# Patient Record
Sex: Female | Born: 1935 | Race: White | Hispanic: No | Smoking: Former smoker
Health system: Southern US, Community
[De-identification: ages and names within clinical notes are randomized; demographics above are authoritative.]

## PROBLEM LIST (undated history)

## (undated) DIAGNOSIS — Z789 Other specified health status: Secondary | ICD-10-CM

## (undated) DIAGNOSIS — I2510 Atherosclerotic heart disease of native coronary artery without angina pectoris: Secondary | ICD-10-CM

## (undated) DIAGNOSIS — M5135 Other intervertebral disc degeneration, thoracolumbar region: Secondary | ICD-10-CM

## (undated) DIAGNOSIS — T82855A Stenosis of coronary artery stent, initial encounter: Secondary | ICD-10-CM

## (undated) DIAGNOSIS — T4145XA Adverse effect of unspecified anesthetic, initial encounter: Secondary | ICD-10-CM

## (undated) DIAGNOSIS — J45909 Unspecified asthma, uncomplicated: Secondary | ICD-10-CM

## (undated) DIAGNOSIS — K648 Other hemorrhoids: Secondary | ICD-10-CM

## (undated) DIAGNOSIS — M1990 Unspecified osteoarthritis, unspecified site: Secondary | ICD-10-CM

## (undated) DIAGNOSIS — C189 Malignant neoplasm of colon, unspecified: Secondary | ICD-10-CM

## (undated) DIAGNOSIS — K5790 Diverticulosis of intestine, part unspecified, without perforation or abscess without bleeding: Secondary | ICD-10-CM

## (undated) DIAGNOSIS — E785 Hyperlipidemia, unspecified: Secondary | ICD-10-CM

## (undated) DIAGNOSIS — M810 Age-related osteoporosis without current pathological fracture: Secondary | ICD-10-CM

## (undated) DIAGNOSIS — R918 Other nonspecific abnormal finding of lung field: Secondary | ICD-10-CM

## (undated) DIAGNOSIS — D126 Benign neoplasm of colon, unspecified: Secondary | ICD-10-CM

## (undated) DIAGNOSIS — I10 Essential (primary) hypertension: Secondary | ICD-10-CM

## (undated) DIAGNOSIS — Z9861 Coronary angioplasty status: Secondary | ICD-10-CM

## (undated) DIAGNOSIS — I2121 ST elevation (STEMI) myocardial infarction involving left circumflex coronary artery: Secondary | ICD-10-CM

## (undated) DIAGNOSIS — D500 Iron deficiency anemia secondary to blood loss (chronic): Secondary | ICD-10-CM

## (undated) DIAGNOSIS — IMO0001 Reserved for inherently not codable concepts without codable children: Secondary | ICD-10-CM

## (undated) HISTORY — DX: Diverticulosis of intestine, part unspecified, without perforation or abscess without bleeding: K57.90

## (undated) HISTORY — DX: Other hemorrhoids: K64.8

## (undated) HISTORY — DX: Other specified health status: Z78.9

## (undated) HISTORY — DX: Other nonspecific abnormal finding of lung field: R91.8

## (undated) HISTORY — PX: CHOLECYSTECTOMY: SHX55

## (undated) HISTORY — DX: Atherosclerotic heart disease of native coronary artery without angina pectoris: I25.10

## (undated) HISTORY — PX: APPENDECTOMY: SHX54

## (undated) HISTORY — DX: Iron deficiency anemia secondary to blood loss (chronic): D50.0

## (undated) HISTORY — DX: Reserved for inherently not codable concepts without codable children: IMO0001

## (undated) HISTORY — DX: Stenosis of coronary artery stent, initial encounter: T82.855A

## (undated) HISTORY — DX: Unspecified osteoarthritis, unspecified site: M19.90

## (undated) HISTORY — DX: Hyperlipidemia, unspecified: E78.5

## (undated) HISTORY — DX: Atherosclerotic heart disease of native coronary artery without angina pectoris: Z98.61

## (undated) HISTORY — DX: ST elevation (STEMI) myocardial infarction involving left circumflex coronary artery: I21.21

## (undated) HISTORY — DX: Unspecified asthma, uncomplicated: J45.909

## (undated) HISTORY — PX: CORONARY ANGIOPLASTY WITH STENT PLACEMENT: SHX49

## (undated) HISTORY — DX: Malignant neoplasm of colon, unspecified: C18.9

## (undated) HISTORY — DX: Benign neoplasm of colon, unspecified: D12.6

## (undated) HISTORY — DX: Age-related osteoporosis without current pathological fracture: M81.0

## (undated) HISTORY — DX: Essential (primary) hypertension: I10

## (undated) HISTORY — DX: Other intervertebral disc degeneration, thoracolumbar region: M51.35

---

## 1998-02-03 ENCOUNTER — Other Ambulatory Visit: Admission: RE | Admit: 1998-02-03 | Discharge: 1998-02-03 | Payer: Self-pay | Admitting: Gynecology

## 1998-07-22 ENCOUNTER — Ambulatory Visit (HOSPITAL_BASED_OUTPATIENT_CLINIC_OR_DEPARTMENT_OTHER): Admission: RE | Admit: 1998-07-22 | Discharge: 1998-07-22 | Payer: Self-pay | Admitting: Orthopedic Surgery

## 1998-08-02 ENCOUNTER — Encounter: Admission: RE | Admit: 1998-08-02 | Discharge: 1998-10-31 | Payer: Self-pay | Admitting: Orthopedic Surgery

## 1999-02-16 ENCOUNTER — Other Ambulatory Visit: Admission: RE | Admit: 1999-02-16 | Discharge: 1999-02-16 | Payer: Self-pay | Admitting: Obstetrics & Gynecology

## 1999-02-16 ENCOUNTER — Encounter (INDEPENDENT_AMBULATORY_CARE_PROVIDER_SITE_OTHER): Payer: Self-pay | Admitting: Specialist

## 1999-03-03 ENCOUNTER — Other Ambulatory Visit: Admission: RE | Admit: 1999-03-03 | Discharge: 1999-03-03 | Payer: Self-pay | Admitting: Gastroenterology

## 1999-03-03 ENCOUNTER — Encounter (INDEPENDENT_AMBULATORY_CARE_PROVIDER_SITE_OTHER): Payer: Self-pay

## 1999-04-25 ENCOUNTER — Encounter: Admission: RE | Admit: 1999-04-25 | Discharge: 1999-04-25 | Payer: Self-pay | Admitting: Orthopedic Surgery

## 1999-04-25 ENCOUNTER — Encounter: Payer: Self-pay | Admitting: Orthopedic Surgery

## 1999-04-26 ENCOUNTER — Ambulatory Visit (HOSPITAL_BASED_OUTPATIENT_CLINIC_OR_DEPARTMENT_OTHER): Admission: RE | Admit: 1999-04-26 | Discharge: 1999-04-26 | Payer: Self-pay | Admitting: Orthopedic Surgery

## 1999-06-05 ENCOUNTER — Encounter: Admission: RE | Admit: 1999-06-05 | Discharge: 1999-06-21 | Payer: Self-pay | Admitting: Orthopedic Surgery

## 2000-03-06 ENCOUNTER — Other Ambulatory Visit: Admission: RE | Admit: 2000-03-06 | Discharge: 2000-03-06 | Payer: Self-pay | Admitting: Obstetrics & Gynecology

## 2001-01-01 ENCOUNTER — Inpatient Hospital Stay (HOSPITAL_COMMUNITY): Admission: EM | Admit: 2001-01-01 | Discharge: 2001-01-03 | Payer: Self-pay | Admitting: Emergency Medicine

## 2001-01-01 ENCOUNTER — Encounter: Payer: Self-pay | Admitting: Emergency Medicine

## 2001-01-06 ENCOUNTER — Inpatient Hospital Stay (HOSPITAL_COMMUNITY): Admission: EM | Admit: 2001-01-06 | Discharge: 2001-01-09 | Payer: Self-pay | Admitting: Emergency Medicine

## 2001-01-06 ENCOUNTER — Encounter: Payer: Self-pay | Admitting: Emergency Medicine

## 2001-03-02 ENCOUNTER — Inpatient Hospital Stay (HOSPITAL_COMMUNITY): Admission: EM | Admit: 2001-03-02 | Discharge: 2001-03-05 | Payer: Self-pay | Admitting: Emergency Medicine

## 2001-03-02 ENCOUNTER — Encounter: Payer: Self-pay | Admitting: Emergency Medicine

## 2001-03-02 DIAGNOSIS — I2121 ST elevation (STEMI) myocardial infarction involving left circumflex coronary artery: Secondary | ICD-10-CM

## 2001-03-02 HISTORY — DX: ST elevation (STEMI) myocardial infarction involving left circumflex coronary artery: I21.21

## 2001-03-04 ENCOUNTER — Encounter: Payer: Self-pay | Admitting: Cardiology

## 2001-04-17 ENCOUNTER — Encounter: Payer: Self-pay | Admitting: Family Medicine

## 2001-04-17 ENCOUNTER — Encounter: Admission: RE | Admit: 2001-04-17 | Discharge: 2001-04-17 | Payer: Self-pay | Admitting: Family Medicine

## 2001-07-02 HISTORY — PX: REPLACEMENT TOTAL KNEE: SUR1224

## 2001-07-02 HISTORY — PX: TOTAL HIP ARTHROPLASTY: SHX124

## 2001-07-21 ENCOUNTER — Encounter: Payer: Self-pay | Admitting: Orthopedic Surgery

## 2001-07-21 ENCOUNTER — Inpatient Hospital Stay (HOSPITAL_COMMUNITY): Admission: RE | Admit: 2001-07-21 | Discharge: 2001-07-25 | Payer: Self-pay | Admitting: Orthopedic Surgery

## 2002-02-02 ENCOUNTER — Inpatient Hospital Stay (HOSPITAL_COMMUNITY): Admission: RE | Admit: 2002-02-02 | Discharge: 2002-02-06 | Payer: Self-pay | Admitting: Orthopedic Surgery

## 2002-05-02 DIAGNOSIS — T82855A Stenosis of coronary artery stent, initial encounter: Secondary | ICD-10-CM

## 2002-05-02 HISTORY — DX: Stenosis of coronary artery stent, initial encounter: T82.855A

## 2002-05-22 ENCOUNTER — Ambulatory Visit (HOSPITAL_COMMUNITY): Admission: RE | Admit: 2002-05-22 | Discharge: 2002-05-23 | Payer: Self-pay | Admitting: Cardiology

## 2002-05-22 ENCOUNTER — Encounter: Payer: Self-pay | Admitting: Cardiology

## 2002-10-20 ENCOUNTER — Ambulatory Visit (HOSPITAL_COMMUNITY): Admission: RE | Admit: 2002-10-20 | Discharge: 2002-10-20 | Payer: Self-pay | Admitting: Cardiology

## 2003-06-22 ENCOUNTER — Other Ambulatory Visit: Admission: RE | Admit: 2003-06-22 | Discharge: 2003-06-22 | Payer: Self-pay | Admitting: Obstetrics & Gynecology

## 2003-07-08 ENCOUNTER — Emergency Department (HOSPITAL_COMMUNITY): Admission: EM | Admit: 2003-07-08 | Discharge: 2003-07-08 | Payer: Self-pay

## 2003-11-09 ENCOUNTER — Emergency Department (HOSPITAL_COMMUNITY): Admission: EM | Admit: 2003-11-09 | Discharge: 2003-11-09 | Payer: Self-pay | Admitting: Family Medicine

## 2003-12-24 ENCOUNTER — Emergency Department (HOSPITAL_COMMUNITY): Admission: EM | Admit: 2003-12-24 | Discharge: 2003-12-24 | Payer: Self-pay | Admitting: Emergency Medicine

## 2004-01-18 ENCOUNTER — Emergency Department (HOSPITAL_COMMUNITY): Admission: EM | Admit: 2004-01-18 | Discharge: 2004-01-18 | Payer: Self-pay | Admitting: *Deleted

## 2005-03-29 ENCOUNTER — Ambulatory Visit: Payer: Self-pay | Admitting: Gastroenterology

## 2005-04-17 ENCOUNTER — Ambulatory Visit: Payer: Self-pay | Admitting: Gastroenterology

## 2005-04-17 ENCOUNTER — Encounter (INDEPENDENT_AMBULATORY_CARE_PROVIDER_SITE_OTHER): Payer: Self-pay | Admitting: *Deleted

## 2005-06-26 ENCOUNTER — Other Ambulatory Visit: Admission: RE | Admit: 2005-06-26 | Discharge: 2005-06-26 | Payer: Self-pay | Admitting: Obstetrics & Gynecology

## 2006-02-23 ENCOUNTER — Emergency Department (HOSPITAL_COMMUNITY): Admission: EM | Admit: 2006-02-23 | Discharge: 2006-02-23 | Payer: Self-pay | Admitting: Family Medicine

## 2007-06-03 ENCOUNTER — Encounter: Admission: RE | Admit: 2007-06-03 | Discharge: 2007-06-03 | Payer: Self-pay | Admitting: Orthopedic Surgery

## 2007-07-03 DIAGNOSIS — T8859XA Other complications of anesthesia, initial encounter: Secondary | ICD-10-CM

## 2007-07-03 HISTORY — DX: Other complications of anesthesia, initial encounter: T88.59XA

## 2007-09-03 ENCOUNTER — Inpatient Hospital Stay (HOSPITAL_COMMUNITY): Admission: RE | Admit: 2007-09-03 | Discharge: 2007-09-07 | Payer: Self-pay | Admitting: Orthopedic Surgery

## 2007-09-08 HISTORY — PX: TOTAL HIP ARTHROPLASTY: SHX124

## 2007-11-24 ENCOUNTER — Emergency Department (HOSPITAL_COMMUNITY): Admission: EM | Admit: 2007-11-24 | Discharge: 2007-11-24 | Payer: Self-pay | Admitting: Emergency Medicine

## 2008-06-01 ENCOUNTER — Ambulatory Visit: Payer: Self-pay | Admitting: Internal Medicine

## 2008-06-03 ENCOUNTER — Emergency Department (HOSPITAL_COMMUNITY): Admission: EM | Admit: 2008-06-03 | Discharge: 2008-06-03 | Payer: Self-pay | Admitting: Emergency Medicine

## 2008-06-15 ENCOUNTER — Encounter: Payer: Self-pay | Admitting: Internal Medicine

## 2008-06-15 ENCOUNTER — Ambulatory Visit: Payer: Self-pay | Admitting: Internal Medicine

## 2008-06-15 HISTORY — PX: COLONOSCOPY W/ BIOPSIES AND POLYPECTOMY: SHX1376

## 2008-06-18 ENCOUNTER — Encounter: Payer: Self-pay | Admitting: Internal Medicine

## 2008-06-27 ENCOUNTER — Emergency Department (HOSPITAL_COMMUNITY): Admission: EM | Admit: 2008-06-27 | Discharge: 2008-06-27 | Payer: Self-pay | Admitting: Emergency Medicine

## 2009-11-22 ENCOUNTER — Telehealth: Payer: Self-pay | Admitting: Internal Medicine

## 2009-11-29 ENCOUNTER — Ambulatory Visit: Payer: Self-pay | Admitting: Gastroenterology

## 2009-11-29 ENCOUNTER — Telehealth: Payer: Self-pay | Admitting: Internal Medicine

## 2009-11-29 DIAGNOSIS — K5900 Constipation, unspecified: Secondary | ICD-10-CM | POA: Insufficient documentation

## 2009-11-29 DIAGNOSIS — Z860100 Personal history of colon polyps, unspecified: Secondary | ICD-10-CM | POA: Insufficient documentation

## 2009-11-30 LAB — CONVERTED CEMR LAB
Basophils Absolute: 0 10*3/uL (ref 0.0–0.1)
Basophils Relative: 0.4 % (ref 0.0–3.0)
Eosinophils Absolute: 0.2 10*3/uL (ref 0.0–0.7)
HCT: 36.2 % (ref 36.0–46.0)
Hemoglobin: 12.8 g/dL (ref 12.0–15.0)
Lymphs Abs: 2.2 10*3/uL (ref 0.7–4.0)
MCHC: 35.3 g/dL (ref 30.0–36.0)
MCV: 92 fL (ref 78.0–100.0)
Neutro Abs: 5.9 10*3/uL (ref 1.4–7.7)
RBC: 3.93 M/uL (ref 3.87–5.11)
RDW: 13.1 % (ref 11.5–14.6)

## 2009-12-06 ENCOUNTER — Telehealth: Payer: Self-pay | Admitting: Internal Medicine

## 2009-12-06 ENCOUNTER — Ambulatory Visit: Payer: Self-pay | Admitting: Internal Medicine

## 2009-12-06 LAB — CONVERTED CEMR LAB
BUN: 16 mg/dL (ref 6–23)
Basophils Relative: 0.1 % (ref 0.0–3.0)
Calcium: 10.3 mg/dL (ref 8.4–10.5)
Chloride: 101 meq/L (ref 96–112)
Creatinine, Ser: 1.1 mg/dL (ref 0.4–1.2)
Eosinophils Absolute: 0.2 10*3/uL (ref 0.0–0.7)
Eosinophils Relative: 1.5 % (ref 0.0–5.0)
Lymphocytes Relative: 19.1 % (ref 12.0–46.0)
MCHC: 35 g/dL (ref 30.0–36.0)
MCV: 91.7 fL (ref 78.0–100.0)
Monocytes Absolute: 0.7 10*3/uL (ref 0.1–1.0)
Neutrophils Relative %: 73.3 % (ref 43.0–77.0)
Platelets: 314 10*3/uL (ref 150.0–400.0)
RBC: 4.09 M/uL (ref 3.87–5.11)
WBC: 11.4 10*3/uL — ABNORMAL HIGH (ref 4.5–10.5)

## 2009-12-07 ENCOUNTER — Ambulatory Visit: Payer: Self-pay | Admitting: Cardiovascular Disease

## 2010-07-23 ENCOUNTER — Encounter: Payer: Self-pay | Admitting: Internal Medicine

## 2010-08-03 NOTE — Progress Notes (Signed)
Summary: Triage  Phone Note Call from Patient Call back at Home Phone [phone]   Caller: Patient Call For: Dr. Leone Payor Reason for Call: Talk to Nurse Summary of Call: pt. feels like she has a "blocked intestine"...complete constipation Initial call taken by: Karna Christmas,  Nov 29, 2009 9:22 AM  Follow-up for Phone Call        Patient  with increasing constipation and doesn't fel she can wait to see Dr Leone Payor on 12/26/09.  She also has worsening LLQ abdominal pain.  Patient  will come in today and see Amy Esterwood PA toay at 2:30. Follow-up by: Darcey Nora RN, CGRN,  Nov 29, 2009 9:38 AM

## 2010-08-03 NOTE — Assessment & Plan Note (Signed)
Summary: continued abdominal pain/diverticulitis/sheri   History of Present Illness Visit Type: Follow-up Visit Primary GI MD: Stan Head MD [facility] Primary Provider: Merlene Laughter MD Chief Complaint: constipation, pt has been taking Miralax and she is still unable to have a good BM History of Present Illness:   77 Y.O FEMALE KNOWN TO DR. Leone Payor WHO WAS SEEN ON 11/29/09 WITH C/O LLQ PAIN AND OBSTIPATION. SHE HAD A COLONOSCOPY IN 2009 THAT SHOWED SEVERE SIGMOID DIVERTICULOSIS,AND 3 SMALL ADENMATOUS POLYPS. SHE WAS  FELT CLINICALLY TO HAVE DIVERTICULITIS, AND WAS STARTED ON CIPRO AND FLAGYL ,AS WELL AS MIRALAX DAILY. CBC WAS NORMAL. SHE CALLED BACK TODAY STATING THAT SHE DOES NOT FEEL ANY BETTER. SHE IS STILL HHURTING IN HER LOWER ABDOMEN-PRIMARILY LEFT SIDED. SHE IS PASSING SOME SMALL VOLUME MUSHY STOOL BUT STILL FEELS SHE IS CONSTIPATED. SHE SAYS SHE FEELS MISERABLE IN THE MORNINGS,WITH NAUSEA AND URGE FOR BM-THEN JUST PASSING SMALL AMTS. NO FEVER, SWEATS. SHE IS TAKING HER ABX.SHE IS ALSO HAVING SOME LEFT BACK PAIN.   GI Review of Systems    Reports abdominal pain, bloating, and  loss of appetite.     Location of  Abdominal pain: LLQ.    Denies acid reflux, belching, chest pain, dysphagia with liquids, dysphagia with solids, heartburn, nausea, vomiting, vomiting blood, and  weight loss.      Reports change in bowel habits, constipation, and  diverticulosis.     Denies anal fissure, black tarry stools, fecal incontinence, heme positive stool, hemorrhoids, irritable bowel syndrome, jaundice, light color stool, liver problems, rectal bleeding, and  rectal pain.    Current Medications (verified): 1)  Fexofenadine Hcl 180 Mg Tabs (Fexofenadine Hcl) .[name] Kitchen.. 1 By Mouth Once Daily 2)  Lovastatin 20 Mg Tabs (Lovastatin) .[name] Kitchen.. 1 By Mouth Once Daily 3)  Losartan Potassium 25 Mg Tabs (Losartan Potassium) .[name] Kitchen.. 1 By Mouth Once Daily 4)  Metoprolol Tartrate 50 Mg Tabs (Metoprolol Tartrate) .[name] Kitchen.. 1 By Mouth Once  Daily 5)  Triamterene-Hctz 37.5-25 Mg Tabs (Triamterene-Hctz) .[name] Kitchen.. 1 By Mouth Once Daily 6)  Alendronate Sodium 70 Mg Tabs (Alendronate Sodium) .[name] Kitchen.. 1 By Mouth Per Week 7)  Proventil Hfa 108 (90 Base) Mcg/act Aers (Albuterol Sulfate) .... As Needed 8)  Aspir-Low 81 Mg Tbec (Aspirin) .[name] Kitchen.. 1 By Mouth Once Daily 9)  Calcium Gluconate 500 Mg Tabs (Calcium Gluconate) .... 2 By Mouth Once Daily 10)  Fish Oil 1200 Mg Caps (Omega-3 Fatty Acids) .[name] Kitchen.. 1 By Mouth Once Daily 11)  Tylenol 325 Mg Tabs (Acetaminophen) .... As Needed 12)  Cipro 500 Mg Tabs (Ciprofloxacin Hcl) .... Take 1 Tab Twice Daily X 10 Days 13)  Flagyl 500 Mg Tabs (Metronidazole) .... Take 1 Tab Twice Daily X 10 Days  Allergies (verified): 1)  ! Plavix (Clopidogrel Bisulfate) 2)  ! Lipitor 3)  Sulfa 4)  Codeine  Past History:  Past Medical History: Reviewed history from 11/29/2009 and no changes required. Arthritis Asthma Coronary Artery Disease ADENOMATOUS COLON POLYPS DIVERTICULOSIS Hyperlipidemia Obesity  Past Surgical History: Reviewed history from 11/29/2009 and no changes required. Appendectomy Cholecystectomy PTCA-Stent  Family History: Reviewed history from 11/29/2009 and no changes required. No FH of Colon Cancer: Family History of Pancreatic Cancer: father brothers  Family History of Heart Disease: mother sister  Social History: Reviewed history from 11/29/2009 and no changes required. Patient is a former smoker.  Alcohol Use - yes 1-2 per month Daily Caffeine Use 1-2 per day Illicit Drug Use - no Patient does not get regular exercise.   Review  of Systems       The patient complains of back pain.  The patient denies allergy/sinus, anemia, anxiety-new, arthritis/joint pain, blood in urine, breast changes/lumps, change in vision, confusion, cough, coughing up blood, depression-new, fainting, fatigue, fever, headaches-new, hearing problems, heart murmur, heart rhythm changes, itching, menstrual pain,  muscle pains/cramps, nosebleeds, pregnancy symptoms, shortness of breath, sore throat, swelling of feet/legs, swollen lymph glands, thirst - excessive, urination - excessive, and urination changes/pain.         OTHERWISE AS IN HPI  Vital Signs:  Patient profile:   77 year old female Height:      60 inches Weight:      178 pounds BMI:     34.89 Pulse rate:   88 / minute Pulse rhythm:   regular BP sitting:   120 / 72  (left arm) Cuff size:   regular  Vitals Entered By: Francee Piccolo CMA Duncan Dull) (December 06, 2009 2:15 PM)  Physical Exam  General:  Well developed, well nourished, no acute distress. Head:  Normocephalic and atraumatic. Eyes:  PERRLA, no icterus. Lungs:  Clear throughout to auscultation. Heart:  Regular rate and rhythm; no murmurs, rubs,  or bruits. Abdomen:  SOFT, TENDER LLQ AND SUPRAPUBIC AREA, NO MASS NO GUARDING, BS+ Rectal:  NOT REPEATED Extremities:  No clubbing, cyanosis, edema or deformities noted. Neurologic:  Alert and  oriented x4;  grossly normal neurologically. Psych:  Alert and cooperative. Normal mood and affect.   Impression & Recommendations:  Problem # 1:  DIVERTICULITIS, COLON (ICD-562.11) Assessment Unchanged 77 YO FEMALE WITH PERSISTENT LLQ PAIN, AND CONSTIPATION AFTER ONE WEEK TREATMENT FOR DIVERTICULITIS WITH FLAGYL/CIPRO AND DAILY MIRALAX. R/O REFRACTORY DIVERTICULITIS,COMPLICATED DIVERTICULITIS,OR OTHER INFLAMMATORY PROCESS.   SCHEDULE FOR CT SCAN ABD/PELVIS FINISH CURRENT COURSE OF CIPRO AND FLAGYL X 3 MORE DAYS INCREASE MIRALAX TO 17 GM TWICE DAILY IN 8 OZ WATER CBC/BMET TODAY FOLLOW UP WITH DR. Leone Payor  AS PREVIOUSLY SCHEDULED. Orders: TLB-BMP (Basic Metabolic Panel-BMET) (80048-METABOL)  Problem # 2:  HYPERTENSION (ICD-401.9) Assessment: Comment Only  Problem # 3:  PERSONAL HX COLONIC POLYPS (ICD-V12.72) Assessment: Comment Only LAST COLON 2009-DUE FOR FOLLOW UP 2012  Problem # 4:  CORONARY ARTERY DISEASE  (ICD-414.00) Assessment: Comment Only  Other Orders: TLB-CBC Platelet - w/Differential (85025-CBCD) CT Abdomen/Pelvis with Contrast (CT Abd/Pelvis w/con)  Patient Instructions: 1)  Please go to lab, basement level. 2)  We scheduled the CT Scan at [facility] for tomorrow 12-07-09 at [facility] [address]... 3)  Directions and contrast given. 4)  Continue the Cipro and Flagyl.  5)  Increase the Mirilax to  1 dose twice daily. 6)  Copy sent to : Merlene Laughter, MD 7)  The medication list was reviewed and reconciled.  All changed / newly prescribed medications were explained.  A complete medication list was provided to the patient / caregiver.

## 2010-08-03 NOTE — Assessment & Plan Note (Signed)
Summary: constipation/abdominal pain/sheri   History of Present Illness Visit Type: Initial Visit Primary GI MD: Stan Head MD [facility] Primary Provider: Merlene Laughter MD Chief Complaint: constipation abd pain History of Present Illness:   PLEASANT 77 Y.O FEMALE KNOWN TO DR. Leone Payor WHO HAD A COLONOSCOPY IN 2009. THIS SHOWED SEVERE SIGMOID DIVERTICULOSIS, 3 SMALL ADENOMATOUS POLYPS. SHE COMES IN TODAY WITH NEW C/O CONSTIPATION OVER THE PAST COUPLE MONTHS,NOW TO THE POINT SHE HAS NOT REALLY HAD A NORMAL BM IN OVER A WEEK.SHE IS PASSING SOME LIQUID SQUIRTS OF STOOL. SHE BEGAN WITH LEFT LOWR ABDOMINAL PAIN ABOUT A WEEK AGO WHICH HAS BEEN CONSTANT,WORSE WITH STRAINING FOR A BM,EASES OFF WITH FLATUS ETC. NO FEVER, NO MELENA OR HEME. NO URINARY SXS. SHE HAS TRIED A COUPLE LAXATIVES THIS LAST WEEK WITHOUT SUCCESS.NO N/V.    GI Review of Systems    Reports abdominal pain.     Location of  Abdominal pain: LLQ.    Denies acid reflux, belching, bloating, chest pain, dysphagia with liquids, dysphagia with solids, heartburn, loss of appetite, nausea, vomiting, vomiting blood, and  weight loss.        Denies anal fissure, black tarry stools, change in bowel habit, constipation, diarrhea, diverticulosis, fecal incontinence, heme positive stool, hemorrhoids, irritable bowel syndrome, jaundice, light color stool, liver problems, rectal bleeding, and  rectal pain.    Current Medications (verified): 1)  Fexofenadine Hcl 180 Mg Tabs (Fexofenadine Hcl) .[name] Kitchen.. 1 By Mouth Once Daily 2)  Lovastatin 20 Mg Tabs (Lovastatin) .[name] Kitchen.. 1 By Mouth Once Daily 3)  Losartan Potassium 25 Mg Tabs (Losartan Potassium) .[name] Kitchen.. 1 By Mouth Once Daily 4)  Metoprolol Tartrate 50 Mg Tabs (Metoprolol Tartrate) .[name] Kitchen.. 1 By Mouth Once Daily 5)  Triamterene-Hctz 37.5-25 Mg Tabs (Triamterene-Hctz) .[name] Kitchen.. 1 By Mouth Once Daily 6)  Alendronate Sodium 70 Mg Tabs (Alendronate Sodium) .[name] Kitchen.. 1 By Mouth Per Week 7)  Proventil Hfa 108 (90 Base) Mcg/act Aers  (Albuterol Sulfate) .... As Needed 8)  Aspir-Low 81 Mg Tbec (Aspirin) .[name] Kitchen.. 1 By Mouth Once Daily 9)  Calcium Gluconate 500 Mg Tabs (Calcium Gluconate) .... 2 By Mouth Once Daily 10)  Fish Oil 1200 Mg Caps (Omega-3 Fatty Acids) .[name] Kitchen.. 1 By Mouth Once Daily 11)  Tylenol 325 Mg Tabs (Acetaminophen) .... As Needed  Allergies (verified): 1)  Sulfa 2)  Codeine  Past History:  Past Medical History: Arthritis Asthma Coronary Artery Disease ADENOMATOUS COLON POLYPS DIVERTICULOSIS Hyperlipidemia Obesity  Past Surgical History: Appendectomy Cholecystectomy PTCA-Stent  Family History: No FH of Colon Cancer: Family History of Pancreatic Cancer: father brothers  Family History of Heart Disease: mother sister  Social History: Patient is a former smoker.  Alcohol Use - yes 1-2 per month Daily Caffeine Use 1-2 per day Illicit Drug Use - no Patient does not get regular exercise.  Smoking Status:  quit Drug Use:  no Does Patient Exercise:  no  Review of Systems  The patient denies allergy/sinus, anemia, anxiety-new, arthritis/joint pain, back pain, blood in urine, breast changes/lumps, cough, coughing up blood, depression-new, fainting, fatigue, fever, headaches-new, hearing problems, heart murmur, heart rhythm changes, itching, nosebleeds, pregnancy symptoms, shortness of breath, skin rash, sleeping problems, sore throat, swelling of feet/legs, swollen lymph glands, thirst - excessive, urination - excessive, urination changes/pain, urine leakage, vision changes, and voice change.         ROS OTHERWISE AS IN HPI  Vital Signs:  Patient profile:   77 year old female Height:      60 inches Weight:  180 pounds BMI:     35.28 Pulse rate:   68 / minute Pulse rhythm:   regular BP sitting:   110 / 60  (right arm)  Vitals Entered By: Chales Abrahams CMA Duncan Dull) (Nov 29, 2009 2:09 PM)  Physical Exam  General:  Well developed, well nourished, no acute distress. Head:  Normocephalic and  atraumatic. Eyes:  PERRLA, no icterus. Lungs:  Clear throughout to auscultation. Heart:  Regular rate and rhythm; no murmurs, rubs,  or bruits.heart murmur systolic:.   Abdomen:  SOFT, TENDER LLQ, NO MASS OR HSM,BS+, NO GUARDING OR REBOUND Rectal:  MINIMAL STOOL IN VAULT,HEME NEGATIVE  Extremities:  No clubbing, cyanosis, edema or deformities noted. Neurologic:  Alert and  oriented x4;  grossly normal neurologically. Psych:  Alert and cooperative. Normal mood and affect.   Impression & Recommendations:  Problem # 1:  DIVERTICULITIS, COLON (ICD-562.11) Assessment New 77 Y.O FEMALE WITH ONE WEEK HX OF LLQ PAIN, AND SEVERAL WEEK HX OF ALTERED BOWEL HABITS WITH CONSTIPATION. SXS ARE CONSISTENT WITH DIVERTICULITIS,R/O SOME COMPONENT OF SIGMOID NARROWING CAUSING CONSTIPATION.  LABS AS BELOW START CIPRO 500 MG TWICE DAILY X 10 DAYS FLAGYL 500 MG TWICE DAILY X 10 DAYS START MIRALAX 17 GM IN 8 OZ OF WATER DAILY ROV WITH DR. Leone Payor IN 2-3 WEEKS. PT ADVISED TO CALL IF SXS WORSEN OR IF NOT RESOLVED  WHEN ABX COMPLETED.  Problem # 2:  PERSONAL HX COLONIC POLYPS (ICD-V12.72) Assessment: Comment Only ADENOMATOUS ON COLONOSCOPY12/09.  Problem # 3:  CORONARY ARTERY DISEASE (ICD-414.00) Assessment: Comment Only  Problem # 4:  HYPERLIPIDEMIA (ICD-272.4) Assessment: Comment Only  Other Orders: TLB-CBC Platelet - w/Differential (85025-CBCD)  Patient Instructions: 1)  Your physician has requested that you have the following labwork done today: Go to basement level. 2)  We sent perscription for Cipro and Flagyl to your pharmacy, [facility]. 3)  Use Miralax, 1 dose daily, 17 grams. 4)  We made you a follow appointment with Dr. Leone Payor for 01-05-10 at 2:30 PM.  5)  cc: Hal Stoneking, Md Prescriptions: FLAGYL 500 MG TABS (METRONIDAZOLE) Take 1 tab twice daily x 10 days  #20 x 0   Entered by:   Lowry Ram NCMA   Authorized by:   Sammuel Cooper PA-c   Signed by:   Lowry Ram NCMA on  11/29/2009   Method used:   Electronically to        [facility] # [phone]* (retail)       [address]       [place], [place]  [address]       Ph: [phone]       Fax: [phone]   RxID:   [id number] CIPRO 500 MG TABS (CIPROFLOXACIN HCL) Take 1 tab twice daily x 10 days  #20 x 0   Entered by:   Lowry Ram NCMA   Authorized by:   Sammuel Cooper PA-c   Signed by:   Lowry Ram NCMA on 11/29/2009   Method used:   Electronically to        [facility] # [phone]* (retail)       [address]       [place], [place]  [address]       Ph: [phone]       Fax: [phone]   RxID:   [id number]

## 2010-08-03 NOTE — Progress Notes (Signed)
Summary: triage  Phone Note Call from Patient Call back at Home Phone [phone]   Caller: Patient Call For: Leone Payor Reason for Call: Talk to Nurse Summary of Call: Patient has flare up diverticulitis  she was seen last week and given meds but she states that her symptoms are just getting worse. Initial call taken by: Tawni Levy,  December 06, 2009 8:10 AM  Follow-up for Phone Call        Patient  having worsening pain , she was treated last week with cipro and flagyl for diverticulitis. This am she has pain radiating down her left leg and around to her back with new nausea.   She was asked to call if her symptoms haven't improved  she  will come in and see Mike Gip PA  at 2:00 today  Follow-up by: Darcey Nora RN, CGRN,  December 06, 2009 9:04 AM  Additional Follow-up for Phone Call Additional follow up Details #1::        ok Additional Follow-up by: Iva Boop MD, Clementeen Graham,  December 06, 2009 1:46 PM

## 2010-08-03 NOTE — Progress Notes (Signed)
Summary: speak ot nurse  Phone Note Call from Patient Call back at Home Phone [phone]   Caller: Patient Call For: Leone Payor Reason for Call: Talk to Nurse Summary of Call: Patient would like to speak to nurse regarding problems she is having with her stools. Initial call taken by: Tawni Levy,  Nov 22, 2009 3:20 PM  Follow-up for Phone Call        6 month hx of change in bowel habits, thin stools.  I have rescheduled patient to 12/26/09 2:30 Follow-up by: Darcey Nora RN, CGRN,  Nov 22, 2009 3:47 PM

## 2010-11-14 NOTE — Op Note (Signed)
[name], [name]                ACCOUNT NO.:  [id number]   MEDICAL RECORD NO.:  [id number]          PATIENT TYPE:  INP   LOCATION:  0008                         FACILITY:  [facility]   PHYSICIAN:  Ollen Gross, M.D.    DATE OF BIRTH:  01/18/1928   DATE OF PROCEDURE:  09/03/2007  DATE OF DISCHARGE:                               OPERATIVE REPORT   PREOPERATIVE DIAGNOSIS:  Osteoarthritis, left hip.   POSTOPERATIVE DIAGNOSIS:  Osteoarthritis, left hip.   PROCEDURE:  Left total hip arthroplasty.   SURGEON:  Ollen Gross, M.D.   ASSISTANT:  Avel Peace, PA-C   ANESTHESIA:  General.   ESTIMATED BLOOD LOSS:  400 mL.   DRAIN:  Hemovac times one.   COMPLICATIONS:  None.   CONDITION:  Stable to recovery.   BRIEF CLINICAL NOTE:  [name] is a 77 year old female with end-stage  arthritis of the right hip with progressively worsening pain and  dysfunction.  She has failed nonoperative management and presents for  total hip arthroplasty.  She had a previous successful right total hip.   PROCEDURE IN DETAIL:  After successful administration of general  anesthetic, the patient was placed the right lateral decubitus position  with the left side up and held with the hip positioner.  Her left lower  extremity was isolated from perineum with plastic drapes and prepped and  draped in the usual sterile fashion.  Short posterolateral incision is  made with 10 blade through subcutaneous tissue to the level of fascia  lata which was incised in line with the skin incision.  Sciatic nerve  was palpated and protected and short external rotators isolated off the  femur.  Capsulectomy is performed and the hip is dislocated.  Center of  femoral head is marked and trial prosthesis placed such that the center  of the trial head corresponds to center of native femoral head.  Osteotomy lines marked on the femoral neck and osteotomy made with  oscillating saw.  Femoral head removed and the femur  retracted  anteriorly to gain acetabular exposure.   Acetabular retractors were placed.  Labrum and osteophytes removed.  Reaming starts at 43 mm coursing in increments of 2 to 49 mm and a 50 mm  pinnacle acetabular shell was placed in anatomic position and transfixed  with two dome screws.  The trial 32-mm neutral +4 liner was placed.   The femur was prepared with canal finder and irrigation.  Axial reaming  is performed at 13.5 mm, proximal reaming to 18D and the sleeve machined  to a small.  18 D small trial sleeve is placed with 18 x 13 stem and 36  +8 neck matching native anteversion.  32.0 head is placed and the hip is  reduced with outstanding stability.  There is full extension, full  external rotation, 70 degrees flexion, 40 degrees adduction 90 degrees  internal rotation and 90 degrees of flexion and 70 degrees of internal  rotation.  By placing the left leg on top of the right, the leg lengths  were found to be equal.  The hip was then  dislocated and all trials  removed.  Permanent apex hole eliminator is placed and permanent 32 mm  neutral +4 marathon liner is placed in the acetabular shell.  On the  femoral side we placed the permanent 18 D small sleeve and the 18 x 13  stem and 36 +8 neck matching native anteversion.  32.0 head is placed  and the hip is reduced to the same stability parameters.  Wound was  copiously irrigated with saline solution and the short external rotators  reattached to the femur through drill holes.  Fascia lata was closed  over Hemovac drain with interrupted #1 Vicryl, subcu closed with #1-0  and #2-0 Vicryl and subcuticular running 4-0 Monocryl.  The drains  hooked to suction.  Incision cleaned and dried and Steri-Strips and  bulky sterile dressing applied.  She was then placed into a knee  immobilizer, awakened and transferred to recovery in stable condition.      Ollen Gross, M.D.  Electronically Signed     FA/MEDQ  D:  09/03/2007  T:   09/04/2007  Job:  [id number]

## 2010-11-17 NOTE — H&P (Signed)
[facility]  Patient:    [name], [name] Visit Number: [id number] MRN: [id number]          Service Type: Attending:  Ollen Gross, M.D. Dictated by:   Dorie Rank, P.A. Adm. Date:  07/21/01   CC:         Valentino Hue. Magrinat, M.D.  Carolyne Fiscal, M.D.  Madaline Savage, M.D.   History and Physical  DATE OF BIRTH:  [birth year]  CHIEF COMPLAINT:  Right hip pain.  HISTORY OF PRESENT ILLNESS:  [name] is a pleasant 77 year old female with a history of severe pain in the right hip and groin for several months.  It radiates down her thigh medially.  She has had progressive difficulty with functional activities over the past several months.  She cannot tie her shoes. She has had difficulty getting in and out of cars and up and down stairs.  She is at a point where she would like to get something done about this right hip pain.  In the office on physical exam it was noted she had an antalgic gait. Range of motion to the hip revealed flexion 85 degrees, full extension, rotation internal 15 degrees, external 30 degrees, and abduction to about 30 degrees.  It was noted she had 3/8-inch shortening on the right lower extremity compared to the left while standing.  Radiographs brought to the office on May 27, 2001, revealed bone-on-bone changes.  AP pelvis taken in the office on May 27, 2001, revealed severe erosive changes to the right hip, bone-on-bone with close to 1/2-inch shortening of the right lower extremity compared to the left.  It was felt due to her ongoing pain, failure to improve with conservative treatment, as well as diagnostic studies and physical exam, she would benefit from undergoing a right total hip arthroplasty.  The risks and benefits as well as the procedure were discussed with the patient, and she agreed to proceed.  She did obtain medical clearance from her cardiologist, Dr. Chanda Busing, as well as Dr.  Darnelle Catalan.  MEDICATIONS: 1. Hydrocodone 5 mg, 1-2 p.o. q.4-6h. p.r.n. pain. 2. Ferrous sulfate 1 p.o. b.i.d. 3. Maxzide 37.5 mg/25, 1 p.o. q.a.m. 4. Allegra 1 p.o. q.d. p.r.n. 5. Caltrate 1200 mg, 1 p.o. q.d. 6. Aspirin 81 mg, 1 p.o. q.d. 7. Vitamin E 400 IU, 1 p.o. q.d. 8. Cozaar 25 mg, 1 p.o. q.a.m. 9. vitamin B 1 p.o. q.d. 10. Atenolol 50 mg, 1 p.o. q.a.m. 11. Vioxx 25 mg, 1 p.o. q.d. 12. Nitroglycerin p.r.n. chest pain.  ALLERGIES:  SULFA, PLAVIX causing itching, LIPITOR causes muscle pain.  PAST MEDICAL HISTORY:  1. History of myocardial infarct in July 2002.  2. Stent placement in September 2002.  3. Stress test in December 2002.  4. History of anemia of chronic disease.  5. Hypertension, which she states is well maintained on her current treatment     regimen.  6. History of asthma, but she states as long as she is on her Allegra, she     has no exacerbations.  FAMILY MEDICAL DOCTOR:  Dr. Mosetta Putt of [place].  CARDIOLOGIST:  Dr. Chanda Busing.  HEMATOLOGIST:  Dr. Darnelle Catalan.  SOCIAL HISTORY:  The patient is married.  She has two children.  She lives in a split-level home.  She would like a private room in the hospital.  She plans for home health physical therapy.  She denies any alcohol or tobacco use.  PAST SURGICAL HISTORY:  1. In 1948,  cholecystectomy.  2. In 1998, bladder tack.  3. In 1999, left shoulder arthroscopy.  4. In 2000, left knee arthroscopy.  FAMILY HISTORY:  Mother deceased age 80, history of heart disease.  Father deceased age 32, history of skin cancer.  REVIEW OF SYSTEMS:  No fevers, chills, night sweats, or bleeding tendencies. PULMONARY:  No shortness of breath, productive cough, or hemoptysis. CARDIOVASCULAR:  No chest pain, angina, or orthopnea.  ENDOCRINE:  No history of hypo- or hyperthyroidism.  No history of diabetes mellitus. GASTROINTESTINAL:  Constipation.  No melena, diarrhea, nausea, or vomiting. GENITOURINARY:  No  hematuria, dysuria, or discharge.  NEUROLOGIC:  No seizures, headaches, or paralysis.  PHYSICAL EXAMINATION:  GENERAL:  Alert and oriented, well-developed, well-nourished white female.  VITAL SIGNS:  Pulse 80, respirations 20, blood pressure 130/70.  HEENT:  Head atraumatic, normocephalic.  Oropharynx clear.  NECK:  Supple.  Negative for carotid bruits bilaterally.  Adenopathy, negative for cervical lymphadenopathy appreciated on exam.  LUNGS:  Clear to auscultation bilaterally.  No wheezes, rhonchi, or rales.  BREASTS:  Not pertinent to present illness.  HEART:  S1, S2.  Negative for murmur, rub, or gallop.  Regular rate and rhythm.  ABDOMEN:  Soft and nontender.  Positive bowel sounds.  Abdomen is round.  GENITOURINARY:  Not pertinent to present illness.  EXTREMITIES:  Please see history of present illness for exam to the right hip. Skin is intact.  No rashes or lesions appreciated on exam.  Dorsalis pedis pulses 1+ and symmetrical.  LABORATORY DATA:  Pending.  IMPRESSION: 1. Osteoarthritis of the right hip. 2. History of myocardial infarction, has been cleared by Dr. Elsie Lincoln for    surgery. 3. History of anemia of chronic disease.  PLAN:  The patient is scheduled for a right total hip arthroplasty by Dr. Ollen Gross. Dictated by:   Dorie Rank, P.A. Attending:  Ollen Gross, M.D. DD:  07/14/01 TD:  07/14/01 Job: [id number]

## 2010-11-17 NOTE — Discharge Summary (Signed)
[place]. [facility]  Patient:    [name], [name] Visit Number: [id number] MRN: [id number]          Service Type: MED Location: [phone] Attending Physician:  Ophelia Shoulder Dictated by:   Raymon Mutton, P.A. Admit Date:  03/02/2001 Discharge Date: 03/05/2001                             Discharge Summary  DATE OF BIRTH:  06-Aug-1927  DISCHARGE DIAGNOSES: 1. Coronary artery disease, status post subendocardial myocardial infarction    on January 01, 2001, treated with cutting balloon angioplasty to obtuse    marginal #2, reduction of lesion from 90% to 0%, and status post    percutaneous transluminal coronary angioplasty and stent to obtuse marginal    #2 on March 04, 2001, by Dr. Jenne Campus. 2. Chronic stable anemia. 3. Statin therapy for stabilization of coronary artery disease. 4. Asthma. 5. Gastritis, status post esophagogastroduodenoscopy in July 2002. 6. Diverticulosis, mild, by colonoscopy from February 20, 2001, performed by Dr.    Corinda Gubler.  MEDICATIONS: 1. Cozaar 25 mg q.d. 2. Ferrous sulfate 325 mg b.i.d. 3. Protonix 40 mg q.d. 4. Lipitor 10 mg q.d. 5. Atenolol 25 mg q.d. 6. Plavix 75 mg q.d.  HISTORY OF PRESENT ILLNESS:  [name] is a 77 year old Caucasian woman with a history of recent SEMI.  She presented to the [facility] Emergency Department with complaints of chest pain and shortness of breath, left arm and left shoulder pain, and the symptoms persisted for 2 or 3 days, but the patient could never distinguish if the pain was related to her left shoulder arthritis and discomfort of that shoulder, or if the pain was truly chest pain.  The night prior to admission, around 10:00, she started having again the left shoulder pain, but this time the pain moved to the left side of the chest and down to the left arm, and she also had shortness of breath and some burning sensation in the chest.  She took one  nitroglycerin with relief of symptoms, and went to bed.  Around 3:00 a.m. she woke up to go to the restroom and experienced the same sensation again.  She took another nitroglycerin, and it helped again.  The patient was alarmed and decided to come to the emergency room for an evaluation.  In the emergency department on presentation, her blood pressure was 147/64, pulse 70, respiratory rate 18, and she was afebrile.  Her neck did not reveal any JVD or carotid bruits.  Her lungs were clear to auscultation.  Heart revealed regular rate and rhythm with normal S1 and S2, and no murmurs, rubs, or gallops.  Abdomen had diminished bowel sounds x 4, nontender, nondistended. Extremities with no edema.  Palpable 1+ dorsalis pedis pulses bilaterally.  LABORATORY DATA:  Hemoglobin of 11.8, hematocrit 33.6.  Potassium was 3.8, creatinine 1.0.  CK was normal at 75, CK-MB was 1.6, troponin was elevated to 0.4.  Noting a history of prior presentation with subendocardial myocardial infarction in July, when she had normal CK, CK-MB, and only troponin was elevated, the decision was made to keep the patient in the hospital and continue observation of her status, and proceed with cardiac catheterization to rule out restenosis of the prior lesion.  Her EKG did not reveal any acute changes.  She was transferred from the emergency room on IV heparin drip and IV  nitroglycerin drip.  HOSPITAL COURSE AND PROCEDURES:  She was admitted to the telemetry unit in stable condition, and her enzymes showed the following results:  Second set CK 67, CK-MB 1.9, troponin 0.4.  Third set showed CK of 56, CK-MB 2.8, and troponin 0.38.  Her liver function tests were within normal limits.  Lipid panel revealed cholesterol 169, triglycerides 135, HDL 65, LDL 77.  TSH was checked and it was 1.248, which is within normal limits.  The patient underwent coronary angioplasty on March 04, 2001, which was performed by Dr. Jenne Campus.   This showed that she had a 95% proximal stenosis of the second obtuse marginal artery.  Cutting balloon angioplasty was performed and it followed by stent placement in the proximal segment of the second obtuse marginal with excellent results.  Normal left ventricular systolic function was observed, and her ejection fraction was at 55%, but there was mild anterior lateral hypokinesis.  The patient tolerated the procedure well, was transferred to the unit in a stable condition.  The next morning she was ready for discharge home, and she was instructed to follow up with Dr. Elsie Lincoln in 2 weeks, and with Dr. Duaine Dredge for assessment of anemia within the next 30 days.  ACTIVITY:  No driving, no lifting greater than 5 pounds, no strenuous physical activity for three days.  She was instructed to return to work in 3 days.  DIET:  Low fat, low cholesterol, low sodium diet.  WOUND CARE:  The patient was instructed that she could shower.  Needs to gently wash groin area with mild soap and pat it dry.  The number was provided for the patient to call with any problems such as bleeding, bruising, oozing, or swelling of the puncture site. Dictated by:   Raymon Mutton, P.A. Attending Physician:  Ophelia Shoulder DD:  03/05/01 TD:  03/05/01 Job: [id number]

## 2010-11-17 NOTE — Cardiovascular Report (Signed)
[place]. [facility]  Patient:    [name], [name]                       MRN: [id number] Proc. Date: 01/06/01 Adm. Date:  [id number] Attending:  Ophelia Shoulder CC:         Cardiac Catheterization Laboratory  Madaline Savage, M.D.  Carolyne Fiscal, M.D.  [facility], [address] [place], [place] [address]   Cardiac Catheterization  PROCEDURES PERFORMED:  Cardiac catheterization.  INDICATIONS:  The patient is a 77 year old, married white female, patient of Dr. Reino Kent, who underwent circumflex marginal PCI, January 01, 2001, for unstable angina.  She was admitted this morning with recurrent chest pain and presents now for diagnostic coronary arteriography to rule out early re-stenosis.  DESCRIPTION OF PROCEDURE:  The patient was brought to the second floor cardiac catheterization lab in the postabsorptive state.  She was premedicated with p.o. Valium.  The right groin was prepped and shaved in the usual sterile fashion.  Xylocaine 1% was used for local anesthesia.  A 6 French sheath was inserted into the right femoral artery using standard Seldinger technique.  A 6 French right and left diagnostic Judkins catheter, as well as a 6 French pigtail catheter were used for selective coronary angiography, left ventriculography, respectively.  Omnipaque dye was used for the entirety of the case.  Retrograde, aortic, left ventricular, and pullback pressures were recorded.  HEMODYNAMICS: 1. Aortic systolic pressure 123, diastolic pressure 60. 2. Left ventricular systolic pressure 121 and diastolic pressure 8.  SELECTIVE CORONARY ANGIOGRAPHY: 1. Left main:  Normal. 2. Left anterior descending:  The LAD is normal. 3. Left circumflex:  The circumflex marginal, previous PTCA site was    found to be widely patent with at most 20% stenosis and no evidence of    dissection.  There is TIMI-3 flow down this vessel. 4. Right  coronary artery:  This vessel was free of significant disease and    was dominant.  LEFT VENTRICULOGRAPHY:  The RAO and LAO left ventriculogram was performed using 20 cc of Omnipaque dye at 10 cc/sec. in each view.  The overall LVEF was estimated greater than 65% without focal wall motion abnormalities.  IMPRESSION:  The patient has essentially normal coronary arteries and normal left ventricular function.  I am unclear of the etiology of her chest pain and/or elevated troponin.  PLAN:  Plans will be to discontinue IV heparin and nitroglycerin.  The sheaths will be removed.  Pressure will be held on the groin to achieve hemostasis. The patient left the lab in stable condition.  She will need GI work-up which is in progress.  Dr. Lavonne Chick was notified of these results. DD:  01/06/01 TD:  01/06/01 Job: [id number]

## 2010-11-17 NOTE — Consult Note (Signed)
[place]. [facility]  Patient:    [name], [name]                       MRN: [id number] Proc. Date: 01/07/01 Adm. Date:  [id number] Disc. Date: [id number] Attending:  Ophelia Shoulder Dictator:   Lorette Ang, N.P. CC:         Carolyne Fiscal, M.D.  Madaline Savage, M.D.   Consultation Report  REASON FOR CONSULTATION:  Anemia.  REFERRING PHYSICIAN:  Dr. Elsie Lincoln.  HISTORY OF PRESENT ILLNESS:  [name] is a 77 year old woman who was admitted to [facility] initially on January 01, 2001, with an MI.  She underwent cardiac catheterization and was found to have small-vessel CAD, status post PTCA.  During that admission she was noted to be anemic with an admission hemoglobin of 10.9, MCV 88.7.  Her stool was also found to be positive for occult blood.  Anemia workup revealed an iron of 49, TIBC of 316, percent saturation 16, ferritin 60, B12 429, and folate of 11.8.  A GI evaluation was planned as an outpatient.  However, prior to the outpatient GI workup the patient was readmitted to [facility] on January 06, 2001, with complaints of shortness of breath and chest pain.  She underwent a second cardiac catheterization which was unrevealing as to the etiology of her chest pain.  Her admission hemoglobin at that time was 10.2.  The GI evaluation was pursued during this admission with an EGD on January 07, 2001, with findings of gastritis without hemorrhage.  The patient may undergo colonoscopy on January 09, 2001, prior to discharge home.  She is currently being transfused two units of packed red blood cells for a hemoglobin of 8.5.  Hematology consult was requested for further evaluation of a normocytic anemia.  PAST MEDICAL HISTORY:  1. MI/single-vessel CAD, status post PTCA July 2002.  2. Hypertension.  3. Asthma.  4. Normocytic anemia July 2002.  5. Osteoarthritis.  6. Seasonal allergies.  7. Bladder surgery in 1999.  8.  Arthroscopic surgery left shoulder and knee.  9. Remote history of appendectomy and cholecystectomy. 10. History of colon polyps by colonoscopy.  MEDICATIONS:  1. Cozaar 25 mg q.d.  2. Toprol XL 25 mg q.d. 3. Plavix 75 mg q.d. 4. Zocor 10 mg q.d. 5. Aspirin 325 mg q.d. 6. Protonix 40 mg q.d. 7. Restoril 15 mg q.h.s. 8. Niferex 150 mg b.i.d.  ALLERGIES:  SULFA.  CODEINE.  FAMILY HISTORY:  Mother deceased age 81 with CHF.  Father deceased age 30 with questionable melanoma.  The patient reports that she has three brothers and two sisters living and that all have "some bowel problems."  She reports one brother and one sister to have IBS and one brother to possibly have Crohns disease status post recent surgery.  She denies any family history of colon cancer.  SOCIAL HISTORY:  Mrs. Coye lives in [place] with her husband.  They have two adopted children, including one son who lives in [place] and is a Curator and one daughter who lives in [place] and is an Advertising account planner.  Both children are healthy.  They have three healthy grandsons.  The patient is retired from Recruitment consultant.  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  HEALTH MAINTENANCE:  Primary care Tanish Sinkler is Dr. Duaine Dredge.  Mammogram 2002, negative.  Pap/pelvic 2001, negative.  Colonoscopy 2-1/2 years ago with findings  of polyps and diverticulosis.  Cholesterol normal.  Flu vaccine 2002. Pneumovax 1999.  Tobacco:  The patient quit smoking in 1984, one pack per day for 30 years.  Alcohol none.  Living Will/health care power of attorney:  The patient reports she has completed and that her daughter is her health care power of attorney.  REVIEW OF SYSTEMS:  The patient reports an approximate 25-pound intentional weight loss since March 2002.  She denies any anorexia.  She has had no pain. She denies any fever or night sweats.  Her energy level has been good.  She denies any  unusual headaches or visual changes.  She has no hearing deficits. She denies any neck or back pain.  She did have some dyspnea on exertion prior to her initial admission.  She denies any cough.  She reports that she experienced chest pain with the MI in July 2002.  She denies any peripheral edema.  She does report a recent change in her bowel habits, with some intermittent constipation for the past month.  She denies any rectal bleeding. She has had no nausea, vomiting, or dysphagia.  She denies any abdominal pain. She denies any hematuria or dysuria.  She denies any known bleeding.  PHYSICAL EXAMINATION:  VITAL SIGNS:  Temperature 97.5, heart rate 88, respirations 20, blood pressure 140/70, oxygen saturation 98% on room air.  GENERAL:  Well-nourished white female in no acute distress.  HEENT:  Normocephalic, atraumatic.  Pupils are equal, round, and reactive to light.  Extraocular movements are intact.  Sclerae is anicteric.  Oropharynx is clear.  LYMPH:  No palpable lymph nodes.  LUNGS:  Clear bilaterally.  CARDIOVASCULAR:  Regular rate and rhythm.  ABDOMEN:  Soft and nontender.  Bowel sounds are active.  No hepatosplenomegaly.  EXTREMITIES:  No edema.  NEUROLOGIC:  Alert and oriented x 3.  Follows commands.  LABORATORY DATA:  Hemoglobin 8.5, white count 7.2, platelets 262,000, MCV 88.6.  Iron 64, TIBC 341, percent saturation 19, ferritin 64, B12 428, folate 13.6, LDH 110, haptoglobin 132.  Sodium 140, potassium 3.9, BUN 18, creatinine 1.1, glucose 93, calcium 9.0, total bilirubin 1.1, alkaline phosphatase 62, SGOT 21, SGPT 13, total protein 7.3, albumin 4.0.  Chest x-ray January 06, 2001, no active lung disease.  IMPRESSION AND PLAN:  [name] is a 76 year old woman with a recent MI, status post PTCA, who was noted to have a microcytic anemia in the setting of a normal B12, normal folate, low normal ferritin, normal LDH, normal  haptoglobin, and normal total bilirubin.   Her anemia is likely secondary to an occult bleed with possible etiologies including gastritis (patient on NSAIDs) with no bleeding seen on EGD, history of polyps, questionable diverticula, questionable AVMs, questionable cancer of the colon.  We suggest completing a GI workup with a repeat colonoscopy (the last one she had has been approximately 2-1/2 years ago.  We would also consider transfusing the patient to keep her hemoglobin greater than 10 prior to discharge.  We also recommend starting iron replacement.  She also likely has "anemia of chronic disease" and, therefore, we are unlikely to get her hemoglobin much greater than 10 unless we use Procrit, which Medicare will not cover in her case.  Dr. Darnelle Catalan will be glad to see her in follow-up in two to four weeks.  We will request prior laboratory work from her primary care Findley Vi to establish a baseline hemoglobin in the meantime.  The patient was seen and examined by Dr. Darnelle Catalan.  DD:  01/09/01 TD:  01/09/01 Job: [id number]

## 2010-11-17 NOTE — Discharge Summary (Signed)
NAME:  [name], [name]                          ACCOUNT NO.:  [id number]   MEDICAL RECORD NO.:  [id number]                   PATIENT TYPE:  INP   LOCATION:  0455                                 FACILITY:  [facility]   PHYSICIAN:  Mary Rankin. Mcgrath, M.D.              DATE OF BIRTH:  [birth year]   DATE OF ADMISSION:  02/02/2002  DATE OF DISCHARGE:  02/06/2002                                 DISCHARGE SUMMARY   ADMITTING DIAGNOSES:  1. Osteoarthritis left knee.  2. Asthma.  3. Hypertension.  4. Coronary arterial disease.  5. Myocardial infarction July 2002.  6. Status post cardiac catheterization September 2002.  7. History of blood transfusion without sequelae.  8. History of diverticulosis.   DISCHARGE DIAGNOSES:  1. Osteoarthritis left knee with valgus deformity status post left total     knee replacement arthroplasty.  2. Postoperative blood loss anemia.  3. Status post transfusion without sequelae.  4. Asthma.  5. Hypertension.  6. Coronary arterial disease.  7. Myocardial infarction July 2002.  8. Status post cardiac catheterization September 2002.  9. History of diverticulosis.   PROCEDURE:  The patient was taken to the OR on February 02, 2002.  Underwent a  left total knee replacement arthroplasty.  Surgeon Dr. Homero Fellers Mcgrath.  Assistant [name], P.A.-C.  Surgery under spinal anesthesia.  Hemovac  drain x1.  Tourniquet time of 56 minutes at 300 mmHg.   BRIEF HISTORY:  The patient is a 77 year old female well known to Dr. Ollen Mcgrath who has been seen and evaluated for osteoarthritis of the left knee.  She was found in the office to have severe changes and it was felt she would  benefit from undergoing a total knee replacement arthroplasty due to the  fact that she has been refractory to nonoperative management.  Risks and  benefits discussed and she has elected to proceed with surgery.   LABORATORY DATA:  CBC on admission:  Hemoglobin 12.4, hematocrit 35.2, white  cell count 10.0, red cell count 3.91.  Serum H&Hs were followed.  Hemoglobin  down to 9.1 and continued to drop down to 8.0 with a hematocrit of 22.7.  Given blood.  Post transfusion hemoglobin 10.7 and 30.2.  PT/PTT on  admission 13.4 and 31, respectively with an INR of 1.0.  Serial pro times  followed per Coumadin protocol.  Last noted PT/INR 21.4 and 2.1,  respectively.  Chemistry panel on admission:  Slightly elevated BUN 30.  Remaining chemistry panel all within normal limits.  Serial BMETs were  followed.  BUN decreased within normal limits.  Was last noted at 30.  Potassium dropped from 4.2 down to 3.2, was back up to 3.3 prior to  discharge.  Remaining electrolytes within normal limits.  Urinalysis on  admission:  Only trace leukocyte esterase with only 0-2 white cells and rare  epithelial cells.  Otherwise negative.  Blood group type O-.  EKG dated January 29, 2002:  Normal sinus rhythm, normal EKG.  No significant  change from last tracing of March 05, 2001 confirmed by Dr. Cassell Mcgrath.   HOSPITAL COURSE:  The patient was admitted to [facility], taken to  OR, underwent the above stated procedure without complications.  The patient  tolerated procedure well.  Later transferred to recovery room and then to  the orthopedic floor for continued postoperative care.  The patient was  given 24 hours of postoperative IV antibiotics, placed on PCA analgesics for  pain control following surgery.  Hemovac drain placed at time of surgery was  pulled on postoperative day one.  Dressing changes were initiated on  postoperative day two.  She did have some drop in her blood and eventually  got down to 8.0 on her hemoglobin.  Was given 2 units of blood.  Tolerated  blood well.  Post transfusion hemoglobin back up to 10.7.  She also had a  drop in her potassium down to 3.2, was back up to 3.3.  Physical therapy and  occupational therapy was consulted postoperatively to assist with  gait  training ambulation and ADLs.  The patient tolerated therapy quite well.  Progressed and was ambulating approximately 24 feet by postoperative day  two, increased up to over 300 feet by postoperative day three.  By  postoperative day four she was doing extremely well, responded to blood,  been weaned over to p.o. analgesics, and was discharged home.   DISCHARGE PLAN:  The patient was discharged home on February 06, 2002.   DISCHARGE DIAGNOSES:  Please see above.   DISCHARGE MEDICATIONS:  Percocet for pain, Robaxon for spasm, Coumadin as  per pharmacy protocol.   ACTIVITY:  [name] home care, home health PT, home health nursing for total  knee protocol.  Weightbearing as tolerated.   FOLLOW UP:  Two weeks from surgery.   DISPOSITION:  Home.   CONDITION ON DISCHARGE:  Improved.     [name], P.A.              [name], M.D.    ALP/MEDQ  D:  02/18/2002  T:  02/18/2002  Job:  [id number]

## 2010-11-17 NOTE — Cardiovascular Report (Signed)
NAME:  [name], [name]                          ACCOUNT NO.:  [id number]   MEDICAL RECORD NO.:  [id number]                   PATIENT TYPE:  OIB   LOCATION:  2855                                 FACILITY:  [facility]   PHYSICIAN:  Madaline Savage, M.D.             DATE OF BIRTH:  02/03/1928   DATE OF PROCEDURE:  05/22/2002  DATE OF DISCHARGE:                              CARDIAC CATHETERIZATION   PROCEDURES PERFORMED:  1. Selective coronary angiography by Judkins technique.  2. Retrograde left heart catheterization.  3. Left ventricular angiography.  4. Percutaneous cutting balloon angioplasty of the mid-circumflex coronary     artery without complication.   COMPLICATIONS:  None.   ENTRY SITE:  Right femoral.   DYE USED:  Omnipaque.   PATIENT PROFILE:  The patient is a delightful 77 year old white married  female, a patient of Dr. Mosetta Putt, who has had shortness of breath  recently and has not seen her primary caregiver in a long time.  She had a  Cardiolite stress test performed on May 12, 2002 which showed mild  anterior ischemia and an ejection fraction of 65%.  Based on this  information and the fact that the patient had a myocardial infarction in  July of 2002 and then had a cutting balloon angioplasty of her obtuse  marginal branch of the circumflex, she has subsequently undergone stenting  of this vessel on March 04, 2002 by Dr. Darlin Priestly.  Today's  procedure was performed on an outpatient basis electively without  complication.   RESULTS:  Pressures:  The left ventricular pressure was 145/5, end-diastolic  pressure of 10, central aortic pressure 145/65, mean of 100.  No aortic  valve gradient by pullback technique.   Angiographic results:  The left main coronary artery was normal.  It was  short.   The LAD and its diagonal branch are both normal.  The diagonal arises just  before the first septal perforator branch.  The LAD courses to the  cardiac  apex.   The left circumflex is normal proximally and in the midportion.  As the  circumflex bifurcates into an obtuse marginal branch #1 and obtuse marginal  branch #2, there is noted to be a radiopaque stent in the circumflex obtuse  marginal branch #2 near the takeoff from the circumflex itself.   There is in-stent restenosis in the proximal obtuse marginal branch #1,  which is a 3-mm vessel.  It is a type A lesion, which is concentric and  located in the midportion of the stent.   The distal circumflex is normal.   The right coronary artery is large and dominant and normal.   The left ventricle shows normal contractility.  I estimate ejection fraction  at 60-65%.  No wall motion abnormalities are appreciated.   INTERVENTIONAL PROCEDURE:  This was performed with a 6-French left Judkins 4  guiding catheter, a TEFL teacher  wire and a 3.0 x 10.0-mm cutting  balloon.  The cutting balloon was centrally located over the stent, which  was a 3.0 x 12.0-mm NIR Elite.  Care was taken to position the cutting  balloon well within the body of the stent.  We then slowly increased  pressure on the balloon as it inflated up to a peak inflation pressure of 9  atmospheres; we did this twice.  TIMI-3 class flow was preserved in the  distal vessel.  The lesion of 80% was reduced to 0% and no complications  occurred.  We used Angiomax during the case and our ACT was in excess of 300  seconds.   FINAL DIAGNOSES:  1. 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  2. Successful cutting balloon angioplasty of the same with reduction of an     80% lesion to 0%.   PLAN:  The patient is allergic to Plavix; we will use Ticlid for 30 days,  along with aspirin, and we will see the patient in followup in the very near  future.  For subsequent episodes of restenosis, we will definitely consider  brachytherapy.                                                Madaline Savage, M.D.    WHG/MEDQ  D:  05/22/2002  T:  05/22/2002  Job:  [id number]   cc:   Mosetta Putt, M.D.  [address] [place]  [place] [address]  Fax: [phone]   [facility]

## 2010-11-17 NOTE — Op Note (Signed)
[facility]  Patient:    [name], [name] Visit Number: [id number] MRN: [id number]          Service Type: SUR Location: 4W 0471 01 Attending Physician:  Loanne Drilling Dictated by:   Ollen Gross, M.D. Proc. Date: 07/21/01 Admit Date:  07/21/2001                             Operative Report  PREOPERATIVE DIAGNOSIS:  Osteoarthritis, right hip.  POSTOPERATIVE DIAGNOSIS:  Osteoarthritis, right hip.  PROCEDURE:  Right total hip arthroplasty.  SURGEON:  Ollen Gross, M.D.  ASSISTANT:  Marcie Bal. Troncale, P.A.-C.  ANESTHESIA:  Spinal.  ESTIMATED BLOOD LOSS:  200 cc.  DRAIN:  Hemovac x 1.  COMPLICATIONS:  None.  CONDITION:  Stable to recovery room.  BRIEF CLINICAL NOTE:  [name] is a 77 year old female with severe osteoarthritis of the right hip with pain refractory to nonoperative management. She presents now for right total hip arthroplasty.  PROCEDURE IN DETAIL:  After successful administration of spinal anesthetic, the patient was placed in the left lateral decubitus position with the right side up and held with the hip positioner. Right lower extremity is isolated from her _______ with plastic drapes and prepped and draped in the usual sterile fashion. The standard posterolateral incision was made, the skin cut with #10 blade through subcutaneous tissue to the level of the fascia lata, which was incised in line with the skin incision. Sciatic nerve was palpated and protected and the short external rotators isolated off the femur. Capsulectomy was then performed and the hip dislocated. The center of the femoral head marked and trial prosthesis placed such that the center of the trial head corresponds to the center of her native femoral head. The osteotomy was then made with an oscillating saw. Femur was retracted anteriorly, acetabular exposure obtained. Acetabular reaming was then initiated with a 45 coursing increments of  two to a 49 and then a 50 mm Pinnacle Acetabular shell was placed in the anatomic position and transfixed with two dome screws with excellent purchase. Trial 28 mm neutral liner was placed.  Femur was addressed, first with the canal finder, then the canal was irrigated and axial reaming was performed up to 13.5 mm. Proximal reaming was performed up to an 18D and the sleeve was machined to a small. An 18D small trial sleeve was placed with an 18 x 13 stem, 36+8 neck, and 28+0 head. Anteversion matched her native anteversion. The hip was then reduced with an outstanding stability, full extension, full external rotation, 70 degrees flexion, 40 degrees adduction, 90 degrees internal rotation and 90 degrees flexion and 90 degrees internal rotation. Hip was dislocated and trial was removed. The wound was irrigated and then the apex hole eliminator placed into the acetabular shell. A permanent 28 mm neutral Marathon liner was then placed into the shell and then the 18D small sleeve placed in its proximal femur. An 18 x 13 stem with 36+8 neck matching her native anteversion and a 28+0 head were all placed. Hip was reduced with the same stability parameters. The wound was copiously irrigated with antibiotic solution and short external rotators reattached to the femur through drill holes. Fascia lata was closed over Hemovac drain. with interrupted #1 Vicryl, subcutaneous closed in two layers with #1 then     2-0 Vicryl and subcuticular in a running 4-0 Monocryl. The incision was clean and dry and Steri-Strips and  a bulky sterile dressing applied. Drain was hooked to suction, she was placed into the knee immobilizer, awake and transported to recovery room in stable condition. Dictated by:   Ollen Gross, M.D. Attending Physician:  Loanne Drilling DD:  07/21/01 TD:  07/22/01 Job: [id number]

## 2010-11-17 NOTE — H&P (Signed)
[name], [name]                ACCOUNT NO.:  [id number]   MEDICAL RECORD NO.:  [id number]          PATIENT TYPE:  INP   LOCATION:  1535                         FACILITY:  [facility]   PHYSICIAN:  Ollen Gross, M.D.    DATE OF BIRTH:  [birth year]   DATE OF ADMISSION:  09/03/2007  DATE OF DISCHARGE:                              HISTORY & PHYSICAL   CHIEF COMPLAINT:  Left hip pain.   HISTORY OF PRESENT ILLNESS:  The patient is a 77 year old female who has  had ongoing discomfort and pain with the left hip.  She has known  progressive arthritis.  She has had intra-articular injection in the  past with long temporary benefit.  Unfortunately, she continues to have  progressive pain.  She felt she has reached the point where she would  benefit undergoing surgical intervention.  Risks and benefits discussed.  The patient was subsequently admitted to the hospital.  She has been  seen by Dr. Duaine Dredge, and Dr. Lavonne Chick preoperatively and felt to be  stable for surgery.   ALLERGIES:  NO KNOWN DRUG ALLERGIES.   INTOLERANCES:  SULFA CAUSES GI PROBLEMS CODEINE (THE PATIENT IS ABLE TO  TAKE VICODIN AND PERCOCET)   CURRENT MEDICATIONS:  Cozaar, fexofenadine, lovastatin, metoprolol,  triamterene/hydrochlorothiazide, atenolol, albuterol inhaler, aspirin,  vitamin D, calcium, fish oil, flaxseed oil, ibuprofen, Darvocet and  Ultracet.   PAST MEDICAL HISTORY:  1. Vertigo.  2. Asthma.  3. Hypertension.  4. History of myocardial infarction July 2002.  5. Coronary arterial disease.  6. History of anemia.   PAST SURGICAL HISTORY:  1. Cardiac catheterization with stent placement.  2. Cholecystectomy.  3. Bladder tack procedure.  4. Left shoulder arthroscopy.  5. Left knee arthroscopy.  6. Left total knee arthroplasty.   SOCIAL HISTORY:  Married.  Past smoker, one to two drinks of alcohol per  month.  Two children.  Family will be assisting with care after surgery.   FAMILY HISTORY:  Mother  deceased age 22 with heart disease.  Father  deceased age 49 with cancer.   REVIEW OF SYSTEMS:  GENERAL:  No fevers, chills, night sweats.  NEUROLOGICAL:  No seizures, syncope or paralysis.  RESPIRATORY:  No  shortness breath, productive cough or hemoptysis.  CARDIOVASCULAR:  No  chest pain, orthopnea.  GI: No nausea or constipation.  GU: No dysuria,  hematuria or discharge.  MUSCULOSKELETAL:  Left hip.   PHYSICAL EXAMINATION:  VITAL SIGNS:  Pulse 76, respirations 14, blood  pressure 122/60.  GENERAL: A 77 year old white female well-nourished, well-developed,  short-statured, overweight, no acute distress.  She is alert, oriented  and cooperative.  HEENT:  Normocephalic, atraumatic.  Pupils round and reactive.  Oropharynx clear.  EOMs intact.  NECK:  Supple.  CHEST: Clear.  HEART:  Regular rate and rhythm.  No murmur, S1-S2 noted.  ABDOMEN:  Soft, nontender.  Bowel sounds present.  BREASTS/GENITALIA/RECTAL:  Not done, not pertinent to present illness.  EXTREMITIES:  Left hip flexion 90-0 internal rotation, 10-50 degrees  external rotation, 10-15 abduction.   IMPRESSION:  Osteoarthritis of the left hip.  PLAN:  Patient admitted to [facility] to undergo a left total  knee replacement arthroplasty.  Surgery will be performed by Dr. Ollen Gross.      Alexzandrew L. Perkins, P.A.C.      Ollen Gross, M.D.  Electronically Signed    ALP/MEDQ  D:  09/04/2007  T:  09/05/2007  Job:  [id number]   cc:   Mosetta Putt, M.D.  Fax: [phone]   Madaline Savage, M.D.  Fax: [phone]   Ollen Gross, M.D.  Fax: [phone]

## 2010-11-17 NOTE — Cardiovascular Report (Signed)
[facility]  Patient:    [name], [name]                       MRN: [id number] Proc. Date: 01/01/01 Adm. Date:  [id number] Attending:  Molpus, Carlisle Beers CC:         Carolyne Fiscal, M.D.  Cardiac Catheterization Laboratory   Cardiac Catheterization  PROCEDURES PERFORMED: 1. Cutting balloon angioplasty of the proximal portion of obtuse marginal    branch #2 of the left circumflex coronary artery. 2. Selective coronary angiography by Judkins technique. 3. Retrograde left heart catheterization . 4. Left ventricular angiography.  COMPLICATIONS:  None.  ENTRY SITE:  Right femoral.  DYE USED:  Omnipaque.  PATIENT PROFILE:  The patient is a 77 year old woman who presented to the emergency room at [facility] with chest pain that started last morning when she woke up and was making a bed.  It was anginal in nature and resolved.  It then occurred again this morning and was the same type of pain with radiation into the left arm.  In the emergency room, she was found to have negative cardiac enzymes and no evidence of ST segment changes on her EKG.  She was admitted and underwent catheterization electively.  RESULTS:  PRESSURES:  The left ventricular pressure was 145/16.  Central aortic pressure was 145/75, with a mean of 100.  No aortic valve gradient by pullback technique.  ANGIOGRAPHIC RESULTS: The left main coronary artery was normal.  The left anterior descending coronary artery coursed to the cardiac apex and gave rise to one diagonal branch arising before the septal perforator branch. No lesions were seen.  The left circumflex coronary artery consisted of a medium sized circumflex coronary artery and a medium sized obtuse marginal branch #1 and a large bifurcating obtuse marginal branch #2.  At the proximal portion of OM-1 was a stenosis that was B-1 in shape and 90 degrees stenosed.  It was best seen on a 28 degree RAO  projection with 1 degree of caudal angulation.  The right coronary artery was a dominant vessel and no lesions were seen.  The left ventricle showed normal contractility.  Ejection fraction estimation was 60%.  INTERVENTIONAL PROCEDURE:  This was performed through a 7-French sheath.  The guide catheter used was a 7-French Judkins left 3.0.  The guide wire was a 185 cm Patriot wire.  The angioplasty device was a 3.0 x 10 mm cutting balloon.  The peak inflation pressure was 7 atm for 60 seconds.  Two inflations were performed.  The lesion before balloon inflation was 90 degrees stenosis and after balloon angioplasty was 0% to 10% residual.  During balloon inflations, the patient had both ST segment elevation and chest pain reminiscent of the pain she had earlier today and earlier yesterday morning.  ST segments and chest pain resolved after deflation of the balloon within about 30 seconds.  FINAL DIAGNOSES: 1. Single-vessel coronary artery disease of obtuse marginal branch #2. 2. Successful cutting balloon angioplasty of obtuse marginal #2 with a 90%    lesion reduced to 0% residual. DD:  01/01/01 TD:  01/01/01 Job: [id number]

## 2010-11-17 NOTE — Discharge Summary (Signed)
NAMEJILLIANE, [name]                ACCOUNT NO.:  [id number]   MEDICAL RECORD NO.:  [id number]          PATIENT TYPE:  INP   LOCATION:  1535                         [facility]ACILITY[facility]   PHYSICIAN:  Ollen Gross, M.D.    DATE OF BIRTH:  [birth year]   DATE OF ADMISSION:  09/03/2007  DATE OF DISCHARGE:  09/07/2007                               DISCHARGE SUMMARY   ADMITTING DIAGNOSES:  1. Osteoarthritis, left hip.  2. Vertigo.  3. Asthma.  4. Hypertension.  5. History of myocardial infarction July 2002.  6. Coronary arterial disease.  7. History of anemia.   DISCHARGE DIAGNOSES:  1. Osteoarthritis left hip, status post left total hip replacement      arthroplasty.  2. Postoperative blood loss anemia.  3. Status post transfusion without sequelae.  4. Mild postoperative hypokalemia.  5. Vertigo.  6. Asthma.  7. Hypertension.  8. History of myocardial infarction July 2002.  9. Coronary arterial disease.  10.History of anemia.   PROCEDURE:  September 03, 2007 left total hip surgery.   SURGEON:  Ollen Gross, M.D.   ASSISTANT:  Avel Peace PA-C.   ANESTHESIA:  General.   CONSULTS:  None.   BRIEF HISTORY:  [name] is a 77 year old female with end-stage  arthritis of right hip, progressive worsening pain and dysfunction,  failed non-operative management, now presents for total hip  arthroplasty.   LABORATORY DATA:  Pre-op CBC showed a hemoglobin of 12.5, hematocrit  36.2, white cell count 11.1, platelets 340.  Postoperative hemoglobin  12.5 down to 10.  Given continued drift down to 8, she was given 2 units  of blood.  Post-procedure, she went back up to 10.83.  PT/PTT pre-op  13.5 and 29 respectively.  INR 1.0.  Serial pro-times followed, showed  PT/INR 27 and __________ .4.  Chem panel on admission:  Slightly  elevated BUN and creatinine 24 and 1.6 with total bili high at 1.4.  Serial B mets were followed.  Potassium did drop from 4.3 to 3.5, last  noted at 3.2.  BUN and  creatinine came down normal levels of 11 and 0.9.  Remaining electrolytes remained within normal limits.  Pre-op UA cloudy,  small leukocyte esterase, 36 white cells, granular casts noted.  Follow-  up UA negative.  Blood group type O negative.  Left hip film August 27, 2007:  Advanced left hip osteoarthritis.  Two-view chest August 27, 2007:  No acute findings.  Portable pelvis hip films:  Left hip  replacement without complicating features.  EKG July 25, 2007:  Normal EKG confirmed by Dr. Duaine Dredge.   HOSPITAL COURSE:  The patient admitted to [facility],  tolerated procedure well, later transferred to recovery room, orthopedic  floor, started on PCA and p.o. analgesic pain control following surgery.  Had a tough night after the evening of surgery.  Did a little bit better  on morning of day one.  Did have a little of what she called spells with  breathing.  Had complaints of dry mouth.  We used an albuterol inhaler,  started nebulizer treatments.  Blood pressure was of little on the lower  side, started back on her blood pressure medications with parameters,  added nitro sublingual just in case.  She had a previous MI with  previous stenting, throat lozenges for the dry throat. Hemoglobin was  stable.  Output was descent, moderate in her output.  By day two, she  was doing much better, back to her normal smiling self.  Pain was under  good control.  Blood count unfortunately was down lower though to 8.0,  it was felt she would need blood.  Discontinue the PCA.  Output was  actually excellent.  Gave her two units of blood.  Dressing change,  incision looked good.  Tolerated the blood well, got up with therapy  walking about 50 feet.  Continued to progress well.  By the following  day, her hemoglobin was back up to above 10.  She was tolerating her  therapy, continued to progress, and by September 07, 2007 hemoglobin was  10.8.  Wound looked good, progressing well.  Was  discharged home.  Did  get one dose of K-Dur before discharge, since she had a lower potassium.   DISCHARGE/PLAN:  1. Patient discharged home on September 07, 2007.  2. Discharge diagnoses:  Please see above.  3. Discharge meds:  Percocet, Robaxin, Nu-Iron, Coumadin.   ACTIVITY:  Partial weightbearing 25-50%  left lower extremity, Home  Health  nursing total protocol.   FOLLOWUP:  Two weeks, actually follow up on March 28 on a Friday, call  the office for appointment.   DISPOSITION:  Home.   CONDITION ON DISCHARGE:  Improved.      Alexzandrew L. Perkins, P.A.C.      Ollen Gross, M.D.  Electronically Signed    ALP/MEDQ  D:  10/06/2007  T:  10/06/2007  Job:  [id number]   cc:   Ollen Gross, M.D.  Fax: [phone]   Mosetta Putt, M.D.  Fax: [phone]   Madaline Savage, M.D.  Fax: [phone]

## 2010-11-17 NOTE — Discharge Summary (Signed)
[place]  Patient:    [name], [name]                       MRN: [id number] Adm. Date:  [id number] Disc. Date: [id number] Attending:  Ophelia Shoulder Dictator:   Polo Riley. Benjamine Mola, M.D. CC:         Ulyess Mort, M.D. [facility]  Valentino Hue. Magrinat, M.D.  Carolyne Fiscal, M.D.   Discharge Summary  HISTORY OF PRESENT ILLNESS:  The patient is a 77 year old, married, white female patient of Dr. Elsie Lincoln with single-vessel disease.  She is status post SEMI with ___ with a cutting balloon angioplasty done on January 01, 2001.  She came back into the hospital after discharge on January 03, 2001 secondary to pressure behind her ears and discomfort in her shoulders.  She was awakened at 4 a.m. with soreness in her chest and mild shortness of breath.  She took two sublingual nitroglycerin with no change in her symptoms.  She went to the ER and received nitroglycerin sublingual and IV nitroglycerin. She was seen in the emergency room by Dr. Elsie Lincoln.  She did not have any EKG changes.  HOSPITAL COURSE:  It was decided that she should undergo cardiac catheterization.  She did have some abnormal troponin levels.  She was also consulted by GI on January 06, 2001.  It was decided if her catheterization was negative that she would need an EGD study.  Her catheterization showed a 25% at the previous PTCA site.  She went on to have an EGD which showed some gastritis and erosion sin her antrum.  A test was performed on January 07, 2001.  She also had a hematology consult on January 08, 2001 because of unknown cause of anemia.  She was seen by Dr. Darnelle Catalan who thought that her hemoglobin being low was possibly due to occult blood.  He thought she should redo a colonoscopy.  He ordered her transfused to keep her hemoglobin greater than ten and suggested starting iron replacement.  He also thought that her anemia may be due to chronic disease and felt that a hemoglobin of much  greater than ten would probably be unlikely to accomplish.  He recommended follow up in his office in two to four weeks.  Dr. Marina Goodell was consulted.  He did not apparently feel that EGD was warranted at this time and felt that she should follow up with doctors in [place].  She was seen by Dr. Tresa Endo on January 09, 2001 who thought we could stop her Plavix by January 15, 2001 and decrease her aspirin to 81 mg, continue the Protonix and her other medications.  Her hemoglobin on January 09, 2001 was up to 11.9 after two units of packed red blood cells.  Of note, prior to her blood transfusion, she did also receive a Tylenol No. 2 with Benadryl 25 mg p.o. She had Lasix in between her units of blood.  DIAGNOSTIC STUDIES:  Haptoglobin was 132.  Hemoglobin on January 08, 2001 was 8.5 and hematocrit 24.  Sed rate was 22.  Erythropoietin was pending.  Platelets were 262.  Differential:  CBC was within normal limits.  Sodium 140, potassium 3.9, BUN 18, creatinine 1.1, and glucose 93.  Iron was 64.  TIBC was 341. Percent saturation was 19.  B12 was 428.  Folate was 13.6, and ferritin was 64.  CK-MB #1 49/1.2 and troponin 0.77, #2 44/1.1 and troponin 0.67, #3 44/1.2  and troponin 0.56.  Haptoglobin was 132.  Her blood type was O negative. Helicobacter was pending.  DISCHARGE MEDICATIONS: 1. Cozaar 25 mg once per day. 2. Toprol XL 25 mg once per day. 3. Enteric-coated aspirin 81 mg once per day. 4. Protonix 40 mg once per day. 5. Plavix 75 mg per day until January 15, 2001. 6. Ferrous sulfate 325 mg b.i.d. 7. Maxzide take as needed for swelling. 8. Nitroglycerin 1/150 as needed for chest pain.  DISCHARGE ACTIVITY:  She may resume her regular activity tomorrow.  DISCHARGE DIET:  Low fat, low salt diet.  If she has any problems with her groin, she will call.  She should have a CBC drawn on January 17, 2001 and ask ____ to draw her lab tests with lipoprotein and homocystine.  She apparently has had normal  cholesterol levels in the past, and we should check the further lipid analogies.  She will follow up with Dr. Darnelle Catalan in two to four weeks and follow up with Dr. Victorino Dike in two weeks.  DISCHARGE DIAGNOSES: 1. Chest pain, not cardiac etiology. 2. Anemia, unknown source at this time. 3. Hypertension, controlled. 4. Gastritis and erosions in the antral area without any hemorrhage on    esophagogastroduodenoscopy. 5. Atherosclerotic cardiovascular disease status post subendocardial    myocardial infarction and cutting balloon angioplasty of her OM2 on January 01, 2001, recatheterization on January 06, 2001 with only 25% restenosis at that    site. 6. History of colon polyps. 7. Recent heme-positive while on aspirin, Plavix, and Integrilin.  Negative    guaiac stools in hospital this admission. DD:  01/09/01 TD:  01/09/01 Job: [id number]

## 2010-11-17 NOTE — Cardiovascular Report (Signed)
[name]. [facility]  Patient:    [name], [name] Visit Number: [id number] MRN: [id number]          Service Type: MED Location: [phone] Attending Physician:  [name] Dictated by:   Lenise Herald, M.D. Proc. Date: 03/04/01 Admit Date:  03/02/2001   CC:         Cardiac Catheterization Laboratory  [name], M.D.   Cardiac Catheterization  PROCEDURE: 1. Left heart catheterization. 2. Coronary angiography. 3. Left ventriculogram. 4. OM-II proximal. 5. Cutting balloon angioplasty. 6. Placement of intercoronary stent.  CARDIOLOGIST:  Lenise Herald, M.D.  COMPLICATIONS:  None.  INDICATIONS:  Ms. [name] is a 77 year old female, a patient of Dr. Deanna Mcgrath. [place] with a history of coronary artery disease, status post cutting balloon angioplasty on January 01, 2001, with an excellent result.  The patient on repeat catheterization on January 06, 2001, secondary to recurrent chest pain, with no evidence of significant restenosis.  The patient was readmitted on March 02, 2001, with crescendo angina.  She subsequently ruled in for a myocardial infarction with positive troponins.  She is now brought back for a cardiac catheterization.  DESCRIPTION OF PROCEDURE:  After giving a written informed consent, the patient was brought to the cardiac catheterization laboratory where the right and left groins were shaved, prepped, and draped in the usual sterile fashion. Electrocardiogram monitoring was established.  Using the modified Seldinger technique, a 6-French arterial sheath was inserted in the right femoral artery.  The 6-French diagnostic catheters were then used to perform a diagnostic angiography.  RESULTS 1. Left main coronary artery:  This revealed a medium-sized left main coronary    artery with no significant disease. 2. Left anterior descending coronary artery:  The left anterior descending    coronary artery is a  medium-sized vessel which coursed to the apex and    gave [place] one large diagonal branch.  The LAD had no significant disease.    The first diagonal was a medium-sized vessel with no significant disease. 3. Left circumflex coronary artery:  The left circumflex coronary artery is    a large vessel which coursed in the AV groove and gave off three obtuse    marginal branches.  The AV groove circumflex had no significant disease.    The first OM is a small vessel with a 30% ostial lesion.  The second OM is    a large vessel with a 95% proximal stenosis.  This vessel bifurcates    distally.  The third OM is a small vessel, with no significant disease. 4. Right coronary artery:  The right coronary artery is a medium-sized    vessel which is dominant and gives rise to both the PDA as well as the    posterolateral branch.  There is no significant disease in the RCA, PDA,    or posterolateral branch.  LEFT VENTRICULOGRAM:  Reveals normal ejection fraction at 55%.  There appears to be mild anterolateral hypokinesis.  HEMODYNAMICS Systemic arterial pressure:  140/68. LV systemic pressure:  130/14. LVEDP:  18.  INTERVENTIONAL PROCEDURE:  OM-II-proximal:  Following diagnostic angiography, a 6-French sheath was then exchanged for a 7-French sheath, and 7-French JL3 guiding catheter was then coaxially engaged in the left coronary ostium. Selective angiogram was then performed.  Next, a short 0.014 Patriot guide wire was then advanced out of the guiding catheter into the proximal circumflex.  The guide wire was then positioned in the distal second OM  without difficulty.  Next, a 3.0 mm x 10.0 mm cutting balloon was advanced across the stenotic lesion.  Two subsequent inflations to a maximum of 6 atmospheres were then performed for a total of two minutes.  Follow-up angiogram revealed excellent luminal gain with no evidence of dissection or thrombus.  The cutting balloon was then removed and a Nir  Elite 3.0 mm x 12.0 mm stent was then charged across the stenotic lesion.  The stent was then deployed to a maximum of 12 atmospheres for a total of approximately 63 seconds.  Follow-up angiogram revealed no evidence of dissection or thrombus, with TIMI-3 flow to the distal vessel.  IV heparin was given to maintain the ACT around 300.  Final orthogonal angiograms revealed less than 10% residual stenosis in the proximal OM, with TIMI-3 flow in the distal vessel, and no evidence of dissection or thrombus.  At this point we elected to conclude the procedure. All balloons, wires, and catheters were removed.  Hemostatic sheaths were sewn in place.  The patient was transferred back to the recovery room in a stable condition.  CONCLUSIONS: 1. Successful cutting balloon angioplasty with adjunct placement of a    Nir Elite 3.0 mm x 12.0 mm coronary stent in the proximal second    obtuse marginal stenotic lesion. 2. Normal left ventricular systolic function with wall motion abnormalities    noted above. Dictated by:   Lenise Herald, M.D. Attending Physician:  [name] DD:  03/04/01 TD:  03/04/01 Job: [id number]

## 2010-11-17 NOTE — Discharge Summary (Signed)
[facility]  Patient:    Mary, Mcgrath Visit Number: [id number] MRN: [id number]          Service Type: SUR Location: [id number] Attending Physician:  Loanne Drilling Dictated by:   Sammuel Cooper Mahar, P.A. Admit Date:  07/21/2001 Discharge Date: 07/25/2001                             Discharge Summary  DATE OF BIRTH:  [birth year]  ADMITTING DIAGNOSES: 1. Severe osteoarthritis of the right hip. 2. History of myocardial infarction. 3. History of anemia of chronic disease. 4. Hypertension. 5. Asthma.  DISCHARGE DIAGNOSES: 1. Status post right total hip arthroplasty. 2. Postoperative hemorrhagic anemia that did require a blood transfusion. 3. Severe osteoarthritis of the right hip. 4. History of myocardial infarction. 5. History of anemia of chronic disease. 6. Hypertension. 7. Asthma. 8. Low grade temperature, which resolved prior to discharge.  PROCEDURE:  Right total hip arthroplasty by Dr. Ollen Gross with the assistance of Irena Cords, P.A.-C.  Anesthesia was spinal.  CONSULTATION:  Cardiology.  HISTORY OF PRESENT ILLNESS:  The patient is a 77 year old female with history of severe pain in the right hip and groin for several months that had radiated down her thigh in the medial area.  She has progressive difficulty with functional activities over the past several months.  She cannot tie her shoes. She had difficulty getting in and out of cars and up and down stairs.  She was at the point where she wanted to get something done with the right hip secondary to it severely effecting her activities of daily living and quality of life.  Risks and benefits of the proposed surgery were discussed with the patient prior to admission by Dr. Ollen Gross.  She accepted them and decided to proceed.  LABORATORY DATA AND X-RAY FINDINGS:  On July 14, 2001, CBC was within normal limits with the exception of a hematocrit of 34.2.   Hemoglobin and hematocrit were monitored postoperatively decreasing down to a low of 8.8 and 24.8 respectively, at which time she was transfused two units of packed red blood cells.  Her H&H responded very well increasing up to 10.8 and 31.3 on July 24, 2001.  On January 13, the PT/INR and PTT were within normal limits.  These were monitored postoperatively while on Coumadin protocol gradually increasing up and reaching therapeutic range on July 23, 2001, of 20.3 and 2.1 respectively.  They were therapeutic on the day of discharge.  Complete metabolic panel on July 14, 2001, revealed BUN elevated at 27, otherwise normal.  On July 22, 2001, basic metabolic panel was rechecked. Sodium was 133, glucose 159, otherwise within normal limits.  UA on July 14, 2001, was negative with the exception of small leukocyte esterase, a few epithelials and hyaline casts.  Blood typing from January 20,2003, type O, Rh type negative, antibody screen negative.  X-rays from July 21, 2001, of the right hip and pelvis revealed satisfactory position and placement of femoral component as well as acetabular component.  Satisfactory followup exam of the total hip replacement on the right.  HOSPITAL COURSE:  On July 21, 2001, the patient was taken to the operating room for the above listed procedure.  She tolerated the procedure very well without any complications.  There was one Hemovac drain placed intraoperatively.  She was transferred to the recovery room in stable condition.  Postoperatively, she was  started on appropriate antibiotic course and completed this without any difficulty.  Her diet was advanced as she tolerated without any problems or complications.  Pain control was gained utilizing a PCA and morphine.  She was transitioned over to p.o. analgesics throughout postoperative day #2.  Pain control remained adequate.  Hemovac drain that was placed intraoperatively was  discontinued on postop day #1, intact without any difficulty.  Incentive spirometer was utilized postoperatively q.2h.  Cardiology consult was ordered postoperatively as the patient has had a recent balloon PTCA.  She was stable for a cardiac standpoint.  She did not develop any complications in that regard.  Please see cardiology note for specifics on that.  Neurovascular checks were instituted throughout her hospital stay.  She remained neurovascularly intact without any difficulty.  The patient was placed on Coumadin protocol and this was monitored per pharmacy throughout the hospital stay and it did gradually increase up to the therapeutic range prior to discharge.  On postop day #2, the patients hemoglobin and hematocrit had decreased down to 8.8 and 24.8 respectively.  Per cardiologys note, they had recommended keeping her hemoglobin above 9.  On January 22, the patient was transfused two units of packed red blood cells.  The risks and benefits of this were discussed with the patient prior to transfusion and she did agree.  On postop day #3, following transfusion, the patients hemoglobin and hematocrit did respond very well.  Physical therapy and occupational therapy were consulted to work with the patient on total protocol and precautions.  She progressed well with them with her ambulation as well as understanding of hip precautions.  She is touchdown weightbearing to the operating extremity.  She got to moderate independence ambulating in excess of 60 feet prior to discharge. Rehabilitation consult was also ordered on this patient.  They did see her on July 22, 2001, and thought that she would likely progress to the point that she would be safe to be discharged to her home with home health physical therapy and nursing.  On postop day #2, the patients operative dressing was taken down to reveal a good looking incision without any signs or symptoms of infection with no significant  drainage, purulence or erythema was noted. Dressing changes were done daily thereafter in the hospital and incision  continued to look good without any problems.  Discharge planners were consulted to assist with home needs and home health setup as the patient was progressing along very well with therapy.  The patient did develop a low-grade temperature over postop day #2, likely secondary to atelectasis which did resolve with increased use of her incentive spirometer.  This had resolved prior to discharge.  By July 25, 2001, the patient was doing very well.  She had met all orthopedic goals, she was medically stable and ready for discharge to her home.  DISCHARGE MEDICATIONS: 1. Vicodin. 2. Robaxin. 3. Coumadin.  ACTIVITY:  Touchdown weightbearing to the right lower extremity.  Total hip precautions and protocol as educated by physical therapy and occupational therapy.  SPECIAL INSTRUCTIONS:  She may shower.  WOUND CARE:  Daily dressing changes should be done.  Supplies and instructions were given for this.  FOLLOWUP:  The patient is to follow up on February 4, with Dr. Lequita Halt.  She was instructed to call for an appointment time for this.  DIET:  Preoperative diet may continue as tolerated.  CONDITION ON DISCHARGE:  Stable and improved.  DISPOSITION:  The patient is being discharged home  with home health physical therapy as well as nursing for Coumadin management. Dictated by:   Sammuel Cooper. Mahar, P.A. Attending Physician:  Loanne Drilling DD:  07/31/01 TD:  07/31/01 Job: [id number]

## 2010-11-17 NOTE — Op Note (Signed)
TNAMEAIDE, WOJNAR                         ACCOUNT NO.:  [id number]   MEDICAL RECORD NO.:  [id number]                   PATIENT TYPE:  INP   LOCATION:  X001                                 FACILITY:  [facility]   PHYSICIAN:  Gus Rankin. Aluisio, M.D.              DATE OF BIRTH:  11-04-1927   DATE OF PROCEDURE:  02/02/2002  DATE OF DISCHARGE:                                 OPERATIVE REPORT   PREOPERATIVE DIAGNOSIS:  Osteoarthritis of left knee with valgus deformity.   POSTOPERATIVE DIAGNOSIS:  Osteoarthritis of left knee with valgus deformity.   PROCEDURE:  Left total knee arthroplasty.   SURGEON:  Gus Rankin. Aluisio, M.D.   ASSISTANT:  Alexzandrew L. Julien Girt, P.A.   ANESTHESIA:  Spinal.   ESTIMATED BLOOD LOSS:  Minimal.   DRAIN:  Hemovac x 1.   TOURNIQUET TIME:  56 minutes 300 mmHg.   COMPLICATIONS:  None.   CONDITION:  Stable to recovery.   BRIEF CLINICAL NOTE:  The patient is a 77 year old female, who has severe  osteoarthritis of the left knee with significant valgus deformity greater  than 15 degrees.  She has had pain refractory to nonoperative management  including injections and presents now for total knee arthroplasty.   PROCEDURE IN DETAIL:  After the successful administration of spinal  anesthetic, a tourniquet is placed high on the left thigh, left lower  extremity prepped and draped in the usual sterile fashion.  Extremity is  wrapped in Esmarch, knee flexed, tourniquet inflated to 300 mmHg.  Standard  midline incision is made with a 10 blade through subcutaneous tissue to the  level of the extensor mechanism.  A fresh blade is used to make a lateral  parapatellar arthrotomy given the valgus deformity.  Soft tissue on the  proximal and lateral tibia is elevated to the joint line with a knife.  Soft  tissue over the proximal medial tibia is left intact.  Patella is everted  medially, knee flexed to 90 degrees and intercondylar osteophytes removed.  ACL and PCL  are then removed.  Drill is used to create a starting hole in  the distal femur, and the canal is irrigated.  Five degree left valgus  alignment guide is placed.   Distal femoral block is placed and 9 mm taken off the distal femur.  Sizing  block is placed and size 2.5 is most appropriate.  Rotation is marked off  the epicondylar axis.  The 18 block is placed and anterior and posterior  cuts made.   Tibia is subluxed forward and the extramedullary tibial alignment guide  placed.  Referencing proximally off the medial aspect of the tibial tubercle  and distally on the second metatarsal axis and tibial crest, the block is  then pinned to remove 10 mm of tibia. Resection is made with an oscillating  saw.  Size 2.5 is the most appropriate tibia and is prepared with the  modular drill and keel punch.  The intercondylar block is then placed on the  distal femur, and the intercondylar and chamfer cuts made.  Size 2.5 trial  posterior stabilized femur, 2.5 mobile bearing tibia, and a 10 mm posterior  stabilized rotating platform inserter placed.  Full extension is achieved  with excellent varus and valgus balance throughout.  Patella is everted and  fixed and measured to be 20 mm, free hand resection taken to 12 mm, 38  patella placed, lug holes are drilled and the trial patella placed so it  tracks normally.   Osteophytes are then removed off the posterior femur with the trial in  place.  All trials are removed and the cut bone surfaces prepared with  pulsatile lavage.  Cement is mixed and once ready for implantation, the size  2.5 mobile bearing tibial tray, size 2.5 femur and 35 patella are cemented  into place.  Patella is held with a clamp.  Trial 10 mm inserter placed,  knee held in full extension, all extruded cement removed.  Once the cement  is fully hardened, the permanent 10 mm posterior stabilized rotating  platform, size 2.5 insert is placed.  Full extension again is achieved  with  excellent varus and valgus balance throughout.  The wound is copiously  irrigated with antibiotic solution and then the tourniquet released, total  time of 56 minutes.  Minor bleeding stopped with cautery.  The arthrotomy is  closed over a Hemovac drain with interrupted #1 PDS with medial and lateral  arthrotomy left open from the superior and inferior aspect of the patella,  serving as a mini lateral release.  The fat flap was closed prior to this.  Subcu is closed with interrupted 2-0 Vicryl.  The flexion against gravity is  135 degrees.  Subcuticular is closed with running 4-0 Monocryl.  Incision is  clean and dry.  Steri-Strips and a bulky sterile dressing applied.  The  patient is awakened and transported to recovery in stable condition.                                                Gus Rankin Aluisio, M.D.    FVA/MEDQ  D:  02/02/2002  T:  02/05/2002  Job:  [id number]

## 2010-11-17 NOTE — Discharge Summary (Signed)
[place]  Patient:    [name], [name] Visit Number: [id number] MRN: [id number]          Service Type: MED Location: [phone] Attending Physician:  Ophelia Shoulder Dictated by:   Marya Fossa, P.A. Admit Date:  03/02/2001 Discharge Date: 03/05/2001   CC:         Carolyne Fiscal, M.D.   Discharge Summary  DATE OF BIRTH:  [birth year]  ADMISSION DIAGNOSES: 1. Chest pain, rule out myocardial infarction. 2. Hypertension. 3. Asthma. 4. Unknown lipid status.  DISCHARGE DIAGNOSES: 1. Chest pain, resolved.  Status post subendocardial myocardial infarction. 2. Coronary artery disease, status post cutting balloon intervention to the    circumflex obtuse marginal #2.  Ejection fraction 60%. 3. Hypertension. 4. Asthma. 5. Unknown lipid status. 6. Hypokalemia, repleted. 7. Mild anemia, fecal occult blood positive, we will arrange outpatient    gastrointestinal consultation.  HISTORY OF PRESENT ILLNESS:  [name] is a 77 year old white female with complaints of chest pain.  Apparently, the pain started in the morning yesterday as she was making a bed.  It was across the chest.  There was no nausea, shortness of breath, or diaphoresis.  Duration unknown.  On the night of admission she woke up at 2 a.m. in the morning with the same type of pain, somewhat squeezing, with occasional left arm discomfort, but no other symptoms.  In the emergency room, EKG showed normal sinus rhythm with nonspecific ST-T wave changes.  Plans were made to admit the patient to rule out myocardial infarction.  Start IV heparin per pharmacy, IV nitroglycerin, Plavix, and aspirin, and low dose beta blocker.  PROCEDURES:  Cardiac catheterization on 01/01/01, by Dr. Chanda Busing.  COMPLICATIONS:  None.  CONSULTATIONS:  None.  HOSPITAL COURSE:  [name] was admitted through [facility] on 01/01/01, for unstable angina.  EKG was  non-acute.  She was started on IV heparin, nitroglycerin, aspirin, Plavix, and low dose beta blocker therapy.  Because of a history of asthma, we were cautious with beta blocker therapy.  Admitting labs showed white blood cell 9.0, hemoglobin 10.9, platelets 279. INR 1.1.  Potassium 4.2, BUN 24, creatinine 1.3.  Liver function tests within normal limits.  Hemoglobin A1C normal.  Total cholesterol 163, triglycerides 86, HDL 65, LDL 81.  Cardiac enzymes were positive as follows:  CK 92, 96, MB 2.1, 6.8, troponin-I 0.58, 1.11.  TSH was 3.701.  Total iron 49, TIBC 316, percent saturation 16 (low).  B12 429, folate 11.8, ferratin 60.  The patient was guaiac positive on 01/02/01.  On 01/01/01, the patient underwent cardiac catheterization by Dr. Chanda Busing.  This revealed a normal left main, normal left anterior descending artery.  Circumflex had a 90% circ OM2 lesion.  The right coronary artery was widely patent.  Ejection fraction 60% with no mitral regurgitation.  Dr. Elsie Lincoln proceeded with cutting balloon intervention to the circ OM lesion. During inflation the patient developed ST elevation and anginal chest pain.  The lesion was successfully reduced from 90 to 0%.  Integrilin was bolused and infused.  Overall, the patient tolerated the procedure well.  There were no problems with sheath pull.  On 01/02/01, the patient remained stable with only mild epigastric discomfort occurring the evening prior, but no current pain.  Hemoglobin 10.2.  Potassium low at 3.1.  BUN 19, creatinine 1.2.  Potassium was repleted, and repeat basic metabolic panel on 01/03/01, showed a  potassium of 4.1.  BUN was 22, and creatinine 1.3.  As mentioned above, the patient had a positive Hemoccult stool.  The patient was given the option for inpatient consultation versus outpatient consultation.  She requested outpatient followup with Dr. Victorino Dike.  The patient recovered well from her small SEMI, and  intervention to the circ OM2 lesion.  The patient was seen by cardiac rehab.  The patient was felt stable for discharge to home on 01/03/01.  DISCHARGE MEDICATIONS: 1. Maxzide as before. 2. Cozaar 25 mg. 3. Toprol XL 25 mg. 4. Plavix 75 mg q.d. x 1 month. 5. Enteric-coated aspirin 325 mg q.d. 6. Protonix 40 mg q.d. 7. Nitroglycerin p.r.n. chest pain.  ACTIVITY:  No strenuous activity, limit to 5 pounds, no driving for two days.  DIET:  Low fat, low cholesterol, low salt, low sugar diet.  The patient is asked to call the office with any problems or questions.  FOLLOWUP: 1. She needs to call to set up a two week follow-up appointment with Dr.    Elsie Lincoln. 2. She will also need to call Dr. Polly Cobia office for a two week follow-up    appointment.  He will need to re-evaluate her low hemoglobin, elevated    blood sugar of 122 and 165 during the hospital stay. 3. She will also need to schedule an outpatient follow-up with Dr. Corinda Gubler for    her heme positive stool. Dictated by:   Marya Fossa, P.A. Attending Physician:  Ophelia Shoulder DD:  03/11/01 TD:  03/11/01 Job: [id number]

## 2010-11-17 NOTE — Cardiovascular Report (Signed)
NAME:  [name], [name]                          ACCOUNT NO.:  [id number]   MEDICAL RECORD NO.:  [id number]                   PATIENT TYPE:  OIB   LOCATION:  2899                                 FACILITY:  [facility]   PHYSICIAN:  Madaline Savage, M.D.             DATE OF BIRTH:  12/06/1927   DATE OF PROCEDURE:  DATE OF DISCHARGE:  10/20/2002                              CARDIAC CATHETERIZATION   PROCEDURES PERFORMED:  1. Selective coronary angiography by Judkins technique.  2. Retrograde left heart catheterization.  3. Left ventricular angiography.   COMPLICATIONS:  None.   ENTRY SITE:  Right femoral.   DYE USED:  Omnipaque.   PATIENT PROFILE:  The patient is a 77 year old active woman who has no chest  pain at present.  As part of routine testing of her coronary artery disease,  she underwent a Persantine Cardiolite stress test on September 11, 2002.  Her  left ventricular ejection fraction was 73% calculated, and there was noted  to be mild lateral ischemia as well as apical ischemia.   This was felt to represent a low-risk study.   When I appraised the patient of the results she felt inclined, as I did, to  proceed with the cardiac catheterization.   It should be pointed out that the patient has had an original stenotic area  in her mid posterolateral branch of her circumflex and 3 episodes of  restenosis despite cutting balloon angioplasty and stenting.  Her last  procedure for in-stent restenosis was performed with a cutting balloon on  May 24, 2002, and that cutting balloon was placed for in-stent  restenosis in her mid circumflex (posterolateral stent).   RESULTS:   PRESSURES:  The left ventricular pressure was 145/14, end-diastolic pressure  20.  Central aortic pressure was 145/65, mean of 100.  No aortic valve  gradient by pullback technique.   ANGIOGRAPHIC RESULTS:  The left main coronary artery was normal.   The left anterior descending coronary artery  courses to the cardiac apex and  was normal.  One diagonal branch arose from the LAD.  It bifurcated.  It was  normal.  No lesions were seen in the LAD or its bifurcating diagonal branch.   The left circumflex consisted of a long segment of proximal and mid  circumflex, then a first obtuse marginal branch, then a posterolateral  branch, and off of the circumflex there was noted to be an atrial circumflex  branch.  No lesions were seen in either the atrial circumflex or the first  obtuse marginal branch of the posterolateral.  The circumflex itself just  beyond the 2 branches contained a radioopaque stent which showed mild  dimpling which amounted to no more than 10% restenosis.  TIMI-3 flow was  noted distally.   The right coronary artery was nondominant.  No lesions were seen.   The left ventricle showed vigorous contractility with no wall motion  abnormalities in both an RAO projection and an LAO projection.  No wall  motion abnormalities or mitral regurgitation were seen.   FINAL DIAGNOSES:  1. Angiographically patent coronary arteries including a patent stent in the     mid circumflex just before the posterolateral branch, the site of     previous episodes of in-stent restenosis.  2. Normal left ventricular systolic function.  3. In retrospect, the patient had a false-positive Cardiolite.   RECOMMENDATIONS:  The patient should remain on her current medications and  continue risk factor modification.  The news today was good and she is  reassured.                                               Madaline Savage, M.D.    WHG/MEDQ  D:  10/20/2002  T:  10/21/2002  Job:  [id number]   cc:   Mosetta Putt, M.D.  [address] [place]  [place] [address]  Fax: [phone]   [facility]

## 2010-11-17 NOTE — H&P (Signed)
[place]  Patient:    [name], [name]                       MRN: [id number] Adm. Date:  [id number] Attending:  Lorre Nick Dictator:   Marya Fossa, P.A. CC:         Carolyne Fiscal, M.D.   History and Physical  DATE OF BIRTH:  Jan 08, 1928  ADMISSION DIAGNOSES: 1. Chest pain, rule out myocardial infarction. 2. Coronary artery disease with recent PCI OM2 on January 01, 2001. 3. Hypertension. 4. Favorable lipid profile. 5. Anemia with heme positive stools. 6. History of colon polyps.  CHIEF COMPLAINT:  Chest pain.  HISTORY OF PRESENT ILLNESS:  This is a 77 year old married white female, patient of Dr. Elsie Lincoln with single vessel coronary artery disease.  She is status post FEMI and PCI with a cutting balloon to the OM2 branch of the circumflex on 01/01/01, without complication.  We had a successful result. Ejection fraction was 60%.  The patient also has hypertension and hyperglycemia.  The patient was discharged to home on 75/5/02 in stable condition.  All weekend she has had pressure behind her ears and discomfort in the shoulders.  This morning she woke up around 4 to go to the bathroom, and was aware of soreness in her chest with mild shortness of breath.  There was no nausea or diaphoresis.  Symptoms were similar to her chest pain last week, but less intense.  Rated a 4/10.  She took two sublingual nitroglycerin without change in her symptoms.  She came to the emergency room and was given another sublingual nitroglycerin and then started on a nitroglycerin drip without significant change.  Her symptoms are currently about a 2/10.  ALLERGIES:  CODEINE, causing gastrointestinal upset, and SULFA.  MEDICATIONS: 1. Maxzide dose unknown. 2. Cozaar 25 mg. 3. Toprol XL 25 mg. 4. Plavix 75 mg. 5. Aspirin 325 mg q.d. 6. Protonix 40 mg q.d. 7. Nitroglycerin p.r.n.  PAST MEDICAL HISTORY: 1. Coronary artery disease, FEMI on 01/01/01.  CK  peak 96.  MB peak of 6.8.    Troponin-I peak of 1.11.  On 01/01/01, she underwent cardiac catheterization    which revealed single vessel coronary artery disease with an OM2 90%    lesion.  Dr. Elsie Lincoln proceeded with cutting balloon intervention, reducing    lesion to 0% with an excellent result.  Ejection fraction 60%.  No MR. 2. Cholesterol profile on 01/02/01, showed a total of 163, triglycerides 86, HDL    65, and LDL 81. 3. Asthma. 4. Hypertension. 5. Left knee and shoulder arthroscopies. 6. Cholecystectomy. 7. History of colon polyps. 8. Anemia, felt to be secondary to gastrointestinal bleed with heme positive    stools last week.  SOCIAL HISTORY:  The patient is married, mother of two.  She is a retired Catering manager.  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  No tobacco.  FAMILY HISTORY:  Noncontributory to this admission.  REVIEW OF SYSTEMS:  No fever, chills, or palpitations.  Heme positive stool last week with a hemoglobin in the 10s.  She has a history of polyps.  This is scheduled to be followed up as an outpatient with Dr. Duaine Dredge.  Remotely, Dr. Victorino Dike did a colonoscopy.  PHYSICAL EXAMINATION:  VITAL SIGNS:  Temperature 98.1, blood pressure 121/66, pulse 59, respiratory rate 20, O2 98% on room air.  GENERAL:  The patient is alert and  oriented x 3 in no acute distress.  HEENT:  Normocephalic, atraumatic.  Pupils are equal, round and reactive to light.  Extraocular movements intact.  Nares patent.  Pharynx benign.  NECK:  Supple without bruits or masses.  LUNGS:  Clear to auscultation.  CARDIAC:  Regular rate and rhythm without murmurs, rubs, or gallops.  Positive chest wall tenderness reproduces some of the patients symptoms.  ABDOMEN:  Soft, nontender, nondistended, normoactive bowel sounds x 4 quadrants.  EXTREMITIES:  Resolving hemosis right groin.  No bruits.  Small knot, stable. Distal pulses intact.  No lower extremity  edema.  NEUROLOGIC:  Cranial nerves II-XII grossly intact.  Moves all extremities equally.  Mentation intact.  LABORATORY DATA:  EKG shows normal sinus rhythm without acute ST or T-wave abnormality.  Chest x-ray result is pending.  Labs reveal a white blood cell count of 8.3, hemoglobin 10.2, platelets 338. Potassium 3.7, BUN 27, creatinine 1.4, glucose 119.  CK 49, MB 1.2, troponin-T elevated at 0.77.  The patient will be admitted to rule out myocardial infarction.  IMPRESSION AND PLAN: 1. Chest pain, cardiac, + or - chest wall with recent SVMI and circumflex OM2    PCI on 01/01/01.  She does have a positive troponin this morning.  She is on    IV nitroglycerin.  We will start sectional IV heparin and plan cardiac    catheterization this afternoon. 2. Anemia.  Heme positive stools last week.  Iron studies relatively normal.    Has been seen for colonoscopy by Dr. Victorino Dike in the past. 3. History of colon polyps.  We will call GU consult while the patient is    here.  We need to be cautious with anticoagulation. 4. Favorable lipid profile.  We will start statin for stabilization. DD:  01/06/01 TD:  01/06/01 Job: [id number]

## 2011-01-30 ENCOUNTER — Other Ambulatory Visit: Payer: Self-pay | Admitting: Geriatric Medicine

## 2011-01-30 DIAGNOSIS — R42 Dizziness and giddiness: Secondary | ICD-10-CM

## 2011-02-05 ENCOUNTER — Ambulatory Visit
Admission: RE | Admit: 2011-02-05 | Discharge: 2011-02-05 | Disposition: A | Discharge status: Home / Self Care | Payer: Medicare Other | Source: Ambulatory Visit | Attending: Geriatric Medicine | Admitting: Geriatric Medicine

## 2011-02-05 DIAGNOSIS — R42 Dizziness and giddiness: Secondary | ICD-10-CM

## 2011-03-23 LAB — COMPREHENSIVE METABOLIC PANEL
ALT: 18
AST: 25
Alkaline Phosphatase: 66
CO2: 26
Chloride: 103
GFR calc Af Amer: 36 — ABNORMAL LOW
GFR calc non Af Amer: 29 — ABNORMAL LOW
Glucose, Bld: 137 — ABNORMAL HIGH
Potassium: 4.3
Sodium: 139
Total Bilirubin: 1.4 — ABNORMAL HIGH

## 2011-03-23 LAB — URINE MICROSCOPIC-ADD ON

## 2011-03-23 LAB — URINALYSIS, ROUTINE W REFLEX MICROSCOPIC
Bilirubin Urine: NEGATIVE
Glucose, UA: NEGATIVE
Hgb urine dipstick: NEGATIVE
Ketones, ur: NEGATIVE
pH: 5.5

## 2011-03-23 LAB — CBC
Hemoglobin: 12.5
RBC: 4.03
WBC: 11.1 — ABNORMAL HIGH

## 2011-03-23 LAB — PROTIME-INR: Prothrombin Time: 13.5

## 2011-03-26 LAB — TYPE AND SCREEN
ABO/RH(D): O NEG
Antibody Screen: POSITIVE
DAT, IgG: NEGATIVE

## 2011-03-26 LAB — BASIC METABOLIC PANEL
BUN: 11
BUN: 18
CO2: 27
CO2: 28
Chloride: 101
Chloride: 101
Chloride: 99
Creatinine, Ser: 0.97
GFR calc Af Amer: 58 — ABNORMAL LOW
GFR calc Af Amer: >60
Glucose, Bld: 167 — ABNORMAL HIGH
Potassium: 3.5
Potassium: 3.7
Sodium: 135

## 2011-03-26 LAB — CBC
HCT: 23 — ABNORMAL LOW
HCT: 28.6 — ABNORMAL LOW
Hemoglobin: 8 — ABNORMAL LOW
MCHC: 35
MCHC: 35
MCV: 86.7
MCV: 88.8
MCV: 89.7
RBC: 3.22 — ABNORMAL LOW
RBC: 3.56 — ABNORMAL LOW
RDW: 12.8
WBC: 11.2 — ABNORMAL HIGH

## 2011-03-26 LAB — URINALYSIS, ROUTINE W REFLEX MICROSCOPIC
Bilirubin Urine: NEGATIVE
Hgb urine dipstick: NEGATIVE
Ketones, ur: NEGATIVE
Nitrite: NEGATIVE
Protein, ur: NEGATIVE
Specific Gravity, Urine: 1.01
Urobilinogen, UA: 1

## 2011-03-26 LAB — PROTIME-INR: Prothrombin Time: 36.1 — ABNORMAL HIGH

## 2011-03-26 LAB — ABO/RH: ABO/RH(D): O NEG

## 2011-04-05 LAB — DIFFERENTIAL
Basophils Absolute: 0 10*3/uL (ref 0.0–0.1)
Basophils Relative: 0 % (ref 0–1)
Eosinophils Absolute: 0.3 10*3/uL (ref 0.0–0.7)
Monocytes Absolute: 0.7 10*3/uL (ref 0.1–1.0)
Monocytes Relative: 6 % (ref 3–12)
Neutrophils Relative %: 71 % (ref 43–77)

## 2011-04-05 LAB — CBC
HCT: 36.9 % (ref 36.0–46.0)
Hemoglobin: 12.5 g/dL (ref 12.0–15.0)
MCHC: 33.9 g/dL (ref 30.0–36.0)
MCV: 91.5 fL (ref 78.0–100.0)
RBC: 4.04 MIL/uL (ref 3.87–5.11)
RDW: 13 % (ref 11.5–15.5)

## 2011-04-05 LAB — COMPREHENSIVE METABOLIC PANEL
ALT: 24 U/L (ref 0–35)
Alkaline Phosphatase: 56 U/L (ref 39–117)
BUN: 14 mg/dL (ref 6–23)
CO2: 27 mEq/L (ref 19–32)
Chloride: 101 mEq/L (ref 96–112)
Glucose, Bld: 133 mg/dL — ABNORMAL HIGH (ref 70–99)
Potassium: 3.6 mEq/L (ref 3.5–5.1)
Sodium: 140 mEq/L (ref 135–145)
Total Bilirubin: 1.5 mg/dL — ABNORMAL HIGH (ref 0.3–1.2)
Total Protein: 7 g/dL (ref 6.0–8.3)

## 2011-04-05 LAB — POCT CARDIAC MARKERS: Troponin i, poc: <0.05 ng/mL (ref 0.00–0.09)

## 2011-04-05 LAB — URINALYSIS, ROUTINE W REFLEX MICROSCOPIC
Glucose, UA: NEGATIVE mg/dL
Hgb urine dipstick: NEGATIVE
Ketones, ur: NEGATIVE mg/dL
Protein, ur: NEGATIVE mg/dL
Urobilinogen, UA: 0.2 mg/dL (ref 0.0–1.0)

## 2011-04-05 LAB — CK TOTAL AND CKMB (NOT AT ARMC): Total CK: 76 U/L (ref 7–177)

## 2011-05-22 ENCOUNTER — Ambulatory Visit: Payer: Medicare Other | Attending: Geriatric Medicine | Admitting: Physical Therapy

## 2011-05-22 DIAGNOSIS — H8110 Benign paroxysmal vertigo, unspecified ear: Secondary | ICD-10-CM | POA: Insufficient documentation

## 2011-05-22 DIAGNOSIS — IMO0001 Reserved for inherently not codable concepts without codable children: Secondary | ICD-10-CM | POA: Insufficient documentation

## 2011-05-22 DIAGNOSIS — R269 Unspecified abnormalities of gait and mobility: Secondary | ICD-10-CM | POA: Insufficient documentation

## 2011-05-29 ENCOUNTER — Ambulatory Visit: Payer: Medicare Other | Admitting: Physical Therapy

## 2011-06-04 ENCOUNTER — Ambulatory Visit: Payer: Medicare Other | Attending: Geriatric Medicine | Admitting: Physical Therapy

## 2011-06-04 DIAGNOSIS — H8110 Benign paroxysmal vertigo, unspecified ear: Secondary | ICD-10-CM | POA: Insufficient documentation

## 2011-06-04 DIAGNOSIS — R269 Unspecified abnormalities of gait and mobility: Secondary | ICD-10-CM | POA: Insufficient documentation

## 2011-06-04 DIAGNOSIS — IMO0001 Reserved for inherently not codable concepts without codable children: Secondary | ICD-10-CM | POA: Insufficient documentation

## 2011-06-07 ENCOUNTER — Encounter: Payer: Medicare Other | Admitting: Physical Therapy

## 2011-07-05 ENCOUNTER — Other Ambulatory Visit: Payer: Self-pay | Admitting: Geriatric Medicine

## 2011-07-05 DIAGNOSIS — I729 Aneurysm of unspecified site: Secondary | ICD-10-CM | POA: Diagnosis not present

## 2011-07-05 DIAGNOSIS — Z0000 Encounter for general adult medical examination without abnormal findings: Secondary | ICD-10-CM | POA: Diagnosis not present

## 2011-07-05 DIAGNOSIS — E7800 Pure hypercholesterolemia, unspecified: Secondary | ICD-10-CM | POA: Diagnosis not present

## 2011-07-05 DIAGNOSIS — Z79899 Other long term (current) drug therapy: Secondary | ICD-10-CM | POA: Diagnosis not present

## 2011-07-05 DIAGNOSIS — I129 Hypertensive chronic kidney disease with stage 1 through stage 4 chronic kidney disease, or unspecified chronic kidney disease: Secondary | ICD-10-CM | POA: Diagnosis not present

## 2011-07-07 ENCOUNTER — Ambulatory Visit
Admission: RE | Admit: 2011-07-07 | Discharge: 2011-07-07 | Disposition: A | Discharge status: Home / Self Care | Payer: Medicare Other | Source: Ambulatory Visit | Attending: Geriatric Medicine | Admitting: Geriatric Medicine

## 2011-07-07 DIAGNOSIS — I671 Cerebral aneurysm, nonruptured: Secondary | ICD-10-CM | POA: Diagnosis not present

## 2011-07-07 DIAGNOSIS — I729 Aneurysm of unspecified site: Secondary | ICD-10-CM

## 2011-07-19 DIAGNOSIS — Z1231 Encounter for screening mammogram for malignant neoplasm of breast: Secondary | ICD-10-CM | POA: Diagnosis not present

## 2011-07-31 ENCOUNTER — Ambulatory Visit (INDEPENDENT_AMBULATORY_CARE_PROVIDER_SITE_OTHER): Payer: Medicare Other | Admitting: Internal Medicine

## 2011-07-31 ENCOUNTER — Encounter: Payer: Self-pay | Admitting: Internal Medicine

## 2011-07-31 VITALS — BP 118/62 | HR 72 | Ht 60.0 in | Wt 181.8 lb

## 2011-07-31 DIAGNOSIS — Z86010 Personal history of colonic polyps: Secondary | ICD-10-CM

## 2011-07-31 DIAGNOSIS — K5900 Constipation, unspecified: Secondary | ICD-10-CM

## 2011-07-31 NOTE — Patient Instructions (Signed)
We have put you in for a Colonoscopy recall for July 2013 and you will hear from our office around that time.

## 2011-07-31 NOTE — Progress Notes (Signed)
Subjective:    Patient ID: [name], female    DOB: 09/08/1927, 78 y.o.   MRN: [id number]  HPI This pleasant elderly woman presents to discuss repeat colonoscopy. She has had colon polyps over the years. I performed her last colonoscopy in 2009. My retired partner had performed her others. Earliest colonoscopy and the paper chart is 1991 and she had polyps up to a centimeter at that time. She has had adenomatous polyps over the years since then. 3 small adenomas in 2009. She had an advanced adenoma, 14 mm, in 2006 prior to that. Her brother may have colon cancer also. She's had no major change in bowel habits, some recent constipation, S. been on and off again phenomenon, there was no persistent change in stool caliber and to prunes a day have taken care of things nicely with good defecation pattern now. No rectal bleeding. Allergies  Allergen Reactions  . Atorvastatin     REACTION: shortness of breath, cramping in back  . Clopidogrel Bisulfate     REACTION: shortness of breath, cramping in back  . Codeine     REACTION: vomiting  . Sulfonamide Derivatives     REACTION: intestinal upset   Outpatient Prescriptions Prior to Visit  Medication Sig Dispense Refill  . acetaminophen (TYLENOL) 325 MG tablet Take 650 mg by mouth as needed.      [name] Kitchen albuterol (PROVENTIL HFA;VENTOLIN HFA) 108 (90 BASE) MCG/ACT inhaler Inhale 2 puffs into the lungs as needed.      [name] Kitchen aspirin 81 MG tablet Take 160 mg by mouth daily.      . calcium gluconate 500 MG tablet Take 1,000 mg by mouth daily.      . fexofenadine (ALLEGRA) 180 MG tablet Take 180 mg by mouth daily.      [name] Kitchen losartan (COZAAR) 25 MG tablet Take 25 mg by mouth daily.      [name] Kitchen lovastatin (ALTOPREV) 20 MG 24 hr tablet Take 20 mg by mouth at bedtime.      . metoprolol (LOPRESSOR) 50 MG tablet Take 50 mg by mouth daily.      . Omega-3 Fatty Acids (FISH OIL) 1200 MG CAPS Take 1 capsule by mouth daily.      [name] Kitchen triamterene-hydrochlorothiazide (DYAZIDE)  37.5-25 MG per capsule Take 1 capsule by mouth every morning.      [name] Kitchen alendronate (FOSAMAX) 70 MG tablet Take 70 mg by mouth every 7 (seven) days. Take with a full glass of water on an empty stomach.       Past Medical History  Diagnosis Date  . Arthritis   . Asthma   . CAD (coronary artery disease)   . Adenomatous colon polyp   . Diverticulosis   . HLD (hyperlipidemia)   . Obesity   . Internal hemorrhoids    osteoporosis Past Surgical History  Procedure Date  . Appendectomy   . Cholecystectomy   . Coronary angioplasty with stent placement   . Colonoscopy w/ biopsies and polypectomy 06/15/2008    adenomatous polyps, diverticulosis, internal hemorrhoids  . Total hip arthroplasty 09/08/2007    left, Dr. Lequita Halt          Review of Systems She remains fit and active, volunteers at the hospital. Lives independently with her husband.    Objective:   Physical Exam Elderly, overweight 2 obese no acute distress.       Assessment & Plan:   1. PERSONAL HX COLONIC POLYPS   2. Unspecified constipation    She is  just over 3 years out from her last colonoscopy. She is a long history of recurrent adenomatous polyps and a probable or possible family history of colon cancer. She is 80 however and there is diminishing benefit to routine preventive colonoscopy as time goes on. On the other hand she is fit and active. We discussed the pros and cons of observation versus routine repeat colonoscopy including the risks of perforation and death from the procedure. She prefers to have a colonoscopy later in the year so we have agreed to put her in for a recall in July and sent her a letter in the summer. She think September would be a good time to have a repeat colonoscopy. As far as the screening and surveillance intervals, up this is in the 3-5 year range and I think acceptable.  She will continue her prunes for constipation.

## 2011-08-02 ENCOUNTER — Ambulatory Visit: Payer: Medicare Other | Attending: Geriatric Medicine | Admitting: Physical Therapy

## 2011-08-02 DIAGNOSIS — IMO0001 Reserved for inherently not codable concepts without codable children: Secondary | ICD-10-CM | POA: Diagnosis not present

## 2011-08-02 DIAGNOSIS — H8110 Benign paroxysmal vertigo, unspecified ear: Secondary | ICD-10-CM | POA: Insufficient documentation

## 2011-08-02 DIAGNOSIS — R269 Unspecified abnormalities of gait and mobility: Secondary | ICD-10-CM | POA: Diagnosis not present

## 2011-08-14 DIAGNOSIS — M169 Osteoarthritis of hip, unspecified: Secondary | ICD-10-CM | POA: Diagnosis not present

## 2011-08-26 ENCOUNTER — Other Ambulatory Visit: Payer: Self-pay

## 2011-08-26 ENCOUNTER — Encounter (HOSPITAL_COMMUNITY): Payer: Self-pay | Admitting: *Deleted

## 2011-08-26 ENCOUNTER — Emergency Department (HOSPITAL_COMMUNITY)
Admission: EM | Admit: 2011-08-26 | Discharge: 2011-08-27 | Disposition: A | Discharge status: Home / Self Care | Payer: Medicare Other | Attending: Emergency Medicine | Admitting: Emergency Medicine

## 2011-08-26 DIAGNOSIS — E785 Hyperlipidemia, unspecified: Secondary | ICD-10-CM | POA: Diagnosis not present

## 2011-08-26 DIAGNOSIS — R002 Palpitations: Secondary | ICD-10-CM | POA: Insufficient documentation

## 2011-08-26 DIAGNOSIS — R0602 Shortness of breath: Secondary | ICD-10-CM | POA: Diagnosis not present

## 2011-08-26 DIAGNOSIS — I10 Essential (primary) hypertension: Secondary | ICD-10-CM | POA: Diagnosis not present

## 2011-08-26 DIAGNOSIS — I2510 Atherosclerotic heart disease of native coronary artery without angina pectoris: Secondary | ICD-10-CM | POA: Diagnosis not present

## 2011-08-26 DIAGNOSIS — J45909 Unspecified asthma, uncomplicated: Secondary | ICD-10-CM | POA: Diagnosis not present

## 2011-08-26 NOTE — ED Notes (Signed)
Pt in c/o hypertension and feeling like her heart is racing, states she checks her BP at home and it was elevated tonight, states she just doesn't feel right

## 2011-08-27 ENCOUNTER — Emergency Department (HOSPITAL_COMMUNITY): Payer: Medicare Other

## 2011-08-27 DIAGNOSIS — J45909 Unspecified asthma, uncomplicated: Secondary | ICD-10-CM | POA: Diagnosis not present

## 2011-08-27 DIAGNOSIS — I10 Essential (primary) hypertension: Secondary | ICD-10-CM | POA: Diagnosis not present

## 2011-08-27 DIAGNOSIS — R0602 Shortness of breath: Secondary | ICD-10-CM | POA: Diagnosis not present

## 2011-08-27 LAB — COMPREHENSIVE METABOLIC PANEL
Alkaline Phosphatase: 57 U/L (ref 39–117)
BUN: 21 mg/dL (ref 6–23)
CO2: 26 mEq/L (ref 19–32)
Chloride: 101 mEq/L (ref 96–112)
GFR calc Af Amer: 60 mL/min — ABNORMAL LOW (ref >90)
GFR calc non Af Amer: 52 mL/min — ABNORMAL LOW (ref >90)
Glucose, Bld: 165 mg/dL — ABNORMAL HIGH (ref 70–99)
Potassium: 3.7 mEq/L (ref 3.5–5.1)
Total Bilirubin: 0.5 mg/dL (ref 0.3–1.2)

## 2011-08-27 LAB — CARDIAC PANEL(CRET KIN+CKTOT+MB+TROPI)
CK, MB: 2.8 ng/mL (ref 0.3–4.0)
Troponin I: <0.3 ng/mL (ref <0.30)

## 2011-08-27 LAB — CBC
HCT: 33 % — ABNORMAL LOW (ref 36.0–46.0)
Hemoglobin: 10.5 g/dL — ABNORMAL LOW (ref 12.0–15.0)
WBC: 9.6 10*3/uL (ref 4.0–10.5)

## 2011-08-27 MED ORDER — SODIUM CHLORIDE 0.9 % IV SOLN
INTRAVENOUS | Status: DC
  Administered 2011-08-27: 02:00:00 via INTRAVENOUS

## 2011-08-27 NOTE — Discharge Instructions (Signed)
Arterial Hypertension Arterial hypertension (high blood pressure) is a condition of elevated pressure in your blood vessels. Hypertension over a long period of time is a risk factor for strokes, heart attacks, and heart failure. It is also the leading cause of kidney (renal) failure.  CAUSES   In Adults -- Over 90% of all hypertension has no known cause. This is called essential or primary hypertension. In the other 10% of people with hypertension, the increase in blood pressure is caused by another disorder. This is called secondary hypertension. Important causes of secondary hypertension are:   Heavy alcohol use.   Obstructive sleep apnea.   Hyperaldosterosim (Conn's syndrome).   Steroid use.   Chronic kidney failure.   Hyperparathyroidism.   Medications.   Renal artery stenosis.   Pheochromocytoma.   Cushing's disease.   Coarctation of the aorta.   Scleroderma renal crisis.   Licorice (in excessive amounts).   Drugs (cocaine, methamphetamine).  Your caregiver can explain any items above that apply to you.  In Children -- Secondary hypertension is more common and should always be considered.   Pregnancy -- Few women of childbearing age have high blood pressure. However, up to 10% of them develop hypertension of pregnancy. Generally, this will not harm the woman. It Even be a sign of 3 complications of pregnancy: preeclampsia, HELLP syndrome, and eclampsia. Follow up and control with medication is necessary.  SYMPTOMS   This condition normally does not produce any noticeable symptoms. It is usually found during a routine exam.   Malignant hypertension is a late problem of high blood pressure. It Monger have the following symptoms:   Headaches.   Blurred vision.   End-organ damage (this means your kidneys, heart, lungs, and other organs are being damaged).   Stressful situations can increase the blood pressure. If a person with normal blood pressure has their blood  pressure go up while being seen by their caregiver, this is often termed "white coat hypertension." Its importance is not known. It Alkire be related with eventually developing hypertension or complications of hypertension.   Hypertension is often confused with mental tension, stress, and anxiety.  DIAGNOSIS  The diagnosis is made by 3 separate blood pressure measurements. They are taken at least 1 week apart from each other. If there is organ damage from hypertension, the diagnosis Lemire be made without repeat measurements. Hypertension is usually identified by having blood pressure readings:  Above 140/90 mmHg measured in both arms, at 3 separate times, over a couple weeks.   Over 130/80 mmHg should be considered a risk factor and Grisanti require treatment in patients with diabetes.  Blood pressure readings over 120/80 mmHg are called "pre-hypertension" even in non-diabetic patients. To get a true blood pressure measurement, use the following guidelines. Be aware of the factors that can alter blood pressure readings.  Take measurements at least 1 hour after caffeine.   Take measurements 30 minutes after smoking and without any stress. This is another reason to quit smoking - it raises your blood pressure.   Use a proper cuff size. Ask your caregiver if you are not sure about your cuff size.   Most home blood pressure cuffs are automatic. They will measure systolic and diastolic pressures. The systolic pressure is the pressure reading at the start of sounds. Diastolic pressure is the pressure at which the sounds disappear. If you are elderly, measure pressures in multiple postures. Try sitting, lying or standing.   Sit at rest for a minimum of   5 minutes before taking measurements.   You should not be on any medications like decongestants. These are found in many cold medications.   Record your blood pressure readings and review them with your caregiver.  If you have hypertension:  Your caregiver  may do tests to be sure you do not have secondary hypertension (see "causes" above).   Your caregiver may also look for signs of metabolic syndrome. This is also called Syndrome X or Insulin Resistance Syndrome. You may have this syndrome if you have type 2 diabetes, abdominal obesity, and abnormal blood lipids in addition to hypertension.   Your caregiver will take your medical and family history and perform a physical exam.   Diagnostic tests may include blood tests (for glucose, cholesterol, potassium, and kidney function), a urinalysis, or an EKG. Other tests may also be necessary depending on your condition.  PREVENTION  There are important lifestyle issues that you can adopt to reduce your chance of developing hypertension:  Maintain a normal weight.   Limit the amount of salt (sodium) in your diet.   Exercise often.   Limit alcohol intake.   Get enough potassium in your diet. Discuss specific advice with your caregiver.   Follow a DASH diet (dietary approaches to stop hypertension). This diet is rich in fruits, vegetables, and low-fat dairy products, and avoids certain fats.  PROGNOSIS  Essential hypertension cannot be cured. Lifestyle changes and medical treatment can lower blood pressure and reduce complications. The prognosis of secondary hypertension depends on the underlying cause. Many people whose hypertension is controlled with medicine or lifestyle changes can live a normal, healthy life.  RISKS AND COMPLICATIONS  While high blood pressure alone is not an illness, it often requires treatment due to its short- and long-term effects on many organs. Hypertension increases your risk for:  CVAs or strokes (cerebrovascular accident).   Heart failure due to chronically high blood pressure (hypertensive cardiomyopathy).   Heart attack (myocardial infarction).   Damage to the retina (hypertensive retinopathy).   Kidney failure (hypertensive nephropathy).  Your caregiver can  explain list items above that apply to you. Treatment of hypertension can significantly reduce the risk of complications. TREATMENT   For overweight patients, weight loss and regular exercise are recommended. Physical fitness lowers blood pressure.   Mild hypertension is usually treated with diet and exercise. A diet rich in fruits and vegetables, fat-free dairy products, and foods low in fat and salt (sodium) can help lower blood pressure. Decreasing salt intake decreases blood pressure in a 1/3 of people.   Stop smoking if you are a smoker.  The steps above are highly effective in reducing blood pressure. While these actions are easy to suggest, they are difficult to achieve. Most patients with moderate or severe hypertension end up requiring medications to bring their blood pressure down to a normal level. There are several classes of medications for treatment. Blood pressure pills (antihypertensives) will lower blood pressure by their different actions. Lowering the blood pressure by 10 mmHg may decrease the risk of complications by as much as 25%. The goal of treatment is effective blood pressure control. This will reduce your risk for complications. Your caregiver will help you determine the best treatment for you according to your lifestyle. What is excellent treatment for one person, may not be for you. HOME CARE INSTRUCTIONS   Do not smoke.   Follow the lifestyle changes outlined in the "Prevention" section.   If you are on medications, follow the directions   carefully. Blood pressure medications must be taken as prescribed. Skipping doses reduces their benefit. It also puts you at risk for problems.   Follow up with your caregiver, as directed.   If you are asked to monitor your blood pressure at home, follow the guidelines in the "Diagnosis" section above.  SEEK MEDICAL CARE IF:   You think you are having medication side effects.   You have recurrent headaches or lightheadedness.     You have swelling in your ankles.   You have trouble with your vision.  SEEK IMMEDIATE MEDICAL CARE IF:   You have sudden onset of chest pain or pressure, difficulty breathing, or other symptoms of a heart attack.   You have a severe headache.   You have symptoms of a stroke (such as sudden weakness, difficulty speaking, difficulty walking).  MAKE SURE YOU:   Understand these instructions.   Will watch your condition.   Will get help right away if you are not doing well or get worse.  Document Released: 06/18/2005 Document Revised: 02/28/2011 Document Reviewed: 01/16/2007 [facility] Patient Information 2012 [place], [place].Hypertension Information As your heart beats, it forces blood through your arteries. This force is your blood pressure. If the pressure is too high, it is called hypertension (HTN) or high blood pressure. HTN is dangerous because you may have it and not know it. High blood pressure may mean that your heart has to work harder to pump blood. Your arteries may be narrow or stiff. The extra work puts you at risk for heart disease, stroke, and other problems.  Blood pressure consists of two numbers, a higher number over a lower, 110/72, for example. It is stated as "110 over 72." The ideal is below 120 for the top number (systolic) and under 80 for the bottom (diastolic).  You should pay close attention to your blood pressure if you have certain conditions such as:  Heart failure.   Prior heart attack.   Diabetes   Chronic kidney disease.   Prior stroke.   Multiple risk factors for heart disease.  To see if you have HTN, your blood pressure should be measured while you are seated with your arm held at the level of the heart. It should be measured at least twice. A one-time elevated blood pressure reading (especially in the Emergency Department) does not mean that you need treatment. There may be conditions in which the blood pressure is different between your right  and left arms. It is important to see your caregiver soon for a recheck. Most people have essential hypertension which means that there is not a specific cause. This type of high blood pressure may be lowered by changing lifestyle factors such as:  Stress.   Smoking.   Lack of exercise.   Excessive weight.   Drug/tobacco/alcohol use.   Eating less salt.  Most people do not have symptoms from high blood pressure until it has caused damage to the body. Effective treatment can often prevent, delay or reduce that damage. TREATMENT  Treatment for high blood pressure, when a cause has been identified, is directed at the cause. There are a large number of medications to treat HTN. These fall into several categories, and your caregiver will help you select the medicines that are best for you. Medications may have side effects. You should review side effects with your caregiver. If your blood pressure stays high after you have made lifestyle changes or started on medicines,   Your medication(s) may need to be  changed.   Other problems may need to be addressed.   Be certain you understand your prescriptions, and know how and when to take your medicine.   Be sure to follow up with your caregiver within the time frame advised (usually within two weeks) to have your blood pressure rechecked and to review your medications.   If you are taking more than one medicine to lower your blood pressure, make sure you know how and at what times they should be taken. Taking two medicines at the same time can result in blood pressure that is too low.  Document Released: 08/21/2005 Document Revised: 02/28/2011 Document Reviewed: 08/28/2007 [employer] Patient Information 2012 [employer]. 

## 2011-08-27 NOTE — ED Provider Notes (Signed)
History     CSN: [id number]  Arrival date & time 08/26/11  2216   First MD Initiated Contact with Patient 08/27/11 0155      Chief Complaint  Patient presents with  . Hypertension  . Palpitations    (Consider location/radiation/quality/duration/timing/severity/associated sxs/prior treatment) HPI Comments: Patient here with elevated blood pressure today - states that she has chronic shoulder pain and asthma so she was concerned when the pressure was up.  She states that initially she felt like her heart was racing as well - but none now - reports no headache, nausea, vomiting, chest pain or tightness, shortness of breath, abdominal pain, diarrhea.  She reports compliance with taking her blood pressure medication.  Patient is a 78 y.o. female presenting with hypertension and palpitations. The history is provided by the patient. No language interpreter was used.  Hypertension This is a new problem. The current episode started today. The problem occurs constantly. The problem has been unchanged. Pertinent negatives include no abdominal pain, anorexia, arthralgias, change in bowel habit, chest pain, chills, congestion, coughing, diaphoresis, fatigue, fever, headaches, joint swelling, myalgias, nausea, neck pain, numbness, rash, sore throat, swollen glands, urinary symptoms, vertigo, visual change, vomiting or weakness. The symptoms are aggravated by nothing. The treatment provided no relief.  Palpitations  Pertinent negatives include no diaphoresis, no fever, no numbness, no chest pain, no abdominal pain, no nausea, no vomiting, no headaches, no weakness and no cough.  Hypertension This is a new problem. The current episode started today. The problem occurs constantly. The problem has been unchanged. Pertinent negatives include no chest pain, no abdominal pain and no headaches. The symptoms are aggravated by nothing. The treatment provided no relief.    Past Medical History  Diagnosis Date  .  Arthritis   . Asthma   . CAD (coronary artery disease)   . Adenomatous colon polyp   . Diverticulosis   . HLD (hyperlipidemia)   . Obesity   . Internal hemorrhoids   . Osteoporosis     Past Surgical History  Procedure Date  . Appendectomy   . Cholecystectomy   . Coronary angioplasty with stent placement   . Colonoscopy w/ biopsies and polypectomy 06/15/2008    adenomatous polyps, diverticulosis, internal hemorrhoids  . Total hip arthroplasty 09/08/2007    left, Dr. Lequita Halt    Family History  Problem Relation Age of Onset  . Pancreatic cancer Father   . Pancreatic cancer Brother   . Heart disease Mother   . Heart disease Sister   . Colon cancer Brother     ? Colostomy    History  Substance Use Topics  . Smoking status: Former Smoker -- 30 years    Types: Cigarettes  . Smokeless tobacco: Never Used  . Alcohol Use: Yes     occ wine    OB History    Grav Para Term Preterm Abortions TAB SAB Ect Mult Living                  Review of Systems  Constitutional: Negative for fever, chills, diaphoresis and fatigue.  HENT: Negative for congestion, sore throat and neck pain.   Respiratory: Negative for cough.   Cardiovascular: Positive for palpitations. Negative for chest pain.  Gastrointestinal: Negative for nausea, vomiting, abdominal pain, anorexia and change in bowel habit.  Musculoskeletal: Negative for myalgias, joint swelling and arthralgias.  Skin: Negative for rash.  Neurological: Negative for vertigo, weakness, numbness and headaches.  All other systems reviewed and are negative.  Allergies  Atorvastatin; Clopidogrel bisulfate; Codeine; and Sulfonamide derivatives  Home Medications   Current Outpatient Rx  Name Route Sig Dispense Refill  . ACETAMINOPHEN 325 MG PO TABS Oral Take 650 mg by mouth as needed.    . ALBUTEROL SULFATE HFA 108 (90 BASE) MCG/ACT IN AERS Inhalation Inhale 2 puffs into the lungs as needed.    . ASPIRIN 81 MG PO TABS Oral Take  160 mg by mouth daily.    [name] Kitchen CALCIUM GLUCONATE 500 MG PO TABS Oral Take 1,000 mg by mouth daily.    [name] Kitchen FEXOFENADINE HCL 180 MG PO TABS Oral Take 180 mg by mouth daily.    [name] Kitchen LOVASTATIN ER 20 MG PO TB24 Oral Take 20 mg by mouth at bedtime.    [name] Kitchen METOPROLOL TARTRATE 50 MG PO TABS Oral Take 50 mg by mouth daily.    [name] Kitchen FISH OIL 1200 MG PO CAPS Oral Take 1 capsule by mouth daily.    . TRIAMTERENE-HCTZ 37.5-25 MG PO CAPS Oral Take 1 capsule by mouth every morning.      BP 131/50  Pulse 83  Temp 98 F (36.7 C)  Resp 20  SpO2 98%  Physical Exam  Nursing note and vitals reviewed. Constitutional: She is oriented to person, place, and time. She appears well-developed and well-nourished. No distress.  HENT:  Head: Normocephalic and atraumatic.  Right Ear: External ear normal.  Left Ear: External ear normal.  Nose: Nose normal.  Mouth/Throat: Oropharynx is clear and moist. No oropharyngeal exudate.  Eyes: Conjunctivae are normal. Pupils are equal, round, and reactive to light. No scleral icterus.  Neck: Normal range of motion. Neck supple.  Cardiovascular: Normal rate, regular rhythm and normal heart sounds.  Exam reveals no gallop and no friction rub.   No murmur heard. Pulmonary/Chest: Effort normal and breath sounds normal. No respiratory distress. She exhibits no tenderness.  Abdominal: Soft. Bowel sounds are normal. She exhibits no distension. There is no tenderness.  Musculoskeletal: Normal range of motion. She exhibits no edema and no tenderness.  Lymphadenopathy:    She has no cervical adenopathy.  Neurological: She is alert and oriented to person, place, and time. No cranial nerve deficit.  Skin: Skin is warm and dry. No rash noted. No erythema. No pallor.  Psychiatric: She has a normal mood and affect. Her behavior is normal. Judgment and thought content normal.    ED Course  Procedures (including critical care time)  Labs Reviewed  CBC - Abnormal; Notable for the following:     RBC 3.74 (*)    Hemoglobin 10.5 (*)    HCT 33.0 (*)    All other components within normal limits  COMPREHENSIVE METABOLIC PANEL - Abnormal; Notable for the following:    Glucose, Bld 165 (*)    GFR calc non Af Amer 52 (*)    GFR calc Af Amer 60 (*)    All other components within normal limits  CARDIAC PANEL(CRET KIN+CKTOT+MB+TROPI)  URINALYSIS, ROUTINE W REFLEX MICROSCOPIC   Dg Chest 2 View  08/27/2011  *RADIOLOGY REPORT*  Clinical Data: Shortness of breath; history of asthma and hypertension.  CHEST - 2 VIEW  Comparison: Chest radiograph performed 06/27/2008  Findings: Lung expansion is mildly decreased.  Minimal left basilar scarring is noted.  There is no evidence of focal opacification, pleural effusion or pneumothorax.  The heart is normal in size; the mediastinal contour is within normal limits.  No acute osseous abnormalities are seen.  Mild degenerative change is noted along  the thoracic and upper lumbar spine; calcification is seen along the abdominal aorta.  IMPRESSION:  1.  Lungs mildly hypoexpanded but clear. 2.  Calcification noted along the abdominal aorta.  Original Report Authenticated By: Tonia Ghent, M.D.   Results for orders placed during the hospital encounter of 08/26/11  CBC      Component Value Range   WBC 9.6  4.0 - 10.5 (K/uL)   RBC 3.74 (*) 3.87 - 5.11 (MIL/uL)   Hemoglobin 10.5 (*) 12.0 - 15.0 (g/dL)   HCT 16.1 (*) 09.6 - 46.0 (%)   MCV 88.2  78.0 - 100.0 (fL)   MCH 28.1  26.0 - 34.0 (pg)   MCHC 31.8  30.0 - 36.0 (g/dL)   RDW 04.5  40.9 - 81.1 (%)   Platelets 284  150 - 400 (K/uL)  COMPREHENSIVE METABOLIC PANEL      Component Value Range   Sodium 137  135 - 145 (mEq/L)   Potassium 3.7  3.5 - 5.1 (mEq/L)   Chloride 101  96 - 112 (mEq/L)   CO2 26  19 - 32 (mEq/L)   Glucose, Bld 165 (*) 70 - 99 (mg/dL)   BUN 21  6 - 23 (mg/dL)   Creatinine, Ser 9.14  0.50 - 1.10 (mg/dL)   Calcium 9.7  8.4 - 78.2 (mg/dL)   Total Protein 6.9  6.0 - 8.3 (g/dL)   Albumin  3.6  3.5 - 5.2 (g/dL)   AST 19  0 - 37 (U/L)   ALT 16  0 - 35 (U/L)   Alkaline Phosphatase 57  39 - 117 (U/L)   Total Bilirubin 0.5  0.3 - 1.2 (mg/dL)   GFR calc non Af Amer 52 (*) >90 (mL/min)   GFR calc Af Amer 60 (*) >90 (mL/min)  CARDIAC PANEL(CRET KIN+CKTOT+MB+TROPI)      Component Value Range   Total CK 64  7 - 177 (U/L)   CK, MB 2.8  0.3 - 4.0 (ng/mL)   Troponin I <0.30  <0.30 (ng/mL)   Relative Index RELATIVE INDEX IS INVALID  0.0 - 2.5    Dg Chest 2 View  08/27/2011  *RADIOLOGY REPORT*  Clinical Data: Shortness of breath; history of asthma and hypertension.  CHEST - 2 VIEW  Comparison: Chest radiograph performed 06/27/2008  Findings: Lung expansion is mildly decreased.  Minimal left basilar scarring is noted.  There is no evidence of focal opacification, pleural effusion or pneumothorax.  The heart is normal in size; the mediastinal contour is within normal limits.  No acute osseous abnormalities are seen.  Mild degenerative change is noted along the thoracic and upper lumbar spine; calcification is seen along the abdominal aorta.  IMPRESSION:  1.  Lungs mildly hypoexpanded but clear. 2.  Calcification noted along the abdominal aorta.  Original Report Authenticated By: Tonia Ghent, M.D.     Date: 08/27/2011  Rate: 87  Rhythm: normal sinus rhythm  QRS Axis: normal  Intervals: normal  ST/T Wave abnormalities: normal  Conduction Disutrbances:none  Narrative Interpretation: Reviewed by Dr. Dierdre Highman  Old EKG Reviewed: none available    Hypertension    MDM  Repeat blood pressure here was 150/86 - patient without signs of end organ damage, ecg normal, patient feels better medication.        Izola Price [place], [place] 08/27/11 0354  Medical screening examination/treatment/procedure(s) were conducted as a shared visit with non-physician practitioner(s) and myself.  I personally evaluated the patient during the encounter EKG reviewed. Patient evaluated at 3:50  AM. She is  feeling well without complaints her blood pressures normalizing. She relates that she's not feeling herself earlier tonight but denies any symptoms suggestive of ACS. Workup as above unrevealing for emergent condition. Plan outpatient followup. Reliable historian verbalizes understanding strict return precautions and agrees to discharge and followup instructions.  Sunnie Nielsen, MD 08/27/11 [phone]

## 2011-08-27 NOTE — ED Notes (Signed)
Pt stated that she could not give urine specimen because she had just voided earlier. Needed more time.

## 2011-10-04 DIAGNOSIS — H8110 Benign paroxysmal vertigo, unspecified ear: Secondary | ICD-10-CM | POA: Diagnosis not present

## 2011-10-04 DIAGNOSIS — M542 Cervicalgia: Secondary | ICD-10-CM | POA: Diagnosis not present

## 2011-10-31 DIAGNOSIS — D500 Iron deficiency anemia secondary to blood loss (chronic): Secondary | ICD-10-CM

## 2011-10-31 DIAGNOSIS — C189 Malignant neoplasm of colon, unspecified: Secondary | ICD-10-CM

## 2011-10-31 DIAGNOSIS — D126 Benign neoplasm of colon, unspecified: Secondary | ICD-10-CM

## 2011-10-31 HISTORY — DX: Iron deficiency anemia secondary to blood loss (chronic): D50.0

## 2011-10-31 HISTORY — DX: Malignant neoplasm of colon, unspecified: C18.9

## 2011-10-31 HISTORY — DX: Benign neoplasm of colon, unspecified: D12.6

## 2011-11-01 DIAGNOSIS — M25559 Pain in unspecified hip: Secondary | ICD-10-CM | POA: Diagnosis not present

## 2011-11-01 DIAGNOSIS — M545 Low back pain: Secondary | ICD-10-CM | POA: Diagnosis not present

## 2011-11-03 DIAGNOSIS — M25559 Pain in unspecified hip: Secondary | ICD-10-CM | POA: Diagnosis not present

## 2011-11-13 DIAGNOSIS — R0602 Shortness of breath: Secondary | ICD-10-CM | POA: Diagnosis not present

## 2011-11-13 DIAGNOSIS — J309 Allergic rhinitis, unspecified: Secondary | ICD-10-CM | POA: Diagnosis not present

## 2011-11-14 ENCOUNTER — Inpatient Hospital Stay (HOSPITAL_COMMUNITY)
Admission: EM | Admit: 2011-11-14 | Discharge: 2011-11-16 | DRG: 376 | Disposition: A | Discharge status: Home / Self Care | Payer: Medicare Other | Source: Ambulatory Visit | Attending: Internal Medicine | Admitting: Internal Medicine

## 2011-11-14 ENCOUNTER — Encounter (HOSPITAL_COMMUNITY): Payer: Self-pay | Admitting: *Deleted

## 2011-11-14 DIAGNOSIS — R0602 Shortness of breath: Secondary | ICD-10-CM | POA: Diagnosis not present

## 2011-11-14 DIAGNOSIS — D500 Iron deficiency anemia secondary to blood loss (chronic): Secondary | ICD-10-CM | POA: Diagnosis present

## 2011-11-14 DIAGNOSIS — Z9861 Coronary angioplasty status: Secondary | ICD-10-CM

## 2011-11-14 DIAGNOSIS — K5730 Diverticulosis of large intestine without perforation or abscess without bleeding: Secondary | ICD-10-CM | POA: Diagnosis not present

## 2011-11-14 DIAGNOSIS — M810 Age-related osteoporosis without current pathological fracture: Secondary | ICD-10-CM | POA: Diagnosis present

## 2011-11-14 DIAGNOSIS — E785 Hyperlipidemia, unspecified: Secondary | ICD-10-CM | POA: Diagnosis present

## 2011-11-14 DIAGNOSIS — M129 Arthropathy, unspecified: Secondary | ICD-10-CM | POA: Diagnosis not present

## 2011-11-14 DIAGNOSIS — J45909 Unspecified asthma, uncomplicated: Secondary | ICD-10-CM | POA: Diagnosis not present

## 2011-11-14 DIAGNOSIS — Z860100 Personal history of colon polyps, unspecified: Secondary | ICD-10-CM

## 2011-11-14 DIAGNOSIS — Z87891 Personal history of nicotine dependence: Secondary | ICD-10-CM

## 2011-11-14 DIAGNOSIS — E669 Obesity, unspecified: Secondary | ICD-10-CM | POA: Diagnosis present

## 2011-11-14 DIAGNOSIS — I2510 Atherosclerotic heart disease of native coronary artery without angina pectoris: Secondary | ICD-10-CM | POA: Diagnosis present

## 2011-11-14 DIAGNOSIS — C182 Malignant neoplasm of ascending colon: Secondary | ICD-10-CM | POA: Diagnosis not present

## 2011-11-14 DIAGNOSIS — M545 Low back pain, unspecified: Secondary | ICD-10-CM | POA: Diagnosis present

## 2011-11-14 DIAGNOSIS — Z96649 Presence of unspecified artificial hip joint: Secondary | ICD-10-CM

## 2011-11-14 DIAGNOSIS — R7309 Other abnormal glucose: Secondary | ICD-10-CM | POA: Diagnosis present

## 2011-11-14 DIAGNOSIS — K6389 Other specified diseases of intestine: Secondary | ICD-10-CM

## 2011-11-14 DIAGNOSIS — D490 Neoplasm of unspecified behavior of digestive system: Secondary | ICD-10-CM | POA: Diagnosis not present

## 2011-11-14 DIAGNOSIS — D509 Iron deficiency anemia, unspecified: Secondary | ICD-10-CM

## 2011-11-14 DIAGNOSIS — R112 Nausea with vomiting, unspecified: Secondary | ICD-10-CM | POA: Diagnosis not present

## 2011-11-14 DIAGNOSIS — D649 Anemia, unspecified: Secondary | ICD-10-CM

## 2011-11-14 DIAGNOSIS — I10 Essential (primary) hypertension: Secondary | ICD-10-CM | POA: Diagnosis not present

## 2011-11-14 DIAGNOSIS — D539 Nutritional anemia, unspecified: Secondary | ICD-10-CM | POA: Diagnosis not present

## 2011-11-14 LAB — DIFFERENTIAL
Basophils Absolute: 0 10*3/uL (ref 0.0–0.1)
Eosinophils Absolute: 0.3 10*3/uL (ref 0.0–0.7)
Lymphs Abs: 1.6 10*3/uL (ref 0.7–4.0)
Neutrophils Relative %: 71 % (ref 43–77)

## 2011-11-14 LAB — APTT: aPTT: 30 seconds (ref 24–37)

## 2011-11-14 LAB — CARDIAC PANEL(CRET KIN+CKTOT+MB+TROPI)
CK, MB: 2.5 ng/mL (ref 0.3–4.0)
Relative Index: INVALID (ref 0.0–2.5)
Total CK: 82 U/L (ref 7–177)
Troponin I: <0.3 ng/mL (ref <0.30)

## 2011-11-14 LAB — COMPREHENSIVE METABOLIC PANEL
ALT: 15 U/L (ref 0–35)
AST: 21 U/L (ref 0–37)
Albumin: 3.9 g/dL (ref 3.5–5.2)
Alkaline Phosphatase: 49 U/L (ref 39–117)
Glucose, Bld: 194 mg/dL — ABNORMAL HIGH (ref 70–99)
Potassium: 3.4 mEq/L — ABNORMAL LOW (ref 3.5–5.1)
Sodium: 137 mEq/L (ref 135–145)
Total Protein: 7.2 g/dL (ref 6.0–8.3)

## 2011-11-14 LAB — CBC
HCT: 22.9 % — ABNORMAL LOW (ref 36.0–46.0)
Hemoglobin: 7 g/dL — ABNORMAL LOW (ref 12.0–15.0)
MCH: 23.4 pg — ABNORMAL LOW (ref 26.0–34.0)
Platelets: 386 10*3/uL (ref 150–400)
RBC: 3.37 MIL/uL — ABNORMAL LOW (ref 3.87–5.11)
RBC: 2.96 MIL/uL — ABNORMAL LOW (ref 3.87–5.11)
RDW: 14.2 % (ref 11.5–15.5)
WBC: 8.9 10*3/uL (ref 4.0–10.5)
WBC: 8.3 10*3/uL (ref 4.0–10.5)

## 2011-11-14 LAB — PREPARE RBC (CROSSMATCH)

## 2011-11-14 LAB — VITAMIN B12: Vitamin B-12: 907 pg/mL (ref 211–911)

## 2011-11-14 LAB — FOLATE: Folate: >20 ng/mL

## 2011-11-14 LAB — IRON AND TIBC
Iron: 49 ug/dL (ref 42–135)
Saturation Ratios: 8 % — ABNORMAL LOW (ref 20–55)
UIBC: 549 ug/dL — ABNORMAL HIGH (ref 125–400)

## 2011-11-14 LAB — RETICULOCYTES
RBC.: 3.32 MIL/uL — ABNORMAL LOW (ref 3.87–5.11)
Retic Count, Absolute: 93 10*3/uL (ref 19.0–186.0)

## 2011-11-14 MED ORDER — SODIUM CHLORIDE 0.9 % IJ SOLN
3.0000 mL | Freq: Two times a day (BID) | INTRAMUSCULAR | Status: DC
  Administered 2011-11-14 – 2011-11-15 (×3): 3 mL via INTRAVENOUS

## 2011-11-14 MED ORDER — SODIUM CHLORIDE 0.9 % IV SOLN: INTRAVENOUS | Status: DC

## 2011-11-14 MED ORDER — ONDANSETRON HCL 4 MG/2ML IJ SOLN: 4.0000 mg | Freq: Four times a day (QID) | INTRAMUSCULAR | Status: DC | PRN

## 2011-11-14 MED ORDER — ACETAMINOPHEN 650 MG RE SUPP: 650.0000 mg | Freq: Four times a day (QID) | RECTAL | Status: DC | PRN

## 2011-11-14 MED ORDER — ONDANSETRON HCL 4 MG PO TABS: 4.0000 mg | ORAL_TABLET | Freq: Four times a day (QID) | ORAL | Status: DC | PRN

## 2011-11-14 MED ORDER — LOVASTATIN ER 20 MG PO TB24: 20.0000 mg | ORAL_TABLET | Freq: Every day | ORAL | Status: DC

## 2011-11-14 MED ORDER — ALBUTEROL SULFATE HFA 108 (90 BASE) MCG/ACT IN AERS: 2.0000 | INHALATION_SPRAY | RESPIRATORY_TRACT | Status: DC | PRN

## 2011-11-14 MED ORDER — PANTOPRAZOLE SODIUM 40 MG IV SOLR
40.0000 mg | Freq: Two times a day (BID) | INTRAVENOUS | Status: DC
  Administered 2011-11-14 – 2011-11-15 (×2): 40 mg via INTRAVENOUS
  Filled 2011-11-14 (×3): qty 40

## 2011-11-14 MED ORDER — LOVASTATIN 20 MG PO TABS
20.0000 mg | ORAL_TABLET | Freq: Every day | ORAL | Status: DC
  Filled 2011-11-14: qty 1

## 2011-11-14 MED ORDER — SODIUM CHLORIDE 0.9 % IV BOLUS (SEPSIS)
1000.0000 mL | Freq: Once | INTRAVENOUS | Status: AC
  Administered 2011-11-14: 1000 mL via INTRAVENOUS

## 2011-11-14 MED ORDER — SODIUM CHLORIDE 0.9 % IV SOLN
INTRAVENOUS | Status: DC
  Administered 2011-11-14: via INTRAVENOUS

## 2011-11-14 MED ORDER — ACETAMINOPHEN 325 MG PO TABS: 650.0000 mg | ORAL_TABLET | Freq: Four times a day (QID) | ORAL | Status: DC | PRN

## 2011-11-14 MED ORDER — METOPROLOL TARTRATE 25 MG PO TABS
25.0000 mg | ORAL_TABLET | Freq: Two times a day (BID) | ORAL | Status: DC
  Administered 2011-11-14 – 2011-11-16 (×4): 25 mg via ORAL
  Filled 2011-11-14 (×6): qty 1

## 2011-11-14 MED ORDER — LOVASTATIN 20 MG PO TABS
20.0000 mg | ORAL_TABLET | Freq: Every day | ORAL | Status: DC
  Administered 2011-11-14 – 2011-11-15 (×2): 20 mg via ORAL
  Filled 2011-11-14 (×4): qty 1

## 2011-11-14 NOTE — ED Notes (Signed)
Patient signed blood transfusion consent form.

## 2011-11-14 NOTE — ED Notes (Signed)
Pt reporting weakness, feeling "washed out", denying any cp or sob at this time. Reporting hx of sob, which caused her to seek care. Pt appearing pale/yellow.

## 2011-11-14 NOTE — ED Notes (Signed)
The pt came from her doctors office she was seen yesterday and today with low hgb.  She has been having sob and she has a yellowish tint to her skin

## 2011-11-14 NOTE — ED Notes (Signed)
The pt2 hgb idropped from 8.0 yesterday to 7.0 today

## 2011-11-14 NOTE — H&P (Signed)
[name] is an 78 y.o. female.   PCP - Dr.Hal Stoneking. GI - Dr.Gessner. Chief Complaint: Shortness of breath. HPI: 78 year-old female with known history of CAD status post stenting, bronchial asthma, spinal stenosis and colonic polyps requiring multiple colonoscopies last one was in 2009 the next one was scheduled for July this year was experiencing shortness of breath on exertion with palpitation over the last 4 days. Patient had gone to her PCPs office yesterday had blood drawn and was found to have low hemoglobin of 7.9 and was referred to the ER. Reviewing her chart her hemoglobin in February 2013 was 10. Patient did not have any black stools or blood in the stools. She states she had a normal bowel movement today. Stool for occult blood is negative in the ER. Patient had used one Aleve tablet 3 days ago for low back pain. She has never used this otherwise. Patient denies any abdominal pain nausea vomiting chest pain dizziness or loss of consciousness. Patient has been admitted for symptomatic anemia.  Past Medical History  Diagnosis Date  . Arthritis   . Asthma   . CAD (coronary artery disease)   . Adenomatous colon polyp   . Diverticulosis   . HLD (hyperlipidemia)   . Obesity   . Internal hemorrhoids   . Osteoporosis     Past Surgical History  Procedure Date  . Appendectomy   . Cholecystectomy   . Coronary angioplasty with stent placement   . Colonoscopy w/ biopsies and polypectomy 06/15/2008    adenomatous polyps, diverticulosis, internal hemorrhoids  . Total hip arthroplasty 09/08/2007    left, Dr. Lequita Halt    Family History  Problem Relation Age of Onset  . Pancreatic cancer Father   . Pancreatic cancer Brother   . Heart disease Mother   . Heart disease Sister   . Colon cancer Brother     ? Colostomy   Social History:  reports that she has quit smoking. Her smoking use included Cigarettes. She quit after 30 years of use. She has never used smokeless tobacco. She  reports that she drinks alcohol. She reports that she does not use illicit drugs.  Allergies:  Allergies  Allergen Reactions  . Atorvastatin     REACTION: shortness of breath, cramping in back  . Clopidogrel Bisulfate     REACTION: shortness of breath, cramping in back  . Codeine     REACTION: vomiting  . Sulfonamide Derivatives     REACTION: intestinal upset     (Not in a hospital admission)  Results for orders placed during the hospital encounter of 11/14/11 (from the past 48 hour(s))  CBC     Status: Abnormal   Collection Time   11/14/11  4:22 PM      Component Value Range Comment   WBC 8.9  4.0 - 10.5 (K/uL)    RBC 3.37 (*) 3.87 - 5.11 (MIL/uL)    Hemoglobin 7.9 (*) 12.0 - 15.0 (g/dL)    HCT 96.0 (*) 45.4 - 46.0 (%)    MCV 78.0  78.0 - 100.0 (fL)    MCH 23.4 (*) 26.0 - 34.0 (pg)    MCHC 30.0  30.0 - 36.0 (g/dL)    RDW 09.8  11.9 - 14.7 (%)    Platelets 386  150 - 400 (K/uL)   DIFFERENTIAL     Status: Normal   Collection Time   11/14/11  4:22 PM      Component Value Range Comment   Neutrophils  Relative 71  43 - 77 (%)    Neutro Abs 6.3  1.7 - 7.7 (K/uL)    Lymphocytes Relative 18  12 - 46 (%)    Lymphs Abs 1.6  0.7 - 4.0 (K/uL)    Monocytes Relative 8  3 - 12 (%)    Monocytes Absolute 0.7  0.1 - 1.0 (K/uL)    Eosinophils Relative 3  0 - 5 (%)    Eosinophils Absolute 0.3  0.0 - 0.7 (K/uL)    Basophils Relative 1  0 - 1 (%)    Basophils Absolute 0.0  0.0 - 0.1 (K/uL)   COMPREHENSIVE METABOLIC PANEL     Status: Abnormal   Collection Time   11/14/11  4:22 PM      Component Value Range Comment   Sodium 137  135 - 145 (mEq/L)    Potassium 3.4 (*) 3.5 - 5.1 (mEq/L)    Chloride 99  96 - 112 (mEq/L)    CO2 26  19 - 32 (mEq/L)    Glucose, Bld 194 (*) 70 - 99 (mg/dL)    BUN 22  6 - 23 (mg/dL)    Creatinine, Ser 4.09  0.50 - 1.10 (mg/dL)    Calcium 81.1  8.4 - 10.5 (mg/dL)    Total Protein 7.2  6.0 - 8.3 (g/dL)    Albumin 3.9  3.5 - 5.2 (g/dL)    AST 21  0 - 37 (U/L)      ALT 15  0 - 35 (U/L)    Alkaline Phosphatase 49  39 - 117 (U/L)    Total Bilirubin 0.7  0.3 - 1.2 (mg/dL)    GFR calc non Af Amer 48 (*) >90 (mL/min)    GFR calc Af Amer 55 (*) >90 (mL/min)   PROTIME-INR     Status: Normal   Collection Time   11/14/11  4:22 PM      Component Value Range Comment   Prothrombin Time 13.8  11.6 - 15.2 (seconds)    INR 1.04  0.00 - 1.49    APTT     Status: Normal   Collection Time   11/14/11  4:22 PM      Component Value Range Comment   aPTT 30  24 - 37 (seconds)   TYPE AND SCREEN     Status: Normal (Preliminary result)   Collection Time   11/14/11  4:29 PM      Component Value Range Comment   ABO/RH(D) O NEG      Antibody Screen POS      Sample Expiration 11/17/2011      Antibody Identification ANTI-K      DAT, IgG NEG      PT AG Type NEGATIVE FOR KELL ANTIGEN      Unit Number [id number]      Blood Component Type RED CELLS,LR      Unit division 00      Status of Unit ALLOCATED      Transfusion Status OK TO TRANSFUSE      Crossmatch Result COMPATIBLE      Donor AG Type NEGATIVE FOR KELL ANTIGEN      Unit Number [id number]      Blood Component Type RED CELLS,LR      Unit division 00      Status of Unit ALLOCATED      Transfusion Status OK TO TRANSFUSE      Crossmatch Result COMPATIBLE      Donor AG Type NEGATIVE FOR KELL  ANTIGEN     OCCULT BLOOD, POC DEVICE     Status: Normal   Collection Time   11/14/11  5:30 PM      Component Value Range Comment   Fecal Occult Bld NEGATIVE     RETICULOCYTES     Status: Abnormal   Collection Time   11/14/11  6:23 PM      Component Value Range Comment   Retic Ct Pct 2.8  0.4 - 3.1 (%)    RBC. 3.32 (*) 3.87 - 5.11 (MIL/uL)    Retic Count, Manual 93.0  19.0 - 186.0 (K/uL)   CBC     Status: Abnormal   Collection Time   11/14/11  8:11 PM      Component Value Range Comment   WBC 8.3  4.0 - 10.5 (K/uL)    RBC 2.96 (*) 3.87 - 5.11 (MIL/uL)    Hemoglobin 7.0 (*) 12.0 - 15.0 (g/dL)    HCT 16.1 (*) 09.6 - 46.0  (%)    MCV 77.4 (*) 78.0 - 100.0 (fL)    MCH 23.6 (*) 26.0 - 34.0 (pg)    MCHC 30.6  30.0 - 36.0 (g/dL)    RDW 04.5  40.9 - 81.1 (%)    Platelets 304  150 - 400 (K/uL)   PREPARE RBC (CROSSMATCH)     Status: Normal   Collection Time   11/14/11  8:30 PM      Component Value Range Comment   Order Confirmation ORDER PROCESSED BY BLOOD BANK     PREPARE RBC (CROSSMATCH)     Status: Normal   Collection Time   11/14/11  8:30 PM      Component Value Range Comment   Order Confirmation ORDER PROCESSED BY BLOOD BANK      No results found.  Review of Systems  Constitutional: Negative.   HENT: Negative.   Eyes: Negative.   Respiratory: Positive for shortness of breath.   Cardiovascular: Positive for palpitations.  Gastrointestinal: Negative.   Genitourinary: Negative.   Musculoskeletal: Negative.   Skin: Negative.   Neurological: Negative.   Endo/Heme/Allergies: Negative.   Psychiatric/Behavioral: Negative.     Blood pressure 106/67, pulse 94, temperature 98.8 F (37.1 C), temperature source Oral, resp. rate 19, SpO2 100.00%. Physical Exam  Constitutional: She is oriented to person, place, and time. She appears well-developed and well-nourished. No distress.  HENT:  Head: Normocephalic and atraumatic.  Right Ear: External ear normal.  Left Ear: External ear normal.  Nose: Nose normal.  Mouth/Throat: Oropharynx is clear and moist. No oropharyngeal exudate.  Eyes: Pupils are equal, round, and reactive to light.       Pallor.  Neck: Normal range of motion. Neck supple.  Cardiovascular:       Sinus tachycardia.  Respiratory: Effort normal and breath sounds normal. No respiratory distress. She has no wheezes. She has no rales.  GI: Soft. Bowel sounds are normal. She exhibits no distension. There is no tenderness. There is no rebound.  Musculoskeletal: Normal range of motion. She exhibits no edema and no tenderness.  Neurological: She is alert and oriented to person, place, and time.        Moves all extremities.  Skin: Skin is warm and dry. No rash noted. She is not diaphoretic. No erythema.  Psychiatric: Her behavior is normal.     Assessment/Plan #1. Symptomatic anemia with history of colon polyps - given patient's cardiac history with stents we will go ahead and transfuse 2 units of packed red  blood cells. Keep patient on clear liquid diet and Protonix IV. Patient has a hypochromic microcytic picture. The source is not clear at this time. Since patient is tachycardic and symptomatic we will monitor her in step down. Hold off aspirin. I have consulted gastroenterologist on call Dr. Arlyce Dice who will be seeing patient in consult. #2. CAD status post stenting - presently chest pain-free. Hold aspirin due to GI bleed. May have to hold Toprol if patient becomes hypotensive. #3. Asthma - presently not wheezing. #4. Hyperlipidemia - continue statins. #5. Low back pain - presently asymptomatic.  CODE STATUS - full code.  Eduard Clos. 11/14/2011, 9:06 PM

## 2011-11-14 NOTE — ED Provider Notes (Signed)
History     CSN: [id number]  Arrival date & time 11/14/11  1533   First MD Initiated Contact with Patient 11/14/11 1644      Chief Complaint  Patient presents with  . low hgb     (Consider location/radiation/quality/duration/timing/severity/associated sxs/prior treatment) HPI  78 year old female with history of diverticulosis, and history of internal hemorrhoid presents for further evaluations of low hemoglobin. Patient states for the past month she has been experiencing increased fatigue worsening with activities. States she gets out of breath and a tightness sensation in her throat when she exerts herself. She was seen by her primary care Dr. recently and was found that her hemoglobin was initially at 8.0. A repeat hemoglobin level the following day shows 7.6.  She was recommended to come to ER for further evaluation.  Patient states she has not noticed any abnormal bleeding. She denies headache, chest pain, nausea, vomiting, diarrhea, abdominal pain, back pain, or urinary symptoms. She denies melena, or bleeding per rectum. She denies taking NSAIDs. She also denies taking any blood thinning medication. Patient has had a colonoscopy performed 3 years ago by Dr. Leone Payor.  The result were unremarkable except 1 benign polyp.  She denies having internal hemorrhoids.    Past Medical History  Diagnosis Date  . Arthritis   . Asthma   . CAD (coronary artery disease)   . Adenomatous colon polyp   . Diverticulosis   . HLD (hyperlipidemia)   . Obesity   . Internal hemorrhoids   . Osteoporosis     Past Surgical History  Procedure Date  . Appendectomy   . Cholecystectomy   . Coronary angioplasty with stent placement   . Colonoscopy w/ biopsies and polypectomy 06/15/2008    adenomatous polyps, diverticulosis, internal hemorrhoids  . Total hip arthroplasty 09/08/2007    left, Dr. Lequita Halt    Family History  Problem Relation Age of Onset  . Pancreatic cancer Father   . Pancreatic  cancer Brother   . Heart disease Mother   . Heart disease Sister   . Colon cancer Brother     ? Colostomy    History  Substance Use Topics  . Smoking status: Former Smoker -- 30 years    Types: Cigarettes  . Smokeless tobacco: Never Used  . Alcohol Use: Yes     occ wine    OB History    Grav Para Term Preterm Abortions TAB SAB Ect Mult Living                  Review of Systems  All other systems reviewed and are negative.    Allergies  Atorvastatin; Clopidogrel bisulfate; Codeine; and Sulfonamide derivatives  Home Medications   Current Outpatient Rx  Name Route Sig Dispense Refill  . ACETAMINOPHEN 325 MG PO TABS Oral Take 650 mg by mouth every 6 (six) hours as needed. For pain    . ALBUTEROL SULFATE HFA 108 (90 BASE) MCG/ACT IN AERS Inhalation Inhale 2 puffs into the lungs every 4 (four) hours as needed. For shortness of breath    . ASPIRIN 81 MG PO TABS Oral Take 81 mg by mouth daily.     [name] Kitchen CALCIUM GLUCONATE 500 MG PO TABS Oral Take 1,000 mg by mouth daily.    [name] Kitchen FEXOFENADINE HCL 180 MG PO TABS Oral Take 180 mg by mouth daily.    [name] Kitchen LOVASTATIN ER 20 MG PO TB24 Oral Take 20 mg by mouth at bedtime.    [name] Kitchen METOPROLOL TARTRATE 50  MG PO TABS Oral Take 25 mg by mouth 2 (two) times daily.     [name] Kitchen FISH OIL 1200 MG PO CAPS Oral Take 1 capsule by mouth daily.    . TRIAMTERENE-HCTZ 37.5-25 MG PO CAPS Oral Take 1 capsule by mouth every morning.      BP 159/85  Pulse 117  Temp(Src) 98.1 F (36.7 C) (Oral)  Resp 19  SpO2 97%  Physical Exam  Nursing note and vitals reviewed. Constitutional: She is oriented to person, place, and time. She appears well-developed and well-nourished. No distress.       Awake, alert, nontoxic appearance  HENT:  Head: Atraumatic.       Oral mucosa pale  Eyes: Conjunctivae are normal. Right eye exhibits no discharge. Left eye exhibits no discharge.       Conjunctiva pale  Neck: Neck supple. No JVD present.  Cardiovascular: Normal rate and regular  rhythm.   Pulmonary/Chest: Effort normal. No respiratory distress. She has no wheezes. She exhibits no tenderness.  Abdominal: Soft. There is no tenderness. There is no rebound.  Genitourinary: Rectum normal. Rectal exam shows no external hemorrhoid, no internal hemorrhoid and anal tone normal. Guaiac negative stool.       Chaperone present  Musculoskeletal: Normal range of motion. She exhibits no edema and no tenderness.       ROM appears intact, no obvious focal weakness  Neurological: She is alert and oriented to person, place, and time. She has normal strength. She exhibits normal muscle tone. Coordination normal. GCS eye subscore is 4. GCS verbal subscore is 5. GCS motor subscore is 6.  Reflex Scores:      Patellar reflexes are 2+ on the right side and 2+ on the left side.      Mental status and motor strength appears intact  Skin: No rash noted. There is pallor.  Psychiatric: She has a normal mood and affect.    ED Course  Procedures (including critical care time)   Labs Reviewed  CBC  DIFFERENTIAL  COMPREHENSIVE METABOLIC PANEL  SAMPLE TO BLOOD BANK  PROTIME-INR  APTT  TYPE AND SCREEN   No results found.   No diagnosis found.  Results for orders placed during the hospital encounter of 11/14/11  CBC      Component Value Range   WBC 8.9  4.0 - 10.5 (K/uL)   RBC 3.37 (*) 3.87 - 5.11 (MIL/uL)   Hemoglobin 7.9 (*) 12.0 - 15.0 (g/dL)   HCT 21.3 (*) 08.6 - 46.0 (%)   MCV 78.0  78.0 - 100.0 (fL)   MCH 23.4 (*) 26.0 - 34.0 (pg)   MCHC 30.0  30.0 - 36.0 (g/dL)   RDW 57.8  46.9 - 62.9 (%)   Platelets 386  150 - 400 (K/uL)  DIFFERENTIAL      Component Value Range   Neutrophils Relative 71  43 - 77 (%)   Neutro Abs 6.3  1.7 - 7.7 (K/uL)   Lymphocytes Relative 18  12 - 46 (%)   Lymphs Abs 1.6  0.7 - 4.0 (K/uL)   Monocytes Relative 8  3 - 12 (%)   Monocytes Absolute 0.7  0.1 - 1.0 (K/uL)   Eosinophils Relative 3  0 - 5 (%)   Eosinophils Absolute 0.3  0.0 - 0.7 (K/uL)    Basophils Relative 1  0 - 1 (%)   Basophils Absolute 0.0  0.0 - 0.1 (K/uL)  COMPREHENSIVE METABOLIC PANEL      Component Value Range  Sodium 137  135 - 145 (mEq/L)   Potassium 3.4 (*) 3.5 - 5.1 (mEq/L)   Chloride 99  96 - 112 (mEq/L)   CO2 26  19 - 32 (mEq/L)   Glucose, Bld 194 (*) 70 - 99 (mg/dL)   BUN 22  6 - 23 (mg/dL)   Creatinine, Ser 1.61  0.50 - 1.10 (mg/dL)   Calcium 09.6  8.4 - 10.5 (mg/dL)   Total Protein 7.2  6.0 - 8.3 (g/dL)   Albumin 3.9  3.5 - 5.2 (g/dL)   AST 21  0 - 37 (U/L)   ALT 15  0 - 35 (U/L)   Alkaline Phosphatase 49  39 - 117 (U/L)   Total Bilirubin 0.7  0.3 - 1.2 (mg/dL)   GFR calc non Af Amer 48 (*) >90 (mL/min)   GFR calc Af Amer 55 (*) >90 (mL/min)  PROTIME-INR      Component Value Range   Prothrombin Time 13.8  11.6 - 15.2 (seconds)   INR 1.04  0.00 - 1.49   APTT      Component Value Range   aPTT 30  24 - 37 (seconds)  TYPE AND SCREEN      Component Value Range   ABO/RH(D) O NEG     Antibody Screen POS     Sample Expiration 11/17/2011     Antibody Identification PENDING    OCCULT BLOOD, POC DEVICE      Component Value Range   Fecal Occult Bld NEGATIVE     No results found.  CRITICAL CARE Performed by: Fayrene Helper   Total critical care time:30 min  Critical care time was exclusive of separately billable procedures and treating other patients.  Critical care was necessary to treat or prevent imminent or life-threatening deterioration.  Critical care was time spent personally by me on the following activities: development of treatment plan with patient and/or surrogate as well as nursing, discussions with consultants, evaluation of patient's response to treatment, examination of patient, obtaining history from patient or surrogate, ordering and performing treatments and interventions, ordering and review of laboratory studies, ordering and review of radiographic studies, pulse oximetry and re-evaluation of patient's condition.   MDM    Undifferentiated anemia.  Hemoccult negative.  Anemia panel ordered.  Will consider transfusion after anemia panel done.  Pt agrees with plan.  Consent form filled.     7:55 PM i have discussed pt care with my attending.  [facility] has been consulted and agrees to admit pt to Team 5, tele bed, under the care of dr. Jearld Fenton.  Pt is currently stable, and in no acute distress.     8:34 PM 1 unit given. Pt is still tachycardic.  Dr. Audrie Gallus has seen pt and recommend another unit of blood.  Pt will be admitted to step down.      Fayrene Helper, PA-C 11/14/11 1956  Fayrene Helper, PA-C 11/14/11 1957  Fayrene Helper, PA-C 11/14/11 2035

## 2011-11-14 NOTE — ED Notes (Signed)
Called and gave report to Ciara.

## 2011-11-14 NOTE — ED Notes (Signed)
Pt resting, denying any pain. A x 4. Pt provided warm blanket.

## 2011-11-15 DIAGNOSIS — R112 Nausea with vomiting, unspecified: Secondary | ICD-10-CM

## 2011-11-15 DIAGNOSIS — I10 Essential (primary) hypertension: Secondary | ICD-10-CM

## 2011-11-15 DIAGNOSIS — D500 Iron deficiency anemia secondary to blood loss (chronic): Secondary | ICD-10-CM

## 2011-11-15 DIAGNOSIS — D509 Iron deficiency anemia, unspecified: Secondary | ICD-10-CM

## 2011-11-15 DIAGNOSIS — J45909 Unspecified asthma, uncomplicated: Secondary | ICD-10-CM

## 2011-11-15 DIAGNOSIS — K5730 Diverticulosis of large intestine without perforation or abscess without bleeding: Secondary | ICD-10-CM

## 2011-11-15 DIAGNOSIS — I2510 Atherosclerotic heart disease of native coronary artery without angina pectoris: Secondary | ICD-10-CM

## 2011-11-15 DIAGNOSIS — K6389 Other specified diseases of intestine: Secondary | ICD-10-CM

## 2011-11-15 LAB — TSH: TSH: 1.53 u[IU]/mL (ref 0.350–4.500)

## 2011-11-15 LAB — CBC
Hemoglobin: 10.7 g/dL — ABNORMAL LOW (ref 12.0–15.0)
Platelets: 303 10*3/uL (ref 150–400)
RBC: 4.32 MIL/uL (ref 3.87–5.11)
WBC: 9.3 10*3/uL (ref 4.0–10.5)

## 2011-11-15 LAB — COMPREHENSIVE METABOLIC PANEL
AST: 21 U/L (ref 0–37)
CO2: 23 mEq/L (ref 19–32)
Chloride: 105 mEq/L (ref 96–112)
Glucose, Bld: 133 mg/dL — ABNORMAL HIGH (ref 70–99)
Sodium: 140 mEq/L (ref 135–145)

## 2011-11-15 MED ORDER — PEG 3350-KCL-NA BICARB-NACL 420 G PO SOLR
2000.0000 mL | Freq: Once | ORAL | Status: AC
  Administered 2011-11-16: 2000 mL via ORAL
  Filled 2011-11-15 (×2): qty 4000

## 2011-11-15 MED ORDER — BISACODYL 5 MG PO TBEC
20.0000 mg | DELAYED_RELEASE_TABLET | Freq: Once | ORAL | Status: DC
  Filled 2011-11-15: qty 4

## 2011-11-15 MED ORDER — SODIUM CHLORIDE 0.9 % IV SOLN
INTRAVENOUS | Status: DC
Start: 2011-11-15 — End: 2011-11-16
  Administered 2011-11-15 – 2011-11-16 (×2): via INTRAVENOUS

## 2011-11-15 MED ORDER — METOCLOPRAMIDE HCL 5 MG/ML IJ SOLN
10.0000 mg | Freq: Once | INTRAMUSCULAR | Status: AC
  Administered 2011-11-15: 10 mg via INTRAVENOUS
  Filled 2011-11-15: qty 2

## 2011-11-15 MED ORDER — PEG 3350-KCL-NA BICARB-NACL 420 G PO SOLR
2000.0000 mL | Freq: Once | ORAL | Status: AC
  Administered 2011-11-15: 2000 mL via ORAL
  Filled 2011-11-15: qty 4000

## 2011-11-15 MED ORDER — METOCLOPRAMIDE HCL 5 MG/ML IJ SOLN
10.0000 mg | Freq: Once | INTRAMUSCULAR | Status: AC
  Administered 2011-11-16: 10 mg via INTRAVENOUS
  Filled 2011-11-15 (×2): qty 2

## 2011-11-15 NOTE — Progress Notes (Signed)
Inpatient Diabetes Program  Pt told in the past that she was borderline diabetic.  Results for LORELEI, HEIKKILA (MRN [id number]) as of 11/15/2011 13:12  Ref. Range 11/14/2011 16:22 11/15/2011 08:20  Glucose Latest Range: 70-99 mg/dL 409 (H) 811 (H)   Recommendations:  Add Novolog sensitive tidwc.  Will follow.

## 2011-11-15 NOTE — Progress Notes (Signed)
[name] [facility]astro Consult: 9:55 AM 11/15/2011   Referring Provider: Toniann Fail  Primary Care Physician:  Ginette Otto, MD, MD Primary Gastroenterologist:  Dr. Leone Payor  Reason for Consultation:  Anemia. FOB negative.   HPI: [name] is a 78 y.o. female.  Hx recurrent colon adenomas since 1991 through latest 2009 colonoscopy.  Due for repeat colonoscopy in July 2013.  Went to primary MD yest for worsening DOE.  Noticed onset of trouble breathing with exertion 6 or so weeks ago, thought it was pollen/allergy related.  Began to notice exertional pain/tightness at anterior neck area, thought it was her cervical arthritis.  Tachycardia with activity.  All of these sxs would subside at rest.  In last 4 days the SOB was profound.  Labs drawn on visit to primary MD on Tuesday 5/14.  Repeat labs Wednesday 5/15 and was called and told to go to hospital yesterday due to Hgb of 7.9.  Hgb in 2011 was 13.1, 10.5 in 08/2011, 7.0 yesterday, 10.7 post 2 units PRBCs overnight.  Ferritin is 8.0. Stool FOB test is negative for blood.  Using Aleve once last week and it caused some GI upset but that always happens with Aleve, no chronic use. Chronic 81 mg ASA.  No Plavix etc, no PPI. Started on Protonix 40 mg IV BID at admission.   No black or tarry stools, no BPR.  No anorexia, early satiety, dyspepsia, but reports increased erructition for several weeks.  Does not ever feel need to take antacids. No nose bleeds or hematuria. Previous blood transfusions at time of MI 10 or so years ago, and after hip replacement. No large bruises, just the usual purpura on arms.  No skin lesions.  No new dizzyness, but chronic positional vertigo for at least 10 months that lasts 5 seconds when she stands up. No blurry vision, excessive thirst or urination.  Has been informed she was borderline diabetic in past, never needed meds for this.   Eats red meat   Past Medical History   Diagnosis Date  . Arthritis   . Asthma   . CAD (coronary artery disease)   . Adenomatous colon polyp   . Diverticulosis   . HLD (hyperlipidemia)   . Obesity   . Internal hemorrhoids   . Osteoporosis     Past Surgical History  Procedure Date  . Appendectomy   . Cholecystectomy   . Coronary angioplasty with stent placement   . Colonoscopy w/ biopsies and polypectomy 06/15/2008    adenomatous polyps, diverticulosis, internal hemorrhoids  . Total hip arthroplasty 09/08/2007    left, Dr. Lequita Halt    Prior to Admission medications   Medication Sig Start Date End Date Taking? Authorizing Provider  acetaminophen (TYLENOL) 325 MG tablet Take 650 mg by mouth every 6 (six) hours as needed. For pain   Yes Historical Provider, MD  albuterol (PROVENTIL HFA;VENTOLIN HFA) 108 (90 BASE) MCG/ACT inhaler Inhale 2 puffs into the lungs every 4 (four) hours as needed. For shortness of breath   Yes Historical Provider, MD  aspirin 81 MG tablet Take 81 mg by mouth daily.    Yes Historical Provider, MD  calcium gluconate 500 MG tablet Take 1,000 mg by mouth daily.   Yes Historical Provider, MD  fexofenadine (ALLEGRA) 180 MG tablet Take 180 mg by mouth daily.   Yes Historical Provider, MD  lovastatin (ALTOPREV) 20 MG 24 hr tablet Take 20 mg by mouth at bedtime.   Yes Historical Provider, MD  metoprolol (LOPRESSOR) 50 MG  tablet Take 25 mg by mouth 2 (two) times daily.    Yes Historical Provider, MD  Omega-3 Fatty Acids (FISH OIL) 1200 MG CAPS Take 1 capsule by mouth daily.   Yes Historical Provider, MD  triamterene-hydrochlorothiazide (DYAZIDE) 37.5-25 MG per capsule Take 1 capsule by mouth every morning.   Yes Historical Provider, MD    Scheduled Meds:    . lovastatin  20 mg Oral QHS  . metoprolol  25 mg Oral BID  . pantoprazole (PROTONIX) IV  40 mg Intravenous Q12H  . sodium chloride  1,000 mL Intravenous Once  . sodium chloride  3 mL Intravenous Q12H  . DISCONTD: sodium chloride   Intravenous  STAT  . DISCONTD: lovastatin  20 mg Oral QHS  . DISCONTD: lovastatin  20 mg Oral QHS   Infusions:    . sodium chloride 100 mL/hr at 11/14/11 2345   PRN Meds: acetaminophen, acetaminophen, albuterol, ondansetron (ZOFRAN) IV, ondansetron   Allergies as of 11/14/2011 - Review Complete 11/14/2011  Allergen Reaction Noted  . Atorvastatin    . Clopidogrel bisulfate    . Codeine  06/01/2008  . Sulfonamide derivatives  06/01/2008    Family History  Problem Relation Age of Onset  . Pancreatic cancer Father   . Pancreatic cancer Brother   . Heart disease Mother   . Heart disease Sister   . Colon cancer Brother     ? Colostomy    History   Social History  . Marital Status: Married    Spouse Name: N/A    Number of Children: 2  . Years of Education: N/A   Occupational History  . retired    Social History Main Topics  . Smoking status: Former Smoker -- 30 years    Types: Cigarettes  . Smokeless tobacco: Never Used  . Alcohol Use: Yes     occ wine  . Drug Use: No  . Sexually Active: Not on file   Other Topics Concern  . Not on file   Social History Narrative   Married, Agricultural consultant at [employer]    REVIEW OF SYSTEMS: 14 systems reviewed.  See th HPI  PHYSICAL EXAM: Vital signs in last 24 hours: Temp:  [98.1 F (36.7 C)-98.9 F (37.2 C)] 98.1 F (36.7 C) (05/16 0759) Pulse Rate:  [78-117] 81  (05/16 0759) Resp:  [13-20] 20  (05/16 0759) BP: (93-159)/(43-85) 134/60 mmHg (05/16 0741) SpO2:  [96 %-100 %] 99 % (05/16 0741) Weight:  [172 lb 2.9 oz (78.1 kg)] 172 lb 2.9 oz (78.1 kg) (05/15 2355)  General: looks well, elderly wf Head:  No assymmetry or signs of trauma  Eyes:  Slight pallor of conjunctiva Ears:  Not HOH  Nose:  No congestion or discharge Mouth:  Good dentition.  Pink, clear MM Neck:  No jvd or bruit Lungs:  Clear.  No resting dyspnea Heart: RRR.  No MRG Abdomen:  Soft, NT, ND.  No HSM, masses, bruits.   Rectal: not done   Musc/Skeltl: no  joint swelling or marked deformities Extremities:  No pedal edema.  2 to 3 plus pedal pulses B.  Feet warm with brisk cap refill.  Neurologic:  No tremor.  Fully oriented.  Moves all 4s easily.   Skin:  No telangectasia. No sores or rash.  Mild purpura on forearms Tattoos:  none Nodes:  No cervical or inguinal adenopathy   Psych:  Pleasant,  Good historian.  Not anxious or depressed.   Intake/Output from previous day: 05/15 0701 -  05/16 0700 In: 1755 [P.O.:120; I.V.:900; Blood:725; IV Piggyback:10] Out: 1200 [Urine:1200] Intake/Output this shift: Total I/O In: 463 [P.O.:360; I.V.:103] Out: -   LAB RESULTS:   Ref. Range 12/06/2009 14:50 08/27/2011 01:19 11/14/2011 16:22 11/14/2011 18:23 11/14/2011 20:11 11/15/2011 08:20  WBC Latest Range: 4.0-10.5 K/uL 11.4 (H) 9.6 8.9  8.3 9.3  RBC Latest Range: 3.87-5.11 MIL/uL 4.09 3.74 (L) 3.37 (L)  2.96 (L) 4.32  Hemoglobin Latest Range: 12.0-15.0 g/dL 50.5 39.7 (L) 7.9 (L)  7.0 (L) 10.7 (L)  HCT Latest Range: 36.0-46.0 % 37.5 33.0 (L) 26.3 (L)  22.9 (L) 34.2 (L)  MCV Latest Range: 78.0-100.0 fL 91.7 88.2 78.0  77.4 (L) 79.2  MCH Latest Range: 26.0-34.0 pg  28.1 23.4 (L)  23.6 (L) 24.8 (L)  MCHC Latest Range: 30.0-36.0 g/dL 67.3 41.9 37.9  02.4 09.7  RDW Latest Range: 11.5-15.5 % 12.5 13.2 14.2  14.2 14.6  Platelets Latest Range: 150-400 K/uL 314.0 284 386  304 303     Ref. Range 11/14/2011 18:23  Iron Latest Range: 42-135 ug/dL 49  UIBC Latest Range: 125-400 ug/dL 353 (H)  TIBC Latest Range: 250-470 ug/dL 299 (H)  Saturation Ratios Latest Range: 20-55 % 8 (L)  Ferritin Latest Range: 10-291 ng/mL 8 (L)  Folate No range found >20.0     BMET Lab Results  Component Value Date   NA 140 11/15/2011   NA 137 11/14/2011   NA 137 08/27/2011   K 3.8 11/15/2011   K 3.4* 11/14/2011   K 3.7 08/27/2011   CL 105 11/15/2011   CL 99 11/14/2011   CL 101 08/27/2011   CO2 23 11/15/2011   CO2 26 11/14/2011   CO2 26 08/27/2011   GLUCOSE 133* 11/15/2011   GLUCOSE  194* 11/14/2011   GLUCOSE 165* 08/27/2011   BUN 15 11/15/2011   BUN 22 11/14/2011   BUN 21 08/27/2011   CREATININE 0.85 11/15/2011   CREATININE 1.06 11/14/2011   CREATININE 0.99 08/27/2011   CALCIUM 9.3 11/15/2011   CALCIUM 10.2 11/14/2011   CALCIUM 9.7 08/27/2011   LFT  Basename 11/15/11 0820 11/14/11 1622  PROT 6.6 7.2  ALBUMIN 3.6 3.9  AST 21 21  ALT 14 15  ALKPHOS 47 49  BILITOT 2.3* 0.7  BILIDIR -- --  IBILI -- --   PT/INR Lab Results  Component Value Date   INR 1.04 11/14/2011   RADIOLOGY STUDIES: No results found.  ENDOSCOPIC STUDIES: Colonoscopy   06/2008  Dr Leone Payor  For hx polyps, and fm hx of colon CA 1) Three polyps  2) Severe diverticulosis  3) Internal hemorrhoids in the rectum  4) Otherwise normal examination  5) Prior adenomas  6) Family history of Colon Cancer (brother)  7) Excellent prep  RECOMMENDATIONS:  No ASA/NSAIDS x 2 weeks  REPEAT EXAM: In 3 - 5 year(s) for Colonoscopy. If medically fit. Pathology: ASCENDING AND SPLENIC FLEXURE COLON, POLYP(S): ADENOMATOUS  POLYP(S). NO HIGH GRADE DYSPLASIA OR INVASIVE MALIGNANCY  IDENTIFIED. (THREE)   EGD 2002  [place] Non hemorrhagic gastritis.  Colonoscopies in 1991, 1992, 1993, 1195, 1997, 2000, 2002, 2006( adenomatous polyp) EGD  2002    Unable to pull up actual reports.    IMPRESSION: *  Anemia, borderline normal MCV and Iron. Low ferritin.  Heme negative stool thus far S/P 2 units PRBCs with good response.  *  Hx of adenomatous colon polyps, recurrent since initial occurrence in 1991 *  Hyperglycemia.  *  CAD.  S/p prior cardiac stenting. Exertional neck  pain may be anginal equivalent.  Cardiac enzymes not elevated.   PLAN: *  Check TSH,  *  D/C the IV protonix.  *  CBC in AM *  Let her eat *  let her walk and see if sxs have improved *  transfer out of unit?  Defer to hospitalist.      LOS: 1 day   Jennye Moccasin  11/15/2011, 9:55 AM Pager: [phone]    [place] [facility] Attending  I have also  seen and assessed the patient and agree with the above note. She is actually iron-deficient so colonoscopy +/- EGD are appropriate. Her neck pain is fairly chronic and seems to be from cervical spine disease. Dyspnea on exertion from anemia.  We talked about timing - now vs. Delaying and outpatient work-up. We have decided to pursue colonoscopy +/- EGD now (tomorrow). She can still go home tomorrow, after those procedures.  I appreciate the opportunity to care for this patient.  Iva Boop, MD, [facility] [phone] (pager) 11/15/2011 5:10 PM

## 2011-11-15 NOTE — ED Provider Notes (Signed)
Medical screening examination/treatment/procedure(s) were performed by non-physician practitioner and as supervising physician I was immediately available for consultation/collaboration.  Tranae Laramie R. Noemi Ishmael, MD 11/15/11 0018 

## 2011-11-15 NOTE — Progress Notes (Signed)
TRANSFERRED TO 3702 BY WHEELCHAIR, STABLE, BELONGINGS WITH PT. REPORT GIVEN TO RN.

## 2011-11-15 NOTE — Progress Notes (Signed)
Patient ID: [name]  female  JXB:[id number]    DOB: 1928-06-04    DOA: 11/14/2011  PCP: Ginette Otto, MD, MD  Subjective: No subjective complaints by the patient  Objective: Weight change:   Intake/Output Summary (Last 24 hours) at 11/15/11 1522 Last data filed at 11/15/11 1300  Gross per 24 hour  Intake   2748 ml  Output   1400 ml  Net   1348 ml   Blood pressure 132/54, pulse 74, temperature 98.3 F (36.8 C), temperature source Oral, resp. rate 16, height 5' (1.524 m), weight 78.1 kg (172 lb 2.9 oz), SpO2 97.00%.  Physical Exam: General: Alert and awake, oriented x3, not in any acute distress. HEENT: anicteric sclera, pupils reactive to light and accommodation, EOMI CVS: S1-S2 clear, no murmur rubs or gallops Chest: clear to auscultation bilaterally, no wheezing, rales or rhonchi Abdomen: soft nontender, nondistended, normal bowel sounds, no organomegaly Extremities: no cyanosis, clubbing or edema noted bilaterally Neuro: Cranial nerves II-XII intact, no focal neurological deficits  Lab Results: Basic Metabolic Panel:  Lab 11/15/11 1308 11/14/11 1622  NA 140 137  K 3.8 3.4*  CL 105 99  CO2 23 26  GLUCOSE 133* 194*  BUN 15 22  CREATININE 0.85 1.06  CALCIUM 9.3 10.2  MG -- --  PHOS -- --   Liver Function Tests:  Lab 11/15/11 0820 11/14/11 1622  AST 21 21  ALT 14 15  ALKPHOS 47 49  BILITOT 2.3* 0.7  PROT 6.6 7.2  ALBUMIN 3.6 3.9   CBC:  Lab 11/15/11 0820 11/14/11 2011 11/14/11 1622  WBC 9.3 8.3 --  NEUTROABS -- -- 6.3  HGB 10.7* 7.0* --  HCT 34.2* 22.9* --  MCV 79.2 77.4* --  PLT 303 304 --   Cardiac Enzymes:  Lab 11/14/11 2203  CKTOTAL 82  CKMB 2.5  CKMBINDEX --  TROPONINI <0.30     Micro Results: Recent Results (from the past 240 hour(s))  MRSA PCR SCREENING     Status: Normal   Collection Time   11/14/11 11:38 PM      Component Value Range Status Comment   MRSA by PCR NEGATIVE  NEGATIVE  Final     Studies/Results: No  results found.  Medications: Scheduled Meds:   . lovastatin  20 mg Oral QHS  . metoprolol  25 mg Oral BID  . sodium chloride  1,000 mL Intravenous Once  . sodium chloride  3 mL Intravenous Q12H  . DISCONTD: sodium chloride   Intravenous STAT  . DISCONTD: lovastatin  20 mg Oral QHS  . DISCONTD: lovastatin  20 mg Oral QHS  . DISCONTD: pantoprazole (PROTONIX) IV  40 mg Intravenous Q12H   Continuous Infusions:   . DISCONTD: sodium chloride Stopped (11/15/11 1037)     Assessment/Plan: Principal Problem:  *Anemia: Symptomatic and hemoglobin 7.0 at the time of admission - Status post 2 units packed RBC, consulted gastroenterology, commended monitoring H&H and no plans of any endoscopy currently - Diet advance, ambulate, IV protonix dc'ed, check TSH  Active Problems:  HYPERLIPIDEMIA   HYPERTENSION: Stable   CORONARY ARTERY DISEASE: Stable, asymptomatic, transfuse 2 units of packed RBCs   PERSONAL HX COLONIC POLYPS: See #1  DVT Prophylaxis: SCDs  Code Status: Full code  Disposition: Hopefully DC home in a.m. if stable H&H   LOS: 1 day   Ly Bacchi M.D. [facility] 11/15/2011, 3:22 PM Pager: [phone]

## 2011-11-15 NOTE — Progress Notes (Signed)
AMBULATED ALONG THE HALLWAY  , COMPLAINED OF FEELING TIGHTNESS AROUND HER NECK, CLAIMED THAT IS LESS THAN BEFORE SHE CAME IN. TIGHTNESS ON NECK IS FELT WHILE AMBULATING. GI PA MADE AWARE. NO ORDER.

## 2011-11-16 ENCOUNTER — Encounter (HOSPITAL_COMMUNITY)
Admission: EM | Disposition: A | Discharge status: Home / Self Care | Payer: Self-pay | Source: Ambulatory Visit | Attending: Internal Medicine

## 2011-11-16 ENCOUNTER — Encounter (HOSPITAL_COMMUNITY): Payer: Self-pay

## 2011-11-16 DIAGNOSIS — I2510 Atherosclerotic heart disease of native coronary artery without angina pectoris: Secondary | ICD-10-CM

## 2011-11-16 DIAGNOSIS — R112 Nausea with vomiting, unspecified: Secondary | ICD-10-CM

## 2011-11-16 DIAGNOSIS — C182 Malignant neoplasm of ascending colon: Secondary | ICD-10-CM | POA: Diagnosis present

## 2011-11-16 DIAGNOSIS — D490 Neoplasm of unspecified behavior of digestive system: Secondary | ICD-10-CM

## 2011-11-16 HISTORY — PX: COLONOSCOPY: SHX5424

## 2011-11-16 LAB — CBC
HCT: 34 % — ABNORMAL LOW (ref 36.0–46.0)
Hemoglobin: 10.7 g/dL — ABNORMAL LOW (ref 12.0–15.0)
WBC: 8.4 10*3/uL (ref 4.0–10.5)

## 2011-11-16 LAB — TYPE AND SCREEN
ABO/RH(D): O NEG
Antibody Screen: POSITIVE
DAT, IgG: NEGATIVE
Donor AG Type: NEGATIVE
Unit division: 0

## 2011-11-16 SURGERY — COLONOSCOPY
Anesthesia: Moderate Sedation

## 2011-11-16 MED ORDER — MIDAZOLAM HCL 10 MG/2ML IJ SOLN
INTRAMUSCULAR | Status: AC
  Filled 2011-11-16: qty 2

## 2011-11-16 MED ORDER — FENTANYL NICU IV SYRINGE 50 MCG/ML
INJECTION | INTRAMUSCULAR | Status: DC | PRN
  Administered 2011-11-16 (×3): 25 ug via INTRAVENOUS

## 2011-11-16 MED ORDER — FERROUS SULFATE 325 (65 FE) MG PO TBEC: 325.0000 mg | DELAYED_RELEASE_TABLET | Freq: Two times a day (BID) | ORAL | Status: DC

## 2011-11-16 MED ORDER — FENTANYL CITRATE 0.05 MG/ML IJ SOLN
INTRAMUSCULAR | Status: AC
  Filled 2011-11-16: qty 2

## 2011-11-16 MED ORDER — MIDAZOLAM HCL 10 MG/2ML IJ SOLN
INTRAMUSCULAR | Status: DC | PRN
  Administered 2011-11-16: 2 mg via INTRAVENOUS
  Administered 2011-11-16: 1 mg via INTRAVENOUS
  Administered 2011-11-16 (×2): 2 mg via INTRAVENOUS

## 2011-11-16 NOTE — Discharge Summary (Addendum)
DISCHARGE SUMMARY  TACIA HINDLEY  MR#: [id number]  DOB:Sep 19, 1927  Date of Admission: 11/14/2011 Date of Discharge: 11/16/2011  Attending Physician:Brenan Modesto T  Patient's UXL:[id number],ZDG Maisie Fus, MD, MD  Consults: Iva Boop, MD - GI  Disposition: D/C home with husband  Follow-up Appts: keep your scheduled f/u with Dr. Pete Glatter for Monday - Nov 19, 2011  Tests Needing Follow-up: CBC is indicated to re-evaluate Hgb - path results from colo bx will be called to pt by Dr. Leone Payor  Discharge Diagnoses: Present on Admission:  .Iron deficiency anemia secondary to blood loss (chronic) .HYPERLIPIDEMIA .CORONARY ARTERY DISEASE .HYPERTENSION .Colonic mass - subsequent path f/u revealing invasive adenocarcinoma   Initial presentation: 78 year-old female with known history of CAD status post stenting, bronchial asthma, spinal stenosis and colonic polyps requiring multiple colonoscopies (last one was in 2009 the next one was scheduled for July this year) who was experiencing shortness of breath on exertion with palpitation over the 4 days prior to her admission. Patient had gone to her PCPs office, had blood drawn, was found to have low hemoglobin of 7.9, and was referred to the ER. Reviewing her chart her hemoglobin in February 2013 was 10. Patient did not have any black stools or blood in the stools. She stated she had a normal bowel movement the day of her admit. Stool for occult blood were negative in the ER. Patient denied abdominal pain nausea vomiting chest pain dizziness or loss of consciousness. Patient was admitted for symptomatic anemia.  Hospital Course:  Slow chronic blood loss anemia Symptomatic and hemoglobin 7.0 at the time of admission - GI consulted Leone Payor) - Status post 2 units packed RBC - Hgb stable at 10.7 - pt informed to resume OTC Fe tx - to have f/u CBC next week via Primary MD (scheduled for visit on Monday, May 20)  Mass in the ascending colon GI  was consulted to evaluate Fe deficiency anemia - colonoscopy was performed on 11/16/2011 - A mass was found in the ascending colon - 1/3  circumference, firm and friable with superficial ulceration - About  3-4 cm - Several cm distal to cecum - suspect this is a carcinoma causing her blood loss anemia - biopsies were taken, and Dr. Leone Payor informed the pt and her husband of his suspected diagnosis - Dr. Leone Payor will call the patient with the final path results of the bx  HYPERLIPIDEMIA  Continue medical tx - LFTs stable   HYPERTENSION BP stable - resume usual med tx at time of d/c  CORONARY ARTERY DISEASE Stable, asymptomatic - pt instructed to hold ASA until further notice due to high risk of ongoing, recurrent bleeding from colon mass  PERSONAL HX COLONIC POLYPS  Medication List  As of 11/16/2011  3:56 PM   STOP taking these medications         aspirin 81 MG tablet         TAKE these medications         acetaminophen 325 MG tablet   Commonly known as: TYLENOL   Take 650 mg by mouth every 6 (six) hours as needed. For pain      albuterol 108 (90 BASE) MCG/ACT inhaler   Commonly known as: PROVENTIL HFA;VENTOLIN HFA   Inhale 2 puffs into the lungs every 4 (four) hours as needed. For shortness of breath      calcium gluconate 500 MG tablet   Take 1,000 mg by mouth daily.      ferrous sulfate 325 (65  FE) MG EC tablet   Take 1 tablet (325 mg total) by mouth 2 (two) times daily with a meal.      fexofenadine 180 MG tablet   Commonly known as: ALLEGRA   Take 180 mg by mouth daily.      Fish Oil 1200 MG Caps   Take 1 capsule by mouth daily.      lovastatin 20 MG 24 hr tablet   Commonly known as: ALTOPREV   Take 20 mg by mouth at bedtime.      metoprolol 50 MG tablet   Commonly known as: LOPRESSOR   Take 25 mg by mouth 2 (two) times daily.      triamterene-hydrochlorothiazide 37.5-25 MG per capsule   Commonly known as: DYAZIDE   Take 1 capsule by mouth every morning.             Day of Discharge BP 161/82  Pulse 91  Temp(Src) 98.1 F (36.7 C) (Oral)  Resp 18  Ht 5' (1.524 m)  Wt 78.1 kg (172 lb 2.9 oz)  BMI 33.63 kg/m2  SpO2 96%  Physical Exam: General: No acute respiratory distress Lungs: Clear to auscultation bilaterally without wheezes or crackles Cardiovascular: Regular rate and rhythm without murmur gallop or rub normal S1 and S2 Abdomen: Nontender, nondistended, soft, bowel sounds positive, no rebound, no ascites, no appreciable mass Extremities: No significant cyanosis, clubbing, or edema bilateral lower extremities  Results for orders placed during the hospital encounter of 11/14/11 (from the past 24 hour(s))  CBC     Status: Abnormal   Collection Time   11/16/11  5:10 AM      Component Value Range   WBC 8.4  4.0 - 10.5 (K/uL)   RBC 4.23  3.87 - 5.11 (MIL/uL)   Hemoglobin 10.7 (*) 12.0 - 15.0 (g/dL)   HCT 16.1 (*) 09.6 - 46.0 (%)   MCV 80.4  78.0 - 100.0 (fL)   MCH 25.3 (*) 26.0 - 34.0 (pg)   MCHC 31.5  30.0 - 36.0 (g/dL)   RDW 04.5  40.9 - 81.1 (%)   Platelets 254  150 - 400 (K/uL)    Follow-up Information    Follow up with Ginette Otto, MD. (keep your scheduled appointment on 11/19/2011)    Contact information:   [address] [place] [address] [place] [address] [phone]         Time spent in discharge (includes decision making & examination of pt): >30 minutes  11/16/2011, 3:56 PM   Lonia Blood, MD [facility] Office  [phone] Pager [phone]  On-Call/Text Page:      [email or web address]      password [facility]

## 2011-11-16 NOTE — Discharge Instructions (Signed)
Gastrointestinal Bleeding  You are loosing blood into your bowel. This may be found by a stool blood test called Hemoccult. It can also be seen as a very dark bloody or tarry stool. It may be seen as vomiting bright red or coffee ground appearing blood. Intestinal bleeding can be caused by many different problems. These may include:  Gastritis, peptic ulcer, or bleeding due to anti-inflammatory medicine.   Diverticulosis.   Colon tumors.   Severe diarrhea.   Colitis.   Hemorrhoids.   Anal fissures.  Treatment depends on finding the cause of the bleeding. Aspirin and other anti-inflammatory medicines can cause intestinal bleeding in some people. Avoid these medicines. Diagnosis may require special x-rays or endoscopy to find the source of the bleeding. In general, you should avoid strenuous activities and eat a high fiber diet with extra fluids to keep your stool soft. Medications to block stomach acid, control diarrhea or reduce pain may be needed. Avoid alcohol and tobacco.  SEEK IMMEDIATE MEDICAL CARE IF:   You develop severe weakness.   You have dizziness, fainting, or shortness of breath.   You are vomiting blood.   You are passing large amounts of bloody or tarry stool.  Document Released: 07/26/2004 Document Revised: 02/28/2011 Document Reviewed: 06/18/2005 [facility] Patient Information 2012 [place], [place].  Anemia, Frequently Asked Questions WHAT ARE THE SYMPTOMS OF ANEMIA?  Headache.   Difficulty thinking.   Fatigue.   Shortness of breath.   Weakness.   Rapid heartbeat.  AT WHAT POINT ARE PEOPLE CONSIDERED ANEMIC?  This varies with gender and age.   Both hemoglobin (Hgb) and hematocrit values are used to define anemia. These lab values are obtained from a complete blood count (CBC) test. This is performed at a caregiver's office.   The normal range of hemoglobin values for adult men is 14.0 g/dL to 16.1 g/dL.  For nonpregnant women, values are 12.3 g/dL to 09.6 g/dL.   The World Health Organization defines anemia as less than 12 g/dL for nonpregnant women and less than 13 g/dL for men.   For adult males, the average normal hematocrit is 46%, and the range is 40% to 52%.   For adult females, the average normal hematocrit is 41%, and the range is 35% to 47%.   Values that fall below the lower limits can be a sign of anemia and should have further checking (evaluation).  GROUPS OF PEOPLE WHO ARE AT RISK FOR DEVELOPING ANEMIA INCLUDE:   Infants who are breastfed or taking a formula that is not fortified with iron.   Children going through a rapid growth spurt. The iron available can not keep up with the needs for a red cell mass which must grow with the child.   Women in childbearing years. They need iron because of blood loss during menstruation.   Pregnant women. The growing fetus creates a high demand for iron.   People with ongoing gastrointestinal blood loss are at risk of developing iron deficiency.   Individuals with leukemia or cancer who must receive chemotherapy or radiation to treat their disease. The drugs or radiation used to treat these diseases often decreases the bone marrow's ability to make cells of all classes. This includes red blood cells, white blood cells, and platelets.   Individuals with chronic inflammatory conditions such as rheumatoid arthritis or chronic infections.   The elderly.  ARE SOME TYPES OF ANEMIA INHERITED?   Yes, some types of anemia are due to inherited or genetic defects.  Sickle cell anemia. This occurs most often in people of African, African American, and Mediterranean descent.   Thalassemia (or Cooley's anemia). This type is found in people of Mediterranean and Southeast Asian descent. These types of anemia are common.   [place]. This is rare.  CAN CERTAIN MEDICATIONS CAUSE A PERSON TO BECOME ANEMIC?  Yes. For example, drugs to fight cancer  (chemotherapeutic agents) often cause anemia. These drugs can slow the bone marrow's ability to make red blood cells. If there are not enough red blood cells, the body does not get enough oxygen. WHAT HEMATOCRIT LEVEL IS REQUIRED TO DONATE BLOOD?  The lower limit of an acceptable hematocrit for blood donors is 38%. If you have a low hematocrit value, you should schedule an appointment with your caregiver. ARE BLOOD TRANSFUSIONS COMMONLY USED TO CORRECT ANEMIA, AND ARE THEY DANGEROUS?  They are used to treat anemia as a last resort. Your caregiver will find the cause of the anemia and correct it if possible. Most blood transfusions are given because of excessive bleeding at the time of surgery, with trauma, or because of bone marrow suppression in patients with cancer or leukemia on chemotherapy. Blood transfusions are safer than ever before. We also know that blood transfusions affect the immune system and may increase certain risks. There is also a concern for human error. In 1/16,000 transfusions, a patient receives a transfusion of blood that is not matched with his or her blood type.  WHAT IS IRON DEFICIENCY ANEMIA AND CAN I CORRECT IT BY CHANGING MY DIET?  Iron is an essential part of hemoglobin. Without enough hemoglobin, anemia develops and the body does not get the right amount of oxygen. Iron deficiency anemia develops after the body has had a low level of iron for a long time. This is either caused by blood loss, not taking in or absorbing enough iron, or increased demands for iron (like pregnancy or rapid growth).  Foods from animal origin such as beef, chicken, and pork, are good sources of iron. Be sure to have one of these foods at each meal. Vitamin C helps your body absorb iron. Foods rich in Vitamin C include citrus, bell pepper, strawberries, spinach and cantaloupe. In some cases, iron supplements may be needed in order to correct the iron deficiency. In the case of poor absorption, extra  iron may have to be given directly into the vein through a needle (intravenously). I HAVE BEEN DIAGNOSED WITH IRON DEFICIENCY ANEMIA AND MY CAREGIVER PRESCRIBED IRON SUPPLEMENTS. HOW LONG WILL IT TAKE FOR MY BLOOD TO BECOME NORMAL?  It depends on the degree of anemia at the beginning of treatment. Most people with mild to moderate iron deficiency, anemia will correct the anemia over a period of 2 to 3 months. But after the anemia is corrected, the iron stored by the body is still low. Caregivers often suggest an additional 6 months of oral iron therapy once the anemia has been reversed. This will help prevent the iron deficiency anemia from quickly happening again. Non-anemic adult males should take iron supplements only under the direction of a doctor, too much iron can cause liver damage.  MY HEMOGLOBIN IS 9 G/DL AND I AM SCHEDULED FOR SURGERY. SHOULD I POSTPONE THE SURGERY?  If you have Hgb of 9, you should discuss this with your caregiver right away. Many patients with similar hemoglobin levels have had surgery without problems. If minimal blood loss is expected for a minor procedure, no treatment may be necessary.  If a greater blood loss is expected for more extensive procedures, you should ask your caregiver about being treated with erythropoietin and iron. This is to accelerate the recovery of your hemoglobin to a normal level before surgery. An anemic patient who undergoes high-blood-loss surgery has a greater risk of surgical complications and need for a blood transfusion, which also carries some risk.  I HAVE BEEN TOLD THAT HEAVY MENSTRUAL PERIODS CAUSE ANEMIA. IS THERE ANYTHING I CAN DO TO PREVENT THE ANEMIA?  Anemia that results from heavy periods is usually due to iron deficiency. You can try to meet the increased demands for iron caused by the heavy monthly blood loss by increasing the intake of iron-rich foods. Iron supplements may be required. Discuss your concerns with your caregiver. WHAT  CAUSES ANEMIA DURING PREGNANCY?  Pregnancy places major demands on the body. The mother must meet the needs of both her body and her growing baby. The body needs enough iron and folate to make the right amount of red blood cells. To prevent anemia while pregnant, the mother should stay in close contact with her caregiver.  Be sure to eat a diet that has foods rich in iron and folate like liver and dark green leafy vegetables. Folate plays an important role in the normal development of a baby's spinal cord. Folate can help prevent serious disorders like spina bifida. If your diet does not provide adequate nutrients, you may want to talk with your caregiver about nutritional supplements.  WHAT IS THE RELATIONSHIP BETWEEN FIBROID TUMORS AND ANEMIA IN WOMEN?  The relationship is usually caused by the increased menstrual blood loss caused by fibroids. Good iron intake may be required to prevent iron deficiency anemia from developing.  Document Released: 01/25/2004 Document Revised: 06/07/2011 Document Reviewed: 07/11/2010 [facility] Patient Information 2012 [place], [place].

## 2011-11-16 NOTE — Op Note (Signed)
[facility] [address] [place], [place]  [address]  COLONOSCOPY PROCEDURE REPORT  PATIENT:  [name], [name]  MR#:  [id number] BIRTHDATE:  April 21, 1928, 84 yrs. old  GENDER:  female ENDOSCOPIST:  Iva Boop, MD, [facility] REF. BY: PROCEDURE DATE:  11/16/2011 PROCEDURE:  Colonoscopy with biopsy ASA CLASS:  Class III INDICATIONS:  Iron deficiency anemia MEDICATIONS:   Fentanyl 75 mcg IV, Versed 7 mg IV  DESCRIPTION OF PROCEDURE:   After the risks benefits and alternatives of the procedure were thoroughly explained, informed consent was obtained.  Digital rectal exam was performed and revealed no abnormalities.   The [id number] ([id number]) endoscope was introduced through the anus and advanced to the cecum, which was identified by both the appendix and ileocecal valve, without limitations.  The quality of the prep was excellent, using Colyte. The instrument was then slowly withdrawn as the colon was fully examined. <<PROCEDUREIMAGES>>  FINDINGS:  A mass was found in the ascending colon. 1/3 circumference, firm and friable with superficial ulceration. About 3-4 cm. Several cm distal to cecum. Can see cecum from the location. Multiple biopsies were obtained and sent to pathology. Severe diverticulosis was found in the sigmoid colon.  Scattered diverticula were found in the right colon.  This was otherwise a normal examination of the colon.   Retroflexed views in the rectum revealed no abnormalities.    The time to cecum = 5:00 minutes. The scope was then withdrawn in 11:00 minutes from the cecum and the procedure completed. COMPLICATIONS:  None ENDOSCOPIC IMPRESSION: 1) Mass in the ascending colon  suspect carcinoma causing blood loss anemia - biopsied 2) Severe diverticulosis in the sigmoid colon 3) Diverticula, scattered in the right colon 4) Otherwise normal examination, excellent prep RECOMMENDATIONS: 1) Await biopsy results 2) Stay off ASA 3) ferrous  sulfate supplements 4) Can go home today (no driving after sedation) - I will call pathology results and plans. I did call husband and explain things today. REPEAT EXAM:  In for Colonoscopy, pending biopsy results.  Iva Boop, MD, [facility]  CC:  Merlene Laughter, MD and The Patient  n. eSIGNED:   Iva Boop at 11/16/2011 01:28 PM  Doreene Eland, [phone]

## 2011-11-19 ENCOUNTER — Encounter: Payer: Self-pay | Admitting: Internal Medicine

## 2011-11-19 ENCOUNTER — Telehealth: Payer: Self-pay | Admitting: Internal Medicine

## 2011-11-19 ENCOUNTER — Encounter (HOSPITAL_COMMUNITY): Payer: Self-pay | Admitting: Internal Medicine

## 2011-11-19 DIAGNOSIS — I129 Hypertensive chronic kidney disease with stage 1 through stage 4 chronic kidney disease, or unspecified chronic kidney disease: Secondary | ICD-10-CM | POA: Diagnosis not present

## 2011-11-19 DIAGNOSIS — C189 Malignant neoplasm of colon, unspecified: Secondary | ICD-10-CM

## 2011-11-19 DIAGNOSIS — D649 Anemia, unspecified: Secondary | ICD-10-CM | POA: Diagnosis not present

## 2011-11-19 DIAGNOSIS — C182 Malignant neoplasm of ascending colon: Secondary | ICD-10-CM | POA: Diagnosis not present

## 2011-11-19 NOTE — Progress Notes (Signed)
Quick Note:  Adenocarcinoma  Repeat colonoscopy 1 year 10/2012  ______

## 2011-11-19 NOTE — Telephone Encounter (Signed)
Patient is scheduled for CT abd/Pelvis with contrast @ [place] 11/22/11 2:00.  She is advised to come pick up her contrast and instructions.  She is also aware to go to the lab for CEA level.  Per patient Dr. Laverle Hobby office is setting her up with [facility].  They have tried to get an appt with Dr. Derrell Lolling, Jamey Ripa, [place] or [place].   They are going to notify her of the appt date and time.

## 2011-11-19 NOTE — Telephone Encounter (Signed)
Patient reports that she has black stools and doesn't feel as well as she did when she left the hospital.  I asked her if she had black stools prior to starting iron and she states no.  She has a follow up with Dr Pete Glatter today at 1:45.  I have faxed a copy of the colonoscopy and the D/C from the hospital to him to have for the appt today.  She is advised that the bx is not back and we will call her with the results when we have them.  Dr. Leone Payor do you have any additional orders or recommendations.

## 2011-11-19 NOTE — Telephone Encounter (Signed)
I spoke to her - biopsy is back and sjhows adenocarcinoma  I will call her again later today re: choice of surgeon after she sees PCP Please print and fax pathology also to Dr. Pete Glatter  Please arrange a CT of abd/pelvis with IV contrast She also needs a CEA level

## 2011-11-21 ENCOUNTER — Other Ambulatory Visit: Payer: Medicare Other

## 2011-11-21 DIAGNOSIS — C189 Malignant neoplasm of colon, unspecified: Secondary | ICD-10-CM

## 2011-11-21 DIAGNOSIS — M545 Low back pain: Secondary | ICD-10-CM | POA: Diagnosis not present

## 2011-11-22 ENCOUNTER — Ambulatory Visit (INDEPENDENT_AMBULATORY_CARE_PROVIDER_SITE_OTHER)
Admission: RE | Admit: 2011-11-22 | Discharge: 2011-11-22 | Disposition: A | Discharge status: Home / Self Care | Payer: Medicare Other | Source: Ambulatory Visit | Attending: Internal Medicine | Admitting: Internal Medicine

## 2011-11-22 DIAGNOSIS — M799 Soft tissue disorder, unspecified: Secondary | ICD-10-CM | POA: Diagnosis not present

## 2011-11-22 DIAGNOSIS — C189 Malignant neoplasm of colon, unspecified: Secondary | ICD-10-CM | POA: Diagnosis not present

## 2011-11-22 DIAGNOSIS — R1903 Right lower quadrant abdominal swelling, mass and lump: Secondary | ICD-10-CM | POA: Diagnosis not present

## 2011-11-22 MED ORDER — IOHEXOL 300 MG/ML  SOLN
100.0000 mL | Freq: Once | INTRAMUSCULAR | Status: AC | PRN
  Administered 2011-11-22: 100 mL via INTRAVENOUS

## 2011-11-22 NOTE — Progress Notes (Signed)
Quick Note:  Please tell her that there is no evidence of cancer spread based upon CT Final analysis on that will be when she has operation but this is good news so far  She sees Dr. Jamey Ripa tomorrow and he will be able to see the CT results ______

## 2011-11-23 ENCOUNTER — Encounter (INDEPENDENT_AMBULATORY_CARE_PROVIDER_SITE_OTHER): Payer: Self-pay | Admitting: Surgery

## 2011-11-23 ENCOUNTER — Ambulatory Visit (INDEPENDENT_AMBULATORY_CARE_PROVIDER_SITE_OTHER): Payer: Medicare Other | Admitting: Surgery

## 2011-11-23 VITALS — BP 170/92 | HR 87 | Temp 97.9°F | Ht 60.0 in | Wt 172.6 lb

## 2011-11-23 DIAGNOSIS — C182 Malignant neoplasm of ascending colon: Secondary | ICD-10-CM | POA: Diagnosis not present

## 2011-11-23 MED ORDER — PEG 3350-KCL-NABCB-NACL-NASULF 236 G PO SOLR: 4.0000 L | Freq: Once | ORAL | Status: AC

## 2011-11-23 NOTE — Progress Notes (Signed)
Patient ID: [name], female   DOB: 09/15/1927, 78 y.o.   MRN: [id number]  Chief Complaint  Patient presents with  . Colon Cancer    HPI Pheobe S Mcgrath is a 78 y.o. female.  She was recently hospitalized with some weakness, found to be anemic and then found to have a carcinoma of the ascending colon. She was transfused and symptoms resolved and she was discharged. She comes today to discuss surgical resection of the colon cancer HPI  Past Medical History  Diagnosis Date  . Arthritis   . Asthma   . CAD (coronary artery disease)   . Adenomatous colon polyp   . Diverticulosis   . HLD (hyperlipidemia)   . Obesity   . Internal hemorrhoids   . Osteoporosis   . Anemia   . Blood transfusion   . Cancer   . Heart attack     Past Surgical History  Procedure Date  . Appendectomy   . Cholecystectomy   . Coronary angioplasty with stent placement   . Colonoscopy w/ biopsies and polypectomy 06/15/2008    adenomatous polyps, diverticulosis, internal hemorrhoids  . Total hip arthroplasty 09/08/2007    left, Dr. Aluisio  . Colonoscopy 11/16/2011    Procedure: COLONOSCOPY;  Surgeon: Carl E Gessner, MD;  Location: [facility];  Service: Endoscopy;  Laterality: N/A;  . Knee replacement     left    Family History  Problem Relation Age of Onset  . Pancreatic cancer Father   . Cancer Father     prostate  . Pancreatic cancer Brother   . Cancer Brother     colon  . Heart disease Mother   . Heart disease Sister   . Colon cancer Brother     ? Colostomy  . Cancer Brother     prostate    Social History History  Substance Use Topics  . Smoking status: Former Smoker -- 30 years    Types: Cigarettes  . Smokeless tobacco: Former User    Quit date: 11/23/1982  . Alcohol Use: Yes     occ wine    Allergies  Allergen Reactions  . Atorvastatin     REACTION: shortness of breath, cramping in back  . Clopidogrel Bisulfate     REACTION: shortness of breath, cramping in back  .  Codeine     REACTION: vomiting  . Sulfonamide Derivatives     REACTION: intestinal upset    Current Outpatient Prescriptions  Medication Sig Dispense Refill  . acetaminophen (TYLENOL) 325 MG tablet Take 650 mg by mouth every 6 (six) hours as needed. For pain      . albuterol (PROVENTIL HFA;VENTOLIN HFA) 108 (90 BASE) MCG/ACT inhaler Inhale 2 puffs into the lungs every 4 (four) hours as needed. For shortness of breath      . calcium gluconate 500 MG tablet Take 1,000 mg by mouth daily.      . ferrous sulfate 325 (65 FE) MG EC tablet Take 162.5 mg by mouth daily.      . fexofenadine (ALLEGRA) 180 MG tablet Take 180 mg by mouth daily.      . lovastatin (ALTOPREV) 20 MG 24 hr tablet Take 20 mg by mouth at bedtime.      . metoprolol (LOPRESSOR) 50 MG tablet Take 25 mg by mouth 2 (two) times daily.       . triamterene-hydrochlorothiazide (DYAZIDE) 37.5-25 MG per capsule Take 1 capsule by mouth every morning.      . DISCONTD: ferrous   sulfate 325 (65 FE) MG EC tablet Take 1 tablet (325 mg total) by mouth 2 (two) times daily with a meal.      . polyethylene glycol (GOLYTELY) 236 G solution Take 4,000 mLs by mouth once.  4000 mL  0   No current facility-administered medications for this visit.   Facility-Administered Medications Ordered in Other Visits  Medication Dose Route Frequency Provider Last Rate Last Dose  . iohexol (OMNIPAQUE) 300 MG/ML solution 100 mL  100 mL Intravenous Once PRN Medication Radiologist, MD   100 mL at 11/22/11 1413    Review of Systems Review of Systems  Constitutional: Negative for fever, chills and unexpected weight change.  HENT: Negative for hearing loss, congestion, sore throat, trouble swallowing and voice change.   Eyes: Negative for visual disturbance.  Respiratory: Negative for cough and wheezing.   Cardiovascular: Negative for chest pain, palpitations and leg swelling.  Gastrointestinal: Negative for nausea, vomiting, abdominal pain, diarrhea, constipation,  blood in stool, abdominal distention and anal bleeding.  Genitourinary: Negative for hematuria, vaginal bleeding and difficulty urinating.  Musculoskeletal: Negative for arthralgias.  Skin: Negative for rash and wound.  Neurological: Negative for seizures, syncope and headaches.  Hematological: Negative for adenopathy. Does not bruise/bleed easily.  Psychiatric/Behavioral: Negative for confusion.    Blood pressure 170/92, pulse 87, temperature 97.9 F (36.6 C), temperature source Temporal, height 5' (1.524 m), weight 172 lb 9.6 oz (78.291 kg), SpO2 95.00%.  Physical Exam Physical Exam  Data Reviewed Hospital records reviewed, colonoscopy notes path reports etc.  Assessment    Cancer ascending colon Hx CAD    Plan    Discussed with Dr Weintraub if she needs additonal cardiac w/o but he doesn't think so - will review his records Tuesday to be sure. Will tentatively schedule for about two weeks in case any cardiac w/o needed. Discussed surgery, risks etc with patient and husband. Will try to do Lap assisted, but prior open GB may preclude this approach.       Ojas Coone J 11/23/2011, 11:40 AM    

## 2011-11-30 DIAGNOSIS — E782 Mixed hyperlipidemia: Secondary | ICD-10-CM | POA: Diagnosis not present

## 2011-11-30 DIAGNOSIS — I2510 Atherosclerotic heart disease of native coronary artery without angina pectoris: Secondary | ICD-10-CM | POA: Diagnosis not present

## 2011-11-30 DIAGNOSIS — I10 Essential (primary) hypertension: Secondary | ICD-10-CM | POA: Diagnosis not present

## 2011-11-30 DIAGNOSIS — Z9861 Coronary angioplasty status: Secondary | ICD-10-CM | POA: Diagnosis not present

## 2011-12-03 ENCOUNTER — Telehealth (INDEPENDENT_AMBULATORY_CARE_PROVIDER_SITE_OTHER): Payer: Self-pay

## 2011-12-03 NOTE — Telephone Encounter (Signed)
[name] aware we did receive letter from Dr Alanda Amass that stated he spoke with Dr Jamey Ripa and gave the okay and they would contact [name] last week if any additional testing was needed. I told [name] I would let her know if I heard anything different from their office.

## 2011-12-03 NOTE — Telephone Encounter (Signed)
Pt calling to see if we have received clearance from Dr Alanda Amass. I did not see it in epic and advised pt I would send a msg to Dr Jamey Ripa Lesly Rubenstein to follow up and call her back.

## 2011-12-04 ENCOUNTER — Telehealth (INDEPENDENT_AMBULATORY_CARE_PROVIDER_SITE_OTHER): Payer: Self-pay | Admitting: General Surgery

## 2011-12-04 DIAGNOSIS — M545 Low back pain: Secondary | ICD-10-CM | POA: Diagnosis not present

## 2011-12-04 NOTE — Telephone Encounter (Signed)
Called Dr Kandis Cocking office to get final clearance on patient post stress test being done. Left message for them to contact me back.

## 2011-12-04 NOTE — Telephone Encounter (Signed)
Pt calling to see if Dr. Alanda Amass had sent her clearance yet.  She had stress test on Friday, 11/30/11.  Explained [facility] will FAX a clearance form to [place] after the results have been reviewed by Dr. Alanda Amass.

## 2011-12-05 NOTE — Telephone Encounter (Signed)
Patient aware clearance received.

## 2011-12-06 ENCOUNTER — Encounter (HOSPITAL_COMMUNITY): Payer: Self-pay | Admitting: Pharmacy Technician

## 2011-12-06 ENCOUNTER — Encounter (HOSPITAL_COMMUNITY): Payer: Self-pay

## 2011-12-06 ENCOUNTER — Encounter (HOSPITAL_COMMUNITY)
Admission: RE | Admit: 2011-12-06 | Discharge: 2011-12-06 | Disposition: A | Discharge status: Home / Self Care | Payer: Medicare Other | Source: Ambulatory Visit | Attending: Surgery | Admitting: Surgery

## 2011-12-06 HISTORY — DX: Adverse effect of unspecified anesthetic, initial encounter: T41.45XA

## 2011-12-06 LAB — URINALYSIS, ROUTINE W REFLEX MICROSCOPIC
Bilirubin Urine: NEGATIVE
Glucose, UA: NEGATIVE mg/dL
Ketones, ur: NEGATIVE mg/dL
Leukocytes, UA: NEGATIVE
pH: 6.5 (ref 5.0–8.0)

## 2011-12-06 LAB — DIFFERENTIAL
Eosinophils Relative: 1 % (ref 0–5)
Lymphocytes Relative: 23 % (ref 12–46)
Lymphs Abs: 3 10*3/uL (ref 0.7–4.0)
Monocytes Absolute: 0.9 10*3/uL (ref 0.1–1.0)
Monocytes Relative: 7 % (ref 3–12)

## 2011-12-06 LAB — CBC
MCH: 26.4 pg (ref 26.0–34.0)
MCHC: 30.9 g/dL (ref 30.0–36.0)
Platelets: 368 10*3/uL (ref 150–400)

## 2011-12-06 LAB — COMPREHENSIVE METABOLIC PANEL
ALT: 25 U/L (ref 0–35)
AST: 25 U/L (ref 0–37)
Albumin: 4 g/dL (ref 3.5–5.2)
Calcium: 9.8 mg/dL (ref 8.4–10.5)
Sodium: 135 mEq/L (ref 135–145)
Total Protein: 7.2 g/dL (ref 6.0–8.3)

## 2011-12-06 LAB — SURGICAL PCR SCREEN: MRSA, PCR: NEGATIVE

## 2011-12-06 NOTE — Patient Instructions (Signed)
YOUR SURGERY IS SCHEDULED ON:  Thursday  6/13  AT 9:30 AM  REPORT TO [place] SHORT STAY CENTER AT:  7:30 AM      PHONE # FOR SHORT STAY IS [phone]  FOLLOW YOUR BOWEL PREP INSTRUCTIONS DAY BEFORE SURGERY FROM DR. STRECK'S OFFICE  DO NOT EAT OR DRINK ANYTHING AFTER MIDNIGHT THE NIGHT BEFORE YOUR SURGERY.  YOU MAY BRUSH YOUR TEETH, RINSE OUT YOUR MOUTH--BUT NO WATER, NO FOOD, NO CHEWING GUM, NO MINTS, NO CANDIES, NO CHEWING TOBACCO.  PLEASE TAKE THE FOLLOWING MEDICATIONS THE AM OF YOUR SURGERY WITH A FEW SIPS OF WATER:  METOPROLOL, CERTRIZINE.  USE YOUR ALBUTEROL INHALER AND BRING TO HOSPITAL TO TAKE TO SURGERY.    IF YOU USE INHALERS--USE YOUR INHALERS THE AM OF YOUR SURGERY AND BRING INHALERS TO THE HOSPITAL -TAKE TO SURGERY.    IF YOU ARE DIABETIC:  DO NOT TAKE ANY DIABETIC MEDICATIONS THE AM OF YOUR SURGERY.  IF YOU TAKE INSULIN IN THE EVENINGS--PLEASE ONLY TAKE 1/2 NORMAL EVENING DOSE THE NIGHT BEFORE YOUR SURGERY.  NO INSULIN THE AM OF YOUR SURGERY.  IF YOU HAVE SLEEP APNEA AND USE CPAP OR BIPAP--PLEASE BRING THE MASK --NOT THE MACHINE-NOT THE TUBING   -JUST THE MASK. DO NOT BRING VALUABLES, MONEY, CREDIT CARDS.  CONTACT LENS, DENTURES / PARTIALS, GLASSES SHOULD NOT BE WORN TO SURGERY AND IN MOST CASES-HEARING AIDS WILL NEED TO BE REMOVED.  BRING YOUR GLASSES CASE, ANY EQUIPMENT NEEDED FOR YOUR CONTACT LENS. FOR PATIENTS ADMITTED TO THE HOSPITAL--CHECK OUT TIME THE DAY OF DISCHARGE IS 11:00 AM.  ALL INPATIENT ROOMS ARE PRIVATE - WITH BATHROOM, TELEPHONE, TELEVISION AND WIFI INTERNET. IF YOU ARE BEING DISCHARGED THE SAME DAY OF YOUR SURGERY--YOU CAN NOT DRIVE YOURSELF HOME--AND SHOULD NOT GO HOME ALONE BY TAXI OR BUS.  NO DRIVING OR OPERATING MACHINERY FOR 24 HOURS FOLLOWING ANESTHESIA / PAIN MEDICATIONS.                            SPECIAL INSTRUCTIONS:  CHLORHEXIDINE SOAP SHOWER (other brand names are Betasept and Hibiclens ) PLEASE SHOWER WITH CHLORHEXIDINE THE NIGHT BEFORE YOUR  SURGERY AND THE AM OF YOUR SURGERY. DO NOT USE CHLORHEXIDINE ON YOUR FACE OR PRIVATE AREAS--YOU MAY USE YOUR NORMAL SOAP THOSE AREAS AND YOUR NORMAL SHAMPOO.  WOMEN SHOULD AVOID SHAVING UNDER ARMS AND SHAVING LEGS 48 HOURS BEFORE USING CHLORHEXIDINE TO AVOID SKIN IRRITATION.  DO NOT USE IF ALLERGIC TO CHLORHEXIDINE.  PLEASE READ OVER ANY  FACT SHEETS THAT YOU WERE GIVEN: MRSA INFORMATION, BLOOD TRANSFUSION INFORMATION

## 2011-12-06 NOTE — Pre-Procedure Instructions (Signed)
NUCLEAR STRESS TEST REPORT 11/30/11 AND CARDIOLOGY OFFICE NOTE 11/23/11 ON PT'S CHART FROM DR. Alanda Amass & [facility]. EKG REPORT 11/14/11 ON CHART FROM [facility] AND IN EPIC. CXR REPORT 08/27/11 ON CHART FROM [facility] AND IN EPIC. CBC, DIFF, CMET, UA WERE DONE TODAY - PREOP AT [facility] --T/S WILL BE DONE DAY OF SURGERY. PREOP INSTRUCTIONS DISCUSSED WITH PT USING TEACH BACK METHOD.

## 2011-12-07 NOTE — Pre-Procedure Instructions (Signed)
PT'S PREOP CMET  - BUN 27, CREAT 1.19 --AND RESULTS WERE REVIEWED BY STRECK THIS AM PER THE CMET REPORT IN EPIC.

## 2011-12-13 ENCOUNTER — Ambulatory Visit (HOSPITAL_COMMUNITY): Payer: Medicare Other | Admitting: Anesthesiology

## 2011-12-13 ENCOUNTER — Encounter (HOSPITAL_COMMUNITY): Payer: Self-pay | Admitting: Anesthesiology

## 2011-12-13 ENCOUNTER — Encounter (HOSPITAL_COMMUNITY): Payer: Self-pay

## 2011-12-13 ENCOUNTER — Encounter (HOSPITAL_COMMUNITY)
Admission: RE | Disposition: A | Discharge status: Home / Self Care | Payer: Self-pay | Source: Ambulatory Visit | Attending: Surgery

## 2011-12-13 ENCOUNTER — Inpatient Hospital Stay (HOSPITAL_COMMUNITY)
Admission: RE | Admit: 2011-12-13 | Discharge: 2011-12-18 | DRG: 331 | Disposition: A | Discharge status: Home / Self Care | Payer: Medicare Other | Source: Ambulatory Visit | Attending: Surgery | Admitting: Surgery

## 2011-12-13 DIAGNOSIS — Z01812 Encounter for preprocedural laboratory examination: Secondary | ICD-10-CM

## 2011-12-13 DIAGNOSIS — I10 Essential (primary) hypertension: Secondary | ICD-10-CM | POA: Diagnosis present

## 2011-12-13 DIAGNOSIS — C189 Malignant neoplasm of colon, unspecified: Secondary | ICD-10-CM | POA: Diagnosis not present

## 2011-12-13 DIAGNOSIS — D500 Iron deficiency anemia secondary to blood loss (chronic): Secondary | ICD-10-CM | POA: Diagnosis present

## 2011-12-13 DIAGNOSIS — C19 Malignant neoplasm of rectosigmoid junction: Secondary | ICD-10-CM

## 2011-12-13 DIAGNOSIS — I2510 Atherosclerotic heart disease of native coronary artery without angina pectoris: Secondary | ICD-10-CM | POA: Diagnosis not present

## 2011-12-13 DIAGNOSIS — E669 Obesity, unspecified: Secondary | ICD-10-CM | POA: Diagnosis present

## 2011-12-13 DIAGNOSIS — Z860100 Personal history of colon polyps, unspecified: Secondary | ICD-10-CM

## 2011-12-13 DIAGNOSIS — Z9861 Coronary angioplasty status: Secondary | ICD-10-CM | POA: Diagnosis not present

## 2011-12-13 DIAGNOSIS — K648 Other hemorrhoids: Secondary | ICD-10-CM | POA: Diagnosis not present

## 2011-12-13 DIAGNOSIS — D649 Anemia, unspecified: Secondary | ICD-10-CM | POA: Diagnosis not present

## 2011-12-13 DIAGNOSIS — E785 Hyperlipidemia, unspecified: Secondary | ICD-10-CM | POA: Diagnosis present

## 2011-12-13 DIAGNOSIS — C182 Malignant neoplasm of ascending colon: Secondary | ICD-10-CM | POA: Diagnosis not present

## 2011-12-13 DIAGNOSIS — I129 Hypertensive chronic kidney disease with stage 1 through stage 4 chronic kidney disease, or unspecified chronic kidney disease: Secondary | ICD-10-CM | POA: Diagnosis not present

## 2011-12-13 DIAGNOSIS — I252 Old myocardial infarction: Secondary | ICD-10-CM | POA: Diagnosis not present

## 2011-12-13 DIAGNOSIS — K660 Peritoneal adhesions (postprocedural) (postinfection): Secondary | ICD-10-CM | POA: Diagnosis present

## 2011-12-13 SURGERY — COLECTOMY, RIGHT, LAPAROSCOPIC
Anesthesia: General | Laterality: Right | Wound class: Clean Contaminated

## 2011-12-13 MED ORDER — ALVIMOPAN 12 MG PO CAPS
12.0000 mg | ORAL_CAPSULE | Freq: Two times a day (BID) | ORAL | Status: DC
  Administered 2011-12-14 – 2011-12-16 (×5): 12 mg via ORAL
  Filled 2011-12-13 (×6): qty 1

## 2011-12-13 MED ORDER — CISATRACURIUM BESYLATE (PF) 10 MG/5ML IV SOLN
INTRAVENOUS | Status: DC | PRN
  Administered 2011-12-13: 4 mg via INTRAVENOUS
  Administered 2011-12-13: 10 mg via INTRAVENOUS

## 2011-12-13 MED ORDER — ACETAMINOPHEN 10 MG/ML IV SOLN
INTRAVENOUS | Status: AC
  Filled 2011-12-13: qty 100

## 2011-12-13 MED ORDER — ACETAMINOPHEN 10 MG/ML IV SOLN
INTRAVENOUS | Status: DC | PRN
  Administered 2011-12-13: 1000 mg via INTRAVENOUS

## 2011-12-13 MED ORDER — HEPARIN SODIUM (PORCINE) 5000 UNIT/ML IJ SOLN
5000.0000 [IU] | Freq: Three times a day (TID) | INTRAMUSCULAR | Status: DC
  Administered 2011-12-14 – 2011-12-18 (×13): 5000 [IU] via SUBCUTANEOUS
  Filled 2011-12-13 (×16): qty 1

## 2011-12-13 MED ORDER — ONDANSETRON HCL 4 MG/2ML IJ SOLN: 4.0000 mg | Freq: Four times a day (QID) | INTRAMUSCULAR | Status: DC | PRN

## 2011-12-13 MED ORDER — FENTANYL CITRATE 0.05 MG/ML IJ SOLN
INTRAMUSCULAR | Status: DC | PRN
  Administered 2011-12-13 (×2): 100 ug via INTRAVENOUS
  Administered 2011-12-13 (×2): 50 ug via INTRAVENOUS
  Administered 2011-12-13 (×2): 100 ug via INTRAVENOUS

## 2011-12-13 MED ORDER — MORPHINE SULFATE (PF) 1 MG/ML IV SOLN
INTRAVENOUS | Status: AC
  Filled 2011-12-13: qty 25

## 2011-12-13 MED ORDER — NALOXONE HCL 0.4 MG/ML IJ SOLN: 0.4000 mg | INTRAMUSCULAR | Status: DC | PRN

## 2011-12-13 MED ORDER — LIDOCAINE HCL (CARDIAC) 20 MG/ML IV SOLN
INTRAVENOUS | Status: DC | PRN
  Administered 2011-12-13: 50 mg via INTRAVENOUS

## 2011-12-13 MED ORDER — LACTATED RINGERS IV SOLN: INTRAVENOUS | Status: DC | PRN

## 2011-12-13 MED ORDER — DIPHENHYDRAMINE HCL 50 MG/ML IJ SOLN: 12.5000 mg | Freq: Four times a day (QID) | INTRAMUSCULAR | Status: DC | PRN

## 2011-12-13 MED ORDER — KCL IN DEXTROSE-NACL 20-5-0.45 MEQ/L-%-% IV SOLN
INTRAVENOUS | Status: AC
  Filled 2011-12-13: qty 1000

## 2011-12-13 MED ORDER — MEPERIDINE HCL 50 MG/ML IJ SOLN: 6.2500 mg | INTRAMUSCULAR | Status: DC | PRN

## 2011-12-13 MED ORDER — HEPARIN SODIUM (PORCINE) 5000 UNIT/ML IJ SOLN: 5000.0000 [IU] | Freq: Once | INTRAMUSCULAR | Status: DC

## 2011-12-13 MED ORDER — ALBUTEROL SULFATE HFA 108 (90 BASE) MCG/ACT IN AERS
2.0000 | INHALATION_SPRAY | RESPIRATORY_TRACT | Status: DC | PRN
  Filled 2011-12-13: qty 6.7

## 2011-12-13 MED ORDER — MORPHINE SULFATE (PF) 1 MG/ML IV SOLN
INTRAVENOUS | Status: DC
  Administered 2011-12-13: 13:00:00 via INTRAVENOUS
  Administered 2011-12-13: 8 mg via INTRAVENOUS
  Administered 2011-12-14: 5 mg via INTRAVENOUS
  Administered 2011-12-14: 05:00:00 via INTRAVENOUS
  Filled 2011-12-13: qty 25

## 2011-12-13 MED ORDER — ALVIMOPAN 12 MG PO CAPS
ORAL_CAPSULE | ORAL | Status: AC
  Administered 2011-12-13: 12 mg via ORAL
  Filled 2011-12-13: qty 1

## 2011-12-13 MED ORDER — DEXTROSE 5 % IV SOLN
1.0000 g | INTRAVENOUS | Status: AC
  Administered 2011-12-13: 1 g via INTRAVENOUS

## 2011-12-13 MED ORDER — CHLORHEXIDINE GLUCONATE 4 % EX LIQD: 1.0000 "application " | Freq: Once | CUTANEOUS | Status: DC

## 2011-12-13 MED ORDER — ONDANSETRON HCL 4 MG/2ML IJ SOLN
INTRAMUSCULAR | Status: DC | PRN
  Administered 2011-12-13: 4 mg via INTRAVENOUS

## 2011-12-13 MED ORDER — HYDROMORPHONE HCL PF 1 MG/ML IJ SOLN
INTRAMUSCULAR | Status: DC | PRN
  Administered 2011-12-13 (×4): 0.5 mg via INTRAVENOUS

## 2011-12-13 MED ORDER — CHLORHEXIDINE GLUCONATE 4 % EX LIQD
1.0000 "application " | Freq: Once | CUTANEOUS | Status: DC
  Filled 2011-12-13: qty 15

## 2011-12-13 MED ORDER — SODIUM CHLORIDE 0.9 % IJ SOLN: 9.0000 mL | INTRAMUSCULAR | Status: DC | PRN

## 2011-12-13 MED ORDER — ALVIMOPAN 12 MG PO CAPS
12.0000 mg | ORAL_CAPSULE | Freq: Once | ORAL | Status: AC
  Administered 2011-12-13: 12 mg via ORAL

## 2011-12-13 MED ORDER — METOPROLOL TARTRATE 25 MG PO TABS
25.0000 mg | ORAL_TABLET | Freq: Two times a day (BID) | ORAL | Status: DC
  Administered 2011-12-13 – 2011-12-18 (×10): 25 mg via ORAL
  Filled 2011-12-13 (×11): qty 1

## 2011-12-13 MED ORDER — LACTATED RINGERS IV SOLN: INTRAVENOUS | Status: DC

## 2011-12-13 MED ORDER — PROMETHAZINE HCL 25 MG/ML IJ SOLN: 6.2500 mg | INTRAMUSCULAR | Status: DC | PRN

## 2011-12-13 MED ORDER — GLYCOPYRROLATE 0.2 MG/ML IJ SOLN
INTRAMUSCULAR | Status: DC | PRN
  Administered 2011-12-13: 0.6 mg via INTRAVENOUS

## 2011-12-13 MED ORDER — LACTATED RINGERS IV SOLN
INTRAVENOUS | Status: DC
  Administered 2011-12-13 (×2): via INTRAVENOUS
  Administered 2011-12-13: 1000 mL via INTRAVENOUS

## 2011-12-13 MED ORDER — KCL IN DEXTROSE-NACL 20-5-0.45 MEQ/L-%-% IV SOLN
INTRAVENOUS | Status: DC
  Administered 2011-12-13 – 2011-12-16 (×7): via INTRAVENOUS
  Filled 2011-12-13 (×11): qty 1000

## 2011-12-13 MED ORDER — CEFOXITIN SODIUM-DEXTROSE 1-4 GM-% IV SOLR (PREMIX)
INTRAVENOUS | Status: AC
  Filled 2011-12-13: qty 50

## 2011-12-13 MED ORDER — HEPARIN SODIUM (PORCINE) 5000 UNIT/ML IJ SOLN
INTRAMUSCULAR | Status: AC
  Administered 2011-12-13: 5000 [IU] via SUBCUTANEOUS
  Filled 2011-12-13: qty 1

## 2011-12-13 MED ORDER — DIPHENHYDRAMINE HCL 12.5 MG/5ML PO ELIX: 12.5000 mg | ORAL_SOLUTION | Freq: Four times a day (QID) | ORAL | Status: DC | PRN

## 2011-12-13 MED ORDER — HYDROMORPHONE HCL PF 1 MG/ML IJ SOLN: 0.2500 mg | INTRAMUSCULAR | Status: DC | PRN

## 2011-12-13 MED ORDER — NEOSTIGMINE METHYLSULFATE 1 MG/ML IJ SOLN
INTRAMUSCULAR | Status: DC | PRN
  Administered 2011-12-13: 4 mg via INTRAVENOUS

## 2011-12-13 MED ORDER — MIDAZOLAM HCL 5 MG/5ML IJ SOLN
INTRAMUSCULAR | Status: DC | PRN
  Administered 2011-12-13 (×2): 1 mg via INTRAVENOUS

## 2011-12-13 MED ORDER — LABETALOL HCL 5 MG/ML IV SOLN
INTRAVENOUS | Status: DC | PRN
  Administered 2011-12-13 (×2): 5 mg via INTRAVENOUS

## 2011-12-13 MED ORDER — PROPOFOL 10 MG/ML IV BOLUS
INTRAVENOUS | Status: DC | PRN
  Administered 2011-12-13: 130 mg via INTRAVENOUS

## 2011-12-13 SURGICAL SUPPLY — 65 items
APPLIER CLIP ROT 10 11.4 M/L (STAPLE) ×2
APR CLP MED LRG 11.4X10 (STAPLE) ×1
BLADE HEX COATED 2.75 (ELECTRODE) ×2 IMPLANT
BLADE SURG SZ10 CARB STEEL (BLADE) ×4 IMPLANT
CANISTER SUCTION 2500CC (MISCELLANEOUS) ×2 IMPLANT
CANNULA ENDOPATH XCEL 11M (ENDOMECHANICALS) IMPLANT
CELLS DAT CNTRL 66122 CELL SVR (MISCELLANEOUS) ×1 IMPLANT
CLIP APPLIE ROT 10 11.4 M/L (STAPLE) IMPLANT
CLOTH BEACON ORANGE TIMEOUT ST (SAFETY) ×2 IMPLANT
COVER MAYO STAND STRL (DRAPES) ×2 IMPLANT
DECANTER SPIKE VIAL GLASS SM (MISCELLANEOUS) ×2 IMPLANT
DRAPE LAPAROSCOPIC ABDOMINAL (DRAPES) ×2 IMPLANT
DRAPE WARM FLUID 44X44 (DRAPE) ×2 IMPLANT
ELECT REM PT RETURN 9FT ADLT (ELECTROSURGICAL) ×2
ELECTRODE REM PT RTRN 9FT ADLT (ELECTROSURGICAL) ×1 IMPLANT
ENSEAL DEVICE STD TIP 35CM (ENDOMECHANICALS) IMPLANT
GLOVE BIOGEL PI IND STRL 7.0 (GLOVE) ×1 IMPLANT
GLOVE BIOGEL PI INDICATOR 7.0 (GLOVE) ×1
GLOVE EUDERMIC 7 POWDERFREE (GLOVE) ×2 IMPLANT
GOWN STRL NON-REIN LRG LVL3 (GOWN DISPOSABLE) ×2 IMPLANT
GOWN STRL REIN XL XLG (GOWN DISPOSABLE) ×4 IMPLANT
HAND ACTIVATED (MISCELLANEOUS) ×1 IMPLANT
KIT BASIN OR (CUSTOM PROCEDURE TRAY) ×2 IMPLANT
LEGGING LITHOTOMY PAIR STRL (DRAPES) IMPLANT
LIGASURE IMPACT 36 18CM CVD LR (INSTRUMENTS) ×1 IMPLANT
NS IRRIG 1000ML POUR BTL (IV SOLUTION) ×4 IMPLANT
PENCIL BUTTON HOLSTER BLD 10FT (ELECTRODE) ×2 IMPLANT
RELOAD PROXIMATE 75MM BLUE (ENDOMECHANICALS) ×2 IMPLANT
RELOAD STAPLE 75 3.8 BLU REG (ENDOMECHANICALS) IMPLANT
RETRACTOR WND ALEXIS 18 MED (MISCELLANEOUS) IMPLANT
RTRCTR WOUND ALEXIS 18CM MED (MISCELLANEOUS) ×2
SCALPEL HARMONIC ACE (MISCELLANEOUS) ×1 IMPLANT
SCISSORS LAP 5X35 DISP (ENDOMECHANICALS) IMPLANT
SET IRRIG TUBING LAPAROSCOPIC (IRRIGATION / IRRIGATOR) ×2 IMPLANT
SOLUTION ANTI FOG 6CC (MISCELLANEOUS) ×2 IMPLANT
SPONGE GAUZE 4X4 12PLY (GAUZE/BANDAGES/DRESSINGS) ×2 IMPLANT
SPONGE LAP 18X18 X RAY DECT (DISPOSABLE) ×2 IMPLANT
STAPLER GUN LINEAR PROX 60 (STAPLE) ×1 IMPLANT
STAPLER PROXIMATE 75MM BLUE (STAPLE) ×1 IMPLANT
STAPLER VISISTAT 35W (STAPLE) ×2 IMPLANT
SUCTION POOLE TIP (SUCTIONS) ×2 IMPLANT
SUT NOV 1 T60/GS (SUTURE) IMPLANT
SUT NOVA NAB DX-16 0-1 5-0 T12 (SUTURE) IMPLANT
SUT NOVA T20/GS 25 (SUTURE) IMPLANT
SUT PDS AB 0 CTX 60 (SUTURE) ×2 IMPLANT
SUT PDS AB 1 CTX 36 (SUTURE) ×4 IMPLANT
SUT PROLENE 2 0 KS (SUTURE) IMPLANT
SUT SILK 2 0 (SUTURE) ×2
SUT SILK 2 0 SH CR/8 (SUTURE) ×2 IMPLANT
SUT SILK 2 0SH CR/8 30 (SUTURE) IMPLANT
SUT SILK 2-0 18XBRD TIE 12 (SUTURE) ×1 IMPLANT
SUT SILK 2-0 30XBRD TIE 12 (SUTURE) IMPLANT
SUT SILK 3 0 (SUTURE) ×2
SUT SILK 3 0 SH CR/8 (SUTURE) ×2 IMPLANT
SUT SILK 3-0 18XBRD TIE 12 (SUTURE) ×1 IMPLANT
TAPE CLOTH SURG 4X10 WHT LF (GAUZE/BANDAGES/DRESSINGS) ×1 IMPLANT
TOWEL OR 17X26 10 PK STRL BLUE (TOWEL DISPOSABLE) ×2 IMPLANT
TRAY FOLEY CATH 14FRSI W/METER (CATHETERS) ×2 IMPLANT
TRAY LAP CHOLE (CUSTOM PROCEDURE TRAY) ×2 IMPLANT
TROCAR BLADELESS OPT 5 75 (ENDOMECHANICALS) ×4 IMPLANT
TROCAR XCEL BLUNT TIP 100MML (ENDOMECHANICALS) ×2 IMPLANT
TROCAR XCEL NON-BLD 11X100MML (ENDOMECHANICALS) ×2 IMPLANT
TUBING INSUFFLATION 10FT LAP (TUBING) ×2 IMPLANT
YANKAUER SUCT BULB TIP 10FT TU (MISCELLANEOUS) ×2 IMPLANT
YANKAUER SUCT BULB TIP NO VENT (SUCTIONS) ×2 IMPLANT

## 2011-12-13 NOTE — H&P (View-Only) (Signed)
Patient ID: [name], female   DOB: 06/11/1928, 78 y.o.   MRN: [id number]  Chief Complaint  Patient presents with  . Colon Cancer    HPI [name] is a 78 y.o. female.  She was recently hospitalized with some weakness, found to be anemic and then found to have a carcinoma of the ascending colon. She was transfused and symptoms resolved and she was discharged. She comes today to discuss surgical resection of the colon cancer HPI  Past Medical History  Diagnosis Date  . Arthritis   . Asthma   . CAD (coronary artery disease)   . Adenomatous colon polyp   . Diverticulosis   . HLD (hyperlipidemia)   . Obesity   . Internal hemorrhoids   . Osteoporosis   . Anemia   . Blood transfusion   . Cancer   . Heart attack     Past Surgical History  Procedure Date  . Appendectomy   . Cholecystectomy   . Coronary angioplasty with stent placement   . Colonoscopy w/ biopsies and polypectomy 06/15/2008    adenomatous polyps, diverticulosis, internal hemorrhoids  . Total hip arthroplasty 09/08/2007    left, Dr. Lequita Halt  . Colonoscopy 11/16/2011    Procedure: COLONOSCOPY;  Surgeon: Iva Boop, MD;  Location: [facility] ENDOSCOPY;  Service: Endoscopy;  Laterality: N/A;  . Knee replacement     left    Family History  Problem Relation Age of Onset  . Pancreatic cancer Father   . Cancer Father     prostate  . Pancreatic cancer Brother   . Cancer Brother     colon  . Heart disease Mother   . Heart disease Sister   . Colon cancer Brother     ? Colostomy  . Cancer Brother     prostate    Social History History  Substance Use Topics  . Smoking status: Former Smoker -- 30 years    Types: Cigarettes  . Smokeless tobacco: Former Neurosurgeon    Quit date: 11/23/1982  . Alcohol Use: Yes     occ wine    Allergies  Allergen Reactions  . Atorvastatin     REACTION: shortness of breath, cramping in back  . Clopidogrel Bisulfate     REACTION: shortness of breath, cramping in back  .  Codeine     REACTION: vomiting  . Sulfonamide Derivatives     REACTION: intestinal upset    Current Outpatient Prescriptions  Medication Sig Dispense Refill  . acetaminophen (TYLENOL) 325 MG tablet Take 650 mg by mouth every 6 (six) hours as needed. For pain      . albuterol (PROVENTIL HFA;VENTOLIN HFA) 108 (90 BASE) MCG/ACT inhaler Inhale 2 puffs into the lungs every 4 (four) hours as needed. For shortness of breath      . calcium gluconate 500 MG tablet Take 1,000 mg by mouth daily.      . ferrous sulfate 325 (65 FE) MG EC tablet Take 162.5 mg by mouth daily.      . fexofenadine (ALLEGRA) 180 MG tablet Take 180 mg by mouth daily.      [name] Kitchen lovastatin (ALTOPREV) 20 MG 24 hr tablet Take 20 mg by mouth at bedtime.      . metoprolol (LOPRESSOR) 50 MG tablet Take 25 mg by mouth 2 (two) times daily.       [name] Kitchen triamterene-hydrochlorothiazide (DYAZIDE) 37.5-25 MG per capsule Take 1 capsule by mouth every morning.      [name] Kitchen DISCONTD: ferrous  sulfate 325 (65 FE) MG EC tablet Take 1 tablet (325 mg total) by mouth 2 (two) times daily with a meal.      . polyethylene glycol (GOLYTELY) 236 G solution Take 4,000 mLs by mouth once.  4000 mL  0   No current facility-administered medications for this visit.   Facility-Administered Medications Ordered in Other Visits  Medication Dose Route Frequency Provider Last Rate Last Dose  . iohexol (OMNIPAQUE) 300 MG/ML solution 100 mL  100 mL Intravenous Once PRN Medication Radiologist, MD   100 mL at 11/22/11 1413    Review of Systems Review of Systems  Constitutional: Negative for fever, chills and unexpected weight change.  HENT: Negative for hearing loss, congestion, sore throat, trouble swallowing and voice change.   Eyes: Negative for visual disturbance.  Respiratory: Negative for cough and wheezing.   Cardiovascular: Negative for chest pain, palpitations and leg swelling.  Gastrointestinal: Negative for nausea, vomiting, abdominal pain, diarrhea, constipation,  blood in stool, abdominal distention and anal bleeding.  Genitourinary: Negative for hematuria, vaginal bleeding and difficulty urinating.  Musculoskeletal: Negative for arthralgias.  Skin: Negative for rash and wound.  Neurological: Negative for seizures, syncope and headaches.  Hematological: Negative for adenopathy. Does not bruise/bleed easily.  Psychiatric/Behavioral: Negative for confusion.    Blood pressure 170/92, pulse 87, temperature 97.9 F (36.6 C), temperature source Temporal, height 5' (1.524 m), weight 172 lb 9.6 oz (78.291 kg), SpO2 95.00%.  Physical Exam Physical Exam  Data Reviewed Hospital records reviewed, colonoscopy notes path reports etc.  Assessment    Cancer ascending colon Hx CAD    Plan    Discussed with Dr Alanda Amass if she needs additonal cardiac w/o but he doesn't think so - will review his records Tuesday to be sure. Will tentatively schedule for about two weeks in case any cardiac w/o needed. Discussed surgery, risks etc with patient and husband. Will try to do Lap assisted, but prior open GB may preclude this approach.       Analaya Hoey J 11/23/2011, 11:40 AM

## 2011-12-13 NOTE — Interval H&P Note (Signed)
History and Physical Interval Note:  12/13/2011 9:37 AM  [name]  has presented today for surgery, with the diagnosis of Cancer of the colon  The various methods of treatment have been discussed with the patient and family. After consideration of risks, benefits and other options for treatment, the patient has consented to  Procedure(s) (LRB): LAPAROSCOPIC RIGHT COLECTOMY (Right) as a surgical intervention .  The patients' history has been reviewed, patient examined, no change in status, stable for surgery.  I have reviewed the patients' chart and labs.  Questions were answered to the patient's satisfaction.     [name] J

## 2011-12-13 NOTE — Anesthesia Postprocedure Evaluation (Signed)
  Anesthesia Post-op Note  Patient: [name]  Procedure(s) Performed: Procedure(s) (LRB): LAPAROSCOPIC RIGHT COLECTOMY (Right)  Patient Location: PACU  Anesthesia Type: General  Level of Consciousness: awake and alert   Airway and Oxygen Therapy: Patient Spontanous Breathing  Post-op Pain: mild  Post-op Assessment: Post-op Vital signs reviewed, Patient's Cardiovascular Status Stable, Respiratory Function Stable, Patent Airway and No signs of Nausea or vomiting  Post-op Vital Signs: stable  Complications: No apparent anesthesia complications

## 2011-12-13 NOTE — Anesthesia Preprocedure Evaluation (Addendum)
Anesthesia Evaluation  Patient identified by MRN, date of birth, ID band Patient awake    Reviewed: Allergy & Precautions, H&P , NPO status , Patient's Chart, lab work & pertinent test results  History of Anesthesia Complications Negative for: history of anesthetic complications  Airway Mallampati: II TM Distance: >3 FB Neck ROM: Full    Dental No notable dental hx. (+) Dental Advisory Given   Pulmonary neg pulmonary ROS, asthma ,  breath sounds clear to auscultation  Pulmonary exam normal       Cardiovascular hypertension, + CAD, + Past MI and + Cardiac Stents negative cardio ROS  Rhythm:Regular Rate:Normal     Neuro/Psych negative neurological ROS  negative psych ROS   GI/Hepatic negative GI ROS, Neg liver ROS,   Endo/Other  negative endocrine ROS  Renal/GU negative Renal ROS  negative genitourinary   Musculoskeletal negative musculoskeletal ROS (+)   Abdominal   Peds negative pediatric ROS (+)  Hematology negative hematology ROS (+)   Anesthesia Other Findings Upper lower front caps   Reproductive/Obstetrics negative OB ROS                          Anesthesia Physical Anesthesia Plan  ASA: III  Anesthesia Plan: General   Post-op Pain Management:    Induction: Intravenous  Airway Management Planned: Oral ETT  Additional Equipment:   Intra-op Plan:   Post-operative Plan: Extubation in OR  Informed Consent: I have reviewed the patients History and Physical, chart, labs and discussed the procedure including the risks, benefits and alternatives for the proposed anesthesia with the patient or authorized representative who has indicated his/her understanding and acceptance.   Dental advisory given  Plan Discussed with: CRNA  Anesthesia Plan Comments:         Anesthesia Quick Evaluation

## 2011-12-13 NOTE — Transfer of Care (Signed)
Immediate Anesthesia Transfer of Care Note  Patient: [name]  Procedure(s) Performed: Procedure(s) (LRB): LAPAROSCOPIC RIGHT COLECTOMY (Right)  Patient Location: PACU  Anesthesia Type: General  Level of Consciousness: awake, sedated and patient cooperative  Airway & Oxygen Therapy: Patient Spontanous Breathing and Patient connected to face mask oxygen  Post-op Assessment: Report given to PACU RN and Post -op Vital signs reviewed and stable  Post vital signs: Reviewed and stable  Complications: No apparent anesthesia complications

## 2011-12-13 NOTE — Preoperative (Signed)
Beta Blockers   Reason not to administer Beta Blockers:Took metoprolol po this am.

## 2011-12-13 NOTE — Op Note (Signed)
[name] [facility] 23-Jan-1928 [id number] 11/23/2011  Preoperative diagnosis: Cancer, a sending colon  Postoperative diagnosis: Same  Procedure: Laparoscopic assisted right hemicolectomy with laparoscopic lysis of adhesions (30 minutes)  Surgeon: Currie Paris, MD, FACS  Assistant: Dr. Ovidio Kin  Anesthesia: General   Clinical History and Indications: This patient presented a few weeks ago with a marked anemia which was symptomatic. Workup showed that she had been bleeding from a carcinoma in the ascending colon. After cardiac clearance was obtained the patient is admitted for partial colectomy.    Description of Procedure: Patient was seen in the preprocedure area and the plans for the procedure reviewed again and all questions answered. The patient was taken to the operative room and after satisfactory general endotracheal anesthesia had been obtained a Foley catheter was placed in the abdomen was prepped and draped. A time out was performed.  The patient has a long right paramedian incision from a remote cholecystectomy. I made an umbilical incision identified the fascia and entered the peritoneal cavity under direct vision. There were no lesions here and I was able to place a Hasson cannula. The abdomen was insufflated to 15. The camera was placed I noticed a long line of omental adhesions to the right paramedian incision. I placed a trocar in the right upper quadrant and one in the left upper quadrant under direct vision. I did spend 30 minutes taking these adhesions down so we had adequate exposure to the abdomen. Once they were down a 5 mm trocar was placed in the right lower abdomen.  Additional occasions were taken down where the omentum was stuck to the liver from a prior cholecystectomy. Those were all freed up. The patient is in place in some Trendelenburg and some peritoneal attachments of the terminal ileum freed up to get mobilization of the terminal ileum. The  ureter was identified and avoided. I then was able to sweep the colon from medial to lateral after I opened the lateral peritoneum. Identified the duodenum and was unable to sleep the transverse colon down anteriorly from the liver and duodenum. I divided some omental adhesions and some of the omentum to free up the rest of the transverse colon at this point I had well mobilized as it would come well across the midline.  I then made a incision in the old scar about the width of my hand and put a wound protector and. The terminal ileum, ascending colon, and right transverse colon were all mobilized into the wound.A few more omental adhesions to the transverse colon were taken down and the colon mesentery divided either with the Kelly clamps and double tied.I then tacked the antimesenteric border of the colon and small bowel together and opened each. The GIA staple device was placed and fired and we had a nice anastomosis with no bleeding. A TA stapling device was used to close the common defect and the specimen amputated. The palpable cancer was noted in the location expected from the colonoscopy.  Gloves and instruments were then changed. The colon mesentery was reapproximated so there would be no mesenteric defect. There was some irrigation done with 2 L of saline to make sure everything looked dry. The midline was closed with running 0 looped PDS suture. We then reinsufflated and used a camera make a final check and everything appeared to be okay. The abdomen was deflated and the trochars removed. The skin was closed with staples.  The patient tolerated the procedure well. There no operative complications.  All surgical counts were correct. Estimated blood loss was about 150 cc. Currie Paris, MD, FACS 12/13/2011 11:55 AM

## 2011-12-14 LAB — TYPE AND SCREEN: Unit division: 0

## 2011-12-14 MED ORDER — ONDANSETRON HCL 4 MG/2ML IJ SOLN
4.0000 mg | Freq: Four times a day (QID) | INTRAMUSCULAR | Status: DC | PRN
  Administered 2011-12-14 – 2011-12-16 (×3): 4 mg via INTRAVENOUS
  Filled 2011-12-14 (×4): qty 2

## 2011-12-14 MED ORDER — DIPHENHYDRAMINE HCL 12.5 MG/5ML PO ELIX: 12.5000 mg | ORAL_SOLUTION | Freq: Four times a day (QID) | ORAL | Status: DC | PRN

## 2011-12-14 MED ORDER — SODIUM CHLORIDE 0.9 % IJ SOLN: 9.0000 mL | INTRAMUSCULAR | Status: DC | PRN

## 2011-12-14 MED ORDER — MORPHINE SULFATE (PF) 1 MG/ML IV SOLN
INTRAVENOUS | Status: DC
Start: 2011-12-14 — End: 2011-12-17
  Administered 2011-12-14: 20:00:00 via INTRAVENOUS
  Administered 2011-12-14: 3.8 mg via INTRAVENOUS
  Administered 2011-12-15: 3 mg via INTRAVENOUS
  Administered 2011-12-15: 6 mg via INTRAVENOUS
  Administered 2011-12-15 (×2): 3 mg via INTRAVENOUS
  Administered 2011-12-16: 4.5 mg via INTRAVENOUS
  Administered 2011-12-16: 18:00:00 via INTRAVENOUS
  Administered 2011-12-16 – 2011-12-17 (×3): 1.5 mg via INTRAVENOUS
  Filled 2011-12-14 (×2): qty 25

## 2011-12-14 MED ORDER — NALOXONE HCL 0.4 MG/ML IJ SOLN: 0.4000 mg | INTRAMUSCULAR | Status: DC | PRN

## 2011-12-14 MED ORDER — DIPHENHYDRAMINE HCL 50 MG/ML IJ SOLN: 12.5000 mg | Freq: Four times a day (QID) | INTRAMUSCULAR | Status: DC | PRN

## 2011-12-14 NOTE — Progress Notes (Signed)
UR complete 

## 2011-12-14 NOTE — Progress Notes (Signed)
Patient aroused from sleep state c/o abd pain not pca low-dose morphine not relieve. Dr. Ezzard Standing returned page. T.O.V. For high-dose morphine pca.

## 2011-12-14 NOTE — Progress Notes (Signed)
1 Day Post-Op  Subjective: Slept all night, then woke up with incisional pain. Wants something to drink - dry throat.  Objective: Vital signs in last 24 hours: Temp:  [97.3 F (36.3 C)-98.2 F (36.8 C)] 97.8 F (36.6 C) (06/14 0531) Pulse Rate:  [66-96] 96  (06/14 0531) Resp:  [7-20] 10  (06/14 0715) BP: (132-186)/(55-83) 139/76 mmHg (06/14 0531) SpO2:  [96 %-100 %] 98 % (06/14 0715) Weight:  [174 lb (78.926 kg)] 174 lb (78.926 kg) (06/13 1403)   Intake/Output from previous day: 06/13 0701 - 06/14 0700 In: 4736.7 [I.V.:4736.7] Out: 645 [Urine:545; Blood:100] Intake/Output this shift:     General appearance: alert, cooperative, appears stated age and no distress Resp: clear to auscultation bilaterally GI: Soft, mild incional tenderness, BS = 0  Incision: Dressing dry   Lab Results:  No results found for this basename: WBC:2,HGB:2,HCT:2,PLT:2 in the last 72 hours BMET No results found for this basename: NA:2,K:2,CL:2,CO2:2,GLUCOSE:2,BUN:2,CREATININE:2,CALCIUM:2 in the last 72 hours PT/INR No results found for this basename: LABPROT:2,INR:2 in the last 72 hours ABG No results found for this basename: PHART:2,PCO2:2,PO2:2,HCO3:2 in the last 72 hours  MEDS, Scheduled    . alvimopan  12 mg Oral Once  . alvimopan  12 mg Oral BID  . cefOXitin  1 g Intravenous 60 min Pre-Op  . dextrose 5 % and 0.45 % NaCl with KCl 20 mEq/L      . heparin      . heparin  5,000 Units Subcutaneous Q8H  . metoprolol  25 mg Oral BID  . morphine      . morphine   Intravenous Q4H  . DISCONTD: chlorhexidine  1 application Topical Once  . DISCONTD: chlorhexidine  1 application Topical Once  . DISCONTD: heparin  5,000 Units Subcutaneous Once  . DISCONTD: morphine   Intravenous Q4H    Studies/Results: No results found.  Assessment: s/p Procedure(s): LAPAROSCOPIC RIGHT COLECTOMY Stable one day post op  Plan: Will encourage ambulation. allow sips of clear liquids,encourage Inc Spir. Told  her path likely available on Monday, no gross evidence of tumor beyond colon. Her prwe op K+ was a bit low, so will check in AM   LOS: 1 day     Currie Paris, MD, [facility], [place] [phone]   12/14/2011 7:23 AM

## 2011-12-15 DIAGNOSIS — C182 Malignant neoplasm of ascending colon: Secondary | ICD-10-CM | POA: Diagnosis not present

## 2011-12-15 DIAGNOSIS — I129 Hypertensive chronic kidney disease with stage 1 through stage 4 chronic kidney disease, or unspecified chronic kidney disease: Secondary | ICD-10-CM | POA: Diagnosis not present

## 2011-12-15 DIAGNOSIS — D649 Anemia, unspecified: Secondary | ICD-10-CM | POA: Diagnosis not present

## 2011-12-15 LAB — CBC
HCT: 30.1 % — ABNORMAL LOW (ref 36.0–46.0)
Hemoglobin: 9.4 g/dL — ABNORMAL LOW (ref 12.0–15.0)
MCHC: 31.2 g/dL (ref 30.0–36.0)
RBC: 3.49 MIL/uL — ABNORMAL LOW (ref 3.87–5.11)
WBC: 12.4 10*3/uL — ABNORMAL HIGH (ref 4.0–10.5)

## 2011-12-15 LAB — BASIC METABOLIC PANEL
BUN: 7 mg/dL (ref 6–23)
Chloride: 103 mEq/L (ref 96–112)
GFR calc non Af Amer: 78 mL/min — ABNORMAL LOW (ref >90)
Glucose, Bld: 168 mg/dL — ABNORMAL HIGH (ref 70–99)
Potassium: 3.7 mEq/L (ref 3.5–5.1)
Sodium: 135 mEq/L (ref 135–145)

## 2011-12-15 MED ORDER — ACETAMINOPHEN 325 MG PO TABS
650.0000 mg | ORAL_TABLET | Freq: Four times a day (QID) | ORAL | Status: DC | PRN
  Administered 2011-12-16 – 2011-12-17 (×2): 650 mg via ORAL
  Filled 2011-12-15 (×3): qty 2

## 2011-12-15 NOTE — Progress Notes (Signed)
Patient ID: [name], female   DOB: 1927-11-07, 78 y.o.   MRN: [id number] [facility] Progress Note:   2 Days Post-Op  Subjective: Mental status is clear Objective: Vital signs in last 24 hours: Temp:  [97.5 F (36.4 C)-98.1 F (36.7 C)] 97.8 F (36.6 C) (06/15 0601) Pulse Rate:  [84-101] 97  (06/15 0601) Resp:  [11-21] 21  (06/15 0601) BP: (115-138)/(56-82) 136/77 mmHg (06/15 0601) SpO2:  [33 %-100 %] 98 % (06/15 0601) FiO2 (%):  [100 %] 100 % (06/15 0000)  Intake/Output from previous day: 06/14 0701 - 06/15 0700 In: 2670 [P.O.:240; I.V.:2430] Out: 2125 [Urine:2125] Intake/Output this shift:    Physical Exam: Work of breathing is  OK.  No complaints.  Dressings dry.  No flatus.    Lab Results:  Results for orders placed during the hospital encounter of 12/13/11 (from the past 48 hour(s))  CBC     Status: Abnormal   Collection Time   12/15/11  4:44 AM      Component Value Range Comment   WBC 12.4 (*) 4.0 - 10.5 K/uL    RBC 3.49 (*) 3.87 - 5.11 MIL/uL    Hemoglobin 9.4 (*) 12.0 - 15.0 g/dL    HCT 46.9 (*) 62.9 - 46.0 %    MCV 86.2  78.0 - 100.0 fL    MCH 26.9  26.0 - 34.0 pg    MCHC 31.2  30.0 - 36.0 g/dL    RDW 52.8 (*) 41.3 - 15.5 %    Platelets 178  150 - 400 K/uL   BASIC METABOLIC PANEL     Status: Abnormal   Collection Time   12/15/11  4:44 AM      Component Value Range Comment   Sodium 135  135 - 145 mEq/L    Potassium 3.7  3.5 - 5.1 mEq/L    Chloride 103  96 - 112 mEq/L    CO2 25  19 - 32 mEq/L    Glucose, Bld 168 (*) 70 - 99 mg/dL    BUN 7  6 - 23 mg/dL    Creatinine, Ser 2.44  0.50 - 1.10 mg/dL    Calcium 8.4  8.4 - 01.0 mg/dL    GFR calc non Af Amer 78 (*) >90 mL/min    GFR calc Af Amer >90  >90 mL/min     Radiology/Results: No results found.  Anti-infectives: Anti-infectives     Start     Dose/Rate Route Frequency Ordered Stop   12/13/11 0732   cefOXitin (MEFOXIN) 1 g in dextrose 5 % 50 mL IVPB        1 g 100 mL/hr over 30  Minutes Intravenous 60 min pre-op 12/13/11 0732 12/13/11 0955          Assessment/Plan: Problem List: Patient Active Problem List  Diagnosis  . HYPERLIPIDEMIA  . HYPERTENSION  . CORONARY ARTERY DISEASE  . DIVERTICULOSIS-COLON  . PERSONAL HX COLONIC POLYPS  . Iron deficiency anemia secondary to blood loss (chronic)  . Carcinoma of ascending colon    Taking liquids but no flatus yet..  Tylenol for pain.  Get up and walk with assistance.  2 Days Post-Op    LOS: 2 days   Matt B. Daphine Deutscher, MD, [facility] [phone] beeper [phone]  12/15/2011 8:57 AM

## 2011-12-16 MED ORDER — CALCIUM CARBONATE ANTACID 500 MG PO CHEW
1.0000 | CHEWABLE_TABLET | ORAL | Status: DC | PRN
  Administered 2011-12-16 – 2011-12-17 (×2): 200 mg via ORAL
  Filled 2011-12-16 (×2): qty 1

## 2011-12-16 NOTE — Progress Notes (Signed)
Pt taking tylenol for pain not using PCA. Annitta Needs, RN

## 2011-12-16 NOTE — Progress Notes (Signed)
Patient ID: [name], female   DOB: 1928-03-14, 78 y.o.   MRN: [id number] [facility] Progress Note:   3 Days Post-Op  Subjective: Mental status is clear.  Reports flatus and BM Objective: Vital signs in last 24 hours: Temp:  [98 F (36.7 C)-98.8 F (37.1 C)] 98.7 F (37.1 C) (06/16 0545) Pulse Rate:  [89-106] 90  (06/16 0545) Resp:  [16-19] 18  (06/16 0803) BP: (130-138)/(70-77) 131/70 mmHg (06/16 0545) SpO2:  [98 %-100 %] 100 % (06/16 0803)  Intake/Output from previous day: 06/15 0701 - 06/16 0700 In: 3175 [P.O.:840; I.V.:2335] Out: 1550 [Urine:1550] Intake/Output this shift: Total I/O In: -  Out: 300 [Urine:300]  Physical Exam: Work of breathing is  Normal.  Incisions are clean with staples  Lab Results:  Results for orders placed during the hospital encounter of 12/13/11 (from the past 48 hour(s))  CBC     Status: Abnormal   Collection Time   12/15/11  4:44 AM      Component Value Range Comment   WBC 12.4 (*) 4.0 - 10.5 K/uL    RBC 3.49 (*) 3.87 - 5.11 MIL/uL    Hemoglobin 9.4 (*) 12.0 - 15.0 g/dL    HCT 40.9 (*) 81.1 - 46.0 %    MCV 86.2  78.0 - 100.0 fL    MCH 26.9  26.0 - 34.0 pg    MCHC 31.2  30.0 - 36.0 g/dL    RDW 91.4 (*) 78.2 - 15.5 %    Platelets 178  150 - 400 K/uL   BASIC METABOLIC PANEL     Status: Abnormal   Collection Time   12/15/11  4:44 AM      Component Value Range Comment   Sodium 135  135 - 145 mEq/L    Potassium 3.7  3.5 - 5.1 mEq/L    Chloride 103  96 - 112 mEq/L    CO2 25  19 - 32 mEq/L    Glucose, Bld 168 (*) 70 - 99 mg/dL    BUN 7  6 - 23 mg/dL    Creatinine, Ser 9.56  0.50 - 1.10 mg/dL    Calcium 8.4  8.4 - 21.3 mg/dL    GFR calc non Af Amer 78 (*) >90 mL/min    GFR calc Af Amer >90  >90 mL/min     Radiology/Results: No results found.  Anti-infectives: Anti-infectives     Start     Dose/Rate Route Frequency Ordered Stop   12/13/11 0732   cefOXitin (MEFOXIN) 1 g in dextrose 5 % 50 mL IVPB        1 g 100  mL/hr over 30 Minutes Intravenous 60 min pre-op 12/13/11 0732 12/13/11 0955          Assessment/Plan: Problem List: Patient Active Problem List  Diagnosis  . HYPERLIPIDEMIA  . HYPERTENSION  . CORONARY ARTERY DISEASE  . DIVERTICULOSIS-COLON  . PERSONAL HX COLONIC POLYPS  . Iron deficiency anemia secondary to blood loss (chronic)  . Carcinoma of ascending colon    Will advance to full liquid diet.  Wants to go home Monday or Tuesday.  3 Days Post-Op    LOS: 3 days   Matt B. Daphine Deutscher, MD, [facility] [phone] beeper [phone]  12/16/2011 9:50 AM

## 2011-12-17 MED ORDER — HYDROCODONE-ACETAMINOPHEN 5-325 MG PO TABS: 1.0000 | ORAL_TABLET | ORAL | Status: DC | PRN

## 2011-12-17 MED ORDER — PROMETHAZINE HCL 25 MG RE SUPP
12.5000 mg | Freq: Four times a day (QID) | RECTAL | Status: DC | PRN
  Administered 2011-12-17: 12.5 mg via RECTAL
  Filled 2011-12-17: qty 1

## 2011-12-17 NOTE — Progress Notes (Signed)
Patient up to bathroom, iv site leaking and patient complains of slight tenderness at site.  Discontinued iv site. Gauze dressing applied.  Notified Dr Jamey Ripa.  Order given to leave IV out and Dr. Jamey Ripa in to see patient to evaluate.

## 2011-12-17 NOTE — Progress Notes (Signed)
  4 Days Post-Op  Subjective: Feels OK and wants to go home. Tolerating FL, but not a lot PO yet. Having some loose stools as well. Minimal pain  Objective: Vital signs in last 24 hours: Temp:  [97.9 F (36.6 C)-99 F (37.2 C)] 99 F (37.2 C) (06/16 2309) Pulse Rate:  [82-93] 93  (06/16 2309) Resp:  [14-19] 18  (06/17 0400) BP: (124-137)/(56-76) 135/62 mmHg (06/16 2309) SpO2:  [98 %-100 %] 98 % (06/17 0400)   Intake/Output from previous day: 06/16 0701 - 06/17 0700 In: 1806.7 [P.O.:240; I.V.:1566.7] Out: 525 [Urine:525] Intake/Output this shift: Total I/O In: 700 [I.V.:700] Out: -    General appearance: alert, cooperative and no distress Resp: clear to auscultation bilaterally GI: Soft, not tender BS+  Incision: healing well  Lab Results:   Basename 12/15/11 0444  WBC 12.4*  HGB 9.4*  HCT 30.1*  PLT 178   BMET  Basename 12/15/11 0444  NA 135  K 3.7  CL 103  CO2 25  GLUCOSE 168*  BUN 7  CREATININE 0.71  CALCIUM 8.4   PT/INR No results found for this basename: LABPROT:2,INR:2 in the last 72 hours ABG No results found for this basename: PHART:2,PCO2:2,PO2:2,HCO3:2 in the last 72 hours  MEDS, Scheduled    . heparin  5,000 Units Subcutaneous Q8H  . metoprolol  25 mg Oral BID  . morphine   Intravenous Q4H  . DISCONTD: alvimopan  12 mg Oral BID    Studies/Results: No results found.  Assessment: s/p Procedure(s): LAPAROSCOPIC RIGHT COLECTOMY Progressing as expected  Plan: Advance diet May be able to discahrge later today if diet tolerated but will need to be sure that is the case   LOS: 4 days     Currie Paris, MD, [facility], [place] [phone]   12/17/2011 6:33 AM

## 2011-12-18 MED ORDER — HYDROCODONE-ACETAMINOPHEN 5-325 MG PO TABS: 1.0000 | ORAL_TABLET | ORAL | Status: AC | PRN

## 2011-12-18 NOTE — Discharge Instructions (Signed)
Cancer of the Colon, Treatment by Resection You and your caregiver have decided that surgical removal of your colon cancer is the best form of treatment for you. Your surgeon or surgeons will do their best to remove your entire tumor. To do this, some normal tissue must also be removed to give you the best chance for a cure. The following will help describe what happens when you have this surgery. TREATMENT  Surgery is the most common treatment for colorectal cancer. It is a type of local therapy. It treats the cancer in the colon or rectum and the area close to the tumor by removing the tumor and some of the healthy tissue around it. For larger cancers, your surgeon must make an cut (incision) into the belly (abdomen) so he or she can see the area of the tumor and remove it as well as part of the healthy colon or rectum. Some nearby lymph nodes also may be removed. The surgeon checks the rest of the abdomen, the intestine and the liver to see if the cancer has spread. When a section of the colon or rectum is removed, the surgeon can usually reconnect the healthy parts. However, sometimes reconnection is not possible. In this case, the surgeon creates a new path for waste to leave the body. The surgeon makes an opening (a stoma) in the wall of the abdomen. The upper end of the intestine is then connected to the stoma. The other end is closed. The operation to create the stoma is called a colostomy. A flat bag fits over the stoma to collect waste, and a special adhesive holds it in place.  Some colostomies are temporary. The colostomy is needed only until the colon or rectum heals from surgery. After healing takes place, the surgeon reconnects the parts of the intestine and closes the stoma. Other patients need a permanent colostomy.  ASK YOUR CAREGIVER THESE QUESTIONS BEFORE HAVING SURGERY:  What kind of operation do you recommend for me?   Do I need any lymph nodes removed? Will other tissues be removed?  Why?   What are the risks of surgery? Will I have any lasting side effects?   Will I need a colostomy? If so, will it be permanent?   How will I feel after the operation?   If I have pain, how will it be controlled?   How long will I be in the hospital?   When can I get back to my normal activities?  FOLLOW-UP CARE  Follow-up care after treatment for colorectal cancer is important. Even when the cancer seems to have been completely removed or destroyed, the disease sometimes returns. Undetected cancer cells may still remain somewhere in the body after treatment. The doctor keeps checking the person's recovery and checks for recurrence of the cancer. Recurrence means that the cancer comes back.  Checkups help make sure that changes in health are found. Checkups may include:  A physical exam (including a digital rectal exam). This means your caregiver checks you to see if there are any abnormal changes they can see or feel.   Lab tests (including fecal occult blood test and CEA test) may be done. The "fecal occult blood test" checks for blood in the stool. The CEA (carcinoembryonic antigen) is a blood test that looks for a marker of colon cancer in the blood.   A colonoscopy is a test where your caregiver examines your colon with a flexible instrument like a thin telescope which looks at the inside   of the large bowel.   Other specialized x-rays, CT scans, or other tests may be performed.  Between scheduled visits you should contact your caregivers as soon as any health problems appear. Document Released: 06/21/2003 Document Revised: 06/07/2011 Document Reviewed: 10/14/2007 Exit[employer] Patient Information 2012 [employer]. 

## 2011-12-18 NOTE — Progress Notes (Signed)
Notified MD concerning patients incision site open a little about .5 cm, orders received to reinforce the incision with steri-strips and patient could go home, patient alert and oriented, vital signs are stable, discharge instructions reviewed with patient, pt to follow up with MD, questions and concerns answered Means, Myrtie Hawk RN 12-18-11 17:47pm

## 2011-12-18 NOTE — Discharge Summary (Signed)
Physician Discharge Summary  Patient ID: [name] MRN: [id number] DOB/AGE: 76-Jun-1929 76 y.o.  Admit date: 12/13/2011 Discharge date: 12/18/2011  Admission Diagnoses:  Cancer of the colon  Discharge Diagnoses:  Cancer of the right colon; T2N0  Principal Problem:  *Carcinoma of ascending colon   Surgery:  Right hemicolectomy  Discharged Condition: good  Hospital Course:   Had surgery and did well.  Started on diet and advanced.    Consults: none  Significant Diagnostic Studies: path    Discharge Exam: Blood pressure 137/56, pulse 102, temperature 99 F (37.2 C), temperature source Oral, resp. rate 18, height 5' (1.524 m), weight 174 lb (78.926 kg), SpO2 96.00%. Incisions good.  Will remove staples.  nontender  Disposition: 01-Home or Self Care  Discharge Orders    Future Appointments: Provider: Department: Dept Phone: Center:   01/01/2012 1:50 PM Currie Paris, MD [facility] [phone] None     Future Orders Please Complete By Expires   Diet - low sodium heart healthy      Increase activity slowly      Discharge instructions      Comments:   May shower and be active   No dressing needed        Medication List  As of 12/18/2011  1:55 PM   TAKE these medications         acetaminophen 325 MG tablet   Commonly known as: TYLENOL   Take 650 mg by mouth every 6 (six) hours as needed. For pain      albuterol 108 (90 BASE) MCG/ACT inhaler   Commonly known as: PROVENTIL HFA;VENTOLIN HFA   Inhale 2 puffs into the lungs every 4 (four) hours as needed. For shortness of breath      CALCIUM + D PO   Take 1 tablet by mouth 2 (two) times daily. Calcium 500mg  with Vitamin D      cetirizine 10 MG tablet   Commonly known as: ZYRTEC   Take 10 mg by mouth daily with breakfast.      ferrous sulfate 325 (65 FE) MG EC tablet   Take 162.5 mg by mouth daily with breakfast.      HYDROcodone-acetaminophen 5-325 MG per tablet   Commonly known as: NORCO   Take 1  tablet by mouth every 4 (four) hours as needed.      lovastatin 20 MG 24 hr tablet   Commonly known as: ALTOPREV   Take 20 mg by mouth at bedtime.      metoprolol 50 MG tablet   Commonly known as: LOPRESSOR   Take 25 mg by mouth 2 (two) times daily.      triamterene-hydrochlorothiazide 37.5-25 MG per capsule   Commonly known as: DYAZIDE   Take 1 capsule by mouth every morning.           Follow-up Information    Follow up with Currie Paris, MD on 01/01/2012.   Contact information:   [facility] [address] [place] [address] [phone]          Signed: Valarie Merino 12/18/2011, 1:55 PM

## 2012-01-01 ENCOUNTER — Ambulatory Visit (INDEPENDENT_AMBULATORY_CARE_PROVIDER_SITE_OTHER): Payer: Medicare Other | Admitting: Surgery

## 2012-01-01 ENCOUNTER — Encounter (INDEPENDENT_AMBULATORY_CARE_PROVIDER_SITE_OTHER): Payer: Self-pay | Admitting: Surgery

## 2012-01-01 VITALS — BP 128/72 | HR 68 | Temp 97.1°F | Resp 14 | Ht 60.0 in | Wt 166.5 lb

## 2012-01-01 DIAGNOSIS — C182 Malignant neoplasm of ascending colon: Secondary | ICD-10-CM

## 2012-01-01 NOTE — Addendum Note (Signed)
Addended byLiliana Cline on: 01/01/2012 02:05 PM   Modules accepted: Orders

## 2012-01-01 NOTE — Patient Instructions (Signed)
See me again in about 3 or 4 weeks. We will make arrangements for a medical oncology consultation. They should call you in a few days for an appointment.Let us know if you do not hear from them.

## 2012-01-01 NOTE — Progress Notes (Signed)
NAME: [name]                                            DOB: [birth year] DATE: 01/01/2012                                                  MRN: [id number]  CC: Post op   HPI: This patient comes in for post op follow-up.Sheunderwent Laparoscopically assisted right hemicolectomy on 12/13/2011. She feels that she is doing well.She is eating normally, having fairly normal bowel movements, minimal pain. No nausea or vomiting. No urinary tract symptoms. PE:  VITAL SIGNS: BP 128/72  Pulse 68  Temp 97.1 F (36.2 C) (Temporal)  Resp 14  Ht 5' (1.524 m)  Wt 166 lb 8 oz (75.524 kg)  BMI 32.52 kg/m2  General: The patient appears to be healthy, NAD Abdomen soft and benign. The wound is well-healed. There are no masses. She is minimally tender.  DATA REVIEWED: The pathology report as noted showing a 4 cm invasive adenocarcinoma with negative lymph nodes  IMPRESSION: The patient is doing well S/P Right colectomy.    PLAN: I will see her back here in 3 weeks. I reviewed the pathology report with her and gave her a copy. Will put in a consultation for medical oncology.

## 2012-01-02 ENCOUNTER — Telehealth: Payer: Self-pay | Admitting: Oncology

## 2012-01-02 NOTE — Telephone Encounter (Signed)
S/w the pt and she is aware of her new pt appt with dr Truett Perna on 01/11/2012

## 2012-01-07 DIAGNOSIS — D649 Anemia, unspecified: Secondary | ICD-10-CM | POA: Diagnosis not present

## 2012-01-07 DIAGNOSIS — Z79899 Other long term (current) drug therapy: Secondary | ICD-10-CM | POA: Diagnosis not present

## 2012-01-07 DIAGNOSIS — I10 Essential (primary) hypertension: Secondary | ICD-10-CM | POA: Diagnosis not present

## 2012-01-07 DIAGNOSIS — E7800 Pure hypercholesterolemia, unspecified: Secondary | ICD-10-CM | POA: Diagnosis not present

## 2012-01-11 ENCOUNTER — Ambulatory Visit (HOSPITAL_BASED_OUTPATIENT_CLINIC_OR_DEPARTMENT_OTHER): Payer: Medicare Other | Admitting: Oncology

## 2012-01-11 ENCOUNTER — Other Ambulatory Visit: Payer: Medicare Other | Admitting: Lab

## 2012-01-11 ENCOUNTER — Ambulatory Visit: Payer: Medicare Other

## 2012-01-11 ENCOUNTER — Encounter: Payer: Self-pay | Admitting: Oncology

## 2012-01-11 VITALS — BP 121/74 | HR 97 | Temp 97.4°F | Ht 59.5 in | Wt 164.8 lb

## 2012-01-11 DIAGNOSIS — C189 Malignant neoplasm of colon, unspecified: Secondary | ICD-10-CM

## 2012-01-11 NOTE — Progress Notes (Signed)
Patient came in today as a new patient and she has two insurance,she said that she is [place] as far as [employer] investment [employer].

## 2012-01-11 NOTE — Progress Notes (Signed)
[facility] New Patient Consult   Referring MD: Shalae Belmonte y.o.  11/08/1927    Reason for Referral: Colon cancer     HPI: She reports progressive exertional dyspnea beginning earlier this year. She was evaluated by her primary physician and found to have a hemoglobin of 7.9. She was referred to the emergency room for hospital admission. On Nov 14 2011 hemoglobin returned at 7.9 with an MCV of 78. Dr. Leone Payor was consulted and she was taken to a colonoscopy procedure on Nov 16 2011. A mass was found in the a sending colon. The mass was several centimeters distal to the cecum. Multiple biopsies were obtained. Severe diverticulosis was found in the sigmoid colon with scattered diverticula in the right colon. The exam was otherwise normal. The biopsy confirmed invasive adenocarcinoma.  She was referred to Dr. Jamey Ripa. A CT of the abdomen and pelvis on may 23rd 2013 revealed a 4 mm nodule in the right lower lung that was unchanged compared to a study from June of 2011. No other suspicious appearing pulmonary nodules or masses. A stable hepatic cyst was noted. No other definite hepatic lesions were noted to suggest the presence of hepatic metastases. A soft tissue lesion was associated with the medial wall of the descending colon above the cecum. No definite associated adenopathy. No ascites. No pathologic adenopathy within the abdomen or pelvis.  She was taken the tapping room on 12/13/2011 and underwent a laparoscopic-assisted right hemicolectomy. The tumor was palpated in the expected location from the colonoscopy.  The pathology ([id number]) confirmed an invasive moderately differentiated adenocarcinoma with abundant extracellular mucin spanning 4 cm. Adenocarcinoma extended into but not through the muscularis propria. Lymphovascular invasion was identified. The surgical margins were negative for carcinoma. No evidence of carcinoma was noted in 9 of 9 lymph  nodes.  She is referred to consider adjuvant treatment options.  Past Medical History  Diagnosis Date  . Arthritis   . Asthma   .  right sided colon cancer (T2 N0)  MAY 2013    HOSPITALIZED AT [facility] WITH HGB 7 -TRANSFUSED-MASS FOUND IN ASCENDING COLON  . Diverticulosis   . HLD (hyperlipidemia)   . Obesity   . Internal hemorrhoids   . Osteoporosis   . Anemia-iron deficiency   may 2013   . Blood transfusion MAY 2013  .  G0 P0    . Heart attack 01/01/2001    AUG 2002 HEART STENT PLACEMENT  . Hypertension   . Complication of anesthesia 2009    HIP REPLACEMENT-PT HAD HARD TIME WAKING UP--FELT LIKE SHE COULDN'T BREATHE  . CAD (coronary artery disease)     "SINGLE VESSEL CORONARY DISEASE WITH CX-OM-2 STENTING IN 2002 AND CARDIAC BALLOON ATHERECTOMY FOR IN-STENT RESTENOSIS IN 2003 ICH WAS WIDELY PATENT ON CATHETERIZATION IN 2004 " - PER CARDIOLOGY OFFICE NOTE 11/23/11 FROM DR. Alanda Amass    Past Surgical History  Procedure Date  . Appendectomy  age 89   . Cholecystectomy  age 28   . Coronary angioplasty with stent placement   . Colonoscopy w/ biopsies and polypectomy 06/15/2008    adenomatous polyps, diverticulosis, internal hemorrhoids  . Total hip arthroplasty 09/08/2007    left, Dr. Lequita Halt  . Colonoscopy 11/16/2011    Procedure: COLONOSCOPY;  Surgeon: Iva Boop, MD;  Location: [facility] ENDOSCOPY;  Service: Endoscopy;  Laterality: N/A;  . Knee replacement  2003     left  . Joint replacement     RT  HIP 2003  L KNEE 2003  LEFT HIP 2009     family history: She had 3 brothers and 2 sisters. Her father had "cancer "and died at age 24 she is not sure of the primary tumor site. One brother had prostate cancer and she believes one brother had colon or rectal cancer.   Current outpatient prescriptions:acetaminophen (TYLENOL) 325 MG tablet, Take 650 mg by mouth every 6 (six) hours as needed. For pain, Disp: , Rfl: ;  albuterol (PROVENTIL HFA;VENTOLIN HFA) 108 (90 BASE) MCG/ACT inhaler, Inhale  2 puffs into the lungs every 4 (four) hours as needed. For shortness of breath, Disp: , Rfl: ;  Calcium Carbonate-Vitamin D (CALCIUM + D PO), Take 1 tablet by mouth daily. Calcium 500mg  with Vitamin D, Disp: , Rfl:  cetirizine (ZYRTEC) 10 MG tablet, Take 5 mg by mouth 2 (two) times daily. , Disp: , Rfl: ;  ferrous sulfate 325 (65 FE) MG EC tablet, Take 162.5 mg by mouth daily with breakfast. , Disp: , Rfl: ;  HYDROcodone-acetaminophen (NORCO) 5-325 MG per tablet, Take 1 tablet by mouth every 6 (six) hours as needed., Disp: , Rfl: ;  lovastatin (ALTOPREV) 20 MG 24 hr tablet, Take 20 mg by mouth at bedtime., Disp: , Rfl:  metoprolol (LOPRESSOR) 50 MG tablet, Take 25 mg by mouth 2 (two) times daily. , Disp: , Rfl: ;  triamterene-hydrochlorothiazide (DYAZIDE) 37.5-25 MG per capsule, Take 1 capsule by mouth every morning., Disp: , Rfl:   Allergies:  Allergies  Allergen Reactions  . Atorvastatin     REACTION: shortness of breath, cramping in back  . Clopidogrel Bisulfate     REACTION: shortness of breath, cramping in back  . Codeine     REACTION: vomiting  . Sulfonamide Derivatives     REACTION: intestinal upset    Social History: She lives with her husband and Michie. She previously worked in an office occupation. She quit smoking cigarettes in 1984. She does not use alcohol. She received a transfusion with a hip replacement 2003. No risk factors for HIV or hepatitis.   ROS:   Positives include: 16 pound weight loss surrounding surgery, "dizzy "when getting up from a lying position   A complete ROS was otherwise negative.  Physical Exam:  Blood pressure 121/74, pulse 97, temperature 97.4 F (36.3 C), temperature source Oral, height 4' 11.5" (1.511 m), weight 164 lb 12.8 oz (74.753 kg).  HEENT: Oropharynx without visible mass, neck without mass  Lungs:  clear bilaterally Cardiac:  regular rate and rhythm  Abdomen:  healed surgical incisions, no mass, no hepatosplenomegaly  Vascular:   no leg edema Lymph nodes:  no cervical, supraclavicular, axillary, or inguinal nodes  Neurologic: Alert and oriented, the motor exam appears intact in the upper and lower Jevity's  Skin:  no rash    LAB:  CBC  Lab Results  Component Value Date   WBC 12.4* 12/15/2011   HGB 9.4* 12/15/2011   HCT 30.1* 12/15/2011   MCV 86.2 12/15/2011   PLT 178 12/15/2011     CMP      Component Value Date/Time   NA 135 12/15/2011 0444   K 3.7 12/15/2011 0444   CL 103 12/15/2011 0444   CO2 25 12/15/2011 0444   GLUCOSE 168* 12/15/2011 0444   BUN 7 12/15/2011 0444   CREATININE 0.71 12/15/2011 0444   CALCIUM 8.4 12/15/2011 0444   PROT 7.2 12/06/2011 1505   ALBUMIN 4.0 12/06/2011 1505   AST 25 12/06/2011 1505  ALT 25 12/06/2011 1505   ALKPHOS 51 12/06/2011 1505   BILITOT 0.6 12/06/2011 1505   GFRNONAA 78* 12/15/2011 0444   GFRAA >90 12/15/2011 0444    CEA 2.1 on may 22nd 2013   Radiology: as per history of present illness     Assessment/Plan:   1. Adenocarcinoma of the right colon, stage I (T2 N0), status post a right hemicolectomy on 12/13/2011  2. Iron deficiency anemia secondary to the right colon mass  3. History of colon polyps  4. History of coronary artery disease   Disposition: Ms. Derrell Lolling underwent a right hemicolectomy for treatment of a stage I colon cancer. I discussed the diagnosis, prognosis, and adjuvant treatment options with her today. We reviewed the details of the surgical pathology report. She has an excellent prognosis for a long-term disease-free survival. The limited number of recovered lymph nodes and the microscopic lymphovascular invasion were the only adverse prognostic features associated with her tumor. I do not recommend adjuvant chemotherapy.  She will continue followup with Dr. Pete Glatter for management of the iron deficiency anemia. She should continue surveillance colonoscopies with Dr. Leone Payor.  We discussed the NSABP P5 prevention study. She will not be a candidate for  this study since she is already taking lovastatin.  We did not schedule a followup appointment at the [facility]. We will be glad to see her in the future as needed.    Pelagia Iacobucci 01/11/2012, 4:59 PM

## 2012-01-23 DIAGNOSIS — M75120 Complete rotator cuff tear or rupture of unspecified shoulder, not specified as traumatic: Secondary | ICD-10-CM | POA: Diagnosis not present

## 2012-01-29 ENCOUNTER — Telehealth: Payer: Self-pay | Admitting: Internal Medicine

## 2012-01-29 ENCOUNTER — Ambulatory Visit (INDEPENDENT_AMBULATORY_CARE_PROVIDER_SITE_OTHER): Payer: Medicare Other | Admitting: Surgery

## 2012-01-29 ENCOUNTER — Encounter (INDEPENDENT_AMBULATORY_CARE_PROVIDER_SITE_OTHER): Payer: Self-pay | Admitting: Surgery

## 2012-01-29 VITALS — BP 110/70 | HR 80 | Temp 98.2°F | Resp 16 | Ht 60.0 in | Wt 165.0 lb

## 2012-01-29 DIAGNOSIS — Z09 Encounter for follow-up examination after completed treatment for conditions other than malignant neoplasm: Secondary | ICD-10-CM

## 2012-01-29 NOTE — Progress Notes (Signed)
NAME: [name]                                            DOB: 1927/07/06 DATE: 01/29/2012                                                  MRN: [id number]  CC: Post op   HPI: This patient comes in for post op follow-up.Sheunderwent Laparoscopically assisted right hemicolectomy on 12/13/2011. She feels that she is doing well.She is eating normally, having fairly normal bowel movements, minimal pain. No nausea or vomiting. No urinary tract symptoms. PE:  VITAL SIGNS: BP 110/70  Pulse 80  Temp 98.2 F (36.8 C) (Temporal)  Resp 16  Ht 5' (1.524 m)  Wt 165 lb (74.844 kg)  BMI 32.22 kg/m2  General: The patient appears to be healthy, NAD Abdomen soft and benign. The wound is well-healed. There are no masses. She is minimally tender.  DATA REVIEWED: The oncologist has told her no additional therapy is needed  IMPRESSION: The patient is doing well S/P Right colectomy.  PLAN: She can have all normal activities most of which she is already doing. We will see her back here on an as-needed basis. She already knows to followup with her gastroenterologist next year

## 2012-01-29 NOTE — Telephone Encounter (Signed)
Patient is scheduled for recall colon for 10/2012.  She is advised

## 2012-01-29 NOTE — Patient Instructions (Signed)
We will see you again on an as needed basis. Please call the office at [phone] if you have any questions or concerns. Thank you for allowing us to take care of you.  

## 2012-02-01 DIAGNOSIS — H60399 Other infective otitis externa, unspecified ear: Secondary | ICD-10-CM | POA: Diagnosis not present

## 2012-02-06 DIAGNOSIS — H6120 Impacted cerumen, unspecified ear: Secondary | ICD-10-CM | POA: Diagnosis not present

## 2012-03-27 DIAGNOSIS — Z23 Encounter for immunization: Secondary | ICD-10-CM | POA: Diagnosis not present

## 2012-03-31 DIAGNOSIS — M75120 Complete rotator cuff tear or rupture of unspecified shoulder, not specified as traumatic: Secondary | ICD-10-CM | POA: Diagnosis not present

## 2012-04-03 DIAGNOSIS — I129 Hypertensive chronic kidney disease with stage 1 through stage 4 chronic kidney disease, or unspecified chronic kidney disease: Secondary | ICD-10-CM | POA: Diagnosis not present

## 2012-04-03 DIAGNOSIS — L659 Nonscarring hair loss, unspecified: Secondary | ICD-10-CM | POA: Diagnosis not present

## 2012-05-14 DIAGNOSIS — M62838 Other muscle spasm: Secondary | ICD-10-CM | POA: Diagnosis not present

## 2012-05-14 DIAGNOSIS — M5380 Other specified dorsopathies, site unspecified: Secondary | ICD-10-CM | POA: Diagnosis not present

## 2012-05-14 DIAGNOSIS — M999 Biomechanical lesion, unspecified: Secondary | ICD-10-CM | POA: Diagnosis not present

## 2012-05-14 DIAGNOSIS — M9981 Other biomechanical lesions of cervical region: Secondary | ICD-10-CM | POA: Diagnosis not present

## 2012-05-15 DIAGNOSIS — M5380 Other specified dorsopathies, site unspecified: Secondary | ICD-10-CM | POA: Diagnosis not present

## 2012-05-15 DIAGNOSIS — M999 Biomechanical lesion, unspecified: Secondary | ICD-10-CM | POA: Diagnosis not present

## 2012-05-15 DIAGNOSIS — M62838 Other muscle spasm: Secondary | ICD-10-CM | POA: Diagnosis not present

## 2012-05-15 DIAGNOSIS — M9981 Other biomechanical lesions of cervical region: Secondary | ICD-10-CM | POA: Diagnosis not present

## 2012-05-16 DIAGNOSIS — M999 Biomechanical lesion, unspecified: Secondary | ICD-10-CM | POA: Diagnosis not present

## 2012-05-16 DIAGNOSIS — M62838 Other muscle spasm: Secondary | ICD-10-CM | POA: Diagnosis not present

## 2012-05-16 DIAGNOSIS — M9981 Other biomechanical lesions of cervical region: Secondary | ICD-10-CM | POA: Diagnosis not present

## 2012-05-16 DIAGNOSIS — M5380 Other specified dorsopathies, site unspecified: Secondary | ICD-10-CM | POA: Diagnosis not present

## 2012-05-19 DIAGNOSIS — M9981 Other biomechanical lesions of cervical region: Secondary | ICD-10-CM | POA: Diagnosis not present

## 2012-05-19 DIAGNOSIS — M62838 Other muscle spasm: Secondary | ICD-10-CM | POA: Diagnosis not present

## 2012-05-19 DIAGNOSIS — M5380 Other specified dorsopathies, site unspecified: Secondary | ICD-10-CM | POA: Diagnosis not present

## 2012-05-19 DIAGNOSIS — M999 Biomechanical lesion, unspecified: Secondary | ICD-10-CM | POA: Diagnosis not present

## 2012-05-20 DIAGNOSIS — M999 Biomechanical lesion, unspecified: Secondary | ICD-10-CM | POA: Diagnosis not present

## 2012-05-20 DIAGNOSIS — M5380 Other specified dorsopathies, site unspecified: Secondary | ICD-10-CM | POA: Diagnosis not present

## 2012-05-20 DIAGNOSIS — M62838 Other muscle spasm: Secondary | ICD-10-CM | POA: Diagnosis not present

## 2012-05-20 DIAGNOSIS — M9981 Other biomechanical lesions of cervical region: Secondary | ICD-10-CM | POA: Diagnosis not present

## 2012-05-22 DIAGNOSIS — M999 Biomechanical lesion, unspecified: Secondary | ICD-10-CM | POA: Diagnosis not present

## 2012-05-22 DIAGNOSIS — M5380 Other specified dorsopathies, site unspecified: Secondary | ICD-10-CM | POA: Diagnosis not present

## 2012-05-22 DIAGNOSIS — M62838 Other muscle spasm: Secondary | ICD-10-CM | POA: Diagnosis not present

## 2012-05-22 DIAGNOSIS — M9981 Other biomechanical lesions of cervical region: Secondary | ICD-10-CM | POA: Diagnosis not present

## 2012-05-26 DIAGNOSIS — M999 Biomechanical lesion, unspecified: Secondary | ICD-10-CM | POA: Diagnosis not present

## 2012-05-26 DIAGNOSIS — M62838 Other muscle spasm: Secondary | ICD-10-CM | POA: Diagnosis not present

## 2012-05-26 DIAGNOSIS — M9981 Other biomechanical lesions of cervical region: Secondary | ICD-10-CM | POA: Diagnosis not present

## 2012-05-26 DIAGNOSIS — M5380 Other specified dorsopathies, site unspecified: Secondary | ICD-10-CM | POA: Diagnosis not present

## 2012-05-28 DIAGNOSIS — M62838 Other muscle spasm: Secondary | ICD-10-CM | POA: Diagnosis not present

## 2012-05-28 DIAGNOSIS — M999 Biomechanical lesion, unspecified: Secondary | ICD-10-CM | POA: Diagnosis not present

## 2012-05-28 DIAGNOSIS — M9981 Other biomechanical lesions of cervical region: Secondary | ICD-10-CM | POA: Diagnosis not present

## 2012-05-28 DIAGNOSIS — M5380 Other specified dorsopathies, site unspecified: Secondary | ICD-10-CM | POA: Diagnosis not present

## 2012-06-02 DIAGNOSIS — M999 Biomechanical lesion, unspecified: Secondary | ICD-10-CM | POA: Diagnosis not present

## 2012-06-02 DIAGNOSIS — M5380 Other specified dorsopathies, site unspecified: Secondary | ICD-10-CM | POA: Diagnosis not present

## 2012-06-02 DIAGNOSIS — M9981 Other biomechanical lesions of cervical region: Secondary | ICD-10-CM | POA: Diagnosis not present

## 2012-06-02 DIAGNOSIS — M62838 Other muscle spasm: Secondary | ICD-10-CM | POA: Diagnosis not present

## 2012-06-03 DIAGNOSIS — M9981 Other biomechanical lesions of cervical region: Secondary | ICD-10-CM | POA: Diagnosis not present

## 2012-06-03 DIAGNOSIS — M999 Biomechanical lesion, unspecified: Secondary | ICD-10-CM | POA: Diagnosis not present

## 2012-06-03 DIAGNOSIS — M62838 Other muscle spasm: Secondary | ICD-10-CM | POA: Diagnosis not present

## 2012-06-03 DIAGNOSIS — M5380 Other specified dorsopathies, site unspecified: Secondary | ICD-10-CM | POA: Diagnosis not present

## 2012-06-05 DIAGNOSIS — M62838 Other muscle spasm: Secondary | ICD-10-CM | POA: Diagnosis not present

## 2012-06-05 DIAGNOSIS — M9981 Other biomechanical lesions of cervical region: Secondary | ICD-10-CM | POA: Diagnosis not present

## 2012-06-05 DIAGNOSIS — M999 Biomechanical lesion, unspecified: Secondary | ICD-10-CM | POA: Diagnosis not present

## 2012-06-05 DIAGNOSIS — M5380 Other specified dorsopathies, site unspecified: Secondary | ICD-10-CM | POA: Diagnosis not present

## 2012-06-09 DIAGNOSIS — M62838 Other muscle spasm: Secondary | ICD-10-CM | POA: Diagnosis not present

## 2012-06-09 DIAGNOSIS — M999 Biomechanical lesion, unspecified: Secondary | ICD-10-CM | POA: Diagnosis not present

## 2012-06-09 DIAGNOSIS — M9981 Other biomechanical lesions of cervical region: Secondary | ICD-10-CM | POA: Diagnosis not present

## 2012-06-09 DIAGNOSIS — M5380 Other specified dorsopathies, site unspecified: Secondary | ICD-10-CM | POA: Diagnosis not present

## 2012-06-12 DIAGNOSIS — M9981 Other biomechanical lesions of cervical region: Secondary | ICD-10-CM | POA: Diagnosis not present

## 2012-06-12 DIAGNOSIS — M999 Biomechanical lesion, unspecified: Secondary | ICD-10-CM | POA: Diagnosis not present

## 2012-06-12 DIAGNOSIS — M62838 Other muscle spasm: Secondary | ICD-10-CM | POA: Diagnosis not present

## 2012-06-12 DIAGNOSIS — M5380 Other specified dorsopathies, site unspecified: Secondary | ICD-10-CM | POA: Diagnosis not present

## 2012-06-23 DIAGNOSIS — R0602 Shortness of breath: Secondary | ICD-10-CM | POA: Diagnosis not present

## 2012-07-14 ENCOUNTER — Encounter (HOSPITAL_COMMUNITY): Payer: Self-pay | Admitting: *Deleted

## 2012-07-14 ENCOUNTER — Emergency Department (HOSPITAL_COMMUNITY): Payer: Medicare Other

## 2012-07-14 ENCOUNTER — Inpatient Hospital Stay (HOSPITAL_COMMUNITY)
Admission: EM | Admit: 2012-07-14 | Discharge: 2012-07-16 | DRG: 247 | Disposition: A | Discharge status: Home / Self Care | Payer: Medicare Other | Attending: Cardiovascular Disease | Admitting: Cardiovascular Disease

## 2012-07-14 DIAGNOSIS — Z0000 Encounter for general adult medical examination without abnormal findings: Secondary | ICD-10-CM | POA: Diagnosis not present

## 2012-07-14 DIAGNOSIS — I249 Acute ischemic heart disease, unspecified: Secondary | ICD-10-CM

## 2012-07-14 DIAGNOSIS — R0602 Shortness of breath: Secondary | ICD-10-CM | POA: Diagnosis not present

## 2012-07-14 DIAGNOSIS — I10 Essential (primary) hypertension: Secondary | ICD-10-CM | POA: Diagnosis present

## 2012-07-14 DIAGNOSIS — Z7902 Long term (current) use of antithrombotics/antiplatelets: Secondary | ICD-10-CM

## 2012-07-14 DIAGNOSIS — Z9861 Coronary angioplasty status: Secondary | ICD-10-CM

## 2012-07-14 DIAGNOSIS — Z882 Allergy status to sulfonamides status: Secondary | ICD-10-CM

## 2012-07-14 DIAGNOSIS — Z1331 Encounter for screening for depression: Secondary | ICD-10-CM | POA: Diagnosis not present

## 2012-07-14 DIAGNOSIS — I200 Unstable angina: Secondary | ICD-10-CM | POA: Diagnosis not present

## 2012-07-14 DIAGNOSIS — Z8249 Family history of ischemic heart disease and other diseases of the circulatory system: Secondary | ICD-10-CM

## 2012-07-14 DIAGNOSIS — I2510 Atherosclerotic heart disease of native coronary artery without angina pectoris: Principal | ICD-10-CM | POA: Diagnosis present

## 2012-07-14 DIAGNOSIS — Z955 Presence of coronary angioplasty implant and graft: Secondary | ICD-10-CM

## 2012-07-14 DIAGNOSIS — M545 Low back pain: Secondary | ICD-10-CM | POA: Diagnosis not present

## 2012-07-14 DIAGNOSIS — M810 Age-related osteoporosis without current pathological fracture: Secondary | ICD-10-CM | POA: Diagnosis present

## 2012-07-14 DIAGNOSIS — R0789 Other chest pain: Secondary | ICD-10-CM | POA: Diagnosis not present

## 2012-07-14 DIAGNOSIS — Z85038 Personal history of other malignant neoplasm of large intestine: Secondary | ICD-10-CM

## 2012-07-14 DIAGNOSIS — E785 Hyperlipidemia, unspecified: Secondary | ICD-10-CM | POA: Diagnosis present

## 2012-07-14 DIAGNOSIS — R079 Chest pain, unspecified: Secondary | ICD-10-CM | POA: Diagnosis not present

## 2012-07-14 DIAGNOSIS — Z6831 Body mass index (BMI) 31.0-31.9, adult: Secondary | ICD-10-CM

## 2012-07-14 DIAGNOSIS — Z79899 Other long term (current) drug therapy: Secondary | ICD-10-CM

## 2012-07-14 DIAGNOSIS — C182 Malignant neoplasm of ascending colon: Secondary | ICD-10-CM | POA: Diagnosis present

## 2012-07-14 DIAGNOSIS — Z7982 Long term (current) use of aspirin: Secondary | ICD-10-CM

## 2012-07-14 DIAGNOSIS — R109 Unspecified abdominal pain: Secondary | ICD-10-CM | POA: Diagnosis not present

## 2012-07-14 DIAGNOSIS — Z888 Allergy status to other drugs, medicaments and biological substances status: Secondary | ICD-10-CM

## 2012-07-14 DIAGNOSIS — Z96649 Presence of unspecified artificial hip joint: Secondary | ICD-10-CM

## 2012-07-14 DIAGNOSIS — E669 Obesity, unspecified: Secondary | ICD-10-CM | POA: Diagnosis present

## 2012-07-14 DIAGNOSIS — Z87891 Personal history of nicotine dependence: Secondary | ICD-10-CM

## 2012-07-14 DIAGNOSIS — Z96659 Presence of unspecified artificial knee joint: Secondary | ICD-10-CM

## 2012-07-14 LAB — CBC WITH DIFFERENTIAL/PLATELET
HCT: 41.4 % (ref 36.0–46.0)
Hemoglobin: 14.1 g/dL (ref 12.0–15.0)
Lymphs Abs: 1 10*3/uL (ref 0.7–4.0)
MCH: 31.1 pg (ref 26.0–34.0)
MCHC: 34.1 g/dL (ref 30.0–36.0)
Monocytes Absolute: 0.5 10*3/uL (ref 0.1–1.0)
Monocytes Relative: 6 % (ref 3–12)
Neutro Abs: 7 10*3/uL (ref 1.7–7.7)
Neutrophils Relative %: 81 % — ABNORMAL HIGH (ref 43–77)
RBC: 4.53 MIL/uL (ref 3.87–5.11)

## 2012-07-14 LAB — TROPONIN I
Troponin I: <0.3 ng/mL (ref <0.30)
Troponin I: <0.3 ng/mL (ref <0.30)

## 2012-07-14 LAB — BASIC METABOLIC PANEL
BUN: 25 mg/dL — ABNORMAL HIGH (ref 6–23)
Chloride: 99 mEq/L (ref 96–112)
Creatinine, Ser: 1 mg/dL (ref 0.50–1.10)
Glucose, Bld: 145 mg/dL — ABNORMAL HIGH (ref 70–99)
Potassium: 3.7 mEq/L (ref 3.5–5.1)

## 2012-07-14 LAB — PROTIME-INR
INR: 1.06 (ref 0.00–1.49)
Prothrombin Time: 13.7 seconds (ref 11.6–15.2)

## 2012-07-14 MED ORDER — SIMVASTATIN 10 MG PO TABS
10.0000 mg | ORAL_TABLET | Freq: Every day | ORAL | Status: DC
  Administered 2012-07-14 – 2012-07-15 (×2): 10 mg via ORAL
  Filled 2012-07-14 (×4): qty 1

## 2012-07-14 MED ORDER — SODIUM CHLORIDE 0.9 % IV SOLN: 1.0000 mL/kg/h | INTRAVENOUS | Status: DC

## 2012-07-14 MED ORDER — FERROUS SULFATE 325 (65 FE) MG PO TABS
325.0000 mg | ORAL_TABLET | Freq: Every day | ORAL | Status: DC
  Filled 2012-07-14 (×4): qty 1

## 2012-07-14 MED ORDER — SODIUM CHLORIDE 0.9 % IV SOLN: 250.0000 mL | INTRAVENOUS | Status: DC | PRN

## 2012-07-14 MED ORDER — HEPARIN BOLUS VIA INFUSION
3500.0000 [IU] | Freq: Once | INTRAVENOUS | Status: AC
  Administered 2012-07-14: 3500 [IU] via INTRAVENOUS
  Filled 2012-07-14: qty 3500

## 2012-07-14 MED ORDER — NITROGLYCERIN 0.4 MG SL SUBL: 0.4000 mg | SUBLINGUAL_TABLET | SUBLINGUAL | Status: DC | PRN

## 2012-07-14 MED ORDER — ALBUTEROL SULFATE HFA 108 (90 BASE) MCG/ACT IN AERS
2.0000 | INHALATION_SPRAY | RESPIRATORY_TRACT | Status: DC | PRN
  Administered 2012-07-15: 18:00:00 2 via RESPIRATORY_TRACT
  Filled 2012-07-14: qty 6.7

## 2012-07-14 MED ORDER — HEPARIN (PORCINE) IN NACL 100-0.45 UNIT/ML-% IJ SOLN
800.0000 [IU]/h | INTRAMUSCULAR | Status: DC
  Administered 2012-07-14: 800 [IU]/h via INTRAVENOUS
  Filled 2012-07-14 (×2): qty 250

## 2012-07-14 MED ORDER — LOVASTATIN ER 20 MG PO TB24: 20.0000 mg | ORAL_TABLET | Freq: Every day | ORAL | Status: DC

## 2012-07-14 MED ORDER — ASPIRIN EC 81 MG PO TBEC
81.0000 mg | DELAYED_RELEASE_TABLET | Freq: Every day | ORAL | Status: DC
  Administered 2012-07-16: 81 mg via ORAL
  Filled 2012-07-14: qty 1

## 2012-07-14 MED ORDER — SODIUM CHLORIDE 0.9 % IJ SOLN
3.0000 mL | Freq: Two times a day (BID) | INTRAMUSCULAR | Status: DC
  Administered 2012-07-14 – 2012-07-15 (×2): 3 mL via INTRAVENOUS

## 2012-07-14 MED ORDER — ACETAMINOPHEN 325 MG PO TABS: 650.0000 mg | ORAL_TABLET | ORAL | Status: DC | PRN

## 2012-07-14 MED ORDER — ONDANSETRON HCL 4 MG/2ML IJ SOLN: 4.0000 mg | Freq: Four times a day (QID) | INTRAMUSCULAR | Status: DC | PRN

## 2012-07-14 MED ORDER — TRIAMTERENE-HCTZ 37.5-25 MG PO CAPS
1.0000 | ORAL_CAPSULE | Freq: Every day | ORAL | Status: DC
  Administered 2012-07-15 – 2012-07-16 (×2): 1 via ORAL
  Filled 2012-07-14 (×4): qty 1

## 2012-07-14 MED ORDER — SODIUM CHLORIDE 0.9 % IJ SOLN: 3.0000 mL | INTRAMUSCULAR | Status: DC | PRN

## 2012-07-14 MED ORDER — ASPIRIN EC 81 MG PO TBEC: 81.0000 mg | DELAYED_RELEASE_TABLET | Freq: Every day | ORAL | Status: DC

## 2012-07-14 MED ORDER — FERROUS SULFATE 325 (65 FE) MG PO TBEC: 325.0000 mg | DELAYED_RELEASE_TABLET | Freq: Every day | ORAL | Status: DC

## 2012-07-14 MED ORDER — ASPIRIN 81 MG PO CHEW
324.0000 mg | CHEWABLE_TABLET | Freq: Once | ORAL | Status: AC
  Administered 2012-07-14: 324 mg via ORAL
  Filled 2012-07-14: qty 4

## 2012-07-14 MED ORDER — METOPROLOL TARTRATE 25 MG PO TABS
25.0000 mg | ORAL_TABLET | Freq: Two times a day (BID) | ORAL | Status: DC
  Administered 2012-07-14 – 2012-07-16 (×4): 25 mg via ORAL
  Filled 2012-07-14 (×6): qty 1

## 2012-07-14 MED ORDER — ASPIRIN 81 MG PO CHEW
324.0000 mg | CHEWABLE_TABLET | ORAL | Status: AC
  Administered 2012-07-15: 324 mg via ORAL
  Filled 2012-07-14: qty 4

## 2012-07-14 NOTE — H&P (Signed)
[name] is an 79 y.o. female.   Chief Complaint:  Chest pain HPI:   The patient is an 79 yo female with a history of CAD with BMS to the OM2 of the circumflex by Dr. Jenne Campus in 2002.  She had subsequent cutting balloon angioplasty for in-stent restenosis 2003 by Dr. Elsie Lincoln. The stent was patent in 2004 with normal LV function.  Her last echo was 04/12/09 and EF was >55% with no regional wall motion abnormalities.   Her history also includes colon cancer with surgery seven months ago, asthma, HLD, obesity, anemia, HTN.  She reports having left axillary rib pain last night which was worse with breathing.  This resolved.  This morning while at Dr. Laverle Hobby office she developed 7-8/10 CP,"tightness" with radiation to both sides of there neck and questionably to her left shoulder.  She does have a history of bilateral shoulder pain and the last time the left one hurt was approximately two weeks ago.  She also reports feeling "jittery" and SOB.  She was given two SL NTG which resolved the pain fairly quickly.  She denies N, V, dizziness, fever, cough, congestion, abd pain, LEE, orthopnea, PND, hematochezia, melena.   Past Medical History  Diagnosis Date  . Arthritis   . Asthma   . Adenomatous colon polyp MAY 2013    HOSPITALIZED AT [facility] WITH HGB 7 -TRANSFUSED-MASS FOUND IN ASCENDING COLON  . Diverticulosis   . HLD (hyperlipidemia)   . Obesity   . Internal hemorrhoids   . Osteoporosis   . Anemia   . Blood transfusion MAY 2013  . Cancer   . Heart attack 01/01/2001    AUG 2002 HEART STENT PLACEMENT  . Hypertension   . Complication of anesthesia 2009    HIP REPLACEMENT-PT HAD HARD TIME WAKING UP--FELT LIKE SHE COULDN'T BREATHE  . CAD (coronary artery disease)     "SINGLE VESSEL CORONARY DISEASE WITH CX-OM-2 STENTING IN 2002 AND CARDIAC BALLOON ATHERECTOMY FOR IN-STENT RESTENOSIS IN 2003 ICH WAS WIDELY PATENT ON CATHETERIZATION IN 2004 " - PER CARDIOLOGY OFFICE NOTE 11/23/11 FROM DR. Alanda Amass     Past Surgical History  Procedure Date  . Appendectomy   . Cholecystectomy   . Coronary angioplasty with stent placement   . Colonoscopy w/ biopsies and polypectomy 06/15/2008    adenomatous polyps, diverticulosis, internal hemorrhoids  . Total hip arthroplasty 09/08/2007    left, Dr. Lequita Halt  . Colonoscopy 11/16/2011    Procedure: COLONOSCOPY;  Surgeon: Iva Boop, MD;  Location: [facility] ENDOSCOPY;  Service: Endoscopy;  Laterality: N/A;  . Knee replacement     left  . Joint replacement     RT HIP 2003  L KNEE 2003  LEFT HIP 2009    Family History  Problem Relation Age of Onset  . Pancreatic cancer Father   . Cancer Father     prostate  . Pancreatic cancer Brother   . Cancer Brother     colon  . Heart disease Mother   . Heart disease Sister   . Colon cancer Brother     ? Colostomy  . Cancer Brother     prostate   Social History:  reports that she has quit smoking. Her smoking use included Cigarettes. She quit after 30 years of use. She quit smokeless tobacco use about 29 years ago. She reports that she drinks alcohol. She reports that she does not use illicit drugs.  Allergies:  Allergies  Allergen Reactions  . Atorvastatin  REACTION: shortness of breath, cramping in back  . Clopidogrel Bisulfate     REACTION: shortness of breath, cramping in back  . Codeine     REACTION: vomiting  . Sulfonamide Derivatives     REACTION: intestinal upset     (Not in a hospital admission)  Results for orders placed during the hospital encounter of 07/14/12 (from the past 48 hour(s))  CBC WITH DIFFERENTIAL     Status: Abnormal   Collection Time   07/14/12 12:04 PM      Component Value Range Comment   WBC 8.6  4.0 - 10.5 K/uL    RBC 4.53  3.87 - 5.11 MIL/uL    Hemoglobin 14.1  12.0 - 15.0 g/dL    HCT 11.9  14.7 - 82.9 %    MCV 91.4  78.0 - 100.0 fL    MCH 31.1  26.0 - 34.0 pg    MCHC 34.1  30.0 - 36.0 g/dL    RDW 56.2  13.0 - 86.5 %    Platelets 208  150 - 400 K/uL     Neutrophils Relative 81 (*) 43 - 77 %    Neutro Abs 7.0  1.7 - 7.7 K/uL    Lymphocytes Relative 12  12 - 46 %    Lymphs Abs 1.0  0.7 - 4.0 K/uL    Monocytes Relative 6  3 - 12 %    Monocytes Absolute 0.5  0.1 - 1.0 K/uL    Eosinophils Relative 1  0 - 5 %    Eosinophils Absolute 0.1  0.0 - 0.7 K/uL    Basophils Relative 0  0 - 1 %    Basophils Absolute 0.0  0.0 - 0.1 K/uL   BASIC METABOLIC PANEL     Status: Abnormal   Collection Time   07/14/12 12:04 PM      Component Value Range Comment   Sodium 141  135 - 145 mEq/L    Potassium 3.7  3.5 - 5.1 mEq/L    Chloride 99  96 - 112 mEq/L    CO2 28  19 - 32 mEq/L    Glucose, Bld 145 (*) 70 - 99 mg/dL    BUN 25 (*) 6 - 23 mg/dL    Creatinine, Ser 7.84  0.50 - 1.10 mg/dL    Calcium 69.6  8.4 - 10.5 mg/dL    GFR calc non Af Amer 51 (*) >90 mL/min    GFR calc Af Amer 59 (*) >90 mL/min   PROTIME-INR     Status: Normal   Collection Time   07/14/12 12:04 PM      Component Value Range Comment   Prothrombin Time 13.7  11.6 - 15.2 seconds    INR 1.06  0.00 - 1.49   APTT     Status: Normal   Collection Time   07/14/12 12:04 PM      Component Value Range Comment   aPTT 28  24 - 37 seconds   TROPONIN I     Status: Normal   Collection Time   07/14/12 12:05 PM      Component Value Range Comment   Troponin I <0.30  <0.30 ng/mL    Dg Chest Port 1 View  07/14/2012  *RADIOLOGY REPORT*  Clinical Data: Chest pain, shortness of breath, history asthma, hypertension, coronary artery disease post MI  PORTABLE CHEST - 1 VIEW  Comparison: Portable exam 1156 hours compared to 08/27/2011  Findings: Normal heart size, mediastinal contours, and pulmonary vascularity.  Atherosclerotic calcification aortic arch. Lungs clear. No pleural effusion or pneumothorax. Bilateral glenohumeral degenerative changes and minimal scattered endplate spur formation thoracic spine.  IMPRESSION: No acute abnormalities.   Original Report Authenticated By: Ulyses Southward, M.D.     Review of  Systems  Constitutional: Negative for fever and diaphoresis.  HENT: Negative for congestion and sore throat.   Respiratory: Positive for shortness of breath. Negative for cough.   Cardiovascular: Positive for chest pain. Negative for palpitations, orthopnea, leg swelling and PND.  Gastrointestinal: Negative for nausea, vomiting, abdominal pain, diarrhea, constipation, blood in stool and melena.  Genitourinary: Negative for dysuria and hematuria.  Musculoskeletal: Positive for myalgias (left shoulder and jaw pain).  Neurological: Negative for dizziness.    Blood pressure 135/70, pulse 91, temperature 97.9 F (36.6 C), temperature source Oral, resp. rate 16, SpO2 98.00%. Physical Exam  Constitutional: She is oriented to person, place, and time. She appears well-developed and well-nourished. No distress.  HENT:  Head: Normocephalic and atraumatic.  Mouth/Throat: No oropharyngeal exudate.  Eyes: EOM are normal. Pupils are equal, round, and reactive to light. No scleral icterus.  Neck: Normal range of motion. Neck supple. No JVD present.  Cardiovascular: Normal rate, regular rhythm, S1 normal and S2 normal.   No murmur heard. Pulses:      Radial pulses are 2+ on the right side, and 2+ on the left side.       Dorsalis pedis pulses are 2+ on the right side, and 2+ on the left side.       No Carotid Bruits  Respiratory: Effort normal and breath sounds normal. No respiratory distress. She has no wheezes. She has no rales.  GI: Soft. Bowel sounds are normal. There is tenderness (mild LUQ tenderness).  Lymphadenopathy:    She has no cervical adenopathy.  Neurological: She is alert and oriented to person, place, and time. She exhibits normal muscle tone.  Skin: Skin is warm and dry.  Psychiatric: She has a normal mood and affect.     Assessment/Plan Patient Active Hospital Problem List: Unstable angina (07/14/2012) HYPERLIPIDEMIA (11/29/2009) HYPERTENSION (11/29/2009) CORONARY ARTERY  DISEASE: BMS to circ OM2 in 2002 with angiopalsty for ISRS 2003.  PAtent in 2004. (11/29/2009) Carcinoma of ascending colon (11/16/2011)  Plan:  EKG with no acute changes but her story is concerning for [place].   Will admit for observation.  Cycle troponin.  Start IV heparin and nitro.  The patient states that she had a stress test prior to colon surgery seven months, however, I do not see record of it.  Left heart cath likely in the AM.  Will continue lopressor and statin.  Check lipids, A1C.   HAGER, BRYAN 07/14/2012, 2:31 PM   The patient called me to her room and stated the left axillary pain and shoulder pain had returned.  Both exacerbated with palpation.  Will add PRN toradol 15mg .  I have seen and examined the patient along with HAGER, BRYAN, PA.  I have reviewed the chart, notes and new data.  I agree with PA's note.  Key new complaints: current pain is clearly musculoskeletal, but the tightness that radiates across her anterior chest was not associated with other joint symptoms and could represent angina at rest. Hard to separate her complaints into a clear syndrome.  Key examination changes: exam significant for a weaker DP pulse in the left foot (1+ compared to 2+ on the right), but otherwise benign Key new findings / data: ECG and enzymes low risk. She  did have a Lexiscan Myoview on May 31, reportedly normal.  PLAN: It has been almost 10 years since her last cardiac cath and she prefers a definitive answer as to the etiology of her complaints, which makes coronary angiography the best option (preferably radial approach).  Thurmon Fair, MD, [facility] [phone] 07/14/2012, 3:44 PM

## 2012-07-14 NOTE — ED Notes (Signed)
Pt in from [facility] via [facility] pt was seen for a check up & developed non radiating mid CP that started @ 10:00 today, pt reports bil lower back pain onset 03:00 today, pt now c/o mid abd pain onset with EMS, pt received x 2 SL nitro & 81 mg ASA prior to arrival, pt A&O x4, follows commands, & speaks in complete sentences

## 2012-07-14 NOTE — Progress Notes (Signed)
ANTICOAGULATION CONSULT NOTE - Initial Consult  Pharmacy Consult for heparin Indication: chest pain/ACS  Allergies  Allergen Reactions  . Atorvastatin     REACTION: shortness of breath, cramping in back  . Clopidogrel Bisulfate     REACTION: shortness of breath, cramping in back  . Codeine     REACTION: vomiting  . Sulfonamide Derivatives     REACTION: intestinal upset    Patient Measurements:   Heparin Dosing Weight: 62  Vital Signs: Temp: 97.9 F (36.6 C) (01/13 1206) Temp src: Oral (01/13 1206) BP: 125/60 mmHg (01/13 1600) Pulse Rate: 89  (01/13 1600)  Labs:  Basename 07/14/12 1205 07/14/12 1204  HGB -- 14.1  HCT -- 41.4  PLT -- 208  APTT -- 28  LABPROT -- 13.7  INR -- 1.06  HEPARINUNFRC -- --  CREATININE -- 1.00  CKTOTAL -- --  CKMB -- --  TROPONINI <0.30 --    The CrCl is unknown because both a height and weight (above a minimum accepted value) are required for this calculation.   Medical History: Past Medical History  Diagnosis Date  . Arthritis   . Asthma   . Adenomatous colon polyp MAY 2013    HOSPITALIZED AT [facility] WITH HGB 7 -TRANSFUSED-MASS FOUND IN ASCENDING COLON  . Diverticulosis   . HLD (hyperlipidemia)   . Obesity   . Internal hemorrhoids   . Osteoporosis   . Anemia   . Blood transfusion MAY 2013  . Cancer   . Heart attack 01/01/2001    AUG 2002 HEART STENT PLACEMENT  . Hypertension   . Complication of anesthesia 2009    HIP REPLACEMENT-PT HAD HARD TIME WAKING UP--FELT LIKE SHE COULDN'T BREATHE  . CAD (coronary artery disease)     "SINGLE VESSEL CORONARY DISEASE WITH CX-OM-2 STENTING IN 2002 AND CARDIAC BALLOON ATHERECTOMY FOR IN-STENT RESTENOSIS IN 2003 ICH WAS WIDELY PATENT ON CATHETERIZATION IN 2004 " - PER CARDIOLOGY OFFICE NOTE 11/23/11 FROM DR. Alanda Amass    Medications:  Prescriptions prior to admission  Medication Sig Dispense Refill  . acetaminophen (TYLENOL) 325 MG tablet Take 650 mg by mouth every 6 (six) hours as needed.  For pain      . albuterol (PROVENTIL HFA;VENTOLIN HFA) 108 (90 BASE) MCG/ACT inhaler Inhale 2 puffs into the lungs every 4 (four) hours as needed. For shortness of breath      . aspirin 81 MG tablet Take 81 mg by mouth daily.      . Calcium Carbonate-Vitamin D (CALCIUM + D PO) Take 1 tablet by mouth daily. Calcium 500mg  with Vitamin D      . cetirizine (ZYRTEC) 10 MG tablet Take 5 mg by mouth 2 (two) times daily.       . cholecalciferol (VITAMIN D) 1000 UNITS tablet Take 1,000 Units by mouth daily.      . ferrous sulfate 325 (65 FE) MG EC tablet Take 162.5 mg by mouth daily with breakfast.       . lovastatin (ALTOPREV) 20 MG 24 hr tablet Take 20 mg by mouth at bedtime.      . metoprolol (LOPRESSOR) 50 MG tablet Take 25 mg by mouth 2 (two) times daily.       [name] Kitchen triamterene-hydrochlorothiazide (DYAZIDE) 37.5-25 MG per capsule Take 1 capsule by mouth every morning.       Scheduled:    . [COMPLETED] aspirin  324 mg Oral Once  . aspirin EC  81 mg Oral Daily  . ferrous sulfate  325 mg  Oral Q breakfast  . lovastatin  20 mg Oral QHS  . metoprolol  25 mg Oral BID  . triamterene-hydrochlorothiazide  1 each Oral [id number]    Assessment: 79 yo with h/o CAD who was admitted for CP. IV heparin will be started to r/o MI while awaiting cath.   Goal of Therapy:  Heparin level 0.3-0.7 units/ml Monitor platelets by anticoagulation protocol: Yes   Plan:  Heparin bolus 3500 units x1 Heparin drip at 800 units/hr F/u with 8 hr heparin level  [name] 07/14/2012,4:58 PM

## 2012-07-14 NOTE — ED Provider Notes (Signed)
History     CSN: [id number]  Arrival date & time 07/14/12  1103   First MD Initiated Contact with Patient 07/14/12 1126      Chief Complaint  Patient presents with  . Chest Pain    (Consider location/radiation/quality/duration/timing/severity/associated sxs/prior treatment) Patient is a 79 y.o. female presenting with chest pain. The history is provided by the patient.  Chest Pain The chest pain began 1 - 2 hours ago (Initially had an episode of atypical chest pain around 3 AM this morning at her on the left side of her chest only with deep breaths. That resolved before she woke up and then while she was in her doctor's office approximately one hour ago she developed ce). Episode Length: Pain is still present. Chest pain occurs constantly. The chest pain is improving. Associated with: Started while she was stationary. At its most intense, the pain is at 7/10. The pain is currently at 2/10. The severity of the pain is moderate. The quality of the pain is described as aching, pressure-like and sharp. The pain radiates to the left jaw, right jaw and left arm. Chest pain is worsened by deep breathing. Primary symptoms include shortness of breath. Pertinent negatives for primary symptoms include no fever, no cough, no wheezing, no palpitations, no nausea and no vomiting.  Pertinent negatives for associated symptoms include no diaphoresis, no lower extremity edema and no near-syncope. She tried nitroglycerin (Moderate improvement with 2 nitroglycerin ) for the symptoms. Risk factors include being elderly.  Her past medical history is significant for CAD, hyperlipidemia and hypertension.  Pertinent negatives for past medical history include no diabetes.  Procedure history is positive for cardiac catheterization. Procedure history comments: stent placed in the LAD 10 year ago.     Past Medical History  Diagnosis Date  . Arthritis   . Asthma   . Adenomatous colon polyp MAY 2013    HOSPITALIZED AT  [facility] WITH HGB 7 -TRANSFUSED-MASS FOUND IN ASCENDING COLON  . Diverticulosis   . HLD (hyperlipidemia)   . Obesity   . Internal hemorrhoids   . Osteoporosis   . Anemia   . Blood transfusion MAY 2013  . Cancer   . Heart attack 01/01/2001    AUG 2002 HEART STENT PLACEMENT  . Hypertension   . Complication of anesthesia 2009    HIP REPLACEMENT-PT HAD HARD TIME WAKING UP--FELT LIKE SHE COULDN'T BREATHE  . CAD (coronary artery disease)     "SINGLE VESSEL CORONARY DISEASE WITH CX-OM-2 STENTING IN 2002 AND CARDIAC BALLOON ATHERECTOMY FOR IN-STENT RESTENOSIS IN 2003 ICH WAS WIDELY PATENT ON CATHETERIZATION IN 2004 " - PER CARDIOLOGY OFFICE NOTE 11/23/11 FROM DR. Alanda Amass    Past Surgical History  Procedure Date  . Appendectomy   . Cholecystectomy   . Coronary angioplasty with stent placement   . Colonoscopy w/ biopsies and polypectomy 06/15/2008    adenomatous polyps, diverticulosis, internal hemorrhoids  . Total hip arthroplasty 09/08/2007    left, Dr. Lequita Halt  . Colonoscopy 11/16/2011    Procedure: COLONOSCOPY;  Surgeon: Iva Boop, MD;  Location: [facility] ENDOSCOPY;  Service: Endoscopy;  Laterality: N/A;  . Knee replacement     left  . Joint replacement     RT HIP 2003  L KNEE 2003  LEFT HIP 2009    Family History  Problem Relation Age of Onset  . Pancreatic cancer Father   . Cancer Father     prostate  . Pancreatic cancer Brother   . Cancer Brother  colon  . Heart disease Mother   . Heart disease Sister   . Colon cancer Brother     ? Colostomy  . Cancer Brother     prostate    History  Substance Use Topics  . Smoking status: Former Smoker -- 30 years    Types: Cigarettes  . Smokeless tobacco: Former Neurosurgeon    Quit date: 11/23/1982  . Alcohol Use: Yes     Comment: occ wine    OB History    Grav Para Term Preterm Abortions TAB SAB Ect Mult Living                  Review of Systems  Constitutional: Negative for fever and diaphoresis.  Respiratory: Positive for  shortness of breath. Negative for cough and wheezing.   Cardiovascular: Positive for chest pain. Negative for palpitations, leg swelling and near-syncope.  Gastrointestinal: Negative for nausea and vomiting.  All other systems reviewed and are negative.    Allergies  Atorvastatin; Clopidogrel bisulfate; Codeine; and Sulfonamide derivatives  Home Medications   Current Outpatient Rx  Name  Route  Sig  Dispense  Refill  . ACETAMINOPHEN 325 MG PO TABS   Oral   Take 650 mg by mouth every 6 (six) hours as needed. For pain         . ALBUTEROL SULFATE HFA 108 (90 BASE) MCG/ACT IN AERS   Inhalation   Inhale 2 puffs into the lungs every 4 (four) hours as needed. For shortness of breath         . ASPIRIN 81 MG PO TABS   Oral   Take 81 mg by mouth daily.         [name] Kitchen CALCIUM + D PO   Oral   Take 1 tablet by mouth daily. Calcium 500mg  with Vitamin D         . CETIRIZINE HCL 10 MG PO TABS   Oral   Take 5 mg by mouth 2 (two) times daily.          [name] Kitchen VITAMIN D 1000 UNITS PO TABS   Oral   Take 1,000 Units by mouth daily.         [name] Kitchen FERROUS SULFATE 325 (65 FE) MG PO TBEC   Oral   Take 162.5 mg by mouth daily with breakfast.          . LOVASTATIN ER 20 MG PO TB24   Oral   Take 20 mg by mouth at bedtime.         [name] Kitchen METOPROLOL TARTRATE 50 MG PO TABS   Oral   Take 25 mg by mouth 2 (two) times daily.          . TRIAMTERENE-HCTZ 37.5-25 MG PO CAPS   Oral   Take 1 capsule by mouth every morning.           Pulse 89  Resp 16  SpO2 96%  Physical Exam  Nursing note and vitals reviewed. Constitutional: She is oriented to person, place, and time. She appears well-developed and well-nourished. No distress.  HENT:  Head: Normocephalic and atraumatic.  Mouth/Throat: Oropharynx is clear and moist.  Eyes: Conjunctivae normal and EOM are normal. Pupils are equal, round, and reactive to light.  Neck: Normal range of motion. Neck supple.  Cardiovascular: Normal rate,  regular rhythm and intact distal pulses.   No murmur heard. Pulmonary/Chest: Effort normal and breath sounds normal. No respiratory distress. She has no wheezes. She has no rales. She  exhibits tenderness.  Abdominal: Soft. She exhibits no distension. There is no tenderness. There is no rebound and no guarding.  Musculoskeletal: Normal range of motion. She exhibits no edema and no tenderness.  Neurological: She is alert and oriented to person, place, and time.  Skin: Skin is warm and dry. No rash noted. No erythema.  Psychiatric: She has a normal mood and affect. Her behavior is normal.    ED Course  Procedures (including critical care time)  Labs Reviewed  CBC WITH DIFFERENTIAL - Abnormal; Notable for the following:    Neutrophils Relative 81 (*)     All other components within normal limits  BASIC METABOLIC PANEL - Abnormal; Notable for the following:    Glucose, Bld 145 (*)     BUN 25 (*)     GFR calc non Af Amer 51 (*)     GFR calc Af Amer 59 (*)     All other components within normal limits  PROTIME-INR  APTT  TROPONIN I   Dg Chest Port 1 View  07/14/2012  *RADIOLOGY REPORT*  Clinical Data: Chest pain, shortness of breath, history asthma, hypertension, coronary artery disease post MI  PORTABLE CHEST - 1 VIEW  Comparison: Portable exam 1156 hours compared to 08/27/2011  Findings: Normal heart size, mediastinal contours, and pulmonary vascularity. Atherosclerotic calcification aortic arch. Lungs clear. No pleural effusion or pneumothorax. Bilateral glenohumeral degenerative changes and minimal scattered endplate spur formation thoracic spine.  IMPRESSION: No acute abnormalities.   Original Report Authenticated By: Ulyses Southward, M.D.      Date: 07/14/2012  Rate: 89  Rhythm: normal sinus rhythm  QRS Axis: normal  Intervals: normal  ST/T Wave abnormalities: normal  Conduction Disutrbances: none  Narrative Interpretation: unremarkable     1. ACS (acute coronary syndrome)        MDM   Patient with a history of chest pain today that's concerning for ACS. Initially woke up this a.m. with more of a pleuritic type pain that was gone by the time she woke up this morning. She was at her doctor's office for regular check up when she developed midsternal chest pain that radiated into bilateral jaws and down her left arm. It caused her to be short of breath but no other associated symptoms. It improved greatly after 2 sublingual mitral glycerin. Patient states she has not had any aspirin today. She was given a 325 aspirin here as well as 2 of her nitroglycerin tabs. As she was still having some pain control. EKG is normal today. When asked about risk factors for DVT she has no prior history of DVT and no risk factors.  She does have a heart history of a stent placed approximately 10 years ago. Also more recently she had a history of anemia do to cancerous colon polyp which was removed and the anemia has resolved.  CBC, BMP, coags, troponin, chest x-ray pending. Feel this is most likely ACS and will discuss with her cardiologist  1:19 PM All labs are within normal limits and after nitroglycerin here patient is pain-free. Spoke with cardiology and they will admit patient for further evaluation the    Gwyneth Sprout, MD 07/14/12 1319

## 2012-07-14 NOTE — ED Notes (Signed)
Pt denies taking ASA today

## 2012-07-15 ENCOUNTER — Encounter (HOSPITAL_COMMUNITY)
Admission: EM | Disposition: A | Discharge status: Home / Self Care | Payer: Self-pay | Source: Home / Self Care | Attending: Cardiovascular Disease

## 2012-07-15 DIAGNOSIS — Z87891 Personal history of nicotine dependence: Secondary | ICD-10-CM | POA: Diagnosis not present

## 2012-07-15 DIAGNOSIS — Z96649 Presence of unspecified artificial hip joint: Secondary | ICD-10-CM | POA: Diagnosis not present

## 2012-07-15 DIAGNOSIS — Z9861 Coronary angioplasty status: Secondary | ICD-10-CM | POA: Diagnosis not present

## 2012-07-15 DIAGNOSIS — I200 Unstable angina: Secondary | ICD-10-CM | POA: Diagnosis not present

## 2012-07-15 DIAGNOSIS — E785 Hyperlipidemia, unspecified: Secondary | ICD-10-CM | POA: Diagnosis present

## 2012-07-15 DIAGNOSIS — E669 Obesity, unspecified: Secondary | ICD-10-CM | POA: Diagnosis present

## 2012-07-15 DIAGNOSIS — I10 Essential (primary) hypertension: Secondary | ICD-10-CM | POA: Diagnosis present

## 2012-07-15 DIAGNOSIS — Z888 Allergy status to other drugs, medicaments and biological substances status: Secondary | ICD-10-CM | POA: Diagnosis not present

## 2012-07-15 DIAGNOSIS — Z7902 Long term (current) use of antithrombotics/antiplatelets: Secondary | ICD-10-CM | POA: Diagnosis not present

## 2012-07-15 DIAGNOSIS — Z882 Allergy status to sulfonamides status: Secondary | ICD-10-CM | POA: Diagnosis not present

## 2012-07-15 DIAGNOSIS — Z96659 Presence of unspecified artificial knee joint: Secondary | ICD-10-CM | POA: Diagnosis not present

## 2012-07-15 DIAGNOSIS — M810 Age-related osteoporosis without current pathological fracture: Secondary | ICD-10-CM | POA: Diagnosis present

## 2012-07-15 DIAGNOSIS — Z79899 Other long term (current) drug therapy: Secondary | ICD-10-CM | POA: Diagnosis not present

## 2012-07-15 DIAGNOSIS — I2510 Atherosclerotic heart disease of native coronary artery without angina pectoris: Secondary | ICD-10-CM | POA: Diagnosis not present

## 2012-07-15 DIAGNOSIS — Z85038 Personal history of other malignant neoplasm of large intestine: Secondary | ICD-10-CM | POA: Diagnosis not present

## 2012-07-15 DIAGNOSIS — Z8249 Family history of ischemic heart disease and other diseases of the circulatory system: Secondary | ICD-10-CM | POA: Diagnosis not present

## 2012-07-15 DIAGNOSIS — Z6831 Body mass index (BMI) 31.0-31.9, adult: Secondary | ICD-10-CM | POA: Diagnosis not present

## 2012-07-15 DIAGNOSIS — Z7982 Long term (current) use of aspirin: Secondary | ICD-10-CM | POA: Diagnosis not present

## 2012-07-15 HISTORY — PX: PERCUTANEOUS CORONARY STENT INTERVENTION (PCI-S): SHX5485

## 2012-07-15 HISTORY — PX: LEFT HEART CATHETERIZATION WITH CORONARY ANGIOGRAM: SHX5451

## 2012-07-15 LAB — TSH: TSH: 1.367 u[IU]/mL (ref 0.350–4.500)

## 2012-07-15 LAB — BASIC METABOLIC PANEL
CO2: 24 mEq/L (ref 19–32)
Calcium: 9.5 mg/dL (ref 8.4–10.5)
Creatinine, Ser: 0.78 mg/dL (ref 0.50–1.10)
GFR calc non Af Amer: 76 mL/min — ABNORMAL LOW (ref >90)
Sodium: 139 mEq/L (ref 135–145)

## 2012-07-15 LAB — LIPID PANEL
Cholesterol: 158 mg/dL (ref 0–200)
VLDL: 20 mg/dL (ref 0–40)

## 2012-07-15 LAB — TROPONIN I: Troponin I: <0.3 ng/mL (ref <0.30)

## 2012-07-15 LAB — POCT ACTIVATED CLOTTING TIME: Activated Clotting Time: 481 seconds

## 2012-07-15 LAB — HEPARIN LEVEL (UNFRACTIONATED): Heparin Unfractionated: 0.46 IU/mL (ref 0.30–0.70)

## 2012-07-15 SURGERY — LEFT HEART CATHETERIZATION WITH CORONARY ANGIOGRAM
Anesthesia: LOCAL

## 2012-07-15 MED ORDER — VERAPAMIL HCL 2.5 MG/ML IV SOLN
INTRAVENOUS | Status: AC
  Filled 2012-07-15: qty 2

## 2012-07-15 MED ORDER — TICAGRELOR 90 MG PO TABS
90.0000 mg | ORAL_TABLET | Freq: Two times a day (BID) | ORAL | Status: DC
  Administered 2012-07-15 – 2012-07-16 (×2): 90 mg via ORAL
  Filled 2012-07-15 (×3): qty 1

## 2012-07-15 MED ORDER — BIVALIRUDIN 250 MG IV SOLR
INTRAVENOUS | Status: AC
  Filled 2012-07-15: qty 250

## 2012-07-15 MED ORDER — FENTANYL CITRATE 0.05 MG/ML IJ SOLN
INTRAMUSCULAR | Status: AC
  Filled 2012-07-15: qty 2

## 2012-07-15 MED ORDER — FENTANYL CITRATE 0.05 MG/ML IJ SOLN: 25.0000 ug | INTRAMUSCULAR | Status: DC | PRN

## 2012-07-15 MED ORDER — MIDAZOLAM HCL 2 MG/2ML IJ SOLN
INTRAMUSCULAR | Status: AC
  Filled 2012-07-15: qty 2

## 2012-07-15 MED ORDER — HEPARIN (PORCINE) IN NACL 2-0.9 UNIT/ML-% IJ SOLN
INTRAMUSCULAR | Status: AC
  Filled 2012-07-15: qty 1000

## 2012-07-15 MED ORDER — TICAGRELOR 90 MG PO TABS
ORAL_TABLET | ORAL | Status: AC
  Filled 2012-07-15: qty 2

## 2012-07-15 MED ORDER — POTASSIUM CHLORIDE CRYS ER 20 MEQ PO TBCR
40.0000 meq | EXTENDED_RELEASE_TABLET | Freq: Once | ORAL | Status: AC
  Administered 2012-07-15: 40 meq via ORAL
  Filled 2012-07-15: qty 1

## 2012-07-15 MED ORDER — LIDOCAINE HCL (PF) 1 % IJ SOLN
INTRAMUSCULAR | Status: AC
  Filled 2012-07-15: qty 30

## 2012-07-15 MED ORDER — ZOLPIDEM TARTRATE 5 MG PO TABS
5.0000 mg | ORAL_TABLET | Freq: Every evening | ORAL | Status: DC | PRN
  Administered 2012-07-16: 01:00:00 5 mg via ORAL
  Filled 2012-07-15: qty 1

## 2012-07-15 MED ORDER — NITROGLYCERIN 0.2 MG/ML ON CALL CATH LAB
INTRAVENOUS | Status: AC
  Filled 2012-07-15: qty 1

## 2012-07-15 MED ORDER — TICAGRELOR 90 MG PO TABS: 90.0000 mg | ORAL_TABLET | Freq: Two times a day (BID) | ORAL | Status: DC

## 2012-07-15 MED ORDER — HEPARIN SODIUM (PORCINE) 1000 UNIT/ML IJ SOLN
INTRAMUSCULAR | Status: AC
  Filled 2012-07-15: qty 1

## 2012-07-15 NOTE — Progress Notes (Signed)
ANTICOAGULATION CONSULT NOTE Pharmacy Consult for heparin Indication: chest pain/ACS  Allergies  Allergen Reactions  . Atorvastatin     REACTION: shortness of breath, cramping in back  . Clopidogrel Bisulfate     REACTION: shortness of breath, cramping in back  . Codeine     REACTION: vomiting  . Sulfonamide Derivatives     REACTION: intestinal upset    Patient Measurements: Height: 5' (152.4 cm) Weight: 160 lb 12.8 oz (72.938 kg) IBW/kg (Calculated) : 45.5  Heparin Dosing Weight: 62  Vital Signs: Temp: 98.2 F (36.8 C) (01/14 0456) Temp src: Oral (01/14 0456) BP: 130/70 mmHg (01/14 0456) Pulse Rate: 75  (01/14 0456)  Labs:  Basename 07/15/12 0430 07/14/12 2226 07/14/12 1716 07/14/12 1205 07/14/12 1204  HGB -- -- -- -- 14.1  HCT -- -- -- -- 41.4  PLT -- -- -- -- 208  APTT -- -- -- -- 28  LABPROT -- -- -- -- 13.7  INR -- -- -- -- 1.06  HEPARINUNFRC 0.46 -- -- -- --  CREATININE -- -- -- -- 1.00  CKTOTAL -- -- -- -- --  CKMB -- -- -- -- --  TROPONINI -- <0.30 <0.30 <0.30 --    Estimated Creatinine Clearance: 38.7 ml/min (by C-G formula based on Cr of 1).  Assessment: 79 yo with chest pain for heparin  Goal of Therapy:  Heparin level 0.3-0.7 units/ml Monitor platelets by anticoagulation protocol: Yes   Plan:  Continue Heparin at current rate F/U after cath  Eddie Candle 07/15/2012,5:26 AM

## 2012-07-15 NOTE — Progress Notes (Signed)
Nutrition Brief Note  Patient identified on the Malnutrition Screening Tool (MST) Report.  Per readings below, patient has lost weight since May 2013 ---> not significant for time frame; desirable given obesity.  Wt Readings from Last 10 Encounters:  07/15/12 160 lb 12.8 oz (72.938 kg)  07/15/12 160 lb 12.8 oz (72.938 kg)  01/29/12 165 lb (74.844 kg)  01/11/12 164 lb 12.8 oz (74.753 kg)  01/01/12 166 lb 8 oz (75.524 kg)  12/13/11 174 lb (78.926 kg)  12/13/11 174 lb (78.926 kg)  12/06/11 174 lb (78.926 kg)  11/23/11 172 lb 9.6 oz (78.291 kg)  11/14/11 172 lb 2.9 oz (78.1 kg)    Body mass index is 31.40 kg/(m^2). Patient meets criteria for Obesity Class I based on current BMI.   Current diet order is Heart Healthy, patient is consuming approximately 100% of meals at this time. Labs and medications reviewed.   No nutrition interventions warranted at this time. Please consult RD as needed.  Maureen Chatters, RD, LDN Pager #: [phone] After-Hours Pager #: [phone]

## 2012-07-15 NOTE — Progress Notes (Signed)
Utilization review completed.  

## 2012-07-15 NOTE — CV Procedure (Signed)
[facility] CARDIAC CATHETERIZATION AND PERCUTANEOUS CORONARY INTERVENTION REPORT  NAME:  [name]   MRN: [id number] DOB:  Jul 23, 1927   ADMIT DATE: 07/14/2012 Procedure Date: 07/15/2012  INTERVENTIONAL CARDIOLOGIST: Marykay Lex, M.D., MS PRIMARY CARE PROVIDER: Ginette Otto, MD PRIMARY CARDIOLOGIST: Thurmon Fair, MD  PATIENT:  [name] is a 79 y.o. female history of Coronary Artery Disease Status post BMS (3.0 x 12 mm) PCI OM 2 and subsequent Cutting Balloon PTCA for ISR (in-stent restenosis) in 2003. Last cath because of for quite exam. She also is history of hypertension hyperlipidemia as well as obesity. She is status post partial colectomy for colon cancer. She reported the [facility] with left-sided chest pain. She went to her primary care doctor's office where she developed 7-8/10 chest tightness radiating to both jaws. She also noted feeling jittery. She was given to sublingual nitroglycerin and the symptoms improved.   She is seen and evaluated with Dr. Royann Shivers who felt the best way to evaluate her symptoms that were concerning for unstable angina was proceeding directly to cardiac catheterization.  PRE-OPERATIVE DIAGNOSIS:    Unstable Angina - Angina at rest  Known Coronary Artery Disease  PROCEDURES PERFORMED:    Left Heart Catheterization with Native Coronary Angiography via 5 Fr Right Radial Artery Access  Left Ventriculography  Percutaneous Coronary Intervention of the proximal RCA with a Promus Premier DES 2.75 mm 20 mm -- postdilated to 3.0 mm  PROCEDURE: Consent:  Risks of procedure as well as the alternatives and risks of each were explained to the (patient/caregiver).  Consent for procedure obtained. Consent for signed by MD and patient with RN witness -- placed on chart.  PROCEDURE: The patient was brought to the 2nd Floor [facility] in the fasting state and prepped and draped  in the usual sterile fashion for Right radial or groin access. A modified Allen's test performed on the right wrist demonstrated excellent Ulnar collateral flow for radial access. Sterile technique was used including antiseptics, cap, gloves, gown, hand hygiene, mask and sheet.  Skin prep: Chlorhexidine.  Time Out: Verified patient identification, verified procedure, site/side was marked, verified correct patient position, special equipment/implants available, medications/allergies/relevent history reviewed, required imaging and test results available.  Performed  Access: Right Radial Artery; 5 Fr Glide Sheath -- Seldinger technique using Micropuncture Angiocath Kit Diagnostic:  Catheters were advanced over a Versicore wire and exchanged over long exchange safety J-wire.  First a JR 4 followed by JL 3.5  Right Coronary Artery Angiography: JR 4  Left Coronary Artery Angiography: JL 3.5  LV Hemodynamics (LV Gram): JR 4, Angled Pigtail  ANESTHESIA:   Local Lidocaine 2 ml SEDATION:  2 mg IV Versed, 50 mcg IV fentanyl  MEDICATIONS: Intra Arterial Radial Cocktail: 5 mg Verapamil, 400 mcg NTG, 2 ml 2% Lidocaine in 10 ml NS  Intracoronary NTG: 200 mcg x 2  Anticoagulation: Diagnostic - IV Heparin 3500 Units; PCI Angiomax Bolus & drip -> decreased to "Renal Dose Rate" for 2 hr post PCI.   Anti-Platelet Agent: Brilinta (Ticagrelor) 180 mg  Normal Saline Bolus - 250 ml  Hemodynamics:  Central Aortic / Mean Pressures: 106/56 mmHg; 79 mmHg  Left Ventricular Pressures / EDP: 107/1 mmHg; 11 mmHg  Left Ventriculography:  EF: 65 %  Wall Motion: Normal  Coronary Anatomy:  Left Main: Large-caliber vessel that bifurcates into the LAD and Circumflex, angiographically normal LAD: Large-caliber vessel that gives rise to a proximal diagonal  branch before coursing down around the apex perfusing the inferoapex. Minimal luminal irregularities.  D1: Moderate large-caliber vessel it covers a large  portion of the anterolateral wall, minimal luminal irregularities; they're several small branches. Left Circumflex: Large caliber vessel that trifurcates into a large lateral OM 2, with it OM1 and AV groove circumflex coming off at the proximal portion of the previously placed stent. No proximal disease.  OM1: Small to moderate caliber vessel with ostial 20% stenosis being pinched by the stent. The vessel tapers down to small vessel on the inferolateral wall.  OM 2: Large-caliber parent vessel of the circumflex that bifurcates distally into 2 smaller branches. There is maybe 10% in-stent restenosis in the previously placed bare-metal stent.  AV groove circumflex is a small-caliber vessel that gives rise to small atrial branch and small posterior lateral branch. Diffuse distal luminal regularity but nothing significant.  RCA: Large caliber dominant vessel with a proximal 80-90% stenosis just after an atrial branch. The vessel then courses down to bifurcate distally into a small posterior lateral system with essentially 2 branches of the posterior AV groove giving rise to 3 small posterior lateral branches.  The terminal RCA is a moderate caliber PDA reaching down two thirds the way to the apex. Minimal luminal irregularities.   Initial angiography, the culprit lesion was clearly the proximal to mid RCA lesion. This is was made to proceed with his intervention.   Percutaneous Coronary Intervention:  Sheath exchanged for 6 Fr  Guide: 6 Fr  JR 4  Guidewire: Pro-water   Predilation Balloon: Mini Trek 2.0  mm x 12 mm; 10 Atm x 35 Sec - mild ST segment elevations noted inferiorly with inflation resolved with deflation.   no dissection or perforation   Stent: Promus Premier 2.75  mm x 20 mm; 14 Atm x 30 Sec, 16 Atm x 30 Sec - final diameter at the edges 2.9 mm   Small distal step down noted, relieved with nitroglycerin injection into coronary   Post-dilation Balloon: [place] Sprinter 3.0  mm x 15 mm; 12  Atm x 46 Sec  Final Diameter: 3.0 mm  Post-deployment cineangiography with and without guidewire in place was performed in multiple orthogonal views demonstrating excellent stent deployment and did not reveal evidence of dissection or perforation  TR Band:  1330  Hours, 14  mL air - There was oozing from the site but occlusive hemostasis when reinflated.   EBL:   < 5 ml  PATIENT DISPOSITION:    The patient was transferred to the PACU holding area in a hemodynamicaly stable, chest pain free condition.  The patient tolerated the procedure well, and there were no complications.  The patient was stable before, during, and after the procedure.  POST-OPERATIVE DIAGNOSIS:    Severe single-vessel coronary disease in the proximal RCA  Successful PCI of the proximal RCA lesion with a Promus Premier DES stent as described  Widely patent stent in the lateral OM 2 with minimal in stent restenosis. No notable disease in the LAD system.   Well-preserved ventricular ejection fraction with normal EDP.  PLAN OF CARE:  Standard post radial cath care.   We'll continue Angiomax for 2 hours post procedure reduced rate.  Dual Antiplatelet therapy for minimal in year - aspirin and Ticagrelor  Anticipate discharge in the morning.   Marykay Lex, M.D., M.S. [facility] [address] [place], [place]  [address]  [phone]  07/15/2012 1:40 PM

## 2012-07-15 NOTE — Progress Notes (Signed)
[facility]  NAME:  [name]   MRN: [id number] DOB:  [birth year]   ADMIT DATE: 07/14/2012  Subjective: No further chest pain.  Objective: Vital signs in last 24 hours: Temp:  [97.9 F (36.6 C)-98.8 F (37.1 C)] 98.2 F (36.8 C) (01/14 0456) Pulse Rate:  [75-96] 75  (01/14 0456) Resp:  [11-16] 15  (01/14 0456) BP: (108-138)/(60-74) 130/70 mmHg (01/14 0456) SpO2:  [94 %-98 %] 94 % (01/14 0456) Weight:  [72.938 kg (160 lb 12.8 oz)-75.5 kg (166 lb 7.2 oz)] 72.938 kg (160 lb 12.8 oz) (01/14 0456) Last BM Date: 07/14/12  Intake/Output from previous day: 01/13 0701 - 01/14 0700 In: 370 [P.O.:240; I.V.:130] Out: -  Intake/Output this shift:    Medications Current Facility-Administered Medications  Medication Dose Route Frequency Provider Last Rate Last Dose  . 0.9 %  sodium chloride infusion  250 mL Intravenous PRN Wilburt Finlay, PA      . 0.9 %  sodium chloride infusion  1 mL/kg/hr Intravenous Continuous Wilburt Finlay, PA 75.5 mL/hr at 07/15/12 0533 1 mL/kg/hr at 07/15/12 0533  . acetaminophen (TYLENOL) tablet 650 mg  650 mg Oral Q4H PRN Wilburt Finlay, PA      . albuterol (PROVENTIL HFA;VENTOLIN HFA) 108 (90 BASE) MCG/ACT inhaler 2 puff  2 puff Inhalation Q4H PRN Wilburt Finlay, PA      . aspirin EC tablet 81 mg  81 mg Oral Daily Mihai Croitoru, MD      . ferrous sulfate tablet 325 mg  325 mg Oral Q breakfast Mihai Croitoru, MD      . heparin ADULT infusion 100 units/mL (25000 units/250 mL)  800 Units/hr Intravenous Continuous Mihai Croitoru, MD 8 mL/hr at 07/14/12 1752 800 Units/hr at 07/14/12 1752  . metoprolol tartrate (LOPRESSOR) tablet 25 mg  25 mg Oral BID Wilburt Finlay, PA   25 mg at 07/14/12 2143  . nitroGLYCERIN (NITROSTAT) SL tablet 0.4 mg  0.4 mg Sublingual Q5 min PRN Gwyneth Sprout, MD      . ondansetron (ZOFRAN) injection 4 mg  4 mg Intravenous Q6H PRN Wilburt Finlay, PA      . simvastatin (ZOCOR) tablet 10 mg  10 mg Oral q1800 Mihai Croitoru, MD    10 mg at 07/14/12 2143  . sodium chloride 0.9 % injection 3 mL  3 mL Intravenous Q12H Wilburt Finlay, PA   3 mL at 07/14/12 2143  . sodium chloride 0.9 % injection 3 mL  3 mL Intravenous PRN Wilburt Finlay, PA      . triamterene-hydrochlorothiazide (DYAZIDE) 37.5-25 MG per capsule 1 each  1 each Oral QAC breakfast Wilburt Finlay, PA        PE: General appearance: alert, cooperative and no distress Lungs: clear to auscultation bilaterally Heart: regular rate and rhythm, S1, S2 normal, no murmur, click, rub or gallop Extremities: No LEE Pulses: 2+ and symmetric Neurologic: Grossly normal  Lab Results:   Basename 07/14/12 1204  WBC 8.6  HGB 14.1  HCT 41.4  PLT 208   BMET  Basename 07/15/12 0430 07/14/12 1204  NA 139 141  K 3.4* 3.7  CL 101 99  CO2 24 28  GLUCOSE 120* 145*  BUN 24* 25*  CREATININE 0.78 1.00  CALCIUM 9.5 10.5   PT/INR  Basename 07/14/12 1204  LABPROT 13.7  INR 1.06   Cholesterol  Basename 07/15/12 0430  CHOL 158   Lipid Panel     Component Value Date/Time   CHOL 158 07/15/2012  0430   TRIG 99 07/15/2012 0430   HDL 90 07/15/2012 0430   CHOLHDL 1.8 07/15/2012 0430   VLDL 20 07/15/2012 0430   LDLCALC 48 07/15/2012 0430   Cardiac Panel (last 3 results)  Basename 07/15/12 0430 07/14/12 2226 07/14/12 1716  CKTOTAL -- -- --  CKMB -- -- --  TROPONINI <0.30 <0.30 <0.30  RELINDX -- -- --   Assessment/Plan  Principal Problem:  *Unstable angina Active Problems:  HYPERLIPIDEMIA  HYPERTENSION  CORONARY ARTERY DISEASE: BMS to circ OM2 in 2002 with angiopalsty for ISRS 2003.  PAtent in 2004.  Carcinoma of ascending colon  Plan:  She ruled out for MI.  Lipid panel looks great.  Triglyceride:HDL ratio is 1.0.   BP and HR stable.  For LHC today.   LOS: 1 day    HAGER, BRYAN 07/15/2012 7:59 AM  I have seen and evaluated the patient this morning along wit Wilburt Finlay, PA. I agree with his findings, examination as well as impression recommendations.   79 y/o  woman with prior PCI  & ISR related PTCA of OM2 (last cath 2004 - no PCI) p/w SSx concerning for UA - plan per Dr. Royann Shivers & patient request was to proceed with cardiac cath.  Performing MD:  [name], M.D., M.S.  Procedure:  Left Heart Catheterization with Native Coronary Angiography & possible Percutaneous Coronary Intervention.  The procedure with Risks/Benefits/Alternatives and Indications was reviewed with the patient and husband + son-in-law.  All questions were answered.    Risks / Complications include, but not limited to: Death, MI, CVA/TIA, VF/VT (with defibrillation), Bradycardia (need for temporary pacer placement), contrast induced nephropathy, bleeding / bruising / hematoma / pseudoaneurysm, vascular or coronary injury (with possible emergent CT or Vascular Surgery), adverse medication reactions, infection.    The patient (and family) voice understanding and agree to proceed.   I have signed the consent form and placed it on the chart for patient signature and RN witness.     [name], M.D., M.S. [facility] [address] [place], [place]  [address]  [phone]  07/15/2012 11:51 AM

## 2012-07-15 NOTE — Progress Notes (Signed)
Came to recheck on patient -- post cath, had notable bruising & oozing from TR band site; I removed & replaced the band ~1400, 16ml air with non-occlusive hemostasis on plethysmography.  On recheck - band is close to removal.  Pain notably improved.  Bruise stable.    No change to plan for d/c in AM.  Marykay Lex, M.D., M.S. [facility] [address] [place], [place]  [address]  [phone] Pager # [phone] 07/15/2012 6:32 PM

## 2012-07-16 DIAGNOSIS — I200 Unstable angina: Secondary | ICD-10-CM | POA: Diagnosis not present

## 2012-07-16 DIAGNOSIS — I2510 Atherosclerotic heart disease of native coronary artery without angina pectoris: Secondary | ICD-10-CM | POA: Diagnosis not present

## 2012-07-16 LAB — CBC
MCV: 91.3 fL (ref 78.0–100.0)
Platelets: 190 10*3/uL (ref 150–400)
RBC: 4 MIL/uL (ref 3.87–5.11)
RDW: 12.7 % (ref 11.5–15.5)
WBC: 8.2 10*3/uL (ref 4.0–10.5)

## 2012-07-16 LAB — BASIC METABOLIC PANEL
CO2: 23 mEq/L (ref 19–32)
Calcium: 9.5 mg/dL (ref 8.4–10.5)
Chloride: 104 mEq/L (ref 96–112)
Creatinine, Ser: 0.72 mg/dL (ref 0.50–1.10)
GFR calc Af Amer: >90 mL/min (ref >90)
Sodium: 138 mEq/L (ref 135–145)

## 2012-07-16 LAB — PRO B NATRIURETIC PEPTIDE: Pro B Natriuretic peptide (BNP): 145.4 pg/mL (ref 0–450)

## 2012-07-16 MED ORDER — NITROGLYCERIN 0.4 MG SL SUBL: 0.4000 mg | SUBLINGUAL_TABLET | SUBLINGUAL | Status: DC | PRN

## 2012-07-16 MED ORDER — LORATADINE 10 MG PO TABS
10.0000 mg | ORAL_TABLET | Freq: Every day | ORAL | Status: DC
  Filled 2012-07-16: qty 1

## 2012-07-16 MED ORDER — TICAGRELOR 90 MG PO TABS: 90.0000 mg | ORAL_TABLET | Freq: Two times a day (BID) | ORAL | Status: DC

## 2012-07-16 MED ORDER — CETIRIZINE HCL 5 MG/5ML PO SYRP: 2.5000 mg | ORAL_SOLUTION | Freq: Every day | ORAL | Status: DC

## 2012-07-16 MED FILL — Dextrose Inj 5%: INTRAVENOUS | Qty: 50 | Status: AC

## 2012-07-16 NOTE — Progress Notes (Signed)
Pt. Seen and examined. Agree with the NP/PA-C note as written.  No chest pain. Moderate hematoma over the right wrist with weak, but palpable radial pulse. Fingers warm with good CR. Mild dyspnea, but is congested, has not taken allergy medication. ?brillinta effect. No limitation to discharge. Home today. Follow-up with Dr. Alanda Amass, scheduled appointment on 1/22.  Chrystie Nose, MD, [facility] Attending Cardiologist [facility]

## 2012-07-16 NOTE — Progress Notes (Signed)
CARDIAC REHAB PHASE I   PRE:  Rate/Rhythm: 94SR  BP:  Supine:   Sitting: 137/63  Standing:    SaO2:   MODE:  Ambulation: 540 ft   POST:  Rate/Rhythem: 116ST  BP:  Supine:   Sitting: 141/69  Standing:    SaO2:  0800-0900 Pt walked 540 ft with handheld asst. Gait steady. Tolerated well. Slightly winded. No CP. To recliner after walk with call bell. Education completed with pt including encouraging her to watch carbs. HgbA1C at 6.5. Discussed CRP 2 and pt gave permission to refer to [place] Phase 2.  Duanne Limerick

## 2012-07-16 NOTE — Progress Notes (Signed)
Subjective:  SOB this am  Objective:  Vital Signs in the last 24 hours: Temp:  [97.4 F (36.3 C)-98 F (36.7 C)] 97.6 F (36.4 C) (01/15 0804) Pulse Rate:  [39-93] 93  (01/15 0804) Resp:  [14-17] 15  (01/15 0804) BP: (116-151)/(50-87) 137/63 mmHg (01/15 0804) SpO2:  [97 %-100 %] 98 % (01/15 0804) Weight:  [73.2 kg (161 lb 6 oz)] 73.2 kg (161 lb 6 oz) (01/15 0419)  Intake/Output from previous day:  Intake/Output Summary (Last 24 hours) at 07/16/12 0919 Last data filed at 07/15/12 2230  Gross per 24 hour  Intake    999 ml  Output      0 ml  Net    999 ml    Physical Exam: General appearance: alert, cooperative and no distress Lungs: clear to auscultation bilaterally Heart: regular rate and rhythm Rt wrist echymotic   Rate: 94  Rhythm: normal sinus rhythm  Lab Results:  Basename 07/16/12 0704 07/14/12 1204  WBC 8.2 8.6  HGB 12.4 14.1  PLT 190 208    Basename 07/16/12 0704 07/15/12 0430  NA 138 139  K 3.5 3.4*  CL 104 101  CO2 23 24  GLUCOSE 128* 120*  BUN 15 24*  CREATININE 0.72 0.78    Basename 07/15/12 0430 07/14/12 2226  TROPONINI <0.30 <0.30   Hepatic Function Panel No results found for this basename: PROT,ALBUMIN,AST,ALT,ALKPHOS,BILITOT,BILIDIR,IBILI in the last 72 hours  Basename 07/15/12 0430  CHOL 158    Basename 07/14/12 1204  INR 1.06    Imaging: Imaging results have been reviewed  Cardiac Studies:  Assessment/Plan:   Principal Problem:  *Unstable angina Active Problems:  CAD, OM2 HSRA 7/02, OM2 Stent 9/02, ISR 11/03. RCA DES 1 /14/14  HYPERLIPIDEMIA  HYPERTENSION  Carcinoma of ascending colon   Plan- Possible discharge later today. ? Further work up for SOB and elevated HR. She is on beta blocker, no CHF on exam. Check BNP this am.   Corine Shelter PA-C 07/16/2012, 9:19 AM

## 2012-07-16 NOTE — Discharge Summary (Signed)
Patient ID: [name],  MRN: [id number], DOB/AGE: 77-Apr-1929 77 y.o.  Admit date: 07/14/2012 Discharge date: 07/16/2012  Primary Care Provider:  Primary Cardiologist: Dr Alanda Amass  Discharge Diagnoses  Principal Problem:  *Unstable angina   Active Problems:  CAD, OM2 HSRA 7/02, OM2 Stent 9/02, ISR 11/03. RCA DES 1 /14/14  HYPERLIPIDEMIA  HYPERTENSION  Carcinoma of ascending colon  History of Plavix allergy    Procedures:  RCA DES 07/15/12   Hospital Course:   79 y/o followed by our group with a history of CAD. She had an OM2 HSRA in July 2002. In Sept 2002 she had a stent placed to the OM2. In Nov 2003 she had ISR  That was intervened on.  She had been doing well until recently. She was at her primary care's office and mentioned she had been having chest pain consistent with angina. She was admitted direct from their office. She ruled out for an MI. Cath revealed new RCA stenosis that was Rx'd with a DES. The OM2 site was patent, and she had no significant LAD disease. LVF was NL. She was cathed through her Rt wrist and she developed a hematoma with ecchymosis. On exam her distal circulation was not compromised. She had good feeling in her hand and a palpable distal pulse at discharge. She did complain of some SOB at discharge, Dr Rennis Golden feels this may be due to [facility]. For now we no other options as she is allergic to Plavix and over 79.   Discharge Vitals:  Blood pressure 137/63, pulse 93, temperature 97.6 F (36.4 C), temperature source Oral, resp. rate 15, height 5' (1.524 m), weight 73.2 kg (161 lb 6 oz), SpO2 98.00%.    Labs: Results for orders placed during the hospital encounter of 07/14/12 (from the past 48 hour(s))  CBC WITH DIFFERENTIAL     Status: Abnormal   Collection Time   07/14/12 12:04 PM      Component Value Range Comment   WBC 8.6  4.0 - 10.5 K/uL    RBC 4.53  3.87 - 5.11 MIL/uL    Hemoglobin 14.1  12.0 - 15.0 g/dL    HCT 14.7  82.9 - 56.2 %    MCV 91.4   78.0 - 100.0 fL    MCH 31.1  26.0 - 34.0 pg    MCHC 34.1  30.0 - 36.0 g/dL    RDW 13.0  86.5 - 78.4 %    Platelets 208  150 - 400 K/uL    Neutrophils Relative 81 (*) 43 - 77 %    Neutro Abs 7.0  1.7 - 7.7 K/uL    Lymphocytes Relative 12  12 - 46 %    Lymphs Abs 1.0  0.7 - 4.0 K/uL    Monocytes Relative 6  3 - 12 %    Monocytes Absolute 0.5  0.1 - 1.0 K/uL    Eosinophils Relative 1  0 - 5 %    Eosinophils Absolute 0.1  0.0 - 0.7 K/uL    Basophils Relative 0  0 - 1 %    Basophils Absolute 0.0  0.0 - 0.1 K/uL   BASIC METABOLIC PANEL     Status: Abnormal   Collection Time   07/14/12 12:04 PM      Component Value Range Comment   Sodium 141  135 - 145 mEq/L    Potassium 3.7  3.5 - 5.1 mEq/L    Chloride 99  96 - 112 mEq/L    CO2  28  19 - 32 mEq/L    Glucose, Bld 145 (*) 70 - 99 mg/dL    BUN 25 (*) 6 - 23 mg/dL    Creatinine, Ser 1.61  0.50 - 1.10 mg/dL    Calcium 09.6  8.4 - 10.5 mg/dL    GFR calc non Af Amer 51 (*) >90 mL/min    GFR calc Af Amer 59 (*) >90 mL/min   PROTIME-INR     Status: Normal   Collection Time   07/14/12 12:04 PM      Component Value Range Comment   Prothrombin Time 13.7  11.6 - 15.2 seconds    INR 1.06  0.00 - 1.49   APTT     Status: Normal   Collection Time   07/14/12 12:04 PM      Component Value Range Comment   aPTT 28  24 - 37 seconds   TROPONIN I     Status: Normal   Collection Time   07/14/12 12:05 PM      Component Value Range Comment   Troponin I <0.30  <0.30 ng/mL   TROPONIN I     Status: Normal   Collection Time   07/14/12  5:16 PM      Component Value Range Comment   Troponin I <0.30  <0.30 ng/mL   TSH     Status: Normal   Collection Time   07/14/12  5:16 PM      Component Value Range Comment   TSH 1.367  0.350 - 4.500 uIU/mL   HEMOGLOBIN A1C     Status: Abnormal   Collection Time   07/14/12  5:16 PM      Component Value Range Comment   Hemoglobin A1C 6.5 (*) <5.7 %    Mean Plasma Glucose 140 (*) <117 mg/dL   TROPONIN I     Status:  Normal   Collection Time   07/14/12 10:26 PM      Component Value Range Comment   Troponin I <0.30  <0.30 ng/mL   TROPONIN I     Status: Normal   Collection Time   07/15/12  4:30 AM      Component Value Range Comment   Troponin I <0.30  <0.30 ng/mL   HEPARIN LEVEL (UNFRACTIONATED)     Status: Normal   Collection Time   07/15/12  4:30 AM      Component Value Range Comment   Heparin Unfractionated 0.46  0.30 - 0.70 IU/mL   BASIC METABOLIC PANEL     Status: Abnormal   Collection Time   07/15/12  4:30 AM      Component Value Range Comment   Sodium 139  135 - 145 mEq/L    Potassium 3.4 (*) 3.5 - 5.1 mEq/L    Chloride 101  96 - 112 mEq/L    CO2 24  19 - 32 mEq/L    Glucose, Bld 120 (*) 70 - 99 mg/dL    BUN 24 (*) 6 - 23 mg/dL    Creatinine, Ser 0.45  0.50 - 1.10 mg/dL    Calcium 9.5  8.4 - 40.9 mg/dL    GFR calc non Af Amer 76 (*) >90 mL/min    GFR calc Af Amer 88 (*) >90 mL/min   LIPID PANEL     Status: Normal   Collection Time   07/15/12  4:30 AM      Component Value Range Comment   Cholesterol 158  0 - 200 mg/dL    Triglycerides  99  <150 mg/dL    HDL 90  >45 mg/dL    Total CHOL/HDL Ratio 1.8      VLDL 20  0 - 40 mg/dL    LDL Cholesterol 48  0 - 99 mg/dL   POCT ACTIVATED CLOTTING TIME     Status: Normal   Collection Time   07/15/12  1:01 PM      Component Value Range Comment   Activated Clotting Time 481     CBC     Status: Normal   Collection Time   07/16/12  7:04 AM      Component Value Range Comment   WBC 8.2  4.0 - 10.5 K/uL    RBC 4.00  3.87 - 5.11 MIL/uL    Hemoglobin 12.4  12.0 - 15.0 g/dL    HCT 40.9  81.1 - 91.4 %    MCV 91.3  78.0 - 100.0 fL    MCH 31.0  26.0 - 34.0 pg    MCHC 34.0  30.0 - 36.0 g/dL    RDW 78.2  95.6 - 21.3 %    Platelets 190  150 - 400 K/uL   BASIC METABOLIC PANEL     Status: Abnormal   Collection Time   07/16/12  7:04 AM      Component Value Range Comment   Sodium 138  135 - 145 mEq/L    Potassium 3.5  3.5 - 5.1 mEq/L    Chloride 104  96  - 112 mEq/L    CO2 23  19 - 32 mEq/L    Glucose, Bld 128 (*) 70 - 99 mg/dL    BUN 15  6 - 23 mg/dL    Creatinine, Ser 0.86  0.50 - 1.10 mg/dL    Calcium 9.5  8.4 - 57.8 mg/dL    GFR calc non Af Amer 78 (*) >90 mL/min    GFR calc Af Amer >90  >90 mL/min     Disposition:  Follow-up Information    Follow up with Governor Rooks, MD. On 07/23/2012. (Keep apt)    Contact information:   [address] [place] [address] [phone]          Discharge Medications:    Medication List     As of 07/16/2012 10:15 AM    TAKE these medications         acetaminophen 325 MG tablet   Commonly known as: TYLENOL   Take 650 mg by mouth every 6 (six) hours as needed. For pain      albuterol 108 (90 BASE) MCG/ACT inhaler   Commonly known as: PROVENTIL HFA;VENTOLIN HFA   Inhale 2 puffs into the lungs every 4 (four) hours as needed. For shortness of breath      aspirin 81 MG tablet   Take 81 mg by mouth daily.      CALCIUM + D PO   Take 1 tablet by mouth daily. Calcium 500mg  with Vitamin D      cetirizine 10 MG tablet   Commonly known as: ZYRTEC   Take 5 mg by mouth 2 (two) times daily.      cholecalciferol 1000 UNITS tablet   Commonly known as: VITAMIN D   Take 1,000 Units by mouth daily.      ferrous sulfate 325 (65 FE) MG EC tablet   Take 162.5 mg by mouth daily with breakfast.      lovastatin 20 MG 24 hr tablet   Commonly  known as: ALTOPREV   Take 20 mg by mouth at bedtime.      metoprolol 50 MG tablet   Commonly known as: LOPRESSOR   Take 25 mg by mouth 2 (two) times daily.      nitroGLYCERIN 0.4 MG SL tablet   Commonly known as: NITROSTAT   Place 1 tablet (0.4 mg total) under the tongue every 5 (five) minutes as needed for chest pain.      Ticagrelor 90 MG Tabs tablet   Commonly known as: BRILINTA   Take 1 tablet (90 mg total) by mouth 2 (two) times daily.      triamterene-hydrochlorothiazide 37.5-25 MG per capsule   Commonly known  as: DYAZIDE   Take 1 capsule by mouth every morning.        Duration of Discharge Encounter: Greater than 30 minutes including physician time.  Jolene Provost PA-C 07/16/2012 10:15 AM

## 2012-07-21 DIAGNOSIS — Z1231 Encounter for screening mammogram for malignant neoplasm of breast: Secondary | ICD-10-CM | POA: Diagnosis not present

## 2012-07-23 DIAGNOSIS — I10 Essential (primary) hypertension: Secondary | ICD-10-CM | POA: Diagnosis not present

## 2012-07-23 DIAGNOSIS — I2510 Atherosclerotic heart disease of native coronary artery without angina pectoris: Secondary | ICD-10-CM | POA: Diagnosis not present

## 2012-07-23 DIAGNOSIS — E782 Mixed hyperlipidemia: Secondary | ICD-10-CM | POA: Diagnosis not present

## 2012-07-24 DIAGNOSIS — I129 Hypertensive chronic kidney disease with stage 1 through stage 4 chronic kidney disease, or unspecified chronic kidney disease: Secondary | ICD-10-CM | POA: Diagnosis not present

## 2012-07-24 DIAGNOSIS — E7800 Pure hypercholesterolemia, unspecified: Secondary | ICD-10-CM | POA: Diagnosis not present

## 2012-07-24 DIAGNOSIS — I2510 Atherosclerotic heart disease of native coronary artery without angina pectoris: Secondary | ICD-10-CM | POA: Diagnosis not present

## 2012-07-24 DIAGNOSIS — D649 Anemia, unspecified: Secondary | ICD-10-CM | POA: Diagnosis not present

## 2012-07-24 DIAGNOSIS — H6120 Impacted cerumen, unspecified ear: Secondary | ICD-10-CM | POA: Diagnosis not present

## 2012-07-25 DIAGNOSIS — R6889 Other general symptoms and signs: Secondary | ICD-10-CM | POA: Diagnosis not present

## 2012-07-25 DIAGNOSIS — R5383 Other fatigue: Secondary | ICD-10-CM | POA: Diagnosis not present

## 2012-07-25 DIAGNOSIS — D649 Anemia, unspecified: Secondary | ICD-10-CM | POA: Diagnosis not present

## 2012-07-26 DIAGNOSIS — D649 Anemia, unspecified: Secondary | ICD-10-CM | POA: Diagnosis not present

## 2012-07-27 DIAGNOSIS — D649 Anemia, unspecified: Secondary | ICD-10-CM | POA: Diagnosis not present

## 2012-07-28 ENCOUNTER — Other Ambulatory Visit (HOSPITAL_COMMUNITY): Payer: Self-pay | Admitting: Cardiovascular Disease

## 2012-07-28 DIAGNOSIS — I350 Nonrheumatic aortic (valve) stenosis: Secondary | ICD-10-CM

## 2012-07-28 DIAGNOSIS — I2510 Atherosclerotic heart disease of native coronary artery without angina pectoris: Secondary | ICD-10-CM

## 2012-07-28 DIAGNOSIS — R011 Cardiac murmur, unspecified: Secondary | ICD-10-CM

## 2012-08-02 HISTORY — PX: TRANSTHORACIC ECHOCARDIOGRAM: SHX275

## 2012-08-07 ENCOUNTER — Ambulatory Visit (HOSPITAL_COMMUNITY): Payer: Medicare Other

## 2012-08-07 DIAGNOSIS — R5381 Other malaise: Secondary | ICD-10-CM | POA: Diagnosis not present

## 2012-08-07 DIAGNOSIS — R0602 Shortness of breath: Secondary | ICD-10-CM | POA: Diagnosis not present

## 2012-08-07 DIAGNOSIS — E782 Mixed hyperlipidemia: Secondary | ICD-10-CM | POA: Diagnosis not present

## 2012-08-07 DIAGNOSIS — I2510 Atherosclerotic heart disease of native coronary artery without angina pectoris: Secondary | ICD-10-CM | POA: Diagnosis not present

## 2012-08-07 DIAGNOSIS — R6889 Other general symptoms and signs: Secondary | ICD-10-CM | POA: Diagnosis not present

## 2012-08-07 DIAGNOSIS — R5383 Other fatigue: Secondary | ICD-10-CM | POA: Diagnosis not present

## 2012-08-14 ENCOUNTER — Ambulatory Visit (HOSPITAL_COMMUNITY): Payer: Medicare Other

## 2012-08-27 ENCOUNTER — Ambulatory Visit (HOSPITAL_COMMUNITY)
Admission: RE | Admit: 2012-08-27 | Discharge: 2012-08-27 | Disposition: A | Discharge status: Home / Self Care | Payer: Medicare Other | Source: Ambulatory Visit | Attending: Cardiovascular Disease | Admitting: Cardiovascular Disease

## 2012-08-27 DIAGNOSIS — I059 Rheumatic mitral valve disease, unspecified: Secondary | ICD-10-CM | POA: Insufficient documentation

## 2012-08-27 DIAGNOSIS — I379 Nonrheumatic pulmonary valve disorder, unspecified: Secondary | ICD-10-CM | POA: Diagnosis not present

## 2012-08-27 DIAGNOSIS — I369 Nonrheumatic tricuspid valve disorder, unspecified: Secondary | ICD-10-CM | POA: Diagnosis not present

## 2012-08-27 DIAGNOSIS — I10 Essential (primary) hypertension: Secondary | ICD-10-CM | POA: Insufficient documentation

## 2012-08-27 DIAGNOSIS — I2510 Atherosclerotic heart disease of native coronary artery without angina pectoris: Secondary | ICD-10-CM | POA: Insufficient documentation

## 2012-08-27 NOTE — Progress Notes (Signed)
2D Echo Performed 08/27/2012    Deangelo Berns, RCS  

## 2012-08-30 DIAGNOSIS — R918 Other nonspecific abnormal finding of lung field: Secondary | ICD-10-CM

## 2012-08-30 HISTORY — DX: Other nonspecific abnormal finding of lung field: R91.8

## 2012-09-11 DIAGNOSIS — R6889 Other general symptoms and signs: Secondary | ICD-10-CM | POA: Diagnosis not present

## 2012-09-11 DIAGNOSIS — J449 Chronic obstructive pulmonary disease, unspecified: Secondary | ICD-10-CM | POA: Diagnosis not present

## 2012-09-22 DIAGNOSIS — R0989 Other specified symptoms and signs involving the circulatory and respiratory systems: Secondary | ICD-10-CM | POA: Diagnosis not present

## 2012-09-22 DIAGNOSIS — R0609 Other forms of dyspnea: Secondary | ICD-10-CM | POA: Diagnosis not present

## 2012-09-22 LAB — PULMONARY FUNCTION TEST

## 2012-09-25 ENCOUNTER — Other Ambulatory Visit: Payer: Self-pay | Admitting: Cardiovascular Disease

## 2012-09-25 DIAGNOSIS — R0602 Shortness of breath: Secondary | ICD-10-CM

## 2012-09-25 DIAGNOSIS — J449 Chronic obstructive pulmonary disease, unspecified: Secondary | ICD-10-CM | POA: Diagnosis not present

## 2012-09-25 DIAGNOSIS — I2510 Atherosclerotic heart disease of native coronary artery without angina pectoris: Secondary | ICD-10-CM | POA: Diagnosis not present

## 2012-09-25 DIAGNOSIS — I509 Heart failure, unspecified: Secondary | ICD-10-CM

## 2012-09-26 ENCOUNTER — Ambulatory Visit
Admission: RE | Admit: 2012-09-26 | Discharge: 2012-09-26 | Disposition: A | Discharge status: Home / Self Care | Payer: Medicare Other | Source: Ambulatory Visit | Attending: Cardiovascular Disease | Admitting: Cardiovascular Disease

## 2012-09-26 DIAGNOSIS — I7789 Other specified disorders of arteries and arterioles: Secondary | ICD-10-CM | POA: Diagnosis not present

## 2012-09-26 DIAGNOSIS — R0602 Shortness of breath: Secondary | ICD-10-CM

## 2012-09-26 DIAGNOSIS — I509 Heart failure, unspecified: Secondary | ICD-10-CM

## 2012-09-26 MED ORDER — IOHEXOL 350 MG/ML SOLN: 100.0000 mL | Freq: Once | INTRAVENOUS | Status: AC | PRN

## 2012-10-10 ENCOUNTER — Ambulatory Visit
Admission: RE | Admit: 2012-10-10 | Discharge: 2012-10-10 | Disposition: A | Discharge status: Home / Self Care | Payer: Medicare Other | Source: Ambulatory Visit | Attending: Internal Medicine | Admitting: Internal Medicine

## 2012-10-10 ENCOUNTER — Other Ambulatory Visit: Payer: Self-pay | Admitting: Internal Medicine

## 2012-10-10 DIAGNOSIS — R1900 Intra-abdominal and pelvic swelling, mass and lump, unspecified site: Secondary | ICD-10-CM

## 2012-10-10 DIAGNOSIS — K7689 Other specified diseases of liver: Secondary | ICD-10-CM | POA: Diagnosis not present

## 2012-10-13 DIAGNOSIS — M75120 Complete rotator cuff tear or rupture of unspecified shoulder, not specified as traumatic: Secondary | ICD-10-CM | POA: Diagnosis not present

## 2012-10-20 DIAGNOSIS — R6889 Other general symptoms and signs: Secondary | ICD-10-CM | POA: Diagnosis not present

## 2012-10-20 DIAGNOSIS — R5381 Other malaise: Secondary | ICD-10-CM | POA: Diagnosis not present

## 2012-10-22 ENCOUNTER — Encounter (HOSPITAL_COMMUNITY): Payer: Self-pay | Admitting: Emergency Medicine

## 2012-10-22 ENCOUNTER — Inpatient Hospital Stay (HOSPITAL_COMMUNITY)
Admission: EM | Admit: 2012-10-22 | Discharge: 2012-10-24 | DRG: 378 | Disposition: A | Discharge status: Home / Self Care | Payer: Medicare Other | Attending: Internal Medicine | Admitting: Internal Medicine

## 2012-10-22 DIAGNOSIS — I252 Old myocardial infarction: Secondary | ICD-10-CM

## 2012-10-22 DIAGNOSIS — Z7902 Long term (current) use of antithrombotics/antiplatelets: Secondary | ICD-10-CM

## 2012-10-22 DIAGNOSIS — M129 Arthropathy, unspecified: Secondary | ICD-10-CM | POA: Diagnosis present

## 2012-10-22 DIAGNOSIS — K922 Gastrointestinal hemorrhage, unspecified: Secondary | ICD-10-CM | POA: Diagnosis not present

## 2012-10-22 DIAGNOSIS — N179 Acute kidney failure, unspecified: Secondary | ICD-10-CM | POA: Diagnosis not present

## 2012-10-22 DIAGNOSIS — E669 Obesity, unspecified: Secondary | ICD-10-CM | POA: Diagnosis present

## 2012-10-22 DIAGNOSIS — Z7982 Long term (current) use of aspirin: Secondary | ICD-10-CM | POA: Diagnosis not present

## 2012-10-22 DIAGNOSIS — E785 Hyperlipidemia, unspecified: Secondary | ICD-10-CM | POA: Diagnosis present

## 2012-10-22 DIAGNOSIS — J45909 Unspecified asthma, uncomplicated: Secondary | ICD-10-CM | POA: Diagnosis not present

## 2012-10-22 DIAGNOSIS — D649 Anemia, unspecified: Secondary | ICD-10-CM | POA: Diagnosis present

## 2012-10-22 DIAGNOSIS — D500 Iron deficiency anemia secondary to blood loss (chronic): Secondary | ICD-10-CM

## 2012-10-22 DIAGNOSIS — R195 Other fecal abnormalities: Secondary | ICD-10-CM

## 2012-10-22 DIAGNOSIS — Z85038 Personal history of other malignant neoplasm of large intestine: Secondary | ICD-10-CM | POA: Diagnosis not present

## 2012-10-22 DIAGNOSIS — R634 Abnormal weight loss: Secondary | ICD-10-CM | POA: Diagnosis present

## 2012-10-22 DIAGNOSIS — Z9861 Coronary angioplasty status: Secondary | ICD-10-CM

## 2012-10-22 DIAGNOSIS — I2510 Atherosclerotic heart disease of native coronary artery without angina pectoris: Secondary | ICD-10-CM | POA: Diagnosis not present

## 2012-10-22 DIAGNOSIS — I5032 Chronic diastolic (congestive) heart failure: Secondary | ICD-10-CM | POA: Diagnosis present

## 2012-10-22 DIAGNOSIS — M810 Age-related osteoporosis without current pathological fracture: Secondary | ICD-10-CM | POA: Diagnosis present

## 2012-10-22 DIAGNOSIS — I129 Hypertensive chronic kidney disease with stage 1 through stage 4 chronic kidney disease, or unspecified chronic kidney disease: Secondary | ICD-10-CM | POA: Diagnosis not present

## 2012-10-22 DIAGNOSIS — Z87891 Personal history of nicotine dependence: Secondary | ICD-10-CM

## 2012-10-22 DIAGNOSIS — R5381 Other malaise: Secondary | ICD-10-CM | POA: Diagnosis not present

## 2012-10-22 DIAGNOSIS — I509 Heart failure, unspecified: Secondary | ICD-10-CM | POA: Diagnosis present

## 2012-10-22 DIAGNOSIS — I10 Essential (primary) hypertension: Secondary | ICD-10-CM | POA: Diagnosis present

## 2012-10-22 DIAGNOSIS — Z96659 Presence of unspecified artificial knee joint: Secondary | ICD-10-CM | POA: Diagnosis not present

## 2012-10-22 DIAGNOSIS — R918 Other nonspecific abnormal finding of lung field: Secondary | ICD-10-CM | POA: Diagnosis present

## 2012-10-22 DIAGNOSIS — N183 Chronic kidney disease, stage 3 unspecified: Secondary | ICD-10-CM | POA: Diagnosis not present

## 2012-10-22 DIAGNOSIS — Z79899 Other long term (current) drug therapy: Secondary | ICD-10-CM | POA: Diagnosis not present

## 2012-10-22 DIAGNOSIS — Z96649 Presence of unspecified artificial hip joint: Secondary | ICD-10-CM

## 2012-10-22 DIAGNOSIS — K921 Melena: Secondary | ICD-10-CM | POA: Diagnosis not present

## 2012-10-22 LAB — CBC WITH DIFFERENTIAL/PLATELET
Basophils Absolute: 0 10*3/uL (ref 0.0–0.1)
Lymphocytes Relative: 14 % (ref 12–46)
Lymphs Abs: 1.5 10*3/uL (ref 0.7–4.0)
Neutrophils Relative %: 76 % (ref 43–77)
Platelets: 404 10*3/uL — ABNORMAL HIGH (ref 150–400)
RBC: 3.25 MIL/uL — ABNORMAL LOW (ref 3.87–5.11)
WBC: 10.8 10*3/uL — ABNORMAL HIGH (ref 4.0–10.5)

## 2012-10-22 LAB — URINALYSIS, ROUTINE W REFLEX MICROSCOPIC
Glucose, UA: NEGATIVE mg/dL
Hgb urine dipstick: NEGATIVE
Protein, ur: NEGATIVE mg/dL
Specific Gravity, Urine: 1.02 (ref 1.005–1.030)
pH: 7 (ref 5.0–8.0)

## 2012-10-22 LAB — COMPREHENSIVE METABOLIC PANEL
ALT: 12 U/L (ref 0–35)
AST: 19 U/L (ref 0–37)
Alkaline Phosphatase: 60 U/L (ref 39–117)
CO2: 25 mEq/L (ref 19–32)
GFR calc Af Amer: 43 mL/min — ABNORMAL LOW (ref >90)
Glucose, Bld: 125 mg/dL — ABNORMAL HIGH (ref 70–99)
Potassium: 4 mEq/L (ref 3.5–5.1)
Sodium: 138 mEq/L (ref 135–145)
Total Protein: 7.7 g/dL (ref 6.0–8.3)

## 2012-10-22 LAB — PREPARE RBC (CROSSMATCH)

## 2012-10-22 LAB — RETICULOCYTES: Retic Ct Pct: 2.8 % (ref 0.4–3.1)

## 2012-10-22 LAB — URINE MICROSCOPIC-ADD ON

## 2012-10-22 LAB — OCCULT BLOOD, POC DEVICE: Fecal Occult Bld: POSITIVE — AB

## 2012-10-22 MED ORDER — ONDANSETRON HCL 4 MG PO TABS: 4.0000 mg | ORAL_TABLET | Freq: Four times a day (QID) | ORAL | Status: DC | PRN

## 2012-10-22 MED ORDER — ACETAMINOPHEN 650 MG RE SUPP
650.0000 mg | Freq: Four times a day (QID) | RECTAL | Status: DC | PRN
  Administered 2012-10-23: 650 mg via RECTAL
  Filled 2012-10-22: qty 1

## 2012-10-22 MED ORDER — FUROSEMIDE 10 MG/ML IJ SOLN
20.0000 mg | Freq: Once | INTRAMUSCULAR | Status: AC
  Administered 2012-10-23: 20 mg via INTRAVENOUS
  Filled 2012-10-22: qty 2

## 2012-10-22 MED ORDER — ALBUTEROL SULFATE HFA 108 (90 BASE) MCG/ACT IN AERS
2.0000 | INHALATION_SPRAY | RESPIRATORY_TRACT | Status: DC | PRN
  Filled 2012-10-22: qty 6.7

## 2012-10-22 MED ORDER — NITROGLYCERIN 0.4 MG SL SUBL: 0.4000 mg | SUBLINGUAL_TABLET | SUBLINGUAL | Status: DC | PRN

## 2012-10-22 MED ORDER — ACETAMINOPHEN 325 MG PO TABS
650.0000 mg | ORAL_TABLET | Freq: Four times a day (QID) | ORAL | Status: DC | PRN
  Administered 2012-10-24: 650 mg via ORAL
  Filled 2012-10-22: qty 2

## 2012-10-22 MED ORDER — SODIUM CHLORIDE 0.9 % IV BOLUS (SEPSIS)
1000.0000 mL | Freq: Once | INTRAVENOUS | Status: AC
  Administered 2012-10-22: 1000 mL via INTRAVENOUS

## 2012-10-22 MED ORDER — METOPROLOL TARTRATE 25 MG PO TABS
25.0000 mg | ORAL_TABLET | Freq: Two times a day (BID) | ORAL | Status: DC
  Administered 2012-10-22 – 2012-10-24 (×4): 25 mg via ORAL
  Filled 2012-10-22 (×5): qty 1

## 2012-10-22 MED ORDER — ONDANSETRON HCL 4 MG/2ML IJ SOLN: 4.0000 mg | Freq: Four times a day (QID) | INTRAMUSCULAR | Status: DC | PRN

## 2012-10-22 MED ORDER — LOVASTATIN 20 MG PO TABS
20.0000 mg | ORAL_TABLET | Freq: Every day | ORAL | Status: DC
  Administered 2012-10-22 – 2012-10-23 (×2): 20 mg via ORAL
  Filled 2012-10-22 (×3): qty 1

## 2012-10-22 MED ORDER — PANTOPRAZOLE SODIUM 40 MG IV SOLR
40.0000 mg | Freq: Two times a day (BID) | INTRAVENOUS | Status: DC
  Administered 2012-10-23: 40 mg via INTRAVENOUS
  Filled 2012-10-22 (×3): qty 40

## 2012-10-22 MED ORDER — SODIUM CHLORIDE 0.9 % IJ SOLN
3.0000 mL | Freq: Two times a day (BID) | INTRAMUSCULAR | Status: DC
  Administered 2012-10-22 – 2012-10-24 (×2): 3 mL via INTRAVENOUS

## 2012-10-22 MED ORDER — LORATADINE 10 MG PO TABS
10.0000 mg | ORAL_TABLET | Freq: Every day | ORAL | Status: DC
  Administered 2012-10-23 – 2012-10-24 (×2): 10 mg via ORAL
  Filled 2012-10-22 (×3): qty 1

## 2012-10-22 MED ORDER — VITAMIN D3 25 MCG (1000 UNIT) PO TABS
1000.0000 [IU] | ORAL_TABLET | Freq: Every day | ORAL | Status: DC
  Administered 2012-10-22 – 2012-10-24 (×3): 1000 [IU] via ORAL
  Filled 2012-10-22 (×3): qty 1

## 2012-10-22 MED ORDER — SODIUM CHLORIDE 0.9 % IV SOLN
INTRAVENOUS | Status: DC
  Administered 2012-10-22: 20 mL via INTRAVENOUS
  Administered 2012-10-23: 10:00:00 via INTRAVENOUS

## 2012-10-22 NOTE — ED Notes (Signed)
Started plavix march 28 from cardiac stent in jan

## 2012-10-22 NOTE — ED Provider Notes (Signed)
History     CSN: [id number]  Arrival date & time 10/22/12  1312   First MD Initiated Contact with Patient 10/22/12 1619      Chief Complaint  Patient presents with  . Abdominal Pain    (Consider location/radiation/quality/duration/timing/severity/associated sxs/prior treatment) HPI Comments: Patient is an 79 year old female who presents for dark stool x2-3 weeks. Patient states her stool has been black in color and normal in consistency intermittently since the end of March when she started Plavix. Patient is on Plavix for a cardiac stent that was placed in January during cardiac cath for chest pain. Patient denies any aggravating or alleviating factors of her symptoms. Patient had a follow up with her cardiologist 2 days ago and was found to have a hemoglobin of 8.4; patient followed up with her primary care provider this morning who told her to come to the emergency department. Patient admits to intermittent shortness of breath. She denies fever, lightheadedness, dizziness, syncope, chest pain, nausea or vomiting, abdominal pain, urinary symptoms, and numbness or tingling in her extremities. Patient admits to a drop in her hemoglobin to 7 in the past with rectal bleeding; patient was found to have ulcerated polyps on colonoscopy. PCP - Dr. Pete Glatter, Cardiologist - Dr. Alanda Amass, GI - Dr. Carolan Clines (sp?)  Patient is a 79 y.o. female presenting with abdominal pain. The history is provided by the patient. No language interpreter was used.  Abdominal Pain Associated symptoms: fatigue and shortness of breath   Associated symptoms: no chest pain, no constipation, no diarrhea, no fever and no nausea     Past Medical History  Diagnosis Date  . Arthritis   . Asthma   . Adenomatous colon polyp MAY 2013    HOSPITALIZED AT [facility] WITH HGB 7 -TRANSFUSED-MASS FOUND IN ASCENDING COLON  . Diverticulosis   . HLD (hyperlipidemia)   . Obesity   . Internal hemorrhoids   . Osteoporosis   . Anemia   .  Blood transfusion MAY 2013  . Cancer   . Heart attack 01/01/2001    AUG 2002 HEART STENT PLACEMENT  . Hypertension   . Complication of anesthesia 2009    HIP REPLACEMENT-PT HAD HARD TIME WAKING UP--FELT LIKE SHE COULDN'T BREATHE  . CAD (coronary artery disease)     "SINGLE VESSEL CORONARY DISEASE WITH CX-OM-2 STENTING IN 2002 AND CARDIAC BALLOON ATHERECTOMY FOR IN-STENT RESTENOSIS IN 2003 ICH WAS WIDELY PATENT ON CATHETERIZATION IN 2004 " - PER CARDIOLOGY OFFICE NOTE 11/23/11 FROM DR. Alanda Amass    Past Surgical History  Procedure Laterality Date  . Appendectomy    . Cholecystectomy    . Coronary angioplasty with stent placement    . Colonoscopy w/ biopsies and polypectomy  06/15/2008    adenomatous polyps, diverticulosis, internal hemorrhoids  . Total hip arthroplasty  09/08/2007    left, Dr. Lequita Halt  . Colonoscopy  11/16/2011    Procedure: COLONOSCOPY;  Surgeon: Iva Boop, MD;  Location: [facility] ENDOSCOPY;  Service: Endoscopy;  Laterality: N/A;  . Knee replacement      left  . Joint replacement      RT HIP 2003  L KNEE 2003  LEFT HIP 2009    Family History  Problem Relation Age of Onset  . Pancreatic cancer Father   . Cancer Father     prostate  . Pancreatic cancer Brother   . Cancer Brother     colon  . Heart disease Mother   . Heart disease Sister   . Colon cancer Brother     ?  Colostomy  . Cancer Brother     prostate    History  Substance Use Topics  . Smoking status: Former Smoker -- 30 years    Types: Cigarettes  . Smokeless tobacco: Former Neurosurgeon    Quit date: 11/23/1982  . Alcohol Use: Yes     Comment: occ wine    OB History   Grav Para Term Preterm Abortions TAB SAB Ect Mult Living                  Review of Systems  Constitutional: Positive for fatigue. Negative for fever.  Respiratory: Positive for shortness of breath.   Cardiovascular: Negative for chest pain.  Gastrointestinal: Negative for nausea, abdominal pain, diarrhea and constipation.        +melena  Skin: Negative for color change.  Neurological: Negative for dizziness, syncope, weakness and light-headedness.  All other systems reviewed and are negative.    Allergies  Atorvastatin; Clopidogrel bisulfate; Codeine; and Sulfonamide derivatives  Home Medications   Current Outpatient Rx  Name  Route  Sig  Dispense  Refill  . acetaminophen (TYLENOL) 325 MG tablet   Oral   Take 650 mg by mouth every 6 (six) hours as needed. For pain         . albuterol (PROVENTIL HFA;VENTOLIN HFA) 108 (90 BASE) MCG/ACT inhaler   Inhalation   Inhale 2 puffs into the lungs every 4 (four) hours as needed. For shortness of breath         . aspirin 81 MG tablet   Oral   Take 81 mg by mouth daily.         . Calcium Carbonate-Vitamin D (CALCIUM + D PO)   Oral   Take 1 tablet by mouth daily. Calcium 500mg  with Vitamin D         . cetirizine (ZYRTEC) 10 MG tablet   Oral   Take 5 mg by mouth 2 (two) times daily.          . cholecalciferol (VITAMIN D) 1000 UNITS tablet   Oral   Take 1,000 Units by mouth daily.         . ferrous sulfate 325 (65 FE) MG EC tablet   Oral   Take 162.5 mg by mouth daily with breakfast.          . lovastatin (ALTOPREV) 20 MG 24 hr tablet   Oral   Take 20 mg by mouth at bedtime.         . metoprolol (LOPRESSOR) 50 MG tablet   Oral   Take 25 mg by mouth 2 (two) times daily.          . nitroGLYCERIN (NITROSTAT) 0.4 MG SL tablet   Sublingual   Place 1 tablet (0.4 mg total) under the tongue every 5 (five) minutes as needed for chest pain.   25 tablet   2   . Ticagrelor (BRILINTA) 90 MG TABS tablet   Oral   Take 1 tablet (90 mg total) by mouth 2 (two) times daily.   60 tablet   11   . triamterene-hydrochlorothiazide (DYAZIDE) 37.5-25 MG per capsule   Oral   Take 1 capsule by mouth every morning.           BP 139/56  Pulse 96  Temp(Src) 98.3 F (36.8 C)  Resp 16  SpO2 100%  Physical Exam  Nursing note and vitals  reviewed. Constitutional: She is oriented to person, place, and time. She appears well-developed and well-nourished. No  distress.  HENT:  Head: Normocephalic and atraumatic.  Mouth/Throat: Oropharynx is clear and moist. No oropharyngeal exudate.  Eyes: Conjunctivae and EOM are normal. Pupils are equal, round, and reactive to light. No scleral icterus.  Neck: Normal range of motion. Neck supple.  Cardiovascular: Normal rate, regular rhythm and normal heart sounds.   Pulmonary/Chest: Effort normal and breath sounds normal. No respiratory distress. She has no wheezes. She has no rales.  Abdominal: Soft. Bowel sounds are normal. She exhibits no distension. There is no tenderness. There is no rebound and no guarding.  Genitourinary:  Dark brown stool appreciated on rectal exam without gross blood; normal rectal tone with small amount of formed stool in rectal vault.  Musculoskeletal: Normal range of motion. She exhibits no edema.  Lymphadenopathy:    She has no cervical adenopathy.  Neurological: She is alert and oriented to person, place, and time.  Skin: Skin is warm and dry. No rash noted. She is not diaphoretic. No erythema.  Psychiatric: She has a normal mood and affect. Her behavior is normal.    ED Course  Procedures (including critical care time)  Labs Reviewed  CBC WITH DIFFERENTIAL - Abnormal; Notable for the following:    WBC 10.8 (*)    RBC 3.25 (*)    Hemoglobin 8.4 (*)    HCT 26.4 (*)    MCH 25.8 (*)    Platelets 404 (*)    Neutro Abs 8.2 (*)    All other components within normal limits  COMPREHENSIVE METABOLIC PANEL - Abnormal; Notable for the following:    Glucose, Bld 125 (*)    BUN 29 (*)    Creatinine, Ser 1.30 (*)    GFR calc non Af Amer 37 (*)    GFR calc Af Amer 43 (*)    All other components within normal limits  URINALYSIS, ROUTINE W REFLEX MICROSCOPIC - Abnormal; Notable for the following:    APPearance CLOUDY (*)    Leukocytes, UA LARGE (*)    All other  components within normal limits  URINE MICROSCOPIC-ADD ON - Abnormal; Notable for the following:    Squamous Epithelial / LPF MANY (*)    Bacteria, UA FEW (*)    All other components within normal limits  OCCULT BLOOD, POC DEVICE - Abnormal; Notable for the following:    Fecal Occult Bld POSITIVE (*)    All other components within normal limits  URINE CULTURE  AMYLASE  OCCULT BLOOD X 1 CARD TO LAB, STOOL  TYPE AND SCREEN   No results found.   1. Anemia   2. Guaiac positive stools     MDM  Patient with hx of CAD with stent placement in Jan, on Plavix since 08/2012, presents from PCP office for evaluation of low Hgb level. Patient states she had cardiology f/u 2 days ago and had bloodwork done which was significant for Hbg of 8.4. On exam, patient is pale, but nontoxic appearing. No TTP of abdomen elicited on physical exam; rectal exam significant for heme positive stool. Labs significant for Hgb of 8.4. Will consult hospitalist for admission given significant drop in Hgb (from usual 13-14) to 8.4 today. Patient without complaints at this time, hemodynamically stable without tachycardia or tachypnea.  Have spoken with Dr. Gwenlyn Perking who will admit the patient for further workup and evaluation of anemia and heme positive stool; telemetry bed, team 5. Will continue to monitor until brought to the floor.      Antony Madura, PA-C 10/23/12 [phone]

## 2012-10-22 NOTE — ED Notes (Signed)
Kelly PA at bedside   

## 2012-10-22 NOTE — H&P (Signed)
[facility] History and Physical  [name] ZOX:[id number] DOB: Feb 10, 1928 DOA: 10/22/2012  Referring physician: Dr. Devoria Albe PCP: Ginette Otto, MD  Specialists: Dr. Alanda Amass (cardiology) Dr. Leone Payor (GI)  Chief Complaint: fatigue, weakness and black stool  HPI: [name] is a 79 y.o. female with PMH significant for asthma, HLD, HTN, CAD initially on brilanta and recently changed to plavix; came to ED complaining of increase weakness, fatigue and black stool. Patient reports that after she was transitioned to plavix in March her stool has been black and she has progressively over this time feeling weaker and fatigue. Some associated SOB especially on exertion (but per patient not totally new, as she experienced even mor symptomatic SOB while on brilanta). In ED Hgb found to 8.4 (down from 12.7) and with positive FOBT. TRH called to admit for further evaluation and treatment.  Patient denies CP, fever, chills, nausea, vomiting, abdominal pain, dysuria or any other complaints.  Review of Systems:  Negative except as mentioned on HPI.  Past Medical History  Diagnosis Date  . Arthritis   . Asthma   . Adenomatous colon polyp MAY 2013    HOSPITALIZED AT [facility] WITH HGB 7 -TRANSFUSED-MASS FOUND IN ASCENDING COLON  . Diverticulosis   . HLD (hyperlipidemia)   . Obesity   . Internal hemorrhoids   . Osteoporosis   . Anemia   . Blood transfusion MAY 2013  . Cancer   . Heart attack 01/01/2001    AUG 2002 HEART STENT PLACEMENT  . Hypertension   . Complication of anesthesia 2009    HIP REPLACEMENT-PT HAD HARD TIME WAKING UP--FELT LIKE SHE COULDN'T BREATHE  . CAD (coronary artery disease)     "SINGLE VESSEL CORONARY DISEASE WITH CX-OM-2 STENTING IN 2002 AND CARDIAC BALLOON ATHERECTOMY FOR IN-STENT RESTENOSIS IN 2003 ICH WAS WIDELY PATENT ON CATHETERIZATION IN 2004 " - PER CARDIOLOGY OFFICE NOTE 11/23/11 FROM DR. Alanda Amass   Past Surgical History  Procedure Laterality Date   . Appendectomy    . Cholecystectomy    . Coronary angioplasty with stent placement    . Colonoscopy w/ biopsies and polypectomy  06/15/2008    adenomatous polyps, diverticulosis, internal hemorrhoids  . Total hip arthroplasty  09/08/2007    left, Dr. Lequita Halt  . Colonoscopy  11/16/2011    Procedure: COLONOSCOPY;  Surgeon: Iva Boop, MD;  Location: [facility] ENDOSCOPY;  Service: Endoscopy;  Laterality: N/A;  . Knee replacement      left  . Joint replacement      RT HIP 2003  L KNEE 2003  LEFT HIP 2009   Social History:  reports that she has quit smoking. Her smoking use included Cigarettes. She smoked 0.00 packs per day for 30 years. She quit smokeless tobacco use about 29 years ago. She reports that  drinks alcohol. She reports that she does not use illicit drugs.   Allergies  Allergen Reactions  . Atorvastatin Shortness Of Breath  . Brilinta (Ticagrelor) Shortness Of Breath  . Clopidogrel Bisulfate Shortness Of Breath  . Codeine Nausea And Vomiting  . Sulfonamide Derivatives Other (See Comments)    Intestinal upset    Family History  Problem Relation Age of Onset  . Pancreatic cancer Father   . Cancer Father     prostate  . Pancreatic cancer Brother   . Cancer Brother     colon  . Heart disease Mother   . Heart disease Sister   . Colon cancer Brother     ?  Colostomy  . Cancer Brother     prostate    Prior to Admission medications   Medication Sig Start Date End Date Taking? Authorizing Provider  acetaminophen (TYLENOL) 325 MG tablet Take 650 mg by mouth every 6 (six) hours as needed. For pain    Historical Provider, MD  albuterol (PROVENTIL HFA;VENTOLIN HFA) 108 (90 BASE) MCG/ACT inhaler Inhale 2 puffs into the lungs every 4 (four) hours as needed. For shortness of breath    Historical Provider, MD  aspirin 81 MG tablet Take 81 mg by mouth daily.    Historical Provider, MD  Calcium Carbonate-Vitamin D (CALCIUM + D PO) Take 1 tablet by mouth daily. Calcium 500mg  with  Vitamin D    Historical Provider, MD  cetirizine (ZYRTEC) 10 MG tablet Take 5 mg by mouth 2 (two) times daily.     Historical Provider, MD  cholecalciferol (VITAMIN D) 1000 UNITS tablet Take 1,000 Units by mouth daily.    Historical Provider, MD  ferrous sulfate 325 (65 FE) MG EC tablet Take 162.5 mg by mouth daily with breakfast.  11/16/11 11/15/12  Lonia Blood, MD  lovastatin (ALTOPREV) 20 MG 24 hr tablet Take 20 mg by mouth at bedtime.    Historical Provider, MD  metoprolol (LOPRESSOR) 50 MG tablet Take 25 mg by mouth 2 (two) times daily.     Historical Provider, MD  nitroGLYCERIN (NITROSTAT) 0.4 MG SL tablet Place 1 tablet (0.4 mg total) under the tongue every 5 (five) minutes as needed for chest pain. 07/16/12   Abelino Derrick, PA-C  Ticagrelor (BRILINTA) 90 MG TABS tablet Take 1 tablet (90 mg total) by mouth 2 (two) times daily. 07/16/12   Abelino Derrick, PA-C  triamterene-hydrochlorothiazide (DYAZIDE) 37.5-25 MG per capsule Take 1 capsule by mouth every morning.    Historical Provider, MD   Physical Exam: Filed Vitals:   10/22/12 1629 10/22/12 1630 10/22/12 1645 10/22/12 1700  BP: 152/64 151/59 144/56 139/56  Pulse: 95 96 94 96  Temp: 98.3 F (36.8 C)     Resp: 16 16 12 16   SpO2: 100% 99% 99% 100%   GEN: NAD, able to speak in full sentences, pale on exam. Head: Normocephalic and atraumatic.  Mouth/Throat: Oropharynx is clear and moist. No oropharyngeal exudate.  Eyes: Conjunctivae and EOM are normal. Pupils are equal, round, and reactive to light. No scleral icterus. Pale conjunctiva  Neck: Normal range of motion. Neck supple.  Cardiovascular: Normal rate, regular rhythm and normal heart sounds.  Pulmonary/Chest: Effort normal and breath sounds normal. No respiratory distress. She has no wheezes. She has no rales.  Abdominal: Soft. Bowel sounds are normal. She exhibits no distension. There is no tenderness. There is no rebound and no guarding.  Rectal exam per ED physician: Dark  brown stool without gross blood; normal rectal tone with small amount of formed stool in rectal vault. Positive FOBT  Musculoskeletal: Normal range of motion. She exhibits no edema.  Neurological: AAOX3, CN intact, no focal motor or sensory deficit.   Labs on Admission:  Basic Metabolic Panel:  Recent Labs Lab 10/22/12 1327  NA 138  K 4.0  CL 102  CO2 25  GLUCOSE 125*  BUN 29*  CREATININE 1.30*  CALCIUM 10.1   Liver Function Tests:  Recent Labs Lab 10/22/12 1327  AST 19  ALT 12  ALKPHOS 60  BILITOT 0.7  PROT 7.7  ALBUMIN 3.9    Recent Labs Lab 10/22/12 1327  AMYLASE 94   CBC:  Recent Labs Lab 10/22/12 1327  WBC 10.8*  NEUTROABS 8.2*  HGB 8.4*  HCT 26.4*  MCV 81.2  PLT 404*   BNP (last 3 results)  Recent Labs  07/16/12 0704  PROBNP 145.4    Assessment/Plan 1-GI bleed: patient with black stools for 2-3 weeks; also with positive FOBT in ED and decrease in her Hgb from 12.6 down to 8.4 -hemodynamically stable and with GI bleed for 2-3 weeks now. Will admit to telemetry -transfuse 1 unit of PRBC -CBC q12h -GI consulted for colonoscopy/EGD -CLD -IV protonix  2-HYPERLIPIDEMIA: check lipid panel and continue statins  3-HYPERTENSION:stable. Will discontinue lisinopril and HCTZ; continue metoprolol.   4-CAD, OM2 HSRA 7/02, OM2 Stent 9/02, ISR 11/03. RCA DES 1 /14/14: will hold plavix and ASA due to GI bleed. Cardiology consulted for guidance after stopping meds and after bleeding is controlled. -continue B-blocker and statins   5-Anemia due blood loss: will check anemia panel. transfuse 1 unit PRBC and follow GI rec's.  6-Asthma: stable. No wheezing. Continue PRN albuterol  7-Acute renal failure: secondary to decrease perfusion with anemia and continue use of HCTZ and lisinopril. -will check UA -IVF's -follow BMET in am  8-Grade 1 diastolic CHF: compensated. Will follow daily weight and strict intake and output. Last EF was 55-60% on  08/2012  DVT: SCD's    GI (Dr. Rhea Belton) [facility] (Dr. Tresa Endo)  Code Status: Full Family Communication: no family admission Disposition Plan: Admit to inpatient, LOS > 2 midnights   Time spent: >30 minutes  Azalee Weimer [facility] Pager [phone]  If 7PM-7AM, please contact night-coverage [email or web address] Password [facility] 10/22/2012, 6:49 PM

## 2012-10-22 NOTE — ED Notes (Signed)
Has had dark stools for 2-3 weeks went to dr Pete Glatter and was sent here for low hbg today 8.4 from 13.3

## 2012-10-22 NOTE — ED Provider Notes (Signed)
The patient reports she had a cardiac stent placed in January. She was on Berlinta but states it made her feel bad. She states mainly it made her short of breath. She states in March her cardiologist Dr. Alanda Amass changed her to Plavix. She relates she has been having dark bowel movements for weeks which she thought was a normal expected side effect of the Plavix. She also states she's had fatigue. She denies abdominal pain. She states she feels weak without dizziness. She also is having dyspnea on exertion. She reports in June last year she was diagnosed with colon cancer had a bleeding polyp.   Patient is noted to be pale with pale sclera.   Medical screening examination/treatment/procedure(s) were conducted as a shared visit with non-physician practitioner(s) and myself.  I personally evaluated the patient during the encounter  Devoria Albe, MD, Franz Dell, MD 10/22/12 [phone]

## 2012-10-22 NOTE — ED Notes (Signed)
Pt came from PCP for Hemoglobin of 8.4. Pt has been having dark tarry stools for 2-3wks. Pt also c/o SOB, cold intolerance, denies lightheadedness, dizziness, and chest pain. Pt started on plavix in March.

## 2012-10-22 NOTE — ED Notes (Signed)
Hospitalist at bedside 

## 2012-10-23 ENCOUNTER — Encounter (HOSPITAL_COMMUNITY): Payer: Self-pay | Admitting: Physician Assistant

## 2012-10-23 ENCOUNTER — Telehealth: Payer: Self-pay

## 2012-10-23 ENCOUNTER — Encounter (HOSPITAL_COMMUNITY)
Admission: EM | Disposition: A | Discharge status: Home / Self Care | Payer: Self-pay | Source: Home / Self Care | Attending: Internal Medicine

## 2012-10-23 DIAGNOSIS — I10 Essential (primary) hypertension: Secondary | ICD-10-CM

## 2012-10-23 DIAGNOSIS — I2510 Atherosclerotic heart disease of native coronary artery without angina pectoris: Secondary | ICD-10-CM | POA: Diagnosis not present

## 2012-10-23 DIAGNOSIS — R195 Other fecal abnormalities: Secondary | ICD-10-CM | POA: Diagnosis not present

## 2012-10-23 DIAGNOSIS — K922 Gastrointestinal hemorrhage, unspecified: Secondary | ICD-10-CM | POA: Diagnosis not present

## 2012-10-23 DIAGNOSIS — D509 Iron deficiency anemia, unspecified: Secondary | ICD-10-CM

## 2012-10-23 DIAGNOSIS — D649 Anemia, unspecified: Secondary | ICD-10-CM | POA: Diagnosis not present

## 2012-10-23 DIAGNOSIS — D500 Iron deficiency anemia secondary to blood loss (chronic): Secondary | ICD-10-CM

## 2012-10-23 DIAGNOSIS — J45909 Unspecified asthma, uncomplicated: Secondary | ICD-10-CM | POA: Diagnosis not present

## 2012-10-23 DIAGNOSIS — R634 Abnormal weight loss: Secondary | ICD-10-CM

## 2012-10-23 HISTORY — PX: ESOPHAGOGASTRODUODENOSCOPY: SHX5428

## 2012-10-23 LAB — URINE CULTURE

## 2012-10-23 LAB — IRON AND TIBC
Iron: 11 ug/dL — ABNORMAL LOW (ref 42–135)
TIBC: 480 ug/dL — ABNORMAL HIGH (ref 250–470)

## 2012-10-23 LAB — LIPID PANEL
HDL: 67 mg/dL (ref >39)
Total CHOL/HDL Ratio: 1.8 RATIO
Triglycerides: 116 mg/dL (ref <150)

## 2012-10-23 LAB — TYPE AND SCREEN: Donor AG Type: NEGATIVE

## 2012-10-23 LAB — BASIC METABOLIC PANEL
CO2: 27 mEq/L (ref 19–32)
Chloride: 100 mEq/L (ref 96–112)
Creatinine, Ser: 1.03 mg/dL (ref 0.50–1.10)
Potassium: 3.4 mEq/L — ABNORMAL LOW (ref 3.5–5.1)

## 2012-10-23 LAB — CBC
HCT: 26.6 % — ABNORMAL LOW (ref 36.0–46.0)
MCV: 79.9 fL (ref 78.0–100.0)
Platelets: 315 10*3/uL (ref 150–400)
RBC: 3.33 MIL/uL — ABNORMAL LOW (ref 3.87–5.11)
WBC: 8.9 10*3/uL (ref 4.0–10.5)

## 2012-10-23 LAB — TSH: TSH: 0.804 u[IU]/mL (ref 0.350–4.500)

## 2012-10-23 LAB — VITAMIN B12: Vitamin B-12: 470 pg/mL (ref 211–911)

## 2012-10-23 SURGERY — EGD (ESOPHAGOGASTRODUODENOSCOPY)
Anesthesia: Moderate Sedation

## 2012-10-23 MED ORDER — FENTANYL CITRATE 0.05 MG/ML IJ SOLN
INTRAMUSCULAR | Status: AC
  Filled 2012-10-23: qty 2

## 2012-10-23 MED ORDER — PANTOPRAZOLE SODIUM 40 MG PO TBEC: 40.0000 mg | DELAYED_RELEASE_TABLET | Freq: Every day | ORAL | Status: DC

## 2012-10-23 MED ORDER — FENTANYL CITRATE 0.05 MG/ML IJ SOLN
INTRAMUSCULAR | Status: DC | PRN
  Administered 2012-10-23 (×3): 25 ug via INTRAVENOUS

## 2012-10-23 MED ORDER — SODIUM CHLORIDE 0.9 % IV SOLN
1000.0000 mg | Freq: Once | INTRAVENOUS | Status: AC
  Administered 2012-10-23: 1000 mg via INTRAVENOUS
  Filled 2012-10-23 (×2): qty 20

## 2012-10-23 MED ORDER — BUTAMBEN-TETRACAINE-BENZOCAINE 2-2-14 % EX AERO
INHALATION_SPRAY | CUTANEOUS | Status: DC | PRN
  Administered 2012-10-23: 2 via TOPICAL

## 2012-10-23 MED ORDER — MIDAZOLAM HCL 5 MG/ML IJ SOLN
INTRAMUSCULAR | Status: AC
  Filled 2012-10-23: qty 2

## 2012-10-23 MED ORDER — SODIUM CHLORIDE 0.9 % IV SOLN
25.0000 mg | Freq: Once | INTRAVENOUS | Status: AC
  Administered 2012-10-23: 25 mg via INTRAVENOUS
  Filled 2012-10-23: qty 0.5

## 2012-10-23 MED ORDER — DIPHENHYDRAMINE HCL 50 MG PO CAPS
50.0000 mg | ORAL_CAPSULE | Freq: Once | ORAL | Status: AC
  Administered 2012-10-23: 50 mg via ORAL
  Filled 2012-10-23: qty 1

## 2012-10-23 MED ORDER — MIDAZOLAM HCL 5 MG/5ML IJ SOLN
INTRAMUSCULAR | Status: DC | PRN
  Administered 2012-10-23 (×4): 2 mg via INTRAVENOUS

## 2012-10-23 NOTE — Progress Notes (Signed)
INITIAL NUTRITION ASSESSMENT  DOCUMENTATION CODES Per approved criteria  -Obesity Unspecified   INTERVENTION:  Advance diet as medically appropriate RD to follow for nutrition care plan, add interventions accordingly  NUTRITION DIAGNOSIS: Inadequate oral intake related to inability to eat as evidenced by NPO status  Goal: Oral intake with meals to meet >/= 90% of estimated nutrition needs  Monitor:  PO diet advancement & intake, desire for supplements, weight, labs, I/O's  Reason for Assessment: Malnutrition Screening Tool Report  79 y.o. female  Admitting Dx: GI bleed  ASSESSMENT: Patient wtih PMH significant for asthma, HTN, CAD came to ED complaining of increase weakness, fatigue and black stool; she was transitioned to Plavix in March and her stool has been black and has been progressively feeling weaker since this time ---> admitted for further evaluation and treatment.  RD unable to obtain nutrition hx from patient ---> in Endoscopy at this time to assess for upper GI bleed; per chart review, patient has had a decreased appetite; per weight records patient has had an 11% weight loss since June 2013 (not significant for time frame).  Height: Ht Readings from Last 1 Encounters:  10/22/12 5' (1.524 m)    Weight: Wt Readings from Last 1 Encounters:  10/23/12 155 lb (70.308 kg)    Ideal Body Weight: 100 lb  % Ideal Body Weight: 155%  Wt Readings from Last 10 Encounters:  10/23/12 155 lb (70.308 kg)  10/23/12 155 lb (70.308 kg)  07/16/12 161 lb 6 oz (73.2 kg)  07/16/12 161 lb 6 oz (73.2 kg)  01/29/12 165 lb (74.844 kg)  01/11/12 164 lb 12.8 oz (74.753 kg)  01/01/12 166 lb 8 oz (75.524 kg)  12/13/11 174 lb (78.926 kg)  12/13/11 174 lb (78.926 kg)  12/06/11 174 lb (78.926 kg)    Usual Body Weight: 174 lb  % Usual Body Weight: 89%  BMI:  Body mass index is 30.27 kg/(m^2).  Estimated Nutritional Needs: Kcal: 1750-1950 Protein: 80-90 gm Fluid: 1.7-1.9  L  Skin: Intact  Diet Order: NPO  EDUCATION NEEDS: -No education needs identified at this time   Intake/Output Summary (Last 24 hours) at 10/23/12 1459 Last data filed at 10/23/12 0821  Gross per 24 hour  Intake  372.5 ml  Output      0 ml  Net  372.5 ml    Last BM: 4/24  Labs:   Recent Labs Lab 10/22/12 1327 10/23/12 0612  NA 138 138  K 4.0 3.4*  CL 102 100  CO2 25 27  BUN 29* 22  CREATININE 1.30* 1.03  CALCIUM 10.1 9.6  GLUCOSE 125* 123*    Scheduled Meds: . [facility] HOLD] cholecalciferol  1,000 Units Oral Daily  . [MAR HOLD] iron dextran (INFED/DEXFERRUM) infusion  1,000 mg Intravenous Once  . [facility] HOLD] loratadine  10 mg Oral Daily  . [MAR HOLD] lovastatin  20 mg Oral QHS  . [facility] HOLD] metoprolol  25 mg Oral BID  . [facility] HOLD] pantoprazole  40 mg Oral Q0600  . [facility] HOLD] sodium chloride  3 mL Intravenous Q12H    Continuous Infusions: . sodium chloride 20 mL/hr at 10/23/12 0941    Past Medical History  Diagnosis Date  . Arthritis   . Asthma   . Adenomatous colon polyp MAY 2013    HOSPITALIZED AT [facility] WITH HGB 7 -TRANSFUSED-MASS FOUND IN ASCENDING COLON  . Diverticulosis   . HLD (hyperlipidemia)   . Obesity   . Internal hemorrhoids   . Osteoporosis   .  Anemia   . Blood transfusion MAY 2013  . Heart attack 01/01/2001    AUG 2002 HEART STENT PLACEMENT  . Hypertension   . Complication of anesthesia 2009    HIP REPLACEMENT-PT HAD HARD TIME WAKING UP--FELT LIKE SHE COULDN'T BREATHE  . CAD (coronary artery disease)     "SINGLE VESSEL CORONARY DISEASE WITH CX-OM-2 STENTING IN 2002 AND CARDIAC BALLOON ATHERECTOMY FOR IN-STENT RESTENOSIS IN 2003 ICH WAS WIDELY PATENT ON CATHETERIZATION IN 2004 " - PER CARDIOLOGY OFFICE NOTE 11/23/11 FROM DR. Alanda Amass  . Cancer     colon polyp  . Multiple lung nodules 08/2012    scattered, bilateral but right>left.   [name] Kitchen DJD (degenerative joint disease)     of spine    Past Surgical History  Procedure Laterality Date  .  Appendectomy    . Cholecystectomy    . Coronary angioplasty with stent placement    . Colonoscopy w/ biopsies and polypectomy  06/15/2008    adenomatous polyps, diverticulosis, internal hemorrhoids  . Total hip arthroplasty  09/08/2007    left, Dr. Lequita Halt  . Colonoscopy  11/16/2011    Procedure: COLONOSCOPY;  Surgeon: Iva Boop, MD;  Location: [facility] ENDOSCOPY;  Service: Endoscopy;  Laterality: N/A;  . Knee replacement  2003    left  . Joint replacement  2003    RT HIP 2003  L KNEE 2003  LEFT HIP [address] [place], [place] Pager #: [phone] After-Hours Pager #: [phone]

## 2012-10-23 NOTE — Telephone Encounter (Signed)
Left message for patient to call back  

## 2012-10-23 NOTE — Telephone Encounter (Signed)
Message copied by Annett Fabian on Thu Oct 23, 2012  4:47 PM ------      Message from: Dianah Field      Created: Thu Oct 23, 2012  3:14 PM       Hi Lavonna Rua,  Mrs Lungren had negative EGD for eval of anemia today.  Dr Arlyce Dice thinks she should have capsule endoscopy.  However she is also soon due for repeat colonoscopy as she has long hx of colon polyps and had hemicolectomy in 12/2011 for colon cancer.  Can u set up the capsule study?  Dr Leone Payor can also weigh in on when he wants to set up her colonoscopy.  She has not have overt bleeding. Not clear yet if MDs will resume her Plavix, but it is on hold now.             Thanks, Maralyn Sago      [phone]  ------

## 2012-10-23 NOTE — Op Note (Signed)
[facility] [address] [place], [address]   ENDOSCOPY PROCEDURE REPORT  PATIENT: Mary, Mcgrath  MR#: [id number] BIRTHDATE: 29-Aug-1927 , 85  yrs. old GENDER: Female ENDOSCOPIST: Louis Meckel, MD REFERRED BY:  Merlene Laughter, M.D. PROCEDURE DATE:  10/23/2012 PROCEDURE:  EGD, diagnostic ASA CLASS:     Class II INDICATIONS:  Iron deficiency anemia. MEDICATIONS: These medications were titrated to patient response per physician's verbal order, Versed 9 mg IV, and Fentanyl 75 mcg IV TOPICAL ANESTHETIC: Cetacaine Spray  DESCRIPTION OF PROCEDURE: After the risks benefits and alternatives of the procedure were thoroughly explained, informed consent was obtained.  The EG-[id number] ([id number]) endoscope was introduced through the mouth and advanced to the third portion of the duodenum. Without limitations.  The instrument was slowly withdrawn as the mucosa was fully examined.      The upper, middle and distal third of the esophagus were carefully inspected and no abnormalities were noted.  The z-line was well seen at the GEJ.  The endoscope was pushed into the fundus which was normal including a retroflexed view.  The antrum, gastric body, first and second part of the duodenum were unremarkable. Retroflexed views revealed no abnormalities.     The scope was then withdrawn from the patient and the procedure completed.  COMPLICATIONS: There were no complications. ENDOSCOPIC IMPRESSION: Normal EGD  RECOMMENDATIONS: Capsule endoscopy - Will arrange has an outpatient study  REPEAT EXAM:  eSigned:  Louis Meckel, MD 10/23/2012 3:14 PM   CC: Stan Head, MD  PATIENT NAME:  Mary, Mcgrath MR#: [id number]

## 2012-10-23 NOTE — ED Provider Notes (Signed)
See prior note   Ward Givens, MD 10/23/12 1505

## 2012-10-23 NOTE — Progress Notes (Signed)
7:28 AM   Iron replacement consult.       PT with increasing weakness, fatigue and black tarry stool. hgb 8.4 with desires hgb >12 requirements =1gm of iron to increase hgb to >12.  Test dose ordered will wait on result before administering 1gm infusion.   [name]

## 2012-10-23 NOTE — Progress Notes (Signed)
[facility] PROGRESS NOTE  [name] ZOX:[id number] DOB: [birth year] DOA: 10/22/2012 PCP: Ginette Otto, MD  Assessment/Plan: 1-GI bleed: patient with black stools for 2-3 weeks; also with positive FOBT in ED and decrease in her Hgb from 12.6 down to 8.4. After 1 unit of PRBC Hgb 8.8 -2 more episodes of black stools since admission. -hemodynamically stable otherwise -Will give IV iron. Ferritin 7 -continue CBC q12h  -GI consulted for colonoscopy/EGD (EGD this afternoon 4/25) -continue protonix  -continue holding plavix and ASA  2-HYPERLIPIDEMIA: continue statins   3-HYPERTENSION: stable. Will continue holding lisinopril and HCTZ; continue metoprolol.   4-CAD, OM2 HSRA 7/02, OM2 Stent 9/02, ISR 11/03. RCA DES 1 /14/14: will hold plavix and ASA due to GI bleed. Cardiology consulted for guidance after stopping meds and after bleeding is controlled.  -continue B-blocker and statins  -plan is to resume plavix after GI bleed controlled/subsided. (needs at least 6-12 months of antiplatelets therapy)  5-Anemia due blood loss: confirmed by anemia panel.  -received 1 unit of PRBC -will infuse iron -most likely prior to discharge will transfuse another unit of PRBC -CBC q12h  6-Asthma: stable. No wheezing. Continue PRN albuterol   7-Acute renal failure: secondary to decrease perfusion with anemia and continue use of HCTZ and lisinopril.  -UA no demonstrating infection -Cr improved with IVF's and holding on nephrotoxic agents  -follow BMET   8-Grade 1 diastolic CHF: compensated. Will follow daily weight and strict intake and output. Last EF was 55-60% on 08/2012  9-Small lung nodules: plan is for repeat CT in August 2014 as recommended by radiology. No signs of infection and denying significant SOB, weight loss or any other complaints.  DVT: SCD's   Code Status: Full Family Communication: no family at bedside Disposition Plan: home when medically  stable   Consultants:  GI  Cardiology  Procedures:  EGD today (4/25)  Antibiotics:  none  HPI/Subjective: Afebrile, no CP, no SOB; denies lightheadedness. 2 more episodes of Black stools  Objective: Filed Vitals:   10/22/12 2230 10/22/12 2330 10/23/12 0000 10/23/12 0514  BP: 104/62 103/62 136/82 141/52  Pulse: 92 76 81 81  Temp: 98.3 F (36.8 C) 97.6 F (36.4 C) 97.9 F (36.6 C) 98 F (36.7 C)  TempSrc: Oral Oral Oral Oral  Resp: 16 16 16    Height:      Weight:    70.308 kg (155 lb)  SpO2:    98%    Intake/Output Summary (Last 24 hours) at 10/23/12 1257 Last data filed at 10/23/12 8119  Gross per 24 hour  Intake  372.5 ml  Output      0 ml  Net  372.5 ml   Filed Weights   10/22/12 1949 10/23/12 0514  Weight: 70.534 kg (155 lb 8 oz) 70.308 kg (155 lb)    Exam:   General:  Afebrile, no acute distress  Cardiovascular: S1 and S2, no rubs or gallops  Respiratory: CTA  Abdomen: soft, NT, ND, positive BS  Musculoskeletal: no joint swelling or erythema  Neuro: non focal  Data Reviewed: Basic Metabolic Panel:  Recent Labs Lab 10/22/12 1327 10/23/12 0612  NA 138 138  K 4.0 3.4*  CL 102 100  CO2 25 27  GLUCOSE 125* 123*  BUN 29* 22  CREATININE 1.30* 1.03  CALCIUM 10.1 9.6   Liver Function Tests:  Recent Labs Lab 10/22/12 1327  AST 19  ALT 12  ALKPHOS 60  BILITOT 0.7  PROT 7.7  ALBUMIN 3.9  Recent Labs Lab 10/22/12 1327  AMYLASE 94   CBC:  Recent Labs Lab 10/22/12 1327 10/23/12 0612  WBC 10.8* 8.9  NEUTROABS 8.2*  --   HGB 8.4* 8.8*  HCT 26.4* 26.6*  MCV 81.2 79.9  PLT 404* 315   BNP (last 3 results)  Recent Labs  07/16/12 0704  PROBNP 145.4   CBG: No results found for this basename: GLUCAP,  in the last 168 hours  No results found for this or any previous visit (from the past 240 hour(s)).   Studies: No results found.  Scheduled Meds: . cholecalciferol  1,000 Units Oral Daily  . iron dextran  (INFED/DEXFERRUM) infusion  1,000 mg Intravenous Once  . loratadine  10 mg Oral Daily  . lovastatin  20 mg Oral QHS  . metoprolol  25 mg Oral BID  . pantoprazole  40 mg Oral Q0600  . sodium chloride  3 mL Intravenous Q12H   Continuous Infusions: . sodium chloride 20 mL/hr at 10/23/12 0941    Principal Problem:   GI bleed Active Problems:   HYPERLIPIDEMIA   HYPERTENSION   CAD, OM2 HSRA 7/02, OM2 Stent 9/02, ISR 11/03. RCA DES 1 /14/14   Anemia   Asthma   Acute renal failure    Time spent: >30 minutes    [name]  [facility] Pager [phone]. If 7PM-7AM, please contact night-coverage at [email or web address], password [facility] 10/23/2012, 12:57 PM  LOS: 1 day

## 2012-10-23 NOTE — Consult Note (Signed)
[facility] Consult: 8:27 AM 10/23/2012   Referring Provider: Dr Gwenlyn Perking Primary Care Physician:  Ginette Otto, MD Primary Gastroenterologist:  Dr. Leone Payor  Reason for Consultation:  Iron deficiency anemia.    HPI: [name] is a 79 y.o. female.  Hx asthma, obesity, spinal stenosis, CAD s/p BMS 2002, angioplasty of in-stent restenosis 2003.  Right colectomy 12/2011 for invasive adenocarcinoma (T2N0).  Had undergone colonoscopy for evaluation of iron def anemia (Hgb 7.0and required 2 PRBCs).  Dr Alcide Evener did not recommend adjuvant chemotherapy.  Prior hx of colon polyps from 1991 through 2009 In 07/2012 admitted with chest pain and new RCA stenosis treated with DES. Placed on Brilinta. However it caused SOB/DOE so on 09/26/2012 Dr Alanda Amass changed to Plavix despite hx of SOB/DOE with Plavix.  Pt says it caused less SOB but still she had SOB. Additional meds include 81 mg ASA.  Does not take any PPI etc.  On 4/21 had labs at [facility], don't have results, but was started on po Iron and told to stop ASA.  Arrangements were to be made for follow up with (heme?, GI?).  However she go so SOB that she came to ED yesterday.   Hgb 07/16/12 was 12.4, MCV was 91. Yesterday Hgb 8.4, MCV 81. She got one unit PRBC overnight and parenteral Iron ordered for today.  BUN/creat elevated at 29/1.3 compared to 15/0.7 in Jan 2014.   Note that CT angio of chest 3/28 shows indeterminate lung nodules but no PE.  She is a former smoker.  Has lost 25 # in last 11 months and her appetite is reduced.  No n/v.  No NSAIDs.  Drinks 7 glasses wine per year.     Past Medical History  Diagnosis Date  . Arthritis   . Asthma   . Adenomatous colon polyp MAY 2013    HOSPITALIZED AT [facility] WITH HGB 7 -TRANSFUSED-MASS FOUND IN ASCENDING COLON  . Diverticulosis   . HLD (hyperlipidemia)   . Obesity   . Internal hemorrhoids   . Osteoporosis   . Anemia   . Blood transfusion  MAY 2013  . Heart attack 01/01/2001    AUG 2002 HEART STENT PLACEMENT  . Hypertension   . Complication of anesthesia 2009    HIP REPLACEMENT-PT HAD HARD TIME WAKING UP--FELT LIKE SHE COULDN'T BREATHE  . CAD (coronary artery disease)     "SINGLE VESSEL CORONARY DISEASE WITH CX-OM-2 STENTING IN 2002 AND CARDIAC BALLOON ATHERECTOMY FOR IN-STENT RESTENOSIS IN 2003 ICH WAS WIDELY PATENT ON CATHETERIZATION IN 2004 " - PER CARDIOLOGY OFFICE NOTE 11/23/11 FROM DR. Alanda Amass  . Cancer     colon polyp    Past Surgical History  Procedure Laterality Date  . Appendectomy    . Cholecystectomy    . Coronary angioplasty with stent placement    . Colonoscopy w/ biopsies and polypectomy  06/15/2008    adenomatous polyps, diverticulosis, internal hemorrhoids  . Total hip arthroplasty  09/08/2007    left, Dr. Lequita Halt  . Colonoscopy  11/16/2011    Procedure: COLONOSCOPY;  Surgeon: Iva Boop, MD;  Location: [facility] ENDOSCOPY;  Service: Endoscopy;  Laterality: N/A;  . Knee replacement      left  . Joint replacement      RT HIP 2003  L KNEE 2003  LEFT HIP 2009    Prior to Admission medications   Medication Sig Start Date End Date Taking? Authorizing Provider  acetaminophen (TYLENOL) 325 MG tablet Take 650 mg by mouth every 6 (  six) hours as needed. For pain   Yes Historical Provider, MD  albuterol (PROVENTIL HFA;VENTOLIN HFA) 108 (90 BASE) MCG/ACT inhaler Inhale 2 puffs into the lungs every 4 (four) hours as needed. For shortness of breath   Yes Historical Provider, MD  aspirin 81 MG tablet Take 81 mg by mouth daily.   Yes Historical Provider, MD  BIOTIN PO Take 1 tablet by mouth daily.   Yes Historical Provider, MD  Calcium Carbonate-Vitamin D (CALCIUM + D PO) Take 1 tablet by mouth daily. Calcium 500mg  with Vitamin D   Yes Historical Provider, MD  cetirizine (ZYRTEC) 10 MG tablet Take 5 mg by mouth 2 (two) times daily.    Yes Historical Provider, MD  cholecalciferol (VITAMIN D) 1000 UNITS tablet Take 1,000  Units by mouth daily.   Yes Historical Provider, MD  clopidogrel (PLAVIX) 75 MG tablet Take 75 mg by mouth daily.   Yes Historical Provider, MD  ferrous sulfate 325 (65 FE) MG EC tablet Take 162.5 mg by mouth daily with breakfast.  11/16/11 11/15/12 Yes Lonia Blood, MD  lovastatin (ALTOPREV) 20 MG 24 hr tablet Take 20 mg by mouth at bedtime.   Yes Historical Provider, MD  metoprolol (LOPRESSOR) 50 MG tablet Take 25 mg by mouth 2 (two) times daily.    Yes Historical Provider, MD  oxymetazoline (AFRIN) 0.05 % nasal spray Place 2 sprays into the nose 2 (two) times daily.   Yes Historical Provider, MD  pantoprazole (PROTONIX) 40 MG tablet Take 40 mg by mouth daily.   Yes Historical Provider, MD  triamterene-hydrochlorothiazide (DYAZIDE) 37.5-25 MG per capsule Take 1 capsule by mouth every morning.   Yes Historical Provider, MD  nitroGLYCERIN (NITROSTAT) 0.4 MG SL tablet Place 1 tablet (0.4 mg total) under the tongue every 5 (five) minutes as needed for chest pain. 07/16/12   Abelino Derrick, PA-C  Ticagrelor (BRILINTA) 90 MG TABS tablet Take 1 tablet (90 mg total) by mouth 2 (two) times daily. 07/16/12   Abelino Derrick, PA-C    Scheduled Meds: . cholecalciferol  1,000 Units Oral Daily  . iron dextran (INFED/DEXFERRUM) infusion  25 mg Intravenous Once   Followed by  . iron dextran (INFED/DEXFERRUM) infusion  1,000 mg Intravenous Once  . loratadine  10 mg Oral Daily  . lovastatin  20 mg Oral QHS  . metoprolol  25 mg Oral BID  . pantoprazole (PROTONIX) IV  40 mg Intravenous Q12H  . sodium chloride  3 mL Intravenous Q12H   Infusions: . sodium chloride 20 mL (10/22/12 2015)   PRN Meds: acetaminophen, acetaminophen, albuterol, nitroGLYCERIN, ondansetron (ZOFRAN) IV, ondansetron   Allergies as of 10/22/2012 - Review Complete 10/22/2012  Allergen Reaction Noted  . Atorvastatin Shortness Of Breath   . Brilinta (ticagrelor) Shortness Of Breath 10/22/2012  . Clopidogrel bisulfate Shortness Of  Breath   . Codeine Nausea And Vomiting 06/01/2008  . Sulfonamide derivatives Other (See Comments) 06/01/2008    Family History  Problem Relation Age of Onset  . Pancreatic cancer Father   . Cancer Father     prostate  . Pancreatic cancer Brother   . Cancer Brother     colon  . Heart disease Mother   . Heart disease Sister   . Colon cancer Brother     ? Colostomy  . Cancer Brother     prostate    History   Social History  . Marital Status: Married    Spouse Name: N/A    Number  of Children: 2  . Years of Education: N/A   Occupational History  . retired    Social History Main Topics  . Smoking status: Former Smoker -- 30 years    Types: Cigarettes    Quit date: 11/23/1982  . Smokeless tobacco: Never Used  . Alcohol Use: Yes     Comment: occ wine  . Drug Use: No  . Sexually Active: Not on file   Other Topics Concern  . Not on file   Social History Narrative   Married, Agricultural consultant at [employer]    REVIEW OF SYSTEMS: Constitutional:  Per HPI ENT:  No nose bleeds, no excessive bleeding.  Some seasonal allergies and sniffles Pulm:  Per HPI.  No cough. CV:  No palpitations.  No PND.  No orthopnea.  Stable long term dizziness for 10 seconds or so after she moves from flat or seated to standing. GU:  No blood in urine, rare overflow "dribblin" GI:  No dysphagia, no heartburn.  No N/V Heme:  Anemia per hx.    Transfusions:  Transfusions in 10/2011 and overnight Neuro:  No headache, no tingling or numbness. No limb weakness Derm:  No rash or sores, dry skin Endocrine:  No excessive thirst Immunization:  Up to date vaccinations Travel:  None.    PHYSICAL EXAM: Vital signs in last 24 hours: Temp:  [97.6 F (36.4 C)-98.6 F (37 C)] 98 F (36.7 C) (04/24 0514) Pulse Rate:  [76-96] 81 (04/24 0514) Resp:  [12-16] 16 (04/24 0000) BP: (103-152)/(52-82) 141/52 mmHg (04/24 0514) SpO2:  [97 %-100 %] 98 % (04/24 0514) Weight:  [70.308 kg (155 lb)-70.534 kg (155 lb 8  oz)] 70.308 kg (155 lb) (04/24 0514)  General: pleasant, well but pale elderly WF.  comfortable Head:  No asymmetry or swelling  Eyes:  No icterus or pallor Ears:  Not HOH  Nose:  No congestion. Mouth:  Clear, moist oral MM.  Good dentition Neck:  No mass, no JVD, no bruits Lungs:  Clear, no wheezes, good BS Heart: RRR.  No MRG Abdomen:  Soft, no mass, scar healed.  No bruits, no ascites, not distended.  No HSM.   Rectal: dark but not melenic stool is FOB positive   Musc/Skeltl: no joint redness or swellling.  No contractures Extremities:  No pedal edema  Neurologic:  Pleasant, no tremor, no gross deficits.  Oriented x3.  Good historian.  Skin:  No rash or sores.  Dry skin, purpura on arms Tattoos:  none Nodes:  No inguinal or cervical adenopathy.   Psych:  Pleasant, no anxiety or depression.   Intake/Output from previous day: 04/23 0701 - 04/24 0700 In: 12.5 [Blood:12.5] Out: -  Intake/Output this shift: Total I/O In: 360 [P.O.:360] Out: -   LAB RESULTS:  Recent Labs  10/22/12 1327 10/23/12 0612  WBC 10.8* 8.9  HGB 8.4* 8.8*  HCT 26.4* 26.6*  PLT 404* 315   BMET Lab Results  Component Value Date   NA 138 10/23/2012   NA 138 10/22/2012   NA 138 07/16/2012   K 3.4* 10/23/2012   K 4.0 10/22/2012   K 3.5 07/16/2012   CL 100 10/23/2012   CL 102 10/22/2012   CL 104 07/16/2012   CO2 27 10/23/2012   CO2 25 10/22/2012   CO2 23 07/16/2012   GLUCOSE 123* 10/23/2012   GLUCOSE 125* 10/22/2012   GLUCOSE 128* 07/16/2012   BUN 22 10/23/2012   BUN 29* 10/22/2012   BUN 15 07/16/2012  CREATININE 1.03 10/23/2012   CREATININE 1.30* 10/22/2012   CREATININE 0.72 07/16/2012   CALCIUM 9.6 10/23/2012   CALCIUM 10.1 10/22/2012   CALCIUM 9.5 07/16/2012   LFT  Recent Labs  10/22/12 1327  PROT 7.7  ALBUMIN 3.9  AST 19  ALT 12  ALKPHOS 60  BILITOT 0.7   PT/INR Lab Results  Component Value Date   INR 1.06 07/14/2012   INR 1.04 11/14/2011   INR 2.4* 09/07/2007   Ferritin 8 Iron  11  RADIOLOGY STUDIES: 10/10/12  Ultrasound abdomen To evaluate swelling IMPRESSION:  1. Cholecystectomy  2. Stable simple hepatic cysts  3. Negative for ascites  4. Aortic atherosclerosis  09/26/12 CT Angio Chest IMPRESSION:  Negative for pulmonary embolism.  The main pulmonary artery is enlarged and raises concern for  pulmonary hypertension.  Thickening and nodularity of the adrenal glands probably represents  hyperplasia.  There are scattered small nodular densities throughout the lungs,  particularly the right lung. Largest nodules roughly measures 6  mm. Suspect that these nodules are inflammatory or post  inflammatory in etiology, but indeterminate. Recommend follow-up.  If the patient is at high risk for bronchogenic carcinoma, follow-  up chest CT at 6-12 months is recommended. If the patient is at  low risk for bronchogenic carcinoma, follow-up chest CT at 12  months is recommended. This recommendation follows the consensus  statement: Guidelines for Management of Small Pulmonary Nodules  Detected on CT Scans: A Statement from the Fleischner Society as  published in Radiology 2005; 237:395-400.    ENDOSCOPIC STUDIES: 10/2011  Colonoscopy for Iron Def Anemia ENDOSCOPIC IMPRESSION:  1) Mass in the ascending colon suspect carcinoma causing blood  loss anemia - biopsied  2) Severe diverticulosis in the sigmoid colon  3) Diverticula, scattered in the right colon  4) Otherwise normal examination, excellent prep  RECOMMENDATIONS:  1) Await biopsy results  2) Stay off ASA  3) ferrous sulfate supplements  4) Can go home today (no driving after sedation) - I will call  pathology results and plans. I did call husband and explain things  today.   IMPRESSION: *  Iron deficiency anemia.  Got one unit PRBC and will get parenteral iron today.  *  CAD.  Previous BMS and 07/2012 DES to RCA.  On Brilinta 07/2012 - 09/26/12, on Plavix therafter.  Both meds seem to be associated  with worsening DOE.   *  Lung nodules.  ? Are these also responsible for her dyspnea along with the anemia?  *  Hx adenocarcinoma of colon.  12/2011 right hemicolectomy.  No adjuvant chemoradiation. *  Diminished appetite and 25# weight loss.  *  Renal insufficiency.  BUN may be up due to UGI bleeding  PLAN: * EGD today.  Last dose of Plavix with last dose on 4/22 PM *  Po Protonix *  ? Further eval of lung nodules?   LOS: 1 day   Jennye Moccasin  10/23/2012, 8:27 AM Pager: [phone]  Chart was reviewed and patient was examined. X-rays and lab were reviewed.  Patient is admitted with a symptomatic anemia. She likely  chronic GI bleeding, likely from an upper GI bleeding source in view of her relatively recent colonoscopy. Bleeding is exacerbated by antiplatelet medication. Active peptic ulcer disease, AVMs or neoplasm are considerations. Plan to proceed with upper endoscopy. Further recommendations pending above.   Barbette Hair. Arlyce Dice, M.D., [facility] Gastroenterology Cell [phone]

## 2012-10-23 NOTE — Progress Notes (Signed)
Upper endoscopy demonstrated no abnormalities. Recommend outpatient capsule study. Okay to continue antiplatelet medications provided that blood counts are monitored very carefully. Patient should continue supplemental iron therapy.

## 2012-10-23 NOTE — Consult Note (Signed)
Reason for Consult: Antiplatelet therapy recommendation in the setting of GI bleed Referring Physician: TRH  HPI: The patient is an 79 y/o female, followed at [facility] by Dr. Alanda Amass. [name] has a history of known CAD. [name] underwent stenting of the circumflex OM2 in 2002 and subsequent cutting ballon atherectomy for ISR in 2003. [name] had a relook cath in 2004 and the circumflex stent was widely patent. [name] was hospitalized in January of 2014 for acute coronary syndrome without MI, and catheterization by Dr. Herbie Baltimore was done through the right radial approach. [name] had RCA stenosis after the atrial branch and a 3rd generation Promus DES was placed and post dilated to 3.0/15. Her OM2 stent was widely patent. [name] was sent home on ticagrelor, however developed shortness of breath. The patient felt that her SOB was clearly related to the ticagrelor.  Subsequently, [name] was switched to Plavix. Since being on Plavix, [name] has reported black, tarry stools. [name] presented to [facility] on 10/22/12 with a complaint of increasing fatigue, weakness and melena. In the ED, her Hgb was 8.4 (down from 12.7). FOBT was possitive. [name] was admitted by [facility]. [name] is scheduled to undergo an EGD today.  [facility] has been consulted for recommendation for continued antiplatelet therapy. [name] denies any chest pain since her last intervention. No SOB or LEE.    Past Medical History  Diagnosis Date  . Arthritis   . Asthma   . Adenomatous colon polyp MAY 2013    HOSPITALIZED AT [facility] WITH HGB 7 -TRANSFUSED-MASS FOUND IN ASCENDING COLON  . Diverticulosis   . HLD (hyperlipidemia)   . Obesity   . Internal hemorrhoids   . Osteoporosis   . Anemia   . Blood transfusion MAY 2013  . Heart attack 01/01/2001    AUG 2002 HEART STENT PLACEMENT  . Hypertension   . Complication of anesthesia 2009    HIP REPLACEMENT-PT HAD HARD TIME WAKING UP--FELT LIKE [name] COULDN'T BREATHE  . CAD (coronary artery disease)     "SINGLE VESSEL CORONARY DISEASE WITH CX-OM-2  STENTING IN 2002 AND CARDIAC BALLOON ATHERECTOMY FOR IN-STENT RESTENOSIS IN 2003 ICH WAS WIDELY PATENT ON CATHETERIZATION IN 2004 " - PER CARDIOLOGY OFFICE NOTE 11/23/11 FROM DR. Alanda Amass  . Cancer     colon polyp  . Multiple lung nodules 08/2012    scattered, bilateral but right>left.   [name] Kitchen DJD (degenerative joint disease)     of spine    Past Surgical History  Procedure Laterality Date  . Appendectomy    . Cholecystectomy    . Coronary angioplasty with stent placement    . Colonoscopy w/ biopsies and polypectomy  06/15/2008    adenomatous polyps, diverticulosis, internal hemorrhoids  . Total hip arthroplasty  09/08/2007    left, Dr. Lequita Halt  . Colonoscopy  11/16/2011    Procedure: COLONOSCOPY;  Surgeon: Iva Boop, MD;  Location: [facility];  Service: Endoscopy;  Laterality: N/A;  . Knee replacement  2003    left  . Joint replacement  2003    RT HIP 2003  L KNEE 2003  LEFT HIP 2009    Family History  Problem Relation Age of Onset  . Pancreatic cancer Father   . Cancer Father     prostate  . Pancreatic cancer Brother   . Cancer Brother     colon  . Heart disease Mother   . Heart disease Sister   . Colon cancer Brother     ? Colostomy  . Cancer Brother  prostate    Social History:  reports that [name] quit smoking about 29 years ago. Her smoking use included Cigarettes. [name] smoked 0.00 packs per day for 30 years. [name] has never used smokeless tobacco. [name] reports that  drinks alcohol. [name] reports that [name] does not use illicit drugs.  Allergies:  Allergies  Allergen Reactions  . Atorvastatin Shortness Of Breath  . Brilinta (Ticagrelor) Shortness Of Breath  . Clopidogrel Bisulfate Shortness Of Breath  . Codeine Nausea And Vomiting  . Sulfonamide Derivatives Other (See Comments)    Intestinal upset    Medications:  Prior to Admission:  Prescriptions prior to admission  Medication Sig Dispense Refill  . acetaminophen (TYLENOL) 325 MG tablet Take 650 mg by mouth  every 6 (six) hours as needed. For pain      . albuterol (PROVENTIL HFA;VENTOLIN HFA) 108 (90 BASE) MCG/ACT inhaler Inhale 2 puffs into the lungs every 4 (four) hours as needed. For shortness of breath      . aspirin 81 MG tablet Take 81 mg by mouth daily.      [name] Kitchen BIOTIN PO Take 1 tablet by mouth daily.      . Calcium Carbonate-Vitamin D (CALCIUM + D PO) Take 1 tablet by mouth daily. Calcium 500mg  with Vitamin D      . cetirizine (ZYRTEC) 10 MG tablet Take 5 mg by mouth 2 (two) times daily.       . cholecalciferol (VITAMIN D) 1000 UNITS tablet Take 1,000 Units by mouth daily.      . clopidogrel (PLAVIX) 75 MG tablet Take 75 mg by mouth daily.      . ferrous sulfate 325 (65 FE) MG EC tablet Take 162.5 mg by mouth daily with breakfast.       . lovastatin (ALTOPREV) 20 MG 24 hr tablet Take 20 mg by mouth at bedtime.      . metoprolol (LOPRESSOR) 50 MG tablet Take 25 mg by mouth 2 (two) times daily.       [name] Kitchen oxymetazoline (AFRIN) 0.05 % nasal spray Place 2 sprays into the nose 2 (two) times daily.      . pantoprazole (PROTONIX) 40 MG tablet Take 40 mg by mouth daily.      [name] Kitchen triamterene-hydrochlorothiazide (DYAZIDE) 37.5-25 MG per capsule Take 1 capsule by mouth every morning.      . nitroGLYCERIN (NITROSTAT) 0.4 MG SL tablet Place 1 tablet (0.4 mg total) under the tongue every 5 (five) minutes as needed for chest pain.  25 tablet  2  . Ticagrelor (BRILINTA) 90 MG TABS tablet Take 1 tablet (90 mg total) by mouth 2 (two) times daily.  60 tablet  11    Results for orders placed during the hospital encounter of 10/22/12 (from the past 48 hour(s))  CBC WITH DIFFERENTIAL     Status: Abnormal   Collection Time    10/22/12  1:27 PM      Result Value Range   WBC 10.8 (*) 4.0 - 10.5 K/uL   RBC 3.25 (*) 3.87 - 5.11 MIL/uL   Hemoglobin 8.4 (*) 12.0 - 15.0 g/dL   HCT 60.4 (*) 54.0 - 98.1 %   MCV 81.2  78.0 - 100.0 fL   MCH 25.8 (*) 26.0 - 34.0 pg   MCHC 31.8  30.0 - 36.0 g/dL   RDW 19.1  47.8 - 29.5 %    Platelets 404 (*) 150 - 400 K/uL   Neutrophils Relative 76  43 - 77 %   Neutro  Abs 8.2 (*) 1.7 - 7.7 K/uL   Lymphocytes Relative 14  12 - 46 %   Lymphs Abs 1.5  0.7 - 4.0 K/uL   Monocytes Relative 8  3 - 12 %   Monocytes Absolute 0.8  0.1 - 1.0 K/uL   Eosinophils Relative 2  0 - 5 %   Eosinophils Absolute 0.2  0.0 - 0.7 K/uL   Basophils Relative 0  0 - 1 %   Basophils Absolute 0.0  0.0 - 0.1 K/uL  COMPREHENSIVE METABOLIC PANEL     Status: Abnormal   Collection Time    10/22/12  1:27 PM      Result Value Range   Sodium 138  135 - 145 mEq/L   Potassium 4.0  3.5 - 5.1 mEq/L   Chloride 102  96 - 112 mEq/L   CO2 25  19 - 32 mEq/L   Glucose, Bld 125 (*) 70 - 99 mg/dL   BUN 29 (*) 6 - 23 mg/dL   Creatinine, Ser 0.45 (*) 0.50 - 1.10 mg/dL   Calcium 40.9  8.4 - 81.1 mg/dL   Total Protein 7.7  6.0 - 8.3 g/dL   Albumin 3.9  3.5 - 5.2 g/dL   AST 19  0 - 37 U/L   ALT 12  0 - 35 U/L   Alkaline Phosphatase 60  39 - 117 U/L   Total Bilirubin 0.7  0.3 - 1.2 mg/dL   GFR calc non Af Amer 37 (*) >90 mL/min   GFR calc Af Amer 43 (*) >90 mL/min   Comment:            The eGFR has been calculated     using the CKD EPI equation.     This calculation has not been     validated in all clinical     situations.     eGFR's persistently     <90 mL/min signify     possible Chronic Kidney Disease.  AMYLASE     Status: None   Collection Time    10/22/12  1:27 PM      Result Value Range   Amylase 94  0 - 105 U/L  TYPE AND SCREEN     Status: None   Collection Time    10/22/12  1:30 PM      Result Value Range   ABO/RH(D) O NEG     Antibody Screen POS     Sample Expiration 10/25/2012     DAT, IgG NEG     Antibody Identification ANTI-K     Unit Number [id number]     Blood Component Type RBC LR PHER1     Unit division 00     Status of Unit ISSUED,FINAL     Donor AG Type NEGATIVE FOR KELL ANTIGEN     Transfusion Status OK TO TRANSFUSE     Crossmatch Result COMPATIBLE    OCCULT BLOOD, POC  DEVICE     Status: Abnormal   Collection Time    10/22/12  4:58 PM      Result Value Range   Fecal Occult Bld POSITIVE (*) NEGATIVE  URINALYSIS, ROUTINE W REFLEX MICROSCOPIC     Status: Abnormal   Collection Time    10/22/12  5:26 PM      Result Value Range   Color, Urine YELLOW  YELLOW   APPearance CLOUDY (*) CLEAR   Specific Gravity, Urine 1.020  1.005 - 1.030   pH 7.0  5.0 - 8.0  Glucose, UA NEGATIVE  NEGATIVE mg/dL   Hgb urine dipstick NEGATIVE  NEGATIVE   Bilirubin Urine NEGATIVE  NEGATIVE   Ketones, ur NEGATIVE  NEGATIVE mg/dL   Protein, ur NEGATIVE  NEGATIVE mg/dL   Urobilinogen, UA 0.2  0.0 - 1.0 mg/dL   Nitrite NEGATIVE  NEGATIVE   Leukocytes, UA LARGE (*) NEGATIVE  URINE MICROSCOPIC-ADD ON     Status: Abnormal   Collection Time    10/22/12  5:26 PM      Result Value Range   Squamous Epithelial / LPF MANY (*) RARE   WBC, UA 21-50  <3 WBC/hpf   Bacteria, UA FEW (*) RARE   Urine-Other MUCOUS PRESENT    TSH     Status: None   Collection Time    10/22/12  8:16 PM      Result Value Range   TSH 0.804  0.350 - 4.500 uIU/mL  VITAMIN B12     Status: None   Collection Time    10/22/12  8:16 PM      Result Value Range   Vitamin B-12 470  211 - 911 pg/mL  FOLATE     Status: None   Collection Time    10/22/12  8:16 PM      Result Value Range   Folate >20.0     Comment: (NOTE)     Reference Ranges            Deficient:       0.4 - 3.3 ng/mL            Indeterminate:   3.4 - 5.4 ng/mL            Normal:              > 5.4 ng/mL  IRON AND TIBC     Status: Abnormal   Collection Time    10/22/12  8:16 PM      Result Value Range   Iron 11 (*) 42 - 135 ug/dL   TIBC 829 (*) 562 - 130 ug/dL   Saturation Ratios 2 (*) 20 - 55 %   UIBC 469 (*) 125 - 400 ug/dL  FERRITIN     Status: Abnormal   Collection Time    10/22/12  8:16 PM      Result Value Range   Ferritin 8 (*) 10 - 291 ng/mL  RETICULOCYTES     Status: Abnormal   Collection Time    10/22/12  8:16 PM       Result Value Range   Retic Ct Pct 2.8  0.4 - 3.1 %   RBC. 2.95 (*) 3.87 - 5.11 MIL/uL   Retic Count, Manual 82.6  19.0 - 186.0 K/uL  PREPARE RBC (CROSSMATCH)     Status: None   Collection Time    10/22/12  8:30 PM      Result Value Range   Order Confirmation ORDER PROCESSED BY BLOOD BANK    CBC     Status: Abnormal   Collection Time    10/23/12  6:12 AM      Result Value Range   WBC 8.9  4.0 - 10.5 K/uL   RBC 3.33 (*) 3.87 - 5.11 MIL/uL   Hemoglobin 8.8 (*) 12.0 - 15.0 g/dL   HCT 86.5 (*) 78.4 - 69.6 %   MCV 79.9  78.0 - 100.0 fL   MCH 26.4  26.0 - 34.0 pg   MCHC 33.1  30.0 - 36.0 g/dL   RDW 29.5  11.5 - 15.5 %   Platelets 315  150 - 400 K/uL  BASIC METABOLIC PANEL     Status: Abnormal   Collection Time    10/23/12  6:12 AM      Result Value Range   Sodium 138  135 - 145 mEq/L   Potassium 3.4 (*) 3.5 - 5.1 mEq/L   Chloride 100  96 - 112 mEq/L   CO2 27  19 - 32 mEq/L   Glucose, Bld 123 (*) 70 - 99 mg/dL   BUN 22  6 - 23 mg/dL   Creatinine, Ser 1.61  0.50 - 1.10 mg/dL   Calcium 9.6  8.4 - 09.6 mg/dL   GFR calc non Af Amer 49 (*) >90 mL/min   GFR calc Af Amer 57 (*) >90 mL/min   Comment:            The eGFR has been calculated     using the CKD EPI equation.     This calculation has not been     validated in all clinical     situations.     eGFR's persistently     <90 mL/min signify     possible Chronic Kidney Disease.  LIPID PANEL     Status: None   Collection Time    10/23/12  6:12 AM      Result Value Range   Cholesterol 122  0 - 200 mg/dL   Triglycerides 045  <409 mg/dL   HDL 67  >81 mg/dL   Total CHOL/HDL Ratio 1.8     VLDL 23  0 - 40 mg/dL   LDL Cholesterol 32  0 - 99 mg/dL   Comment:            Total Cholesterol/HDL:CHD Risk     Coronary Heart Disease Risk Table                         Men   Women      1/2 Average Risk   3.4   3.3      Average Risk       5.0   4.4      2 X Average Risk   9.6   7.1      3 X Average Risk  23.4   11.0                 Use the calculated Patient Ratio     above and the CHD Risk Table     to determine the patient's CHD Risk.                ATP III CLASSIFICATION (LDL):      <100     mg/dL   Optimal      191-478  mg/dL   Near or Above                        Optimal      130-159  mg/dL   Borderline      295-621  mg/dL   High      >308     mg/dL   Very High    No results found.  Review of Systems  Constitutional: Positive for malaise/fatigue. Negative for fever, chills and diaphoresis.  Respiratory: Negative for shortness of breath.   Cardiovascular: Negative for chest pain, palpitations, orthopnea, claudication, leg swelling and PND.  Gastrointestinal: Positive for blood in stool and melena.  Neurological: Positive for dizziness and weakness. Negative for loss of consciousness.   Blood pressure 141/52, pulse 81, temperature 98 F (36.7 C), temperature source Oral, resp. rate 16, height 5' (1.524 m), weight 155 lb (70.308 kg), SpO2 98.00%. Physical Exam  Constitutional: [name] is oriented to person, place, and time. [name] appears well-developed and well-nourished. No distress.  HENT:  Head: Normocephalic and atraumatic.  Eyes: EOM are normal.  Neck: No JVD present.  Cardiovascular: Normal rate, regular rhythm, normal heart sounds and intact distal pulses.  Exam reveals no gallop and no friction rub.   No murmur heard. Respiratory: Effort normal and breath sounds normal. No respiratory distress. [name] has no wheezes. [name] has no rales.  Neurological: [name] is alert and oriented to person, place, and time.  Skin: Skin is warm and dry. [name] is not diaphoretic.  Psychiatric: [name] has a normal mood and affect. Her behavior is normal.    Assessment/Plan: Principal Problem:   GI bleed Active Problems:   HYPERLIPIDEMIA   HYPERTENSION   CAD, OM2 HSRA 7/02, OM2 Stent 9/02, ISR 11/03. RCA DES 1 /14/14   Anemia   Asthma   Acute renal failure  Plan:  Pt admitted for GI bleed. Pt had PCI + stenting w/ DES to RCA  3 months ago and has since been on DAPT with ASA + Plavix. Pt will undergo an EGD today to assess for upper GI bleed. ASA and Plavix are both being held. [name] denies any current or recent chest pain.  Recommendation for DES is antiplatelet therapy for at least 12 months. Would recommend continuing Plavix once stable and discontinuing ASA. Pt should also be on a PPI to prevent recurrent GI bleeding. Pantoprazole is the recommended PPI to be used with Plavix. Avoid concomitant use of Plavix with omeprazole or esomeprazole because both significantly reduce the antiplatelet activity of Plavix.  MD to follow with further recommendation.   Allayne Butcher, PA-C 10/23/2012, 10:48 AM   I have seen and evaluated the patient this AM along with Boyce Medici, PA. I agree with her findings, examination as well as impression recommendations.  The patient is well known to me - recent DES PCI in Jan 2014.  Unfortunately admitted with anemia - likely GI Bleed. No angina.  We are asked to consult re: plans for antiplatelet coverage.  GI has mentioned that [name] can restart DAPT.   I think that holding Plavix for 3-4 days to allow for hemostasis is acceptable.  After that time, would simply restart Plavix alone without ASA. Also with Protonix as PPI.  I otherwise agree with Ms. Simmons's recommendations.   Marykay Lex, M.D., M.S. [facility] [address] [place], [place]  [address]  [phone] Pager # [phone] 10/23/2012 6:23 PM

## 2012-10-23 NOTE — Progress Notes (Signed)
Patient evaluated for community based chronic disease management services with [facility] Care Management Program as a benefit of patient's [employer].  Spoke with patient at bedside to explain [facility] Care Management services. Patient has declined services at this time, but is aware that she can activate them post discharge.  Left contact information and [facility] literature at bedside. Made inpatient Case Manager Tomi Bamberger aware of [facility] Care Management consult. Of note, [facility] Care Management services does not replace or interfere with any services that are arranged by inpatient case management or social work.  For additional questions or referrals please contact Anibal Henderson BSN RN [facility] Suite Liaison at [phone].

## 2012-10-23 NOTE — Progress Notes (Signed)
UR Completed Aidian Salomon Graves-Bigelow, RN,BSN [phone]  

## 2012-10-24 ENCOUNTER — Encounter (HOSPITAL_COMMUNITY): Payer: Self-pay | Admitting: Gastroenterology

## 2012-10-24 DIAGNOSIS — D649 Anemia, unspecified: Secondary | ICD-10-CM | POA: Diagnosis not present

## 2012-10-24 DIAGNOSIS — K922 Gastrointestinal hemorrhage, unspecified: Secondary | ICD-10-CM | POA: Diagnosis not present

## 2012-10-24 DIAGNOSIS — N179 Acute kidney failure, unspecified: Secondary | ICD-10-CM | POA: Diagnosis not present

## 2012-10-24 DIAGNOSIS — J45909 Unspecified asthma, uncomplicated: Secondary | ICD-10-CM | POA: Diagnosis not present

## 2012-10-24 LAB — CBC
MCHC: 33 g/dL (ref 30.0–36.0)
Platelets: 341 10*3/uL (ref 150–400)
RDW: 14.3 % (ref 11.5–15.5)
WBC: 7.8 10*3/uL (ref 4.0–10.5)

## 2012-10-24 MED ORDER — CLOPIDOGREL BISULFATE 75 MG PO TABS: 75.0000 mg | ORAL_TABLET | Freq: Every day | ORAL | Status: DC

## 2012-10-24 MED ORDER — PANTOPRAZOLE SODIUM 40 MG PO TBEC: 40.0000 mg | DELAYED_RELEASE_TABLET | Freq: Two times a day (BID) | ORAL | Status: DC

## 2012-10-24 MED ORDER — FERROUS SULFATE 325 (65 FE) MG PO TBEC: 325.0000 mg | DELAYED_RELEASE_TABLET | Freq: Two times a day (BID) | ORAL | Status: DC

## 2012-10-24 NOTE — Progress Notes (Signed)
Looks well today. H/H stable.   EGD uneventful -- plan is OP Capsule Endoscopy. Anticipate d/c later today or in AM as long as no further GI Bleed.  Stable on current cardiac BP meds.  As per yesterday's note, would allow for ~5 days off DAPT, then restart Oral Clopidogrel only with PPI (Protonix as opposed to omeprazole, esomeprazole).  I have discussed plan with Dr. Gwenlyn Perking.  He will contact out In-Hospital PA for assistance arranging ROV appt with Dr. Alanda Amass in the next 1-2 weeks.  Marykay Lex, M.D., M.S. [facility] [address] [place], [place]  [address]  [phone] Pager # [phone] 10/24/2012 9:54 AM

## 2012-10-24 NOTE — Progress Notes (Signed)
     [facility] Daily Rounding Note 10/24/2012, 8:33 AM  SUBJECTIVE:       Feels better, breathing improved but still not back to completel baseline.  Eating well.  No belly pain.  OBJECTIVE:         Vital signs in last 24 hours:    Temp:  [97.7 F (36.5 C)-98.3 F (36.8 C)] 97.7 F (36.5 C) (04/24 1607) Pulse Rate:  [81-85] 85 (04/24 2245) Resp:  [12-34] 18 (04/24 1607) BP: (103-154)/(47-103) 113/70 mmHg (04/24 2245) SpO2:  [95 %-100 %] 98 % (04/24 1607) Last BM Date: 10/23/12 General: looks well.  No distress, comfortable.  Heart: RRR.  No MRG Chest: clear, not SOB Abdomen: soft, NT, ND.  Active BS  Extremities: no CCE Neuro/Psych:  Pleasant, no anxiety or agitation.  Fully alert and oriented.   Intake/Output from previous day: 04/24 0701 - 04/25 0700 In: 720 [P.O.:720] Out: 1 [Urine:1]  Intake/Output this shift:    Lab Results:  Recent Labs  10/22/12 1327 10/23/12 0612  WBC 10.8* 8.9  HGB 8.4* 8.8*  HCT 26.4* 26.6*  PLT 404* 315   BMET  Recent Labs  10/22/12 1327 10/23/12 0612  NA 138 138  K 4.0 3.4*  CL 102 100  CO2 25 27  GLUCOSE 125* 123*  BUN 29* 22  CREATININE 1.30* 1.03  CALCIUM 10.1 9.6   LFT  Recent Labs  10/22/12 1327  PROT 7.7  ALBUMIN 3.9  AST 19  ALT 12  ALKPHOS 60  BILITOT 0.7    ASSESMENT: * Iron deficiency anemia. S/P one PRBC and parenteral iron. No CBC today.  *  EGD 10/23/12:  Normal study.  * CAD. Previous BMS and 07/2012 DES to RCA. On Brilinta 07/2012 - 09/26/12, on Plavix therafter. Both meds seem to be associated with worsening DOE.  * Lung nodules. ? Are these also responsible for her dyspnea along with the anemia?  * Hx adenocarcinoma of colon. 12/2011 right hemicolectomy. No adjuvant chemoradiation.  * Diminished appetite and 25# weight loss. No reason for this based on EGD.  * Renal insufficiency. BUN may be up due to UGI bleeding    PLAN: *  10/30/12 capsule endoscopy at [facility] office.  Instructions given to pt.   *  To restart Plavix 4/29:  CBCs need close monitoring. *  Dr Leone Payor will determine timing of colonoscopy.  *  Stopped Protonix, no need for this med with normal EGD and lack of GER sxs.  *  Advised her not to start po Iron until after the CE study completed.    LOS: 2 days   Jennye Moccasin  10/24/2012, 8:33 AM Pager: [phone]  I have personally taken an interval history, reviewed the chart, and examined the patient.  I agree with the extender's note, impression and recommendations.  Barbette Hair. Arlyce Dice, MD, [facility] [phone]

## 2012-10-24 NOTE — Telephone Encounter (Signed)
Patient is scheduled for capsule endo with Jennye Moccasin PA.  She will explain the procedures and provide her the instructions.  I have also mailed a copy to the patient's home address.  She is scheduled for 10/30/12 8:00

## 2012-10-24 NOTE — Discharge Summary (Signed)
Physician Discharge Summary  KALANA YUST ZOX:[id number] DOB: 1927/10/13 DOA: 10/22/2012  PCP: Ginette Otto, MD  Admit date: 10/22/2012 Discharge date: 10/24/2012  Time spent: >30 minutes  Recommendations for Outpatient Follow-up:  BMET to follow kidney dysfunction CBC to follow Hgb CT lungs in 6 months to follow lung nodules (August)  Discharge Diagnoses:  Principal Problem:   GI bleed Active Problems:   HYPERLIPIDEMIA   HYPERTENSION   CAD, OM2 HSRA 7/02, OM2 Stent 9/02, ISR 11/03. RCA DES 1 /14/14   Anemia   Asthma   Acute renal failure   Discharge Condition: stable and improved. Will be discharge home and will follow with cardiology on 4/29; PCP in 1 week and GI on 10/30/12 for capsule endoscopy.  Diet recommendation: heart healthy diet  Filed Weights   10/22/12 1949 10/23/12 0514  Weight: 70.534 kg (155 lb 8 oz) 70.308 kg (155 lb)    History of present illness:  79 y.o. female with PMH significant for asthma, HLD, HTN, CAD initially on brilanta and recently changed to plavix; came to ED complaining of increase weakness, fatigue and black stool. Patient reports that after she was transitioned to plavix in March her stool has been black and she has progressively over this time feeling weaker and fatigue. Some associated SOB especially on exertion (but per patient not totally new, as she experienced even mor symptomatic SOB while on brilanta). In ED Hgb found to 8.4 (down from 12.7) and with positive FOBT.    Hospital Course:  1-GI bleed: patient with black stools for 2-3 weeks; also with positive FOBT in ED and decrease in her Hgb from 12.6 down to 8.4. After 1 unit of PRBC and IV iron infusion Hgb 9.8 at discharge.  -EGD negative -plan is for capsule endoscopy as an outpatient.  -hemodynamically stable  -Ferritin 7 ; received iv iron. Will continue BID ferrous sulfate. -continue holding plavix and ASA  -continue protonix BID  2-HYPERLIPIDEMIA: continue statins    3-HYPERTENSION: stable. Will resume home medications. Follow and adjustment of her dose as needed in outpatient setting.  4-CAD, OM2 HSRA 7/02, OM2 Stent 9/02, ISR 11/03. RCA DES 1 /14/14: will hold plavix and ASA due to GI bleed. Cardiology consulted for guidance after stopping meds and after bleeding is controlled.  -continue B-blocker and statins  -plan is to resume plavix after a total of 5 days holding it. Resume on 10/28/12.(needs at least 6-12 months of antiplatelets therapy)  -will discontinue ASA  5-Anemia due blood loss: confirmed by anemia panel.  -received 1 unit of PRBC and 1G of IV iron. -at discharge Hgb 9.8 -continue PO ferrous sulfate and close follow up of her Hgb level as an outpatient.  6-Asthma: stable. No wheezing. Continue PRN albuterol   7-Acute renal failure: secondary to decrease perfusion with anemia and continue use of HCTZ and lisinopril.  -UA no demonstrating infection  -Cr back to normal -resume antihypertensive agents  8-Grade 1 diastolic CHF: compensated. Will follow daily weight and strict intake and output. Last EF was 55-60% on 08/2012   9-Small lung nodules: plan is for repeat CT in August 2014 as recommended by radiology. No signs of infection and denying significant SOB, weight loss or any other complaints.   Procedures: EGD (negative and WNL)  Consultations:  GI  Cardiology  Discharge Exam: Filed Vitals:   10/23/12 1540 10/23/12 1607 10/23/12 2245 10/24/12 0939  BP: 118/54 103/50 113/70 114/69  Pulse:  82 85 85  Temp:  97.7 F (  36.5 C)    TempSrc:  Oral    Resp: 16 18    Height:      Weight:      SpO2: 96% 98%      General: AAOX3, NAD, afebrile; feeling stronger Cardiovascular: S1 and S2, no rubs or gallops Respiratory: CTA bilaterally Abdomen: soft, NT, ND, positive BS Neuro: non focal  Discharge Instructions  Discharge Orders   Future Appointments Provider Department Dept Phone   10/30/2012 8:00 AM [facility] [phone]   Future Orders Complete By Expires     Diet - low sodium heart healthy  As directed     Discharge instructions  As directed     Comments:      Take medications as prescribed Follow with PCP in 1 week Keep your appointment and follow with Dr. Alanda Amass on 10/28/12 Follow with Gastroenterology as instructed for outpatient capsule endoscopy        Medication List    STOP taking these medications       aspirin 81 MG tablet     Ticagrelor 90 MG Tabs tablet  Commonly known as:  BRILINTA      TAKE these medications       acetaminophen 325 MG tablet  Commonly known as:  TYLENOL  Take 650 mg by mouth every 6 (six) hours as needed. For pain     albuterol 108 (90 BASE) MCG/ACT inhaler  Commonly known as:  PROVENTIL HFA;VENTOLIN HFA  Inhale 2 puffs into the lungs every 4 (four) hours as needed. For shortness of breath     BIOTIN PO  Take 1 tablet by mouth daily.     CALCIUM + D PO  Take 1 tablet by mouth daily. Calcium 500mg  with Vitamin D     cetirizine 10 MG tablet  Commonly known as:  ZYRTEC  Take 5 mg by mouth 2 (two) times daily.     cholecalciferol 1000 UNITS tablet  Commonly known as:  VITAMIN D  Take 1,000 Units by mouth daily.     clopidogrel 75 MG tablet  Commonly known as:  PLAVIX  Take 1 tablet (75 mg total) by mouth daily. Stop until Monday 10/27/12     ferrous sulfate 325 (65 FE) MG EC tablet  Take 1 tablet (325 mg total) by mouth 2 (two) times daily.     lovastatin 20 MG 24 hr tablet  Commonly known as:  ALTOPREV  Take 20 mg by mouth at bedtime.     metoprolol 50 MG tablet  Commonly known as:  LOPRESSOR  Take 25 mg by mouth 2 (two) times daily.     nitroGLYCERIN 0.4 MG SL tablet  Commonly known as:  NITROSTAT  Place 1 tablet (0.4 mg total) under the tongue every 5 (five) minutes as needed for chest pain.     oxymetazoline 0.05 % nasal spray  Commonly known as:  AFRIN  Place 2 sprays into the  nose 2 (two) times daily.     pantoprazole 40 MG tablet  Commonly known as:  PROTONIX  Take 1 tablet (40 mg total) by mouth 2 (two) times daily.     triamterene-hydrochlorothiazide 37.5-25 MG per capsule  Commonly known as:  DYAZIDE  Take 1 capsule by mouth every morning.       Follow-up Information   Follow up with Ginette Otto, MD. Schedule an appointment as soon as possible for a visit in 1 week.   Contact information:   [address] [place] [address] [phone]       Follow up with Governor Rooks, MD On 10/28/2012.   Contact information:   [address] [place] [address] [phone]       Follow up with Jennye Moccasin, PA-C On 10/30/2012. (for capsule endoscopy)    Contact information:   [address] [place] [address] [phone]        The results of significant diagnostics from this hospitalization (including imaging, microbiology, ancillary and laboratory) are listed below for reference.    Significant Diagnostic Studies: Ct Angio Chest Pe W/cm &/or Wo Cm  09/26/2012  *RADIOLOGY REPORT*  Clinical Data: 79 year old with shortness of breath and upper chest pain.  History of coronary artery disease and heart failure.  CT ANGIOGRAPHY CHEST  Technique:  Multidetector CT imaging of the chest using the standard protocol during bolus administration of intravenous contrast. Multiplanar reconstructed images including MIPs were obtained and reviewed to evaluate the vascular anatomy.  Contrast:  100 ml Omnipaque-350  Comparison: Chest radiograph 07/14/2012  Findings: The main pulmonary artery is enlarged measuring up to 3.1 cm.  No evidence for pulmonary embolism.  Incidentally, the common carotid arteries are posterior to the pharynx and near the midline. Descending thoracic aorta measures 2.5 cm.  No evidence for an aortic dissection.  There is diffuse fullness or nodularity of the left adrenal gland which could  represent hyperplasia.  Mild fullness in the right adrenal gland.  No gross abnormality to the visualized liver and spleen.  No significant mediastinal, hilar or axillary lymphadenopathy.  Trachea and mainstem bronchi are patent.  There are scattered punctate nodules in the right upper lobe measuring between 2-3 mm. This is well seen on sequence #6, image 20 and image number 31. There is a 3-4 mm nodule in the posterior right lung base on image 85.  6 mm nodule in the right lower lobe on image 58. Additional nodule on image 52 that roughly measures 6 mm.  There are a few punctate nodular densities in left lower lobe.  There are severe degenerative changes and joint space narrowing in both shoulders. Multilevel degenerative changes in the thoracic spine.  IMPRESSION: Negative for pulmonary embolism.  The main pulmonary artery is enlarged and raises concern for pulmonary hypertension.  Thickening and nodularity of the adrenal glands probably represents hyperplasia.  There are scattered small nodular densities throughout the lungs, particularly the right lung.  Largest nodules roughly measures 6 mm.  Suspect that these nodules are inflammatory or post inflammatory in etiology, but indeterminate.  Recommend follow-up. If the patient is at high risk for bronchogenic carcinoma, follow- up chest CT at 6-12 months is recommended.  If the patient is at low risk for bronchogenic carcinoma, follow-up chest CT at 12 months is recommended.  This recommendation follows the consensus statement: Guidelines for Management of Small Pulmonary Nodules Detected on CT Scans: A Statement from the Fleischner Society as published in Radiology 2005; 237:395-400.   Original Report Authenticated By: Richarda Overlie, M.D.    US Abdomen Complete  10/10/2012  *RADIOLOGY REPORT*  Clinical Data:  Abdominal swelling  ABDOMINAL ULTRASOUND COMPLETE  Comparison:  Prior CT abdomen/pelvis 11/22/2011  Findings:  Gallbladder:  Surgically removed  Common Bile  Duct:  Within normal limits in caliber at 6 mm at the pancreatic head.  Slightly more prominent proximally at 8 mm.  Liver: Circumscribed anechoic simple cyst measures 3.2 x 2.8 x 3.9 cm in the inferior  aspect of hepatic segment six.  This is not significantly changed compared to prior CT scan.  Normal parenchymal echogenicity.  IVC:  Appears normal.  Pancreas:  Partially obscured by overlying bowel gas.  Spleen:  Within normal limits in size (8.4 cm) and echotexture.  Right kidney:  Normal in size (9.3 cm) and parenchymal echogenicity.  No evidence of mass or hydronephrosis.  Left kidney:  Normal in size (10.4 cm) and parenchymal echogenicity.  No evidence of mass or hydronephrosis.  Abdominal Aorta:  No aneurysm identified. Scattered atherosclerotic calcifications noted throughout the abdominal aorta.  IMPRESSION:  1.  Cholecystectomy 2.  Stable simple hepatic cysts 3.  Negative for ascites 4.  Aortic atherosclerosis   Original Report Authenticated By: Malachy Moan, M.D.     Microbiology: Recent Results (from the past 240 hour(s))  URINE CULTURE     Status: None   Collection Time    10/22/12  5:26 PM      Result Value Range Status   Specimen Description URINE, CLEAN CATCH   Final   Special Requests NONE   Final   Culture  Setup Time 10/22/2012 19:38   Final   Colony Count NO GROWTH   Final   Culture NO GROWTH   Final   Report Status 10/23/2012 FINAL   Final     Labs: Basic Metabolic Panel:  Recent Labs Lab 10/22/12 1327 10/23/12 0612  NA 138 138  K 4.0 3.4*  CL 102 100  CO2 25 27  GLUCOSE 125* 123*  BUN 29* 22  CREATININE 1.30* 1.03  CALCIUM 10.1 9.6   Liver Function Tests:  Recent Labs Lab 10/22/12 1327  AST 19  ALT 12  ALKPHOS 60  BILITOT 0.7  PROT 7.7  ALBUMIN 3.9    Recent Labs Lab 10/22/12 1327  AMYLASE 94   CBC:  Recent Labs Lab 10/22/12 1327 10/23/12 0612 10/24/12 0840  WBC 10.8* 8.9 7.8  NEUTROABS 8.2*  --   --   HGB 8.4* 8.8* 9.8*  HCT 26.4*  26.6* 29.7*  MCV 81.2 79.9 80.7  PLT 404* 315 341   BNP: BNP (last 3 results)  Recent Labs  07/16/12 0704  PROBNP 145.4   Signed:  Aeryn Medici  [facility] 10/24/2012, 10:59 AM

## 2012-10-27 DIAGNOSIS — I10 Essential (primary) hypertension: Secondary | ICD-10-CM | POA: Diagnosis not present

## 2012-10-27 DIAGNOSIS — Z79899 Other long term (current) drug therapy: Secondary | ICD-10-CM | POA: Diagnosis not present

## 2012-10-27 DIAGNOSIS — I2510 Atherosclerotic heart disease of native coronary artery without angina pectoris: Secondary | ICD-10-CM | POA: Diagnosis not present

## 2012-10-29 ENCOUNTER — Telehealth: Payer: Self-pay

## 2012-10-29 NOTE — Telephone Encounter (Signed)
Per Dr. Leone Payor patient may not need the capsule endo that was ordered from the hospital last week.  He asked that I discuss with the patient.   She would like to cancel and come in and discuss before having capsule.  She will come in on 11/10/12 10:15 to discuss

## 2012-10-30 DIAGNOSIS — K5790 Diverticulosis of intestine, part unspecified, without perforation or abscess without bleeding: Secondary | ICD-10-CM

## 2012-10-30 HISTORY — PX: COLONOSCOPY: SHX174

## 2012-10-30 HISTORY — DX: Diverticulosis of intestine, part unspecified, without perforation or abscess without bleeding: K57.90

## 2012-11-10 ENCOUNTER — Ambulatory Visit (INDEPENDENT_AMBULATORY_CARE_PROVIDER_SITE_OTHER): Payer: Medicare Other | Admitting: Internal Medicine

## 2012-11-10 ENCOUNTER — Encounter: Payer: Self-pay | Admitting: Internal Medicine

## 2012-11-10 ENCOUNTER — Telehealth: Payer: Self-pay | Admitting: Internal Medicine

## 2012-11-10 VITALS — BP 128/62 | HR 74 | Ht 60.0 in | Wt 155.0 lb

## 2012-11-10 DIAGNOSIS — Z9861 Coronary angioplasty status: Secondary | ICD-10-CM | POA: Diagnosis not present

## 2012-11-10 DIAGNOSIS — K921 Melena: Secondary | ICD-10-CM

## 2012-11-10 DIAGNOSIS — I2510 Atherosclerotic heart disease of native coronary artery without angina pectoris: Secondary | ICD-10-CM

## 2012-11-10 DIAGNOSIS — D500 Iron deficiency anemia secondary to blood loss (chronic): Secondary | ICD-10-CM | POA: Diagnosis not present

## 2012-11-10 DIAGNOSIS — I129 Hypertensive chronic kidney disease with stage 1 through stage 4 chronic kidney disease, or unspecified chronic kidney disease: Secondary | ICD-10-CM | POA: Diagnosis not present

## 2012-11-10 DIAGNOSIS — D649 Anemia, unspecified: Secondary | ICD-10-CM | POA: Diagnosis not present

## 2012-11-10 DIAGNOSIS — M545 Low back pain: Secondary | ICD-10-CM | POA: Diagnosis not present

## 2012-11-10 DIAGNOSIS — H9190 Unspecified hearing loss, unspecified ear: Secondary | ICD-10-CM | POA: Diagnosis not present

## 2012-11-10 MED ORDER — NA SULFATE-K SULFATE-MG SULF 17.5-3.13-1.6 GM/177ML PO SOLN: ORAL | Status: DC

## 2012-11-10 NOTE — Patient Instructions (Addendum)
You have been scheduled for a colonoscopy with propofol. Please follow written instructions given to you at your visit today.  Please pick up your prep kit at the pharmacy within the next 1-3 days. If you use inhalers (even only as needed), please bring them with you on the day of your procedure. Your physician has requested that you go to www.startemmi.com and enter the access code given to you at your visit today. This web site gives a general overview about your procedure. However, you should still follow specific instructions given to you by our office regarding your preparation for the procedure.  You are to confirm with Dr. Pete Glatter that you may stay on your Plavix for the colonoscopy.  It is ok per Dr. Leone Payor. Thank you for choosing me and [facility].  Iva Boop, M.D., [facility]

## 2012-11-10 NOTE — Progress Notes (Addendum)
Subjective:    Patient ID: [name], female    DOB: 03-11-1928, 79 y.o.   MRN: [id number]  HPI Here for f/u from hospital. She presented with a significant and symptomatic anemia and was iron deficient. Treated with blood, IV iron. Had an EGD which was negative. Capsule endoscopy of small bowel recommended as an outpatient. She and I decided to discuss this.  She reports she had been having black and tarry stools prior to admit. Had started clopidogrel in winter after MI and drug-eluting stent in Jan. Says goal is for 6 months of clopidogrel before interruption or stopping.   She has been ok w/o any melena since dc. Was off clopidogrel for a while and back on. Says last Hgb 9.9 w/ Dr. Alanda Amass in past 1-2 weeks. + fatigue, no chest pain Did restart iron (Rx) and says stools became black so stopped - she is concerned she cannot tell difference between that and melena. Says OTC iron does not make stools black.  She does tell me her Hgb normalized after colon cancer resection and iron Tx last year. Last prior Hgb in June was still low (in Epic)  Allergies  Allergen Reactions  . Atorvastatin Shortness Of Breath  . Brilinta (Ticagrelor) Shortness Of Breath  . Clopidogrel Bisulfate Shortness Of Breath  . Codeine Nausea And Vomiting  . Sulfonamide Derivatives Other (See Comments)    Intestinal upset   Outpatient Prescriptions Prior to Visit  Medication Sig Dispense Refill  . acetaminophen (TYLENOL) 325 MG tablet Take 650 mg by mouth every 6 (six) hours as needed. For pain      . albuterol (PROVENTIL HFA;VENTOLIN HFA) 108 (90 BASE) MCG/ACT inhaler Inhale 2 puffs into the lungs every 4 (four) hours as needed. For shortness of breath      . BIOTIN PO Take 1 tablet by mouth daily.      . Calcium Carbonate-Vitamin D (CALCIUM + D PO) Take 1 tablet by mouth daily. Calcium 500mg  with Vitamin D      . cetirizine (ZYRTEC) 10 MG tablet Take 5 mg by mouth 2 (two) times daily.       .  cholecalciferol (VITAMIN D) 1000 UNITS tablet Take 1,000 Units by mouth daily.      . clopidogrel (PLAVIX) 75 MG tablet Take 1 tablet (75 mg total) by mouth daily. Stop until Monday 10/27/12      . lovastatin (ALTOPREV) 20 MG 24 hr tablet Take 20 mg by mouth at bedtime.      . metoprolol (LOPRESSOR) 50 MG tablet Take 25 mg by mouth 2 (two) times daily.       . nitroGLYCERIN (NITROSTAT) 0.4 MG SL tablet Place 1 tablet (0.4 mg total) under the tongue every 5 (five) minutes as needed for chest pain.  25 tablet  2  . oxymetazoline (AFRIN) 0.05 % nasal spray Place 2 sprays into the nose 2 (two) times daily.      . pantoprazole (PROTONIX) 40 MG tablet Take 1 tablet (40 mg total) by mouth 2 (two) times daily.  60 tablet  1  . triamterene-hydrochlorothiazide (DYAZIDE) 37.5-25 MG per capsule Take 1 capsule by mouth every morning.      . ferrous sulfate 325 (65 FE) MG EC tablet HOLDING) Take 1 tablet (325 mg total) by mouth 2 (two) times daily.  60 tablet  1   Past Medical History  Diagnosis Date  . Arthritis   . Asthma   . Adenomatous colon polyp MAY 2013  HOSPITALIZED AT [facility] WITH HGB 7 -TRANSFUSED-MASS FOUND IN ASCENDING COLON  . Diverticulosis   . HLD (hyperlipidemia)   . Obesity   . Internal hemorrhoids   . Osteoporosis   . Anemia   . Blood transfusion MAY 2013  . Heart attack 01/01/2001    AUG 2002 HEART STENT PLACEMENT  . Hypertension   . Complication of anesthesia 2009    HIP REPLACEMENT-PT HAD HARD TIME WAKING UP--FELT LIKE SHE COULDN'T BREATHE  . CAD (coronary artery disease)     "SINGLE VESSEL CORONARY DISEASE WITH CX-OM-2 STENTING IN 2002 AND CARDIAC BALLOON ATHERECTOMY FOR IN-STENT RESTENOSIS IN 2003 ICH WAS WIDELY PATENT ON CATHETERIZATION IN 2004 " - PER CARDIOLOGY OFFICE NOTE 11/23/11 FROM DR. Alanda Amass  . Cancer of colon       . Multiple lung nodules 08/2012    scattered, bilateral but right>left.   [name] Kitchen DJD (degenerative joint disease)     of spine   Past Surgical History   Procedure Laterality Date  . Appendectomy    . Cholecystectomy    . Coronary angioplasty with stent placement    . Colonoscopy w/ biopsies and polypectomy  06/15/2008    adenomatous polyps, diverticulosis, internal hemorrhoids  . Total hip arthroplasty  09/08/2007    left, Dr. Lequita Halt  . Colonoscopy  11/16/2011    Procedure: COLONOSCOPY;  Surgeon: Iva Boop, MD;  Location: [facility] ENDOSCOPY;  Service: Endoscopy;  Laterality: N/A;  . Knee replacement  2003    left  . Joint replacement  2003    RT HIP 2003  L KNEE 2003  LEFT HIP 2009  . Esophagogastroduodenoscopy N/A 10/23/2012    Procedure: ESOPHAGOGASTRODUODENOSCOPY (EGD);  Surgeon: Louis Meckel, MD;  Location: [facility] ENDOSCOPY;  Service: Endoscopy;  Laterality: N/A;   History   Social History  . Marital Status: Married    Spouse Name: N/A    Number of Children: 2  . Years of Education: N/A   Occupational History  . retired    Social History Main Topics  . Smoking status: Former Smoker -- 30 years    Types: Cigarettes    Quit date: 11/23/1982  . Smokeless tobacco: Never Used  . Alcohol Use: Yes     Comment: occ wine  . Drug Use: No  . Sexually Active: None   Other Topics Concern  . None   Social History Narrative   Married, Agricultural consultant at [employer]   Review of Systems As above. Pulmonary nodules on CT    Objective:   Physical Exam General:  NAD Eyes:   anicteric Lungs:  clear Heart:  S1S2 no rubs, murmurs or gallops Abdomen:  soft and nontender, BS+, small incisional hernia Ext:   no edema    Data Reviewed:  Hospital notes, EGD Lab Results  Component Value Date   WBC 7.8 10/24/2012   HGB 9.8* 10/24/2012   HCT 29.7* 10/24/2012   MCV 80.7 10/24/2012   PLT 341 10/24/2012   Lab Results  Component Value Date   FERRITIN 8* 10/22/2012       Assessment & Plan:  Iron deficiency anemia secondary to blood loss (chronic)  Melena  CAD S/P percutaneous coronary intervention - DES on clopidogrel  Hx colon  cancer and resection 2013  1. She is improved overall 2. Cause of anemia and reported melena not clear - EGD negative at hospital.  3. We discussed pros/cons of capsule endoscopy vs. Colonoscopy and have decided to proceed with colonoscopy while on clopidogrel 4.  She and I are not certain how much could be done with findings at [facility] capsule endoscopy - and she is 1 year out from colon cancer dx. She could have recrurent polyps or perhaps anastomotic ulcers and right colon bleed can cause melena. Larger polyps not able to be removed on clopidogrel - so she could need a repeat colonoscopy to remove them when off clopidogrel later (was going to be on x 6 months) - but should be unlikely. Have gone ahead and scheduled colonoscopy. The risks and benefits as well as alternatives of endoscopic procedure(s) have been discussed and reviewed. All questions answered. The patient agrees to proceed. 5. She will discuss with Dr. Pete Glatter also and we can wait until July if she decides, I lean towards going ahead now (colonoscopy) and then decide about capsule endoscopy. Will defer anemia f/u to him as he may need other labs today.   AV:[id number],NWG THOMAS, MD  Also need to consider resuming 81 mg ASA - do not think that is a problem now, either.

## 2012-11-10 NOTE — Telephone Encounter (Signed)
Spoke to [place] and told her I am trying to get her a suprep sample kit and I will be back in touch with her in the next day or so.

## 2012-11-12 ENCOUNTER — Telehealth: Payer: Self-pay

## 2012-11-12 NOTE — Telephone Encounter (Signed)
Spoke to [place] and told her to hold her Plavix starting today 11/12/12 per Dr. Leone Payor and to start taking a baby ASA 81mg   Daily.   Her sample kit of Suprep is coming hopefully tomorrow and I will call her.  Dr. Leone Payor will call Dr. Alanda Amass 11/13/12 to confirm about holding Plavix for 5 days.

## 2012-11-12 NOTE — Telephone Encounter (Signed)
Message copied by [place], Drishti Pepperman E on Wed Nov 12, 2012  4:54 PM ------      Message from: Stan Head E      Created: Wed Nov 12, 2012 12:25 PM       He is off today      I will call again tomorrow - (REMIND ME)            Have her start holding it      Have her start 81 mg ASA daily now            Will work out if 5 days ok (then can remove polyps, etc)      If not will restart before 3 days up            ----- Message -----         From: Rolen Conger E [place], CMA         Sent: 11/12/2012   8:52 AM           To: Iva Boop, MD            Please call Dr. Alanda Amass at [phone].  He has approved Caelynn holding her Plavix for 3-4 days prior to colonoscopy to be done 11/17/12.  Let me know if ok to holding for 5 days.       ------

## 2012-11-13 ENCOUNTER — Telehealth: Payer: Self-pay

## 2012-11-13 NOTE — Telephone Encounter (Signed)
[name] informed to stay on the Plavix and ASA per Dr. Leone Payor.  She verbalized understanding .  I also let her know that the suprep kit came in for them to pick up today or tomorrow.

## 2012-11-13 NOTE — Telephone Encounter (Signed)
Message copied by [place], Coal Nearhood E on Thu Nov 13, 2012  2:40 PM ------      Message from: Stan Head E      Created: Thu Nov 13, 2012  2:34 PM       Have her stay on the Plavix (and ASA)      ----- Message -----         From: Eian Vandervelden E [place], CMA         Sent: 11/12/2012   8:52 AM           To: Iva Boop, MD            Please call Dr. Alanda Amass at [phone].  He has approved Mckynna holding her Plavix for 3-4 days prior to colonoscopy to be done 11/17/12.  Let me know if ok to holding for 5 days.       ------

## 2012-11-13 NOTE — Telephone Encounter (Signed)
Per Dr. Marvell Fuller will stay on her Plavix and ASA for her colonoscopy.  Patient informed and verbalized understanding.

## 2012-11-17 ENCOUNTER — Ambulatory Visit (AMBULATORY_SURGERY_CENTER): Payer: Medicare Other | Admitting: Internal Medicine

## 2012-11-17 ENCOUNTER — Encounter: Payer: Self-pay | Admitting: Internal Medicine

## 2012-11-17 VITALS — BP 143/69 | HR 78 | Temp 98.1°F | Resp 15 | Ht 60.0 in | Wt 155.0 lb

## 2012-11-17 DIAGNOSIS — Z85038 Personal history of other malignant neoplasm of large intestine: Secondary | ICD-10-CM

## 2012-11-17 DIAGNOSIS — D649 Anemia, unspecified: Secondary | ICD-10-CM | POA: Diagnosis not present

## 2012-11-17 DIAGNOSIS — F959 Tic disorder, unspecified: Secondary | ICD-10-CM | POA: Diagnosis not present

## 2012-11-17 DIAGNOSIS — K5730 Diverticulosis of large intestine without perforation or abscess without bleeding: Secondary | ICD-10-CM | POA: Diagnosis not present

## 2012-11-17 DIAGNOSIS — I2510 Atherosclerotic heart disease of native coronary artery without angina pectoris: Secondary | ICD-10-CM | POA: Diagnosis not present

## 2012-11-17 DIAGNOSIS — I10 Essential (primary) hypertension: Secondary | ICD-10-CM | POA: Diagnosis not present

## 2012-11-17 DIAGNOSIS — D500 Iron deficiency anemia secondary to blood loss (chronic): Secondary | ICD-10-CM | POA: Diagnosis not present

## 2012-11-17 MED ORDER — SODIUM CHLORIDE 0.9 % IV SOLN: 500.0000 mL | INTRAVENOUS | Status: DC

## 2012-11-17 NOTE — Progress Notes (Signed)
Patient did not experience any of the following events: a burn prior to discharge; a fall within the facility; wrong site/side/patient/procedure/implant event; or a hospital transfer or hospital admission upon discharge from the facility. (G8907) Patient did not have preoperative order for IV antibiotic SSI prophylaxis. (G8918)  

## 2012-11-17 NOTE — Progress Notes (Addendum)
NO EGGS OR SOY ALLERGY. EWM  PT STATES SHE WAS TOLD TO NOT HOLD HER PLAVIX FOR THIS PROCEDURE, CONTINUE AS DIRECTED. EWM

## 2012-11-17 NOTE — Op Note (Signed)
[facility] [address] [place], [address]   COLONOSCOPY PROCEDURE REPORT  PATIENT: [name], [name]  MR#: [id number] BIRTHDATE: February 04, 1928 , 85  yrs. old GENDER: Female ENDOSCOPIST: Iva Boop, MD, [facility] PROCEDURE DATE:  11/17/2012 PROCEDURE:   Colonoscopy, diagnostic ASA CLASS:   Class III INDICATIONS:Iron Deficiency Anemia and High risk patient with personal history of colon cancer. MEDICATIONS: propofol (Diprivan) 200mg  IV  DESCRIPTION OF PROCEDURE:   After the risks benefits and alternatives of the procedure were thoroughly explained, informed consent was obtained.  A digital rectal exam revealed no abnormalities of the rectum.   The LB PFC-H190 [id number]  endoscope was introduced through the anus and advanced to the surgical anastomosis. No adverse events experienced.   The quality of the prep was excellent using Suprep  The instrument was then slowly withdrawn as the colon was fully examined.      COLON FINDINGS: There was evidence of a prior ileocolonic surgical anastomosis The finding was in the right colon.   Severe diverticulosis was noted throughout the entire examined colon. Otherwise normal colon.  Retroflexed views revealed internal hemorrhoids. The time to cecum=3 minutes 18 seconds.  Withdrawal time=5 minutes 45 seconds.  The scope was withdrawn and the procedure completed. COMPLICATIONS: There were no complications.  ENDOSCOPIC IMPRESSION: 1.   There was evidence of a prior ileocolonic surgical anastomosis in the right colon 2.   Severe diverticulosis was noted throughout the entire examined colon - excellent prep and no other abnormalities 3.   Internal hemorrhoids in rectum  RECOMMENDATIONS: 1.  Possible Repeat Colonoscopy in 3 years - age/health-permitting 2.   Iron supplement twice a day, monthly CBC through Dr.  Pete Glatter she has decided not to pursue a capsule endoscopy of the small bowel. If recurrent bleeding  reassess.   eSigned:  Iva Boop, MD, [facility] 11/17/2012 4:02 PM   cc: Merlene Laughter, MD, Susa Griffins, MD, and The Patient

## 2012-11-17 NOTE — Patient Instructions (Addendum)
The colonoscopy showed diverticulosis and small hemorrhoids. Otherwise ok. No cause of bleeding found though some people bleed from diverticulosis (usually red blood). I understand that you do not want to pursue the small bowel capsule endoscopy.  I recommend you resume iron supplements (2 a day) and have your blood counts checked monthly by Dr. Pete Glatter.  If you fail to get back to a normal hemoglobin or have bleeding, let me know and we can decide what to do.  I appreciate the opportunity to care for you.  Iva Boop, MD, FACG  YOU HAD AN ENDOSCOPIC PROCEDURE TODAY AT THE [facility]: Refer to the procedure report that was given to you for any specific questions about what was found during the examination.  If the procedure report does not answer your questions, please call your gastroenterologist to clarify.  If you requested that your care partner not be given the details of your procedure findings, then the procedure report has been included in a sealed envelope for you to review at your convenience later.  YOU SHOULD EXPECT: Some feelings of bloating in the abdomen. Passage of more gas than usual.  Walking can help get rid of the air that was put into your GI tract during the procedure and reduce the bloating. If you had a lower endoscopy (such as a colonoscopy or flexible sigmoidoscopy) you may notice spotting of blood in your stool or on the toilet paper. If you underwent a bowel prep for your procedure, then you may not have a normal bowel movement for a few days.  DIET: Your first meal following the procedure should be a light meal and then it is ok to progress to your normal diet.  A half-sandwich or bowl of soup is an example of a good first meal.  Heavy or fried foods are harder to digest and may make you feel nauseous or bloated.  Likewise meals heavy in dairy and vegetables can cause extra gas to form and this can also increase the bloating.  Drink plenty of fluids  but you should avoid alcoholic beverages for 24 hours.  ACTIVITY: Your care partner should take you home directly after the procedure.  You should plan to take it easy, moving slowly for the rest of the day.  You can resume normal activity the day after the procedure however you should NOT DRIVE or use heavy machinery for 24 hours (because of the sedation medicines used during the test).    SYMPTOMS TO REPORT IMMEDIATELY: A gastroenterologist can be reached at any hour.  During normal business hours, 8:30 AM to 5:00 PM Monday through Friday, call [phone].  After hours and on weekends, please call the GI answering service at [phone] who will take a message and have the physician on call contact you.   Following lower endoscopy (colonoscopy or flexible sigmoidoscopy):  Excessive amounts of blood in the stool  Significant tenderness or worsening of abdominal pains  Swelling of the abdomen that is new, acute  Fever of 100F or higher   FOLLOW UP: If any biopsies were taken you will be contacted by phone or by letter within the next 1-3 weeks.  Call your gastroenterologist if you have not heard about the biopsies in 3 weeks.  Our staff will call the home number listed on your records the next business day following your procedure to check on you and address any questions or concerns that you may have at that time regarding the information given  to you following your procedure. This is a courtesy call and so if there is no answer at the home number and we have not heard from you through the emergency physician on call, we will assume that you have returned to your regular daily activities without incident.  SIGNATURES/CONFIDENTIALITY: You and/or your care partner have signed paperwork which will be entered into your electronic medical record.  These signatures attest to the fact that that the information above on your After Visit Summary has been reviewed and is understood.  Full  responsibility of the confidentiality of this discharge information lies with you and/or your care-partner.  Diverticulosis, hemorrhoids-handouts given  Repeat colonoscopy in 3 years  Iron supplement twice a day, monthly CBC through Dr. Pete Glatter

## 2012-11-18 ENCOUNTER — Telehealth: Payer: Self-pay

## 2012-11-18 NOTE — Telephone Encounter (Signed)
  Follow up Call-  Call back number 11/17/2012  Post procedure Call Back phone  # [phone]  Permission to leave phone message Yes     Patient questions:  Do you have a fever, pain , or abdominal swelling? no Pain Score  0 *  Have you tolerated food without any problems? yes  Have you been able to return to your normal activities? yes  Do you have any questions about your discharge instructions: Diet   no Medications  no Follow up visit  no  Do you have questions or concerns about your Care? no  Actions: * If pain score is 4 or above: No action needed, pain <4.

## 2012-11-19 ENCOUNTER — Ambulatory Visit: Payer: Medicare Other | Admitting: Internal Medicine

## 2012-11-28 ENCOUNTER — Ambulatory Visit (INDEPENDENT_AMBULATORY_CARE_PROVIDER_SITE_OTHER): Payer: Medicare Other | Admitting: Surgery

## 2012-11-28 ENCOUNTER — Other Ambulatory Visit (INDEPENDENT_AMBULATORY_CARE_PROVIDER_SITE_OTHER): Payer: Self-pay

## 2012-11-28 ENCOUNTER — Encounter (INDEPENDENT_AMBULATORY_CARE_PROVIDER_SITE_OTHER): Payer: Self-pay | Admitting: Surgery

## 2012-11-28 VITALS — BP 110/72 | HR 72 | Temp 97.9°F | Resp 12 | Ht 60.0 in | Wt 155.0 lb

## 2012-11-28 DIAGNOSIS — Z96649 Presence of unspecified artificial hip joint: Secondary | ICD-10-CM | POA: Diagnosis not present

## 2012-11-28 DIAGNOSIS — K432 Incisional hernia without obstruction or gangrene: Secondary | ICD-10-CM

## 2012-11-28 NOTE — Progress Notes (Signed)
[name] DOB: April 07, 1928 MRN: [id number]                                                                                      DATE: 11/28/2012  PCP: Mary Otto, MD Referring Provider: Ginette Mcgrath, *  IMPRESSION:  Incisional hernia - difficult to feel the fascial defect Cardia stint placement in January  PLAN:   Discussed with her. Will get CT scan to evaluate the size of the defect and then make plans. She is now on Plavix from stint placed in January                 CC:  Chief Complaint  Patient presents with  . Other    Old incision hard and puffy    HPI:  [name] is a 79 y.o.  female who presents for evaluation of incisional problem. She noted a bulge in the upper part of her incision about three months ago. No other sx. Not getting bigger. Goes away when she lies down  PMH:  has a past medical history of Arthritis; Asthma; Adenomatous colon polyp (MAY 2013); Diverticulosis; HLD (hyperlipidemia); Obesity; Internal hemorrhoids; Osteoporosis; Anemia; Blood transfusion (MAY 2013); Heart attack (01/01/2001); Hypertension; Complication of anesthesia (2009); CAD (coronary artery disease); Cancer; Multiple lung nodules (08/2012); and DJD (degenerative joint disease).  PSH:   has past surgical history that includes Appendectomy; Cholecystectomy; Coronary angioplasty with stent; Colonoscopy w/ biopsies and polypectomy (06/15/2008); Total hip arthroplasty (09/08/2007); Colonoscopy (11/16/2011); knee replacement (2003); Joint replacement (2003); Esophagogastroduodenoscopy (N/A, 10/23/2012); Colonoscopy; and Polypectomy.  ALLERGIES:   Allergies  Allergen Reactions  . Atorvastatin Shortness Of Breath  . Brilinta (Ticagrelor) Shortness Of Breath  . Clopidogrel Bisulfate Shortness Of Breath  . Codeine Nausea And Vomiting  . Sulfonamide Derivatives Other (See Comments)    Intestinal upset    MEDICATIONS: Current outpatient prescriptions:acetaminophen  (TYLENOL) 325 MG tablet, Take 650 mg by mouth every 6 (six) hours as needed. For pain, Disp: , Rfl: ;  albuterol (PROVENTIL HFA;VENTOLIN HFA) 108 (90 BASE) MCG/ACT inhaler, Inhale 2 puffs into the lungs every 4 (four) hours as needed. For shortness of breath, Disp: , Rfl: ;  BIOTIN PO, Take 1 tablet by mouth daily., Disp: , Rfl:  Calcium Carbonate-Vitamin D (CALCIUM + D PO), Take 1 tablet by mouth daily. Calcium 500mg  with Vitamin D, Disp: , Rfl: ;  cetirizine (ZYRTEC) 10 MG tablet, Take 5 mg by mouth 2 (two) times daily. , Disp: , Rfl: ;  cholecalciferol (VITAMIN D) 1000 UNITS tablet, Take 1,000 Units by mouth daily., Disp: , Rfl: ;  clopidogrel (PLAVIX) 75 MG tablet, Take 1 tablet (75 mg total) by mouth daily. Stop until Monday 10/27/12, Disp: , Rfl:  fluticasone (FLONASE) 50 MCG/ACT nasal spray, Place 1 spray into the nose daily as needed. , Disp: , Rfl: ;  lovastatin (ALTOPREV) 20 MG 24 hr tablet, Take 20 mg by mouth at bedtime., Disp: , Rfl: ;  metoprolol (LOPRESSOR) 50 MG tablet, Take 25 mg by mouth 2 (two) times daily. , Disp: , Rfl: ;  pantoprazole (PROTONIX) 40 MG tablet, Take 1 tablet (40 mg total) by  mouth 2 (two) times daily., Disp: 60 tablet, Rfl: 1 traMADol (ULTRAM) 50 MG tablet, Take 50 mg by mouth every 6 (six) hours as needed. , Disp: , Rfl: ;  triamterene-hydrochlorothiazide (DYAZIDE) 37.5-25 MG per capsule, Take 1 capsule by mouth every morning., Disp: , Rfl:   ROS: She has filled out our 12 point review of systems and it is negative . EXAM:   BP 110/72  Pulse 72  Temp(Src) 97.9 F (36.6 C) (Oral)  Resp 12  Ht 5' (1.524 m)  Wt 155 lb (70.308 kg)  BMI 30.27 kg/m2: . General; Alert, oriented, NAD Abd soft. Well healed right paramedian incision with bulge in upper portion that reduces. I can't feel the fascial defect Extremities: Fragile skin with multiple bruises  DATA REVIEWED:  Old Epic notes    [name] [name] 11/28/2012  CC: [name], [name], *, Mary Otto, MD

## 2012-11-28 NOTE — Patient Instructions (Signed)
We will schedule a CT scan to evaluate the size and complexity of the hernia and make decisions and recommendations after that is done

## 2012-12-01 ENCOUNTER — Telehealth: Payer: Self-pay | Admitting: Cardiovascular Disease

## 2012-12-01 NOTE — Telephone Encounter (Signed)
Message forwarded to [facility]. Berlinda Last, LPN to discuss w/ Dr. Alanda Amass.  This note and paper chart on JC's desk.

## 2012-12-01 NOTE — Telephone Encounter (Signed)
The pharmacy can no longer get her Pantoprazole any longer-Is there something else Dr Alanda Amass wants her to take? Please call to advise!

## 2012-12-02 ENCOUNTER — Telehealth (INDEPENDENT_AMBULATORY_CARE_PROVIDER_SITE_OTHER): Payer: Self-pay

## 2012-12-02 NOTE — Telephone Encounter (Signed)
Patient called in anxious to hear from [place] regarding CT results. She says results were sent to [place] Friday. I told her I would send a message to his assistant to look for them and that we would call her back.

## 2012-12-02 NOTE — Telephone Encounter (Signed)
Pt. States pharmacy can't get pantoprazole in for refill instructed pt. To take omeprazole otc until prescribed med can be filled. Pt stated understanding of instructions

## 2012-12-02 NOTE — Telephone Encounter (Signed)
Please call ASAP with CT results when it arrives in Epic

## 2012-12-03 ENCOUNTER — Telehealth (INDEPENDENT_AMBULATORY_CARE_PROVIDER_SITE_OTHER): Payer: Self-pay | Admitting: *Deleted

## 2012-12-03 NOTE — Telephone Encounter (Signed)
CD of CT received from patient. I have called again to request CT report from [facility].

## 2012-12-03 NOTE — Telephone Encounter (Signed)
I received CT report. I made patient aware that it does show a hernia. I will have Dr Jamey Ripa review the report and films this afternoon and let her know the plan.

## 2012-12-03 NOTE — Telephone Encounter (Signed)
Patient called again requesting results of CT scan.  Explained to patient that we have not yet received her results from [facility] at this time for Dr. Jamey Ripa to review.  Patient states they told her they sent them however again explained that they have not gotten to Dr. Jamey Ripa or Lesly Rubenstein at this time.  Patient states she has a CD that they gave her which she will bring by.  Explained that we will still need the report but that would be helpful.  Patient states understanding at this time and agreeable.

## 2012-12-04 ENCOUNTER — Telehealth (INDEPENDENT_AMBULATORY_CARE_PROVIDER_SITE_OTHER): Payer: Self-pay | Admitting: Surgery

## 2012-12-04 NOTE — Telephone Encounter (Signed)
I spoke with her about CT report There is a 3.5 cm hernia with nearby transverse colon. She is asx and wants nothing done at this time. We discussed follow up in three months and coming to ed with any s/s of incarceration, such as pain, inability to reduce it etc. She has a 6mm right base of lung nodule incidentally noted and repeat ct in 3-6 months recommended. Discussed with her and alerted her to that. The was also a right adrenal nodule noted and non-enhanced ct recommended to evaluate that. Discussed with her and she would like to have that done. Told her I will have one of our other surgeon review her scan to see if hernia can be fixed laparoscopically

## 2012-12-04 NOTE — Telephone Encounter (Signed)
Discussed with her (see telephone note done today) Will see her in three months and go ahead and order non-enhanced abd CT to look at adrenal

## 2012-12-08 ENCOUNTER — Telehealth (INDEPENDENT_AMBULATORY_CARE_PROVIDER_SITE_OTHER): Payer: Self-pay | Admitting: General Surgery

## 2012-12-08 DIAGNOSIS — R911 Solitary pulmonary nodule: Secondary | ICD-10-CM

## 2012-12-08 DIAGNOSIS — R109 Unspecified abdominal pain: Secondary | ICD-10-CM

## 2012-12-08 NOTE — Telephone Encounter (Signed)
CT abdomen without contrast set up with [facility] on Thursday 12/11/2012 at 11:00 am to further look at adrenal nodule. LMOM for patient to call back and ask for me so I can make her aware of appt. To give her number for [facility] [phone] if she needs to reschedule. Awaiting call back.

## 2012-12-08 NOTE — Telephone Encounter (Signed)
Message copied by Liliana Cline on Mon Dec 08, 2012  9:11 AM ------      Message from: Currie Paris      Created: Thu Dec 04, 2012 10:47 AM       Yes, i think so      ----- Message -----         From: Liliana Cline, CMA         Sent: 12/04/2012  10:44 AM           To: Currie Paris, MD            Is a non-enhanced abd CT just a CT without contrast?      ----- Message -----         From: Currie Paris, MD         Sent: 12/04/2012  10:29 AM           To: Liliana Cline, CMA            She needs a non-enhanced abd ct at [facility] to follow up an adrenal nodule seen on the recent CT looking for her hernia.            She also needs a recall to see me in three months            If [facility] can also schedule chest ct in three months to follow up a lung nodule, that will also be helpful             ------

## 2012-12-08 NOTE — Telephone Encounter (Signed)
Spoke with patient and made her aware of date for CT - she will keep this date. She states she is on a blood thinner until the end of July. She wants to see one of the laparoscopic MD's around the middle of July to talk about setting up surgery. She will call back to make that appt and call with any other questions.

## 2012-12-09 ENCOUNTER — Encounter (INDEPENDENT_AMBULATORY_CARE_PROVIDER_SITE_OTHER): Payer: Self-pay

## 2012-12-11 DIAGNOSIS — E278 Other specified disorders of adrenal gland: Secondary | ICD-10-CM | POA: Diagnosis not present

## 2012-12-12 ENCOUNTER — Encounter (INDEPENDENT_AMBULATORY_CARE_PROVIDER_SITE_OTHER): Payer: Self-pay | Admitting: Surgery

## 2012-12-12 NOTE — Progress Notes (Signed)
Patient ID: [name], female   DOB: [birth year], 79 y.o.   MRN: [id number] Her F/U CT shows the adrenal lesion is almost certainly a benign adenoma. Should have f/u CT in 6-12 months to be sure size is stable.

## 2012-12-16 ENCOUNTER — Telehealth (INDEPENDENT_AMBULATORY_CARE_PROVIDER_SITE_OTHER): Payer: Self-pay | Admitting: *Deleted

## 2012-12-16 NOTE — Telephone Encounter (Signed)
Patient called to request CT results.  Dr. Tenna Child message from 6/13 was read to the patient who stated understanding.

## 2012-12-23 DIAGNOSIS — D649 Anemia, unspecified: Secondary | ICD-10-CM | POA: Diagnosis not present

## 2012-12-25 ENCOUNTER — Encounter (INDEPENDENT_AMBULATORY_CARE_PROVIDER_SITE_OTHER): Payer: Self-pay

## 2012-12-30 ENCOUNTER — Encounter (INDEPENDENT_AMBULATORY_CARE_PROVIDER_SITE_OTHER): Payer: Self-pay | Admitting: Surgery

## 2012-12-30 ENCOUNTER — Ambulatory Visit (INDEPENDENT_AMBULATORY_CARE_PROVIDER_SITE_OTHER): Payer: Medicare Other | Admitting: Surgery

## 2012-12-30 VITALS — BP 138/66 | HR 76 | Temp 99.0°F | Resp 20 | Ht 60.0 in | Wt 157.8 lb

## 2012-12-30 DIAGNOSIS — K432 Incisional hernia without obstruction or gangrene: Secondary | ICD-10-CM | POA: Diagnosis not present

## 2012-12-30 NOTE — Progress Notes (Signed)
Subjective:     Patient ID: [name], female   DOB: 08/03/1927, 79 y.o.   MRN: [id number]  HPI This is a patient of Dr. Tenna Child that has an incisional hernia. She had A laparoscopic-assisted right colon last year. The hernia is currently asymptomatic  Review of Systems     Objective:   Physical Exam On exam, she has an easily reducible hernia at the upper part of her incision    Assessment:     Incisional hernia     Plan:     I discussed this with her in detail and we will proceed with laparoscopic repair with mesh

## 2013-01-01 DIAGNOSIS — I2510 Atherosclerotic heart disease of native coronary artery without angina pectoris: Secondary | ICD-10-CM | POA: Diagnosis not present

## 2013-01-01 DIAGNOSIS — R0602 Shortness of breath: Secondary | ICD-10-CM | POA: Diagnosis not present

## 2013-01-01 DIAGNOSIS — J449 Chronic obstructive pulmonary disease, unspecified: Secondary | ICD-10-CM | POA: Diagnosis not present

## 2013-01-06 ENCOUNTER — Encounter: Payer: Self-pay | Admitting: Cardiovascular Disease

## 2013-01-13 ENCOUNTER — Encounter (INDEPENDENT_AMBULATORY_CARE_PROVIDER_SITE_OTHER): Payer: Self-pay

## 2013-01-20 ENCOUNTER — Encounter (HOSPITAL_COMMUNITY): Payer: Self-pay | Admitting: Pharmacy Technician

## 2013-01-20 NOTE — Patient Instructions (Signed)
LEINANI LISBON  01/20/2013   Your procedure is scheduled on:  01/29/13               Surgery 1000am-1130am  Report to [facility] at     0730 AM.  Call this number if you have problems the morning of surgery: [phone]   Remember:   Do not eat food or drink liquids after midnight.   Take these medicines the morning of surgery with A SIP OF WATER:    Do not wear jewelry, make-up or nail polish.  Do not wear lotions, powders, or perfumes.  Do not shave 48 hours prior to surgery. .  Do not bring valuables to the hospital.  Contacts, dentures or bridgework may not be worn into surgery.  Leave suitcase in the car. After surgery it may be brought to your room.  For patients admitted to the hospital, checkout time is 11:00 AM the day of  discharge.     SEE CHG INSTRUCTION SHEET    Please read over the following fact sheets that you were given: coughing and deep breathing exercises, leg exercises               Failure to comply with these instructions may result in cancellation of your surgery.                Patient Signature ____________________________              Nurse Signature _____________________________

## 2013-01-21 ENCOUNTER — Encounter (HOSPITAL_COMMUNITY): Payer: Self-pay

## 2013-01-21 ENCOUNTER — Encounter (HOSPITAL_COMMUNITY)
Admission: RE | Admit: 2013-01-21 | Discharge: 2013-01-21 | Disposition: A | Discharge status: Home / Self Care | Payer: Medicare Other | Source: Ambulatory Visit | Attending: Surgery | Admitting: Surgery

## 2013-01-21 DIAGNOSIS — K432 Incisional hernia without obstruction or gangrene: Secondary | ICD-10-CM | POA: Insufficient documentation

## 2013-01-21 DIAGNOSIS — Z01812 Encounter for preprocedural laboratory examination: Secondary | ICD-10-CM | POA: Insufficient documentation

## 2013-01-21 LAB — CBC
HCT: 39.1 % (ref 36.0–46.0)
Hemoglobin: 12.5 g/dL (ref 12.0–15.0)
MCH: 29.1 pg (ref 26.0–34.0)
MCHC: 32 g/dL (ref 30.0–36.0)
RDW: 15.3 % (ref 11.5–15.5)

## 2013-01-21 LAB — BASIC METABOLIC PANEL
BUN: 16 mg/dL (ref 6–23)
Calcium: 10.6 mg/dL — ABNORMAL HIGH (ref 8.4–10.5)
Creatinine, Ser: 0.89 mg/dL (ref 0.50–1.10)
GFR calc non Af Amer: 58 mL/min — ABNORMAL LOW (ref >90)
Glucose, Bld: 115 mg/dL — ABNORMAL HIGH (ref 70–99)

## 2013-01-21 NOTE — Progress Notes (Signed)
Last office visit 01/01/13 Dr Jonette Eva on chart  01/01/13 EKG on chart  ECHO 08/27/12 on chart  PFT 09/22/12 on chart

## 2013-01-26 ENCOUNTER — Encounter (INDEPENDENT_AMBULATORY_CARE_PROVIDER_SITE_OTHER): Payer: Self-pay

## 2013-01-29 ENCOUNTER — Observation Stay (HOSPITAL_COMMUNITY)
Admission: RE | Admit: 2013-01-29 | Discharge: 2013-01-30 | Disposition: A | Discharge status: Home / Self Care | Payer: Medicare Other | Source: Ambulatory Visit | Attending: Surgery | Admitting: Surgery

## 2013-01-29 ENCOUNTER — Ambulatory Visit (HOSPITAL_COMMUNITY): Payer: Medicare Other | Admitting: Certified Registered"

## 2013-01-29 ENCOUNTER — Encounter (HOSPITAL_COMMUNITY): Payer: Self-pay | Admitting: *Deleted

## 2013-01-29 ENCOUNTER — Encounter (HOSPITAL_COMMUNITY)
Admission: RE | Disposition: A | Discharge status: Home / Self Care | Payer: Self-pay | Source: Ambulatory Visit | Attending: Surgery

## 2013-01-29 ENCOUNTER — Encounter (HOSPITAL_COMMUNITY): Payer: Self-pay | Admitting: Certified Registered"

## 2013-01-29 DIAGNOSIS — I2510 Atherosclerotic heart disease of native coronary artery without angina pectoris: Secondary | ICD-10-CM | POA: Insufficient documentation

## 2013-01-29 DIAGNOSIS — E785 Hyperlipidemia, unspecified: Secondary | ICD-10-CM | POA: Diagnosis not present

## 2013-01-29 DIAGNOSIS — K432 Incisional hernia without obstruction or gangrene: Secondary | ICD-10-CM | POA: Diagnosis not present

## 2013-01-29 DIAGNOSIS — I252 Old myocardial infarction: Secondary | ICD-10-CM | POA: Diagnosis not present

## 2013-01-29 DIAGNOSIS — I10 Essential (primary) hypertension: Secondary | ICD-10-CM | POA: Insufficient documentation

## 2013-01-29 DIAGNOSIS — K5730 Diverticulosis of large intestine without perforation or abscess without bleeding: Secondary | ICD-10-CM | POA: Diagnosis not present

## 2013-01-29 DIAGNOSIS — M810 Age-related osteoporosis without current pathological fracture: Secondary | ICD-10-CM | POA: Insufficient documentation

## 2013-01-29 DIAGNOSIS — E669 Obesity, unspecified: Secondary | ICD-10-CM | POA: Diagnosis not present

## 2013-01-29 DIAGNOSIS — Z79899 Other long term (current) drug therapy: Secondary | ICD-10-CM | POA: Insufficient documentation

## 2013-01-29 DIAGNOSIS — J45909 Unspecified asthma, uncomplicated: Secondary | ICD-10-CM | POA: Insufficient documentation

## 2013-01-29 HISTORY — PX: INSERTION OF MESH: SHX5868

## 2013-01-29 HISTORY — PX: INCISIONAL HERNIA REPAIR: SHX193

## 2013-01-29 SURGERY — REPAIR, HERNIA, INCISIONAL, LAPAROSCOPIC
Anesthesia: General | Site: Abdomen | Wound class: Clean

## 2013-01-29 MED ORDER — POTASSIUM CHLORIDE IN NACL 20-0.9 MEQ/L-% IV SOLN
INTRAVENOUS | Status: AC
  Filled 2013-01-29: qty 1000

## 2013-01-29 MED ORDER — BUPIVACAINE HCL (PF) 0.5 % IJ SOLN
INTRAMUSCULAR | Status: DC | PRN
  Administered 2013-01-29: 20 mL

## 2013-01-29 MED ORDER — PROMETHAZINE HCL 25 MG/ML IJ SOLN: 6.2500 mg | INTRAMUSCULAR | Status: DC | PRN

## 2013-01-29 MED ORDER — BUPIVACAINE HCL (PF) 0.5 % IJ SOLN
INTRAMUSCULAR | Status: AC
  Filled 2013-01-29: qty 30

## 2013-01-29 MED ORDER — BUPIVACAINE-EPINEPHRINE (PF) 0.5% -1:200000 IJ SOLN
INTRAMUSCULAR | Status: AC
  Filled 2013-01-29: qty 10

## 2013-01-29 MED ORDER — ALBUTEROL SULFATE HFA 108 (90 BASE) MCG/ACT IN AERS
2.0000 | INHALATION_SPRAY | Freq: Four times a day (QID) | RESPIRATORY_TRACT | Status: DC | PRN
  Filled 2013-01-29: qty 6.7

## 2013-01-29 MED ORDER — FENTANYL CITRATE 0.05 MG/ML IJ SOLN
INTRAMUSCULAR | Status: AC
  Filled 2013-01-29: qty 2

## 2013-01-29 MED ORDER — GLYCOPYRROLATE 0.2 MG/ML IJ SOLN
INTRAMUSCULAR | Status: DC | PRN
  Administered 2013-01-29: 0.4 mg via INTRAVENOUS

## 2013-01-29 MED ORDER — SUCCINYLCHOLINE CHLORIDE 20 MG/ML IJ SOLN
INTRAMUSCULAR | Status: DC | PRN
  Administered 2013-01-29: 100 mg via INTRAVENOUS

## 2013-01-29 MED ORDER — CEFAZOLIN SODIUM-DEXTROSE 2-3 GM-% IV SOLR
2.0000 g | INTRAVENOUS | Status: AC
  Administered 2013-01-29: 2 g via INTRAVENOUS

## 2013-01-29 MED ORDER — ONDANSETRON HCL 4 MG/2ML IJ SOLN
INTRAMUSCULAR | Status: DC | PRN
  Administered 2013-01-29: 4 mg via INTRAVENOUS

## 2013-01-29 MED ORDER — ONDANSETRON HCL 4 MG/2ML IJ SOLN: 4.0000 mg | Freq: Four times a day (QID) | INTRAMUSCULAR | Status: DC | PRN

## 2013-01-29 MED ORDER — ONDANSETRON HCL 4 MG PO TABS: 4.0000 mg | ORAL_TABLET | Freq: Four times a day (QID) | ORAL | Status: DC | PRN

## 2013-01-29 MED ORDER — HYDROCODONE-ACETAMINOPHEN 5-325 MG PO TABS
1.0000 | ORAL_TABLET | ORAL | Status: DC | PRN
  Administered 2013-01-29 – 2013-01-30 (×2): 2 via ORAL
  Filled 2013-01-29: qty 2

## 2013-01-29 MED ORDER — MEPERIDINE HCL 50 MG/ML IJ SOLN: 6.2500 mg | INTRAMUSCULAR | Status: DC | PRN

## 2013-01-29 MED ORDER — LIDOCAINE HCL (PF) 2 % IJ SOLN
INTRAMUSCULAR | Status: DC | PRN
  Administered 2013-01-29: 20 mg

## 2013-01-29 MED ORDER — CEFAZOLIN SODIUM-DEXTROSE 2-3 GM-% IV SOLR
INTRAVENOUS | Status: AC
  Filled 2013-01-29: qty 50

## 2013-01-29 MED ORDER — PROPOFOL 10 MG/ML IV BOLUS
INTRAVENOUS | Status: DC | PRN
  Administered 2013-01-29: 100 mg via INTRAVENOUS

## 2013-01-29 MED ORDER — LACTATED RINGERS IV SOLN
INTRAVENOUS | Status: DC
  Administered 2013-01-29: 1000 mL via INTRAVENOUS

## 2013-01-29 MED ORDER — ENOXAPARIN SODIUM 30 MG/0.3ML ~~LOC~~ SOLN
30.0000 mg | SUBCUTANEOUS | Status: DC
  Administered 2013-01-30: 30 mg via SUBCUTANEOUS
  Filled 2013-01-29 (×2): qty 0.3

## 2013-01-29 MED ORDER — FENTANYL CITRATE 0.05 MG/ML IJ SOLN
25.0000 ug | INTRAMUSCULAR | Status: DC | PRN
  Administered 2013-01-29 (×3): 50 ug via INTRAVENOUS

## 2013-01-29 MED ORDER — MORPHINE SULFATE 4 MG/ML IJ SOLN
4.0000 mg | INTRAMUSCULAR | Status: DC | PRN
  Administered 2013-01-29 (×4): 4 mg via INTRAVENOUS
  Filled 2013-01-29 (×4): qty 1

## 2013-01-29 MED ORDER — LACTATED RINGERS IV SOLN
INTRAVENOUS | Status: DC | PRN
  Administered 2013-01-29: 09:00:00 via INTRAVENOUS

## 2013-01-29 MED ORDER — FENTANYL CITRATE 0.05 MG/ML IJ SOLN
INTRAMUSCULAR | Status: DC | PRN
  Administered 2013-01-29: 50 ug via INTRAVENOUS

## 2013-01-29 MED ORDER — ROCURONIUM BROMIDE 100 MG/10ML IV SOLN
INTRAVENOUS | Status: DC | PRN
  Administered 2013-01-29: 20 mg via INTRAVENOUS

## 2013-01-29 MED ORDER — NEOSTIGMINE METHYLSULFATE 1 MG/ML IJ SOLN
INTRAMUSCULAR | Status: DC | PRN
  Administered 2013-01-29: 3 mg via INTRAVENOUS

## 2013-01-29 MED ORDER — MIDAZOLAM HCL 5 MG/5ML IJ SOLN
INTRAMUSCULAR | Status: DC | PRN
  Administered 2013-01-29 (×2): 1 mg via INTRAVENOUS

## 2013-01-29 MED ORDER — IBUPROFEN 600 MG PO TABS
600.0000 mg | ORAL_TABLET | Freq: Four times a day (QID) | ORAL | Status: DC | PRN
  Filled 2013-01-29: qty 1

## 2013-01-29 MED ORDER — TRIAMTERENE-HCTZ 37.5-25 MG PO CAPS
1.0000 | ORAL_CAPSULE | ORAL | Status: DC
  Administered 2013-01-30: 1 via ORAL
  Filled 2013-01-29 (×2): qty 1

## 2013-01-29 MED ORDER — POTASSIUM CHLORIDE IN NACL 20-0.9 MEQ/L-% IV SOLN
INTRAVENOUS | Status: DC
  Administered 2013-01-29: 11:00:00 via INTRAVENOUS
  Filled 2013-01-29 (×2): qty 1000

## 2013-01-29 MED ORDER — METOPROLOL TARTRATE 25 MG PO TABS
25.0000 mg | ORAL_TABLET | Freq: Two times a day (BID) | ORAL | Status: DC
  Administered 2013-01-29: 25 mg via ORAL
  Filled 2013-01-29 (×3): qty 1

## 2013-01-29 SURGICAL SUPPLY — 48 items
APL SKNCLS STERI-STRIP NONHPOA (GAUZE/BANDAGES/DRESSINGS) ×1
BANDAGE ADHESIVE 1X3 (GAUZE/BANDAGES/DRESSINGS) IMPLANT
BENZOIN TINCTURE PRP APPL 2/3 (GAUZE/BANDAGES/DRESSINGS) ×2 IMPLANT
BINDER ABD UNIV 12 45-62 (WOUND CARE) IMPLANT
BINDER ABDOMINAL 46IN 62IN (WOUND CARE) ×2
CANISTER SUCTION 2500CC (MISCELLANEOUS) ×2 IMPLANT
CANNULA ENDOPATH XCEL 11M (ENDOMECHANICALS) ×1 IMPLANT
CHLORAPREP W/TINT 26ML (MISCELLANEOUS) ×2 IMPLANT
CLOTH BEACON ORANGE TIMEOUT ST (SAFETY) ×2 IMPLANT
DECANTER SPIKE VIAL GLASS SM (MISCELLANEOUS) ×2 IMPLANT
DEVICE SECURE STRAP 25 ABSORB (INSTRUMENTS) ×3 IMPLANT
DEVICE TROCAR PUNCTURE CLOSURE (ENDOMECHANICALS) ×2 IMPLANT
DRAPE LAPAROSCOPIC ABDOMINAL (DRAPES) ×2 IMPLANT
DRSG TEGADERM 2-3/8X2-3/4 SM (GAUZE/BANDAGES/DRESSINGS) IMPLANT
ELECT REM PT RETURN 9FT ADLT (ELECTROSURGICAL) ×2
ELECTRODE REM PT RTRN 9FT ADLT (ELECTROSURGICAL) ×1 IMPLANT
GLOVE BIO SURGEON STRL SZ 6.5 (GLOVE) ×1 IMPLANT
GLOVE BIOGEL PI IND STRL 7.0 (GLOVE) ×1 IMPLANT
GLOVE BIOGEL PI INDICATOR 7.0 (GLOVE) ×2
GLOVE SURG SIGNA 7.5 PF LTX (GLOVE) ×4 IMPLANT
GLOVE SURG SS PI 7.0 STRL IVOR (GLOVE) ×1 IMPLANT
GOWN STRL NON-REIN LRG LVL3 (GOWN DISPOSABLE) ×2 IMPLANT
GOWN STRL REIN XL XLG (GOWN DISPOSABLE) ×4 IMPLANT
KIT BASIN OR (CUSTOM PROCEDURE TRAY) ×2 IMPLANT
MARKER SKIN DUAL TIP RULER LAB (MISCELLANEOUS) ×2 IMPLANT
MESH VENTRALIGHT ST 6X8 (Mesh Specialty) ×2 IMPLANT
MESH VENTRLGHT ELLIPSE 8X6XMFL (Mesh Specialty) IMPLANT
NDL INSUFFLATION 14GA 120MM (NEEDLE) IMPLANT
NDL SPNL 22GX3.5 QUINCKE BK (NEEDLE) ×1 IMPLANT
NEEDLE INSUFFLATION 14GA 120MM (NEEDLE) IMPLANT
NEEDLE SPNL 22GX3.5 QUINCKE BK (NEEDLE) ×2 IMPLANT
NS IRRIG 1000ML POUR BTL (IV SOLUTION) ×2 IMPLANT
PENCIL BUTTON HOLSTER BLD 10FT (ELECTRODE) IMPLANT
SCALPEL HARMONIC ACE (MISCELLANEOUS) IMPLANT
SCISSORS LAP 5X35 DISP (ENDOMECHANICALS) ×1 IMPLANT
SET IRRIG TUBING LAPAROSCOPIC (IRRIGATION / IRRIGATOR) IMPLANT
SOLUTION ANTI FOG 6CC (MISCELLANEOUS) ×2 IMPLANT
STRIP CLOSURE SKIN 1/2X4 (GAUZE/BANDAGES/DRESSINGS) ×3 IMPLANT
SUT MNCRL AB 4-0 PS2 18 (SUTURE) ×1 IMPLANT
SUT NOVA NAB DX-16 0-1 5-0 T12 (SUTURE) ×4 IMPLANT
TACKER 5MM HERNIA 3.5CML NAB (ENDOMECHANICALS) IMPLANT
TOWEL OR 17X26 10 PK STRL BLUE (TOWEL DISPOSABLE) ×2 IMPLANT
TRAY FOLEY CATH 14FRSI W/METER (CATHETERS) ×2 IMPLANT
TRAY LAP CHOLE (CUSTOM PROCEDURE TRAY) ×2 IMPLANT
TROCAR BLADELESS OPT 5 75 (ENDOMECHANICALS) ×3 IMPLANT
TROCAR XCEL BLUNT TIP 100MML (ENDOMECHANICALS) IMPLANT
TROCAR XCEL NON-BLD 11X100MML (ENDOMECHANICALS) ×2 IMPLANT
TUBING INSUFFLATION 10FT LAP (TUBING) ×2 IMPLANT

## 2013-01-29 NOTE — Anesthesia Preprocedure Evaluation (Signed)
Anesthesia Evaluation  Patient identified by MRN, date of birth, ID band Patient awake    Reviewed: Allergy & Precautions, H&P , NPO status , Patient's Chart, lab work & pertinent test results  History of Anesthesia Complications Negative for: history of anesthetic complications  Airway Mallampati: II TM Distance: >3 FB Neck ROM: Full    Dental no notable dental hx. (+) Dental Advisory Given   Pulmonary neg pulmonary ROS, asthma ,  breath sounds clear to auscultation  Pulmonary exam normal       Cardiovascular hypertension, + CAD, + Past MI and + Cardiac Stents negative cardio ROS  Rhythm:Regular Rate:Normal     Neuro/Psych negative neurological ROS  negative psych ROS   GI/Hepatic negative GI ROS, Neg liver ROS,   Endo/Other  negative endocrine ROS  Renal/GU negative Renal ROS  negative genitourinary   Musculoskeletal negative musculoskeletal ROS (+)   Abdominal   Peds negative pediatric ROS (+)  Hematology negative hematology ROS (+)   Anesthesia Other Findings Upper lower front caps   Reproductive/Obstetrics negative OB ROS                           Anesthesia Physical  Anesthesia Plan  ASA: III  Anesthesia Plan: General   Post-op Pain Management:    Induction: Intravenous  Airway Management Planned: Oral ETT  Additional Equipment:   Intra-op Plan:   Post-operative Plan: Extubation in OR  Informed Consent: I have reviewed the patients History and Physical, chart, labs and discussed the procedure including the risks, benefits and alternatives for the proposed anesthesia with the patient or authorized representative who has indicated his/her understanding and acceptance.   Dental advisory given  Plan Discussed with: CRNA  Anesthesia Plan Comments:         Anesthesia Quick Evaluation

## 2013-01-29 NOTE — Op Note (Signed)
[name], Mary NO.:  [id number]  MEDICAL RECORD NO.:  [id number]  LOCATION:  1534                         FACILITY:  [facility]  PHYSICIAN:  Abigail Miyamoto, M.D. DATE OF BIRTH:  [birth year]  DATE OF PROCEDURE: DATE OF DISCHARGE:                              OPERATIVE REPORT   PREOPERATIVE DIAGNOSIS:  Ventral incisional hernia.  POSTOPERATIVE DIAGNOSIS:  Ventral incisional hernia.  PROCEDURE:  Laparoscopic incisional hernia repair with mesh (15 cm x 20 cm Bard ventral light patch).  SURGEON:  Abigail Miyamoto, M.D.  ANESTHESIA:  General endotracheal anesthesia and 0.5% Marcaine.  ESTIMATED BLOOD LOSS:  Minimal.  FINDINGS:  The patient was found have a moderate-sized incisional hernia, containing only omentum at the upper part of the paramedian incision.  PROCEDURE IN DETAIL:  The patient was brought to the operating room and identified as [name].  She was placed supine on the operative table.  General anesthesia was induced.  Her abdomen was then prepped and draped in the usual sterile fashion.  I made a small incision in the left upper quadrant with a scalpel.  I then used the 5-mm trocar and Optiview camera to slowly traverse all levels of the abdominal wall under direct vision.  In gained entrance to the peritoneal cavity. Insufflation was then begun.  I evaluated the trocar introduction site and saw no evidence of bowel injury.  The patient had a moderate amount of adhesions to a large hernia defect in the upper midline.  I placed a 5-mm port in the patient's left lower quadrant, and a 11 mm port in the patient's left mid quadrant.  I then lysed the adhesions with laparoscopic scissors.  In the right mid quadrant, there was 1 area of small bowel stuck to the abdominal wall with dense adhesions.  I took this down evaluated the bowel and saw no evidence of bowel injury.  At this point, the hernia defect was cleaned of all the omentum.   I measured the defect and I brought a 15 cm x 20 cm Ventralite patch from Bard onto the field.  I placed four 3-0 Novafil sutures into the mesh and then rolled it up and placed 11 mm trocar.  I then unrolled it under direct vision on abdominal cavity.  I then made 4 separate skin incisions and using the suture passer pulled the sutures up to the abdominal wall in the 4 different quadrants.  These were then tied and placed pulling the mesh up to the abdominal wall.  4 cm of overlap appeared to be achieved circumferentially around the fascial defect.  I then tacked the mesh to the peritoneum circumferentially with the secure strap absorbable tacker.  Again, good coverage of the fascial defect appeared to be achieved.  I again multiple times examined the small bowel that had been taken down from the abdominal wall.  I again saw no evidence of bowel injury.  At this point, hemostasis also appeared to be achieved.  The abdomen was then deflated under direct vision, and all trocars were removed.  I then anesthetized all incisions with Marcaine, closed all incisions with 4-0 Monocryl subcuticular sutures.  Steri- Strips and Band-Aids  were then applied.  The patient tolerated the procedure well.  All counts were correct at the end of procedure.  The patient was then extubated in the operating room and taken in stable condition to recovery room.     Abigail Miyamoto, M.D.     DB/MEDQ  D:  01/29/2013  T:  01/29/2013  Job:  [id number]

## 2013-01-29 NOTE — Preoperative (Signed)
Beta Blockers   Reason not to administer Beta Blockers:Not Applicable, Took this AM

## 2013-01-29 NOTE — Anesthesia Postprocedure Evaluation (Signed)
  Anesthesia Post-op Note  Patient: [name]  Procedure(s) Performed: Procedure(s) (LRB): LAPAROSCOPIC INCISIONAL HERNIA (N/A) INSERTION OF MESH (N/A)  Patient Location: PACU  Anesthesia Type: General  Level of Consciousness: awake and alert   Airway and Oxygen Therapy: Patient Spontanous Breathing  Post-op Pain: mild  Post-op Assessment: Post-op Vital signs reviewed, Patient's Cardiovascular Status Stable, Respiratory Function Stable, Patent Airway and No signs of Nausea or vomiting  Last Vitals:  Filed Vitals:   01/29/13 1300  BP: 144/54  Pulse: 79  Temp: 36.7 C  Resp: 16    Post-op Vital Signs: stable   Complications: No apparent anesthesia complications

## 2013-01-29 NOTE — H&P (Signed)
CC: incisional hernia  Chief Complaint   Patient presents with   .  Other     Old incision hard and puffy    HPI: [name] is a 79 y.o. female who presents for evaluation of incisional problem. She noted a bulge in the upper part of her incision about three months ago. No other sx. Not getting bigger. Goes away when she lies down  She is a patient of Dr. Tenna Child and he has asked me to resume care  PMH: has a past medical history of Arthritis; Asthma; Adenomatous colon polyp (MAY 2013); Diverticulosis; HLD (hyperlipidemia); Obesity; Internal hemorrhoids; Osteoporosis; Anemia; Blood transfusion (MAY 2013); Heart attack (01/01/2001); Hypertension; Complication of anesthesia (2009); CAD (coronary artery disease); Cancer; Multiple lung nodules (08/2012); and DJD (degenerative joint disease).   PSH: has past surgical history that includes Appendectomy; Cholecystectomy; Coronary angioplasty with stent; Colonoscopy w/ biopsies and polypectomy (06/15/2008); Total hip arthroplasty (09/08/2007); Colonoscopy (11/16/2011); knee replacement (2003); Joint replacement (2003); Esophagogastroduodenoscopy (N/A, 10/23/2012); Colonoscopy; and Polypectomy.  ALLERGIES:  Allergies   Allergen  Reactions   .  Atorvastatin  Shortness Of Breath   .  Brilinta (Ticagrelor)  Shortness Of Breath   .  Clopidogrel Bisulfate  Shortness Of Breath   .  Codeine  Nausea And Vomiting   .  Sulfonamide Derivatives  Other (See Comments)     Intestinal upset   MEDICATIONS: Current outpatient prescriptions:acetaminophen (TYLENOL) 325 MG tablet, Take 650 mg by mouth every 6 (six) hours as needed. For pain, Disp: , Rfl: ; albuterol (PROVENTIL HFA;VENTOLIN HFA) 108 (90 BASE) MCG/ACT inhaler, Inhale 2 puffs into the lungs every 4 (four) hours as needed. For shortness of breath, Disp: , Rfl: ; BIOTIN PO, Take 1 tablet by mouth daily., Disp: , Rfl:  Calcium Carbonate-Vitamin D (CALCIUM + D PO), Take 1 tablet by mouth daily. Calcium 500mg   with Vitamin D, Disp: , Rfl: ; cetirizine (ZYRTEC) 10 MG tablet, Take 5 mg by mouth 2 (two) times daily. , Disp: , Rfl: ; cholecalciferol (VITAMIN D) 1000 UNITS tablet, Take 1,000 Units by mouth daily., Disp: , Rfl: ; clopidogrel (PLAVIX) 75 MG tablet, Take 1 tablet (75 mg total) by mouth daily. Stop until Monday 10/27/12, Disp: , Rfl:  fluticasone (FLONASE) 50 MCG/ACT nasal spray, Place 1 spray into the nose daily as needed. , Disp: , Rfl: ; lovastatin (ALTOPREV) 20 MG 24 hr tablet, Take 20 mg by mouth at bedtime., Disp: , Rfl: ; metoprolol (LOPRESSOR) 50 MG tablet, Take 25 mg by mouth 2 (two) times daily. , Disp: , Rfl: ; pantoprazole (PROTONIX) 40 MG tablet, Take 1 tablet (40 mg total) by mouth 2 (two) times daily., Disp: 60 tablet, Rfl: 1  traMADol (ULTRAM) 50 MG tablet, Take 50 mg by mouth every 6 (six) hours as needed. , Disp: , Rfl: ; triamterene-hydrochlorothiazide (DYAZIDE) 37.5-25 MG per capsule, Take 1 capsule by mouth every morning., Disp: , Rfl:  ROS: She  has filled out our 12 point review of systems and it is negative .   EXAM:  BP 110/72  Pulse 72  Temp(Src) 97.9 F (36.6 C) (Oral)  Resp 12  Ht 5' (1.524 m)  Wt 155 lb (70.308 kg)  BMI 30.27 kg/m2: .  General; Alert, oriented, NAD  Abd soft. Well healed right paramedian incision with bulge in upper portion that reduces. I can't feel the fascial defect  Extremities: Fragile skin with multiple bruises  Lungs: clear CV: RRR Skin:  No erythema or  rash Neck: supple  DATA REVIEWED: Old Epic notes   IMPRESSION:  Incisional hernia    PLAN:  Will proceed with laparoscopic incisional hernia repair with mesh.  I discussed the surgical procedure with her in detail.  I discussed the risks which include but are not limited to bleeding, infection, injury to surrounding structures, need to convert to an open procedure, cardiopulmonary problems, etc.  She agrees to proceed.

## 2013-01-29 NOTE — Transfer of Care (Signed)
Immediate Anesthesia Transfer of Care Note  Patient: [name]  Procedure(s) Performed: Procedure(s) (LRB): LAPAROSCOPIC INCISIONAL HERNIA (N/A) INSERTION OF MESH (N/A)  Patient Location: PACU  Anesthesia Type: General  Level of Consciousness: sedated, patient cooperative and responds to stimulaton  Airway & Oxygen Therapy: Patient Spontanous Breathing and Patient connected to face mask oxgen  Post-op Assessment: Report given to PACU RN and Post -op Vital signs reviewed and stable  Post vital signs: Reviewed and stable  Complications: No apparent anesthesia complications

## 2013-01-29 NOTE — Op Note (Signed)
LAPAROSCOPIC INCISIONAL HERNIA, INSERTION OF MESH  Procedure Note  [name] 01/29/2013   Pre-op Diagnosis: incisional hernia      Post-op Diagnosis: same  Procedure(s): LAPAROSCOPIC INCISIONAL HERNIA INSERTION OF MESH (15cm x 20 cm Bard Ventrlite)  Surgeon(s): Shelly Rubenstein, MD  Anesthesia: General  Staff:  Circulator: Alphonzo Lemmings, RN Relief Circulator: Loann Quill Dertouzos, RN Scrub Person: Beryl Meager, CST; Cristie Hem, RN  Estimated Blood Loss: Minimal                         [name]   Date: 01/29/2013  Time: 10:43 AM

## 2013-01-30 ENCOUNTER — Encounter (HOSPITAL_COMMUNITY): Payer: Self-pay | Admitting: Surgery

## 2013-01-30 MED ORDER — HYDROCODONE-ACETAMINOPHEN 5-325 MG PO TABS: 1.0000 | ORAL_TABLET | ORAL | Status: DC | PRN

## 2013-01-30 NOTE — Discharge Summary (Signed)
Physician Discharge Summary  Patient ID: [name] MRN: [id number] DOB/AGE: 77/01/1928 77 y.o.  Admit date: 01/29/2013 Discharge date: 01/30/2013  Admission Diagnoses:  Discharge Diagnoses:  Active Problems:   * No active hospital problems. * INCISIONAL HERNIA  Discharged Condition: good  Hospital Course: UNEVENTFUL POST OP RECOVERY.  DISCHARGED HOME POD#1  Consults: None  Significant Diagnostic Studies:   Treatments: surgery: LAPAROSCOPIC INCISIONAL HERNIA REPAIR WITH MESH  Discharge Exam: Blood pressure 129/79, pulse 92, temperature 97.9 F (36.6 C), temperature source Oral, resp. rate 18, height 5' (1.524 m), weight 177 lb 7.5 oz (80.5 kg), SpO2 90.00%. General appearance: alert and no distress Resp: clear to auscultation bilaterally Cardio: regular rate and rhythm, S1, S2 normal, no murmur, click, rub or gallop Incision/Wound: incisions clean, abdomen soft  Disposition: 01-Home or Self Care   Future Appointments Provider Department Dept Phone   02/13/2013 11:20 AM Shelly Rubenstein, MD [facility], [place] [phone]       Medication List         acetaminophen 325 MG tablet  Commonly known as:  TYLENOL  Take 650 mg by mouth every 6 (six) hours as needed. For pain     albuterol 108 (90 BASE) MCG/ACT inhaler  Commonly known as:  PROVENTIL HFA;VENTOLIN HFA  Inhale 2 puffs into the lungs every 6 (six) hours as needed for wheezing.     aspirin 162 MG EC tablet  Take 162 mg by mouth daily.     Biotin 5000 MCG Caps  Take 5,000 mcg by mouth daily.     calcium citrate-vitamin D 315-200 MG-UNIT per tablet  Commonly known as:  CITRACAL+D  Take 1 tablet by mouth daily.     cetirizine 10 MG tablet  Commonly known as:  ZYRTEC  Take 5 mg by mouth 2 (two) times daily.     ferrous sulfate 325 (65 FE) MG tablet  Take 325 mg by mouth daily with breakfast.     HYDROcodone-acetaminophen 5-325 MG per tablet  Commonly known as:  NORCO/VICODIN  Take 1-2  tablets by mouth every 4 (four) hours as needed.     lovastatin 20 MG 24 hr tablet  Commonly known as:  ALTOPREV  Take 20 mg by mouth at bedtime.     metoprolol 50 MG tablet  Commonly known as:  LOPRESSOR  Take 25 mg by mouth 2 (two) times daily.     traMADol 50 MG tablet  Commonly known as:  ULTRAM  Take 50 mg by mouth every 6 (six) hours as needed for pain.     triamterene-hydrochlorothiazide 37.5-25 MG per capsule  Commonly known as:  DYAZIDE  Take 1 capsule by mouth every morning.           Follow-up Information   Follow up with Shelly Rubenstein, MD On 02/13/2013.   Contact information:   [address] [place] [address] [phone]       Signed: Shelly Rubenstein 01/30/2013, 7:25 AM

## 2013-01-30 NOTE — Care Management Note (Signed)
    Page 1 of 1   01/30/2013     12:26:15 PM   CARE MANAGEMENT NOTE 01/30/2013  Patient:  [name], [name]   Account Number:  [id number]  Date Initiated:  01/30/2013  Documentation initiated by:  Lorenda Ishihara  Subjective/Objective Assessment:   79 yo female admitted s/p ventral hernia repair. PTA lived at home with spouse.     Action/Plan:   Home when stable   Anticipated DC Date:  01/30/2013   Anticipated DC Plan:  HOME/SELF CARE      DC Planning Services  CM consult      Choice offered to / List presented to:             Status of service:  Completed, signed off Medicare Important Message given?   (If response is "NO", the following Medicare IM given date fields will be blank) Date Medicare IM given:   Date Additional Medicare IM given:    Discharge Disposition:  HOME/SELF CARE  Per UR Regulation:  Reviewed for med. necessity/level of care/duration of stay  If discussed at Long Length of Stay Meetings, dates discussed:    Comments:

## 2013-01-30 NOTE — Progress Notes (Signed)
1 Day Post-Op  Subjective: POD#1 She feels much better this morning.  Voiding well.  Pain better controlled  Objective: Vital signs in last 24 hours: Temp:  [97.9 F (36.6 C)-98.7 F (37.1 C)] 97.9 F (36.6 C) (08/01 0630) Pulse Rate:  [74-95] 92 (08/01 0630) Resp:  [11-18] 18 (08/01 0630) BP: (129-183)/(54-83) 129/79 mmHg (08/01 0630) SpO2:  [90 %-100 %] 90 % (08/01 0630) Weight:  [177 lb 7.5 oz (80.5 kg)] 177 lb 7.5 oz (80.5 kg) (07/31 1200) Last BM Date: 01/29/13 (PTA)  Intake/Output from previous day: 07/31 0701 - 08/01 0700 In: 2557.5 [P.O.:720; I.V.:1837.5] Out: -  Intake/Output this shift:   Lungs clear Abdomen soft, mild post op tenderness  Lab Results:  No results found for this basename: WBC, HGB, HCT, PLT,  in the last 72 hours BMET No results found for this basename: NA, K, CL, CO2, GLUCOSE, BUN, CREATININE, CALCIUM,  in the last 72 hours PT/INR No results found for this basename: LABPROT, INR,  in the last 72 hours ABG No results found for this basename: PHART, PCO2, PO2, HCO3,  in the last 72 hours  Studies/Results: No results found.  Anti-infectives: Anti-infectives   Start     Dose/Rate Route Frequency Ordered Stop   01/29/13 0742  ceFAZolin (ANCEF) IVPB 2 g/50 mL premix     2 g 100 mL/hr over 30 Minutes Intravenous On call to O.R. 01/29/13 0742 01/29/13 0935      Assessment/Plan: s/p Procedure(s): LAPAROSCOPIC INCISIONAL HERNIA (N/A) INSERTION OF MESH (N/A)  Ambulate this morning Home after lunch  LOS: 1 day    Edu On A 01/30/2013

## 2013-02-02 ENCOUNTER — Telehealth (INDEPENDENT_AMBULATORY_CARE_PROVIDER_SITE_OTHER): Payer: Self-pay

## 2013-02-02 NOTE — Telephone Encounter (Signed)
Patient states she is having nausea from the pain medication, she took the medication without eating.   Dr. Derrell Lolling advises for her to eat something to coat her stomach the nausea should fade away. Advise her to eat something 15 minutes before taking her medication .Patient verbalized understanding

## 2013-02-04 ENCOUNTER — Telehealth (INDEPENDENT_AMBULATORY_CARE_PROVIDER_SITE_OTHER): Payer: Self-pay | Admitting: *Deleted

## 2013-02-04 ENCOUNTER — Other Ambulatory Visit (INDEPENDENT_AMBULATORY_CARE_PROVIDER_SITE_OTHER): Payer: Self-pay | Admitting: *Deleted

## 2013-02-04 MED ORDER — ONDANSETRON 4 MG PO TBDP: 4.0000 mg | ORAL_TABLET | Freq: Four times a day (QID) | ORAL | Status: DC | PRN

## 2013-02-04 NOTE — Telephone Encounter (Signed)
Patient called in this morning to state that she has stopped taking the pain medication however she continues to have issues with nausea.  Patient reports she keeps burping as well and just feels like she could vomit at any time.  Explained to patient to try a bland diet and that per protocol this RN will send prescription for Zofran 4mg  ODT 1 tablet every 6 hours as needed for nausea #15 no refills into her pharmacy.  This prescription was escribed at this time.  Patient states understanding and agreeable at this time.

## 2013-02-12 ENCOUNTER — Other Ambulatory Visit: Payer: Self-pay | Admitting: Geriatric Medicine

## 2013-02-12 DIAGNOSIS — R911 Solitary pulmonary nodule: Secondary | ICD-10-CM

## 2013-02-13 ENCOUNTER — Ambulatory Visit (INDEPENDENT_AMBULATORY_CARE_PROVIDER_SITE_OTHER): Payer: Medicare Other | Admitting: Surgery

## 2013-02-13 ENCOUNTER — Encounter (INDEPENDENT_AMBULATORY_CARE_PROVIDER_SITE_OTHER): Payer: Self-pay | Admitting: Surgery

## 2013-02-13 VITALS — BP 130/68 | HR 84 | Temp 98.2°F | Resp 15 | Ht 60.0 in | Wt 150.6 lb

## 2013-02-13 DIAGNOSIS — Z09 Encounter for follow-up examination after completed treatment for conditions other than malignant neoplasm: Secondary | ICD-10-CM

## 2013-02-13 NOTE — Progress Notes (Signed)
Subjective:     Patient ID: [name], female   DOB: [birth year], 79 y.o.   MRN: [id number]  HPI She is here for her first postoperative visit status post laparoscopic incisional hernia repair with mesh. She reports moderate postoperative pain for the first 10 days but is now significantly improved. She is moving her bowels well  Review of Systems     Objective:   Physical Exam She looks well on exam. Her incisions are well healed and there is no evidence of recurrent hernia    Assessment:     Patient stable postop     Plan:     She will refrain from heavy lifting for 2 more weeks. She may then resume her normal activities. I will see her back as needed

## 2013-02-16 ENCOUNTER — Telehealth (INDEPENDENT_AMBULATORY_CARE_PROVIDER_SITE_OTHER): Payer: Self-pay

## 2013-02-16 DIAGNOSIS — R911 Solitary pulmonary nodule: Secondary | ICD-10-CM | POA: Diagnosis not present

## 2013-02-16 DIAGNOSIS — Z01812 Encounter for preprocedural laboratory examination: Secondary | ICD-10-CM | POA: Diagnosis not present

## 2013-02-16 NOTE — Telephone Encounter (Signed)
I called the pt to let her know about a CT chest that was scheduled for her.  She is confused because  Dr Pete Glatter has already scheduled it.  She is scheduled for tomorrow at [facility].  I told her Dr Jamey Ripa ordered it as well for September to follow up her previous scan.  She said to cancel that one.  I will let [facility] know.

## 2013-02-17 ENCOUNTER — Ambulatory Visit
Admission: RE | Admit: 2013-02-17 | Discharge: 2013-02-17 | Disposition: A | Discharge status: Home / Self Care | Payer: Medicare Other | Source: Ambulatory Visit | Attending: Geriatric Medicine | Admitting: Geriatric Medicine

## 2013-02-17 DIAGNOSIS — R911 Solitary pulmonary nodule: Secondary | ICD-10-CM

## 2013-02-17 DIAGNOSIS — J984 Other disorders of lung: Secondary | ICD-10-CM | POA: Diagnosis not present

## 2013-02-17 MED ORDER — IOHEXOL 300 MG/ML  SOLN
75.0000 mL | Freq: Once | INTRAMUSCULAR | Status: AC | PRN
  Administered 2013-02-17: 75 mL via INTRAVENOUS

## 2013-02-18 ENCOUNTER — Other Ambulatory Visit: Payer: Self-pay | Admitting: Geriatric Medicine

## 2013-02-18 DIAGNOSIS — C189 Malignant neoplasm of colon, unspecified: Secondary | ICD-10-CM

## 2013-02-19 ENCOUNTER — Ambulatory Visit
Admission: RE | Admit: 2013-02-19 | Discharge: 2013-02-19 | Disposition: A | Discharge status: Home / Self Care | Payer: Medicare Other | Source: Ambulatory Visit | Attending: Geriatric Medicine | Admitting: Geriatric Medicine

## 2013-02-19 DIAGNOSIS — C189 Malignant neoplasm of colon, unspecified: Secondary | ICD-10-CM

## 2013-02-19 DIAGNOSIS — K5730 Diverticulosis of large intestine without perforation or abscess without bleeding: Secondary | ICD-10-CM | POA: Diagnosis not present

## 2013-02-19 MED ORDER — IOHEXOL 300 MG/ML  SOLN
100.0000 mL | Freq: Once | INTRAMUSCULAR | Status: AC | PRN
  Administered 2013-02-19: 100 mL via INTRAVENOUS

## 2013-02-20 ENCOUNTER — Telehealth: Payer: Self-pay | Admitting: Internal Medicine

## 2013-02-20 NOTE — Telephone Encounter (Signed)
Patient is scheduled to see Dr. Magnus Ivan 02/24/13 9:10 and Dr. Leone Payor 03/16/13 8:45.

## 2013-02-20 NOTE — Telephone Encounter (Signed)
She has a fluid collection and abnormal sigmoid colon after hernia repair  I spoke to Dr. Pete Glatter and said she will need to see me with likely sigmoidoscopy/colonoscopy at some point.  I did not have full report in fronto of me then - I have forwarded CT results to Dr. Magnus Ivan, her surgeon  She should come see me in next few weeks but I suspect she will need to see Dr. Magnus Ivan first re: fluid collection  Please have her schedule appt with both of [place] - go ahead and call [facility] and get her appt

## 2013-02-23 ENCOUNTER — Encounter (INDEPENDENT_AMBULATORY_CARE_PROVIDER_SITE_OTHER): Payer: Medicare Other | Admitting: Surgery

## 2013-02-24 ENCOUNTER — Ambulatory Visit (INDEPENDENT_AMBULATORY_CARE_PROVIDER_SITE_OTHER): Payer: Medicare Other | Admitting: Surgery

## 2013-02-24 ENCOUNTER — Encounter (INDEPENDENT_AMBULATORY_CARE_PROVIDER_SITE_OTHER): Payer: Self-pay | Admitting: Surgery

## 2013-02-24 VITALS — BP 138/70 | HR 77 | Temp 96.6°F | Resp 16 | Ht 60.0 in | Wt 150.2 lb

## 2013-02-24 DIAGNOSIS — Z09 Encounter for follow-up examination after completed treatment for conditions other than malignant neoplasm: Secondary | ICD-10-CM

## 2013-02-24 NOTE — Progress Notes (Signed)
Subjective:     Patient ID: [name], female   DOB: [birth year], 79 y.o.   MRN: [id number]  HPI She is now almost 4 weeks status post laparoscopic incisional hernia repair with mesh. Today she is almost pain free. Since I saw her last, she had a CAT scan of the abdomen and pelvis as part of her cancer followup. I actually warned her before the scan that a seroma would be seen around her mesh As she was almost immediately postop. The scan actually showed a possible abnormality in the sigmoid colon as well. Again she is asymptomatic from the seroma and has no fevers  Review of Systems     Objective:   Physical Exam On exam, the seroma is present and palpable there is no erythema or tenderness.  I reviewed the CAT scan as well showing the abnormality in the sigmoid colon.    Assessment:     Patient stable postop     Plan:         The seroma will resolve over time. No intervention is needed. She actually saw her gastroenterologist in May and had a colonoscopy which is unremarkable. She does have a moderate amount of diverticulosis in the sigmoid colon. She will be seeing Dr. Leone Payor in September. I doubt there is a mass in the sigmoid colon based on her recent colonoscopy. I will see her here as needed

## 2013-03-03 ENCOUNTER — Encounter: Payer: Self-pay | Admitting: Interventional Cardiology

## 2013-03-03 DIAGNOSIS — D649 Anemia, unspecified: Secondary | ICD-10-CM | POA: Diagnosis not present

## 2013-03-11 ENCOUNTER — Telehealth: Payer: Self-pay | Admitting: Internal Medicine

## 2013-03-11 NOTE — Telephone Encounter (Signed)
Patient notified via voicemail that she should keep the appt for Monday with Dr. Leone Payor.  She is asked to call for any questions

## 2013-03-13 DIAGNOSIS — Z23 Encounter for immunization: Secondary | ICD-10-CM | POA: Diagnosis not present

## 2013-03-16 ENCOUNTER — Ambulatory Visit (INDEPENDENT_AMBULATORY_CARE_PROVIDER_SITE_OTHER): Payer: Medicare Other | Admitting: Internal Medicine

## 2013-03-16 ENCOUNTER — Encounter: Payer: Self-pay | Admitting: Internal Medicine

## 2013-03-16 VITALS — BP 100/70 | HR 76 | Ht 60.0 in | Wt 154.1 lb

## 2013-03-16 DIAGNOSIS — R933 Abnormal findings on diagnostic imaging of other parts of digestive tract: Secondary | ICD-10-CM | POA: Diagnosis not present

## 2013-03-16 DIAGNOSIS — K5730 Diverticulosis of large intestine without perforation or abscess without bleeding: Secondary | ICD-10-CM | POA: Insufficient documentation

## 2013-03-16 NOTE — Progress Notes (Signed)
Subjective:    Patient ID: [name], female    DOB: 12-22-1927, 79 y.o.   MRN: [id number]  HPI The patient is here after having a CT abdomen and pelvis due to pain after hernia surgery. It showed a fluid collection in the abdominal wall that Dr. Magnus Ivan thought was not unusual. It also showed a thickened sigmoid colon and the radiologist suggested it could represent a tumor. She had a full colonoscopy in May of this year for follow-up of colon cancer resected in 2013 (right hemicolectomy) and had severe sigmoid diverticulosis. Other than fatigue she feels ok now. Avoiding nuts helps with diverticulosis she says. Allergies  Allergen Reactions  . Atorvastatin Shortness Of Breath  . Brilinta [Ticagrelor] Shortness Of Breath  . Clopidogrel Bisulfate Shortness Of Breath  . Codeine Nausea And Vomiting  . Sulfonamide Derivatives Other (See Comments)    Intestinal upset   Outpatient Prescriptions Prior to Visit  Medication Sig Dispense Refill  . acetaminophen (TYLENOL) 325 MG tablet Take 650 mg by mouth every 6 (six) hours as needed. For pain      . albuterol (PROVENTIL HFA;VENTOLIN HFA) 108 (90 BASE) MCG/ACT inhaler Inhale 2 puffs into the lungs every 6 (six) hours as needed for wheezing.      [name] Kitchen aspirin 162 MG EC tablet Take 162 mg by mouth daily.      . Biotin 5000 MCG CAPS Take 5,000 mcg by mouth daily.      . calcium citrate-vitamin D (CITRACAL+D) 315-200 MG-UNIT per tablet Take 1 tablet by mouth daily.      . cetirizine (ZYRTEC) 10 MG tablet Take 5 mg by mouth 2 (two) times daily.       . ferrous sulfate 325 (65 FE) MG tablet Take 325 mg by mouth daily with breakfast. Down to 3 iron tablets per week      . lovastatin (ALTOPREV) 20 MG 24 hr tablet Take 20 mg by mouth at bedtime.      . metoprolol (LOPRESSOR) 50 MG tablet Take 25 mg by mouth 2 (two) times daily.       . traMADol (ULTRAM) 50 MG tablet Take 50 mg by mouth every 6 (six) hours as needed for pain.       [name] Kitchen  triamterene-hydrochlorothiazide (DYAZIDE) 37.5-25 MG per capsule Take 1 capsule by mouth every morning.       No facility-administered medications prior to visit.   Past Medical History  Diagnosis Date  . Arthritis   . Asthma   . Adenomatous colon polyp MAY 2013    HOSPITALIZED AT [facility] WITH HGB 7 -TRANSFUSED-MASS FOUND IN ASCENDING COLON  . Diverticulosis   . HLD (hyperlipidemia)   . Obesity   . Internal hemorrhoids   . Osteoporosis   . Iron deficiency anemia 2013  . Blood transfusion MAY 2013  . Heart attack 01/01/2001    AUG 2002 HEART STENT PLACEMENT  . Hypertension   . Complication of anesthesia 2009    HIP REPLACEMENT-PT HAD HARD TIME WAKING UP--FELT LIKE SHE COULDN'T BREATHE  . CAD (coronary artery disease)     "SINGLE VESSEL CORONARY DISEASE WITH CX-OM-2 STENTING IN 2002 AND CARDIAC BALLOON ATHERECTOMY FOR IN-STENT RESTENOSIS IN 2003 ICH WAS WIDELY PATENT ON CATHETERIZATION IN 2004 " - PER CARDIOLOGY OFFICE NOTE 11/23/11 FROM DR. Alanda Amass  . Colon cancer 2013  . Multiple lung nodules 08/2012    scattered, bilateral but right>left.   [name] Kitchen DJD (degenerative joint disease)     of spine  .  Heart murmur    Past Surgical History  Procedure Laterality Date  . Appendectomy    . Cholecystectomy    . Coronary angioplasty with stent placement    . Colonoscopy w/ biopsies and polypectomy  06/15/2008    adenomatous polyps, diverticulosis, internal hemorrhoids  . Total hip arthroplasty  09/08/2007    left, Dr. Lequita Halt  . Colonoscopy  11/16/2011    Procedure: COLONOSCOPY;  Surgeon: Iva Boop, MD;  Location: [facility] ENDOSCOPY;  Service: Endoscopy;  Laterality: N/A;  . Replacement total knee Left 2003  . Total hip arthroplasty Right 2003  . Esophagogastroduodenoscopy N/A 10/23/2012    Procedure: ESOPHAGOGASTRODUODENOSCOPY (EGD);  Surgeon: Louis Meckel, MD;  Location: [facility] ENDOSCOPY;  Service: Endoscopy;  Laterality: N/A;  . Colonoscopy  multiple  . Incisional hernia repair N/A 01/29/2013     Procedure: LAPAROSCOPIC INCISIONAL HERNIA;  Surgeon: Shelly Rubenstein, MD;  Location: [facility] ORS;  Service: General;  Laterality: N/A;  . Insertion of mesh N/A 01/29/2013    Procedure: INSERTION OF MESH;  Surgeon: Shelly Rubenstein, MD;  Location: [facility] ORS;  Service: General;  Laterality: N/A;   Review of Systems As above    Objective:   Physical Exam [facility] elderly NAD  02/18/13 CT abd/pelvis 1. Lobular fluid collection at the site of incisional hernia  repair consistent with either a seroma or abscess extending.  2. Suspicion of abnormal soft tissue in the sigmoid colon  worrisome for sigmoid colon carcinoma.  3. Multiple colonic diverticula primarily within the rectosigmoid  colon.  4. Lobular enlargement of the left adrenal gland may represent  adrenal hyperplasia as noted previously.  5. 7 mm anterolisthesis of L4 on L5 with diffuse degenerative disc  disease throughout the lumbar spine.  6. 5 mm noncalcified nodule in the right middle lobe not  definitely included on prior images. Cannot exclude a metastatic  lesion.     Assessment & Plan:  Abnormal CT scan, sigmoid colon  I think this represents her severe sigmoid diverticulosis. We reviewed 10/2012 colonoscopy and the recent CT images. She is not interested in an endoscopic evaluation at this time. She understands it is possible but very unlikely that a cancer was missed in this region on 10/2012 colon exam. We will consider a repeat colonoscppy on a routine basis when she is 64, to follow-up colon cancer She will see me as needed otherwise 15 mins time spent reviewing her situation and counseling today.  I appreciate the opportunity to care for this patient.  WJ:[id number],AOZ Maisie Fus, MD

## 2013-03-16 NOTE — Assessment & Plan Note (Signed)
Severe and what I think accounts for soft tissue changes on the CT scan

## 2013-03-16 NOTE — Patient Instructions (Addendum)
I hope things improve quickly as far as energy is concerned. We can discuss repeating a colonoscopy in 3 years. Call back sooner if you have gastrointestinal problems.  I appreciate the opportunity to care for you. Iva Boop, MD, Clement[facility] Graham

## 2013-04-13 DIAGNOSIS — M25519 Pain in unspecified shoulder: Secondary | ICD-10-CM | POA: Diagnosis not present

## 2013-05-01 ENCOUNTER — Encounter: Payer: Self-pay | Admitting: Interventional Cardiology

## 2013-05-01 ENCOUNTER — Ambulatory Visit (INDEPENDENT_AMBULATORY_CARE_PROVIDER_SITE_OTHER): Payer: Medicare Other | Admitting: Interventional Cardiology

## 2013-05-01 VITALS — BP 152/80 | HR 97 | Ht 60.0 in | Wt 158.0 lb

## 2013-05-01 DIAGNOSIS — I2510 Atherosclerotic heart disease of native coronary artery without angina pectoris: Secondary | ICD-10-CM

## 2013-05-01 DIAGNOSIS — I10 Essential (primary) hypertension: Secondary | ICD-10-CM

## 2013-05-01 DIAGNOSIS — E785 Hyperlipidemia, unspecified: Secondary | ICD-10-CM

## 2013-05-01 NOTE — Patient Instructions (Signed)
Your physician wants you to follow-up in: 6 months with Dr. Varanasi. You will receive a reminder letter in the mail two months in advance. If you don't receive a letter, please call our office to schedule the follow-up appointment.  Your physician recommends that you continue on your current medications as directed. Please refer to the Current Medication list given to you today.  

## 2013-05-01 NOTE — Progress Notes (Signed)
Patient ID: JESLYNN HOLLANDER, female   DOB: Jan 13, 1928, 79 y.o.   MRN: [id number]     Patient ID: MAKENSIE MULHALL MRN: [id number] DOB/AGE: 01-22-1928 79 y.o.   Referring Physician Dr. Pete Glatter   Reason for Consultation coronary artery disease  HPI: 79 rolled woman who has a history of colon cancer. She has a long history of coronary artery disease. This began in 2002. At that time, she had a stent to the circumflex system. In 2003 she had Cutting Balloon angioplasty for in-stent restenosis. In 2004 that stent was found to be patent. In January 2014, she was hospitalized and was found to have an 80-90% proximal RCA stenosis. A promus drug-eluting stent, 3.0 x 15 was placed. Her OM 2 stent was widely patent.  She had problems with dual antiplatelet therapy. She states that she tried Plavix, F. and in [place]. She had fatigue and shortness of breath. She also had an upper GI bleed. She are requested if she requires a stent, that she wants one that does not require prolonged dual antiplatelet therapy.  The workup of her dyspnea has included an echocardiogram showing normal left ventricular function, normal BNP. There is no evidence of pulmonary hypertension. She had a chest CT scan is negative for pulmonary embolus. Her Plavix was stopped at her last visit with Dr. Alanda Amass in July of 2014.  She denies any cardiac symptoms. She does not exercise regularly due to  joint pains. She has not had any recent symptoms like what she had prior stents.   Current Outpatient Prescriptions  Medication Sig Dispense Refill  . acetaminophen (TYLENOL) 325 MG tablet Take 650 mg by mouth every 6 (six) hours as needed. For pain      . albuterol (PROVENTIL HFA;VENTOLIN HFA) 108 (90 BASE) MCG/ACT inhaler Inhale 2 puffs into the lungs every 6 (six) hours as needed for wheezing.      [name] Kitchen aspirin 162 MG EC tablet Take 81 mg by mouth daily.       . B Complex Vitamins (VITAMIN B COMPLEX) TABS Take by mouth. Take 1/2 tab       . Biotin 5000 MCG CAPS Take 5,000 mcg by mouth daily.      . calcium citrate-vitamin D (CITRACAL+D) 315-200 MG-UNIT per tablet Take 1 tablet by mouth daily.      . cetirizine (ZYRTEC) 10 MG tablet Take 5 mg by mouth 2 (two) times daily.       . ferrous sulfate 325 (65 FE) MG tablet Take 325 mg by mouth daily with breakfast. Down to 3 iron tablets per week      . lovastatin (ALTOPREV) 20 MG 24 hr tablet Take 20 mg by mouth at bedtime.      . metoprolol (LOPRESSOR) 50 MG tablet Take 25 mg by mouth 2 (two) times daily.       . traMADol (ULTRAM) 50 MG tablet Take 50 mg by mouth every 6 (six) hours as needed for pain.       [name] Kitchen triamterene-hydrochlorothiazide (DYAZIDE) 37.5-25 MG per capsule Take 1 capsule by mouth every morning.       No current facility-administered medications for this visit.   Past Medical History  Diagnosis Date  . Arthritis   . Asthma   . Adenomatous colon polyp MAY 2013    HOSPITALIZED AT [facility] WITH HGB 7 -TRANSFUSED-MASS FOUND IN ASCENDING COLON  . Diverticulosis   . HLD (hyperlipidemia)   . Obesity   . Internal hemorrhoids   .  Osteoporosis   . Iron deficiency anemia 2013  . Blood transfusion MAY 2013  . Heart attack 01/01/2001    AUG 2002 HEART STENT PLACEMENT  . Hypertension   . Complication of anesthesia 2009    HIP REPLACEMENT-PT HAD HARD TIME WAKING UP--FELT LIKE SHE COULDN'T BREATHE  . CAD (coronary artery disease)     "SINGLE VESSEL CORONARY DISEASE WITH CX-OM-2 STENTING IN 2002 AND CARDIAC BALLOON ATHERECTOMY FOR IN-STENT RESTENOSIS IN 2003 ICH WAS WIDELY PATENT ON CATHETERIZATION IN 2004 " - PER CARDIOLOGY OFFICE NOTE 11/23/11 FROM DR. Alanda Amass  . Colon cancer 2013  . Multiple lung nodules 08/2012    scattered, bilateral but right>left.   [name] Kitchen DJD (degenerative joint disease)     of spine  . Heart murmur     Family History  Problem Relation Age of Onset  . Pancreatic cancer Father   . Prostate cancer Father   . Pancreatic cancer Brother   . Heart  disease Mother   . Heart disease Sister   . Colon cancer Brother     ? Colostomy  . Prostate cancer Brother   . Colon cancer Sister     History   Social History  . Marital Status: Married    Spouse Name: N/A    Number of Children: 2  . Years of Education: N/A   Occupational History  . retired    Social History Main Topics  . Smoking status: Former Smoker -- 30 years    Types: Cigarettes    Quit date: 07/02/1982  . Smokeless tobacco: Never Used  . Alcohol Use: Yes     Comment: occ wine  . Drug Use: No  . Sexual Activity: Not on file   Other Topics Concern  . Not on file   Social History Narrative   Married, Agricultural consultant at [employer]    Past Surgical History  Procedure Laterality Date  . Appendectomy    . Cholecystectomy    . Coronary angioplasty with stent placement    . Colonoscopy w/ biopsies and polypectomy  06/15/2008    adenomatous polyps, diverticulosis, internal hemorrhoids  . Total hip arthroplasty  09/08/2007    left, Dr. Lequita Halt  . Colonoscopy  11/16/2011    Procedure: COLONOSCOPY;  Surgeon: Iva Boop, MD;  Location: [facility] ENDOSCOPY;  Service: Endoscopy;  Laterality: N/A;  . Replacement total knee Left 2003  . Total hip arthroplasty Right 2003  . Esophagogastroduodenoscopy N/A 10/23/2012    Procedure: ESOPHAGOGASTRODUODENOSCOPY (EGD);  Surgeon: Louis Meckel, MD;  Location: [facility] ENDOSCOPY;  Service: Endoscopy;  Laterality: N/A;  . Colonoscopy  multiple  . Incisional hernia repair N/A 01/29/2013    Procedure: LAPAROSCOPIC INCISIONAL HERNIA;  Surgeon: Shelly Rubenstein, MD;  Location: [facility] ORS;  Service: General;  Laterality: N/A;  . Insertion of mesh N/A 01/29/2013    Procedure: INSERTION OF MESH;  Surgeon: Shelly Rubenstein, MD;  Location: [facility] ORS;  Service: General;  Laterality: N/A;      (Not in a hospital admission)  Review of systems complete and found to be negative unless listed above .  No nausea, vomiting.  No fever chills, No focal weakness,   No palpitations.  Physical Exam: Filed Vitals:   05/01/13 1400  BP: 152/80  Pulse: 97    Weight: 158 lb (71.668 kg)  Physical exam:   [place]/AT EOMI No JVD, No carotid bruit RRR S1S2  No wheezing Soft. NT, nondistended No edema. No focal motor or sensory deficits Normal affect  Labs:   Lab Results  Component Value Date   WBC 7.7 01/21/2013   HGB 12.5 01/21/2013   HCT 39.1 01/21/2013   MCV 90.9 01/21/2013   PLT 289 01/21/2013   No results found for this basename: NA, K, CL, CO2, BUN, CREATININE, CALCIUM, LABALBU, PROT, BILITOT, ALKPHOS, ALT, AST, GLUCOSE,  in the last 168 hours Lab Results  Component Value Date   CKTOTAL 82 11/14/2011   CKMB 2.5 11/14/2011   TROPONINI <0.30 07/15/2012    Lab Results  Component Value Date   CHOL 122 10/23/2012   CHOL 158 07/15/2012   Lab Results  Component Value Date   HDL 67 10/23/2012   HDL 90 4/54/0981   Lab Results  Component Value Date   LDLCALC 32 10/23/2012   LDLCALC 48 07/15/2012   Lab Results  Component Value Date   TRIG 116 10/23/2012   TRIG 99 07/15/2012   Lab Results  Component Value Date   CHOLHDL 1.8 10/23/2012   CHOLHDL 1.8 07/15/2012   No results found for this basename: LDLDIRECT     EKG: Normal in July 2014  ASSESSMENT AND PLAN: Coronary artery disease: Stable. No angina. I've asked her to stay on her current dose of aspirin until January 2015. At that time, she can decrease to 81 mg daily. Hyperlipidemia: LDL is controlled in January. Continue lipid-lowering therapy. Hypertension: Controlled on metoprolol and Maxzide. She is to check her blood pressure on occasion at home. Is a little bit high today in the doctor's office.  I'll see her back in 6 months.  Signed:   Fredric Mare, MD, [facility] 05/01/2013, 2:35 PM

## 2013-05-06 DIAGNOSIS — M5030 Other cervical disc degeneration, unspecified cervical region: Secondary | ICD-10-CM | POA: Diagnosis not present

## 2013-05-06 DIAGNOSIS — M25519 Pain in unspecified shoulder: Secondary | ICD-10-CM | POA: Diagnosis not present

## 2013-05-07 ENCOUNTER — Other Ambulatory Visit: Payer: Self-pay

## 2013-05-11 DIAGNOSIS — I129 Hypertensive chronic kidney disease with stage 1 through stage 4 chronic kidney disease, or unspecified chronic kidney disease: Secondary | ICD-10-CM | POA: Diagnosis not present

## 2013-05-11 DIAGNOSIS — D649 Anemia, unspecified: Secondary | ICD-10-CM | POA: Diagnosis not present

## 2013-05-11 DIAGNOSIS — R911 Solitary pulmonary nodule: Secondary | ICD-10-CM | POA: Diagnosis not present

## 2013-05-11 DIAGNOSIS — E7800 Pure hypercholesterolemia, unspecified: Secondary | ICD-10-CM | POA: Diagnosis not present

## 2013-05-11 DIAGNOSIS — M5090 Cervical disc disorder, unspecified, unspecified cervical region: Secondary | ICD-10-CM | POA: Diagnosis not present

## 2013-05-11 DIAGNOSIS — Z79899 Other long term (current) drug therapy: Secondary | ICD-10-CM | POA: Diagnosis not present

## 2013-05-12 ENCOUNTER — Telehealth: Payer: Self-pay | Admitting: Interventional Cardiology

## 2013-05-12 NOTE — Telephone Encounter (Signed)
Jeremy, please advise.  

## 2013-05-12 NOTE — Telephone Encounter (Signed)
To Jeremy.  

## 2013-05-12 NOTE — Telephone Encounter (Signed)
New Problem:  Pt states she will be getting a shot for her cervical stanosis... Pt states they advised her to stop her aspirin for 5 days. Pt would like to be advised if that is a good idea or not.[name] KitchenMarland Kitchen

## 2013-05-12 NOTE — Telephone Encounter (Signed)
I would suggest stopping aspirin 5 days prior to procedure as I suspect it will be an epidural steroid injection and want to reduce likelihood of hematoma.  To Dr. Eldridge Dace to assess as I'm not familiar with patient.

## 2013-05-13 NOTE — Telephone Encounter (Signed)
Ok to stop aspirin for 5 days prior to reduce risk of bleeding.

## 2013-05-13 NOTE — Telephone Encounter (Signed)
Pt's husband notified.

## 2013-05-26 DIAGNOSIS — M5030 Other cervical disc degeneration, unspecified cervical region: Secondary | ICD-10-CM | POA: Diagnosis not present

## 2013-06-03 DIAGNOSIS — M25559 Pain in unspecified hip: Secondary | ICD-10-CM | POA: Diagnosis not present

## 2013-06-03 DIAGNOSIS — M1710 Unilateral primary osteoarthritis, unspecified knee: Secondary | ICD-10-CM | POA: Diagnosis not present

## 2013-06-10 DIAGNOSIS — M1710 Unilateral primary osteoarthritis, unspecified knee: Secondary | ICD-10-CM | POA: Diagnosis not present

## 2013-07-01 DIAGNOSIS — G5600 Carpal tunnel syndrome, unspecified upper limb: Secondary | ICD-10-CM | POA: Diagnosis not present

## 2013-07-14 DIAGNOSIS — H10439 Chronic follicular conjunctivitis, unspecified eye: Secondary | ICD-10-CM | POA: Diagnosis not present

## 2013-07-20 DIAGNOSIS — M79609 Pain in unspecified limb: Secondary | ICD-10-CM | POA: Diagnosis not present

## 2013-07-23 DIAGNOSIS — Z1231 Encounter for screening mammogram for malignant neoplasm of breast: Secondary | ICD-10-CM | POA: Diagnosis not present

## 2013-07-28 DIAGNOSIS — H25099 Other age-related incipient cataract, unspecified eye: Secondary | ICD-10-CM | POA: Diagnosis not present

## 2013-07-30 DIAGNOSIS — M1710 Unilateral primary osteoarthritis, unspecified knee: Secondary | ICD-10-CM | POA: Diagnosis not present

## 2013-07-30 DIAGNOSIS — M79609 Pain in unspecified limb: Secondary | ICD-10-CM | POA: Diagnosis not present

## 2013-08-04 DIAGNOSIS — G5600 Carpal tunnel syndrome, unspecified upper limb: Secondary | ICD-10-CM | POA: Diagnosis not present

## 2013-08-10 ENCOUNTER — Other Ambulatory Visit: Payer: Self-pay | Admitting: Geriatric Medicine

## 2013-08-10 DIAGNOSIS — I129 Hypertensive chronic kidney disease with stage 1 through stage 4 chronic kidney disease, or unspecified chronic kidney disease: Secondary | ICD-10-CM | POA: Diagnosis not present

## 2013-08-10 DIAGNOSIS — Z0000 Encounter for general adult medical examination without abnormal findings: Secondary | ICD-10-CM | POA: Diagnosis not present

## 2013-08-10 DIAGNOSIS — I729 Aneurysm of unspecified site: Secondary | ICD-10-CM | POA: Diagnosis not present

## 2013-08-10 DIAGNOSIS — Z79899 Other long term (current) drug therapy: Secondary | ICD-10-CM | POA: Diagnosis not present

## 2013-08-10 DIAGNOSIS — Z1331 Encounter for screening for depression: Secondary | ICD-10-CM | POA: Diagnosis not present

## 2013-08-10 DIAGNOSIS — R911 Solitary pulmonary nodule: Secondary | ICD-10-CM

## 2013-08-10 DIAGNOSIS — E7800 Pure hypercholesterolemia, unspecified: Secondary | ICD-10-CM | POA: Diagnosis not present

## 2013-08-10 DIAGNOSIS — N183 Chronic kidney disease, stage 3 unspecified: Secondary | ICD-10-CM | POA: Diagnosis not present

## 2013-08-12 DIAGNOSIS — M1710 Unilateral primary osteoarthritis, unspecified knee: Secondary | ICD-10-CM | POA: Diagnosis not present

## 2013-08-17 ENCOUNTER — Ambulatory Visit
Admission: RE | Admit: 2013-08-17 | Discharge: 2013-08-17 | Disposition: A | Discharge status: Home / Self Care | Payer: Medicare Other | Source: Ambulatory Visit | Attending: Geriatric Medicine | Admitting: Geriatric Medicine

## 2013-08-17 DIAGNOSIS — M1710 Unilateral primary osteoarthritis, unspecified knee: Secondary | ICD-10-CM | POA: Diagnosis not present

## 2013-08-17 DIAGNOSIS — G9389 Other specified disorders of brain: Secondary | ICD-10-CM | POA: Diagnosis not present

## 2013-08-17 DIAGNOSIS — J984 Other disorders of lung: Secondary | ICD-10-CM | POA: Diagnosis not present

## 2013-08-17 DIAGNOSIS — R911 Solitary pulmonary nodule: Secondary | ICD-10-CM

## 2013-08-17 DIAGNOSIS — I729 Aneurysm of unspecified site: Secondary | ICD-10-CM

## 2013-08-17 MED ORDER — GADOBENATE DIMEGLUMINE 529 MG/ML IV SOLN
14.0000 mL | Freq: Once | INTRAVENOUS | Status: AC | PRN
  Administered 2013-08-17: 14 mL via INTRAVENOUS

## 2013-08-19 DIAGNOSIS — M1710 Unilateral primary osteoarthritis, unspecified knee: Secondary | ICD-10-CM | POA: Diagnosis not present

## 2013-08-26 DIAGNOSIS — M1710 Unilateral primary osteoarthritis, unspecified knee: Secondary | ICD-10-CM | POA: Diagnosis not present

## 2013-09-07 DIAGNOSIS — M1710 Unilateral primary osteoarthritis, unspecified knee: Secondary | ICD-10-CM | POA: Diagnosis not present

## 2013-09-07 DIAGNOSIS — M25519 Pain in unspecified shoulder: Secondary | ICD-10-CM | POA: Diagnosis not present

## 2013-10-12 DIAGNOSIS — H6120 Impacted cerumen, unspecified ear: Secondary | ICD-10-CM | POA: Diagnosis not present

## 2013-12-08 ENCOUNTER — Telehealth: Payer: Self-pay | Admitting: Interventional Cardiology

## 2013-12-08 NOTE — Telephone Encounter (Signed)
To Dr. Varanasi, please advise.  

## 2013-12-08 NOTE — Telephone Encounter (Signed)
lmtrc

## 2013-12-08 NOTE — Telephone Encounter (Signed)
Would see if she can stay on aspirin for dental procedure. Please check with dentist.

## 2013-12-08 NOTE — Telephone Encounter (Signed)
Follow up     Returning Amy's call

## 2013-12-08 NOTE — Telephone Encounter (Signed)
Pt notified to check with dentist to see if she can stay on ASA. Faxed to Dentist.

## 2013-12-08 NOTE — Telephone Encounter (Signed)
New message     Patient having a tooth extraction - please advise when she should stop baby ASA.

## 2013-12-23 IMAGING — CR DG CHEST 1V PORT
1 series · 1 of 1 positions shown · non-contrast
Comparison: Portable exam 0012 hours compared to 08/27/2011

CLINICAL DATA: Chest pain, shortness of breath, history asthma,
hypertension, coronary artery disease post MI

PORTABLE CHEST - 1 VIEW

[AP]
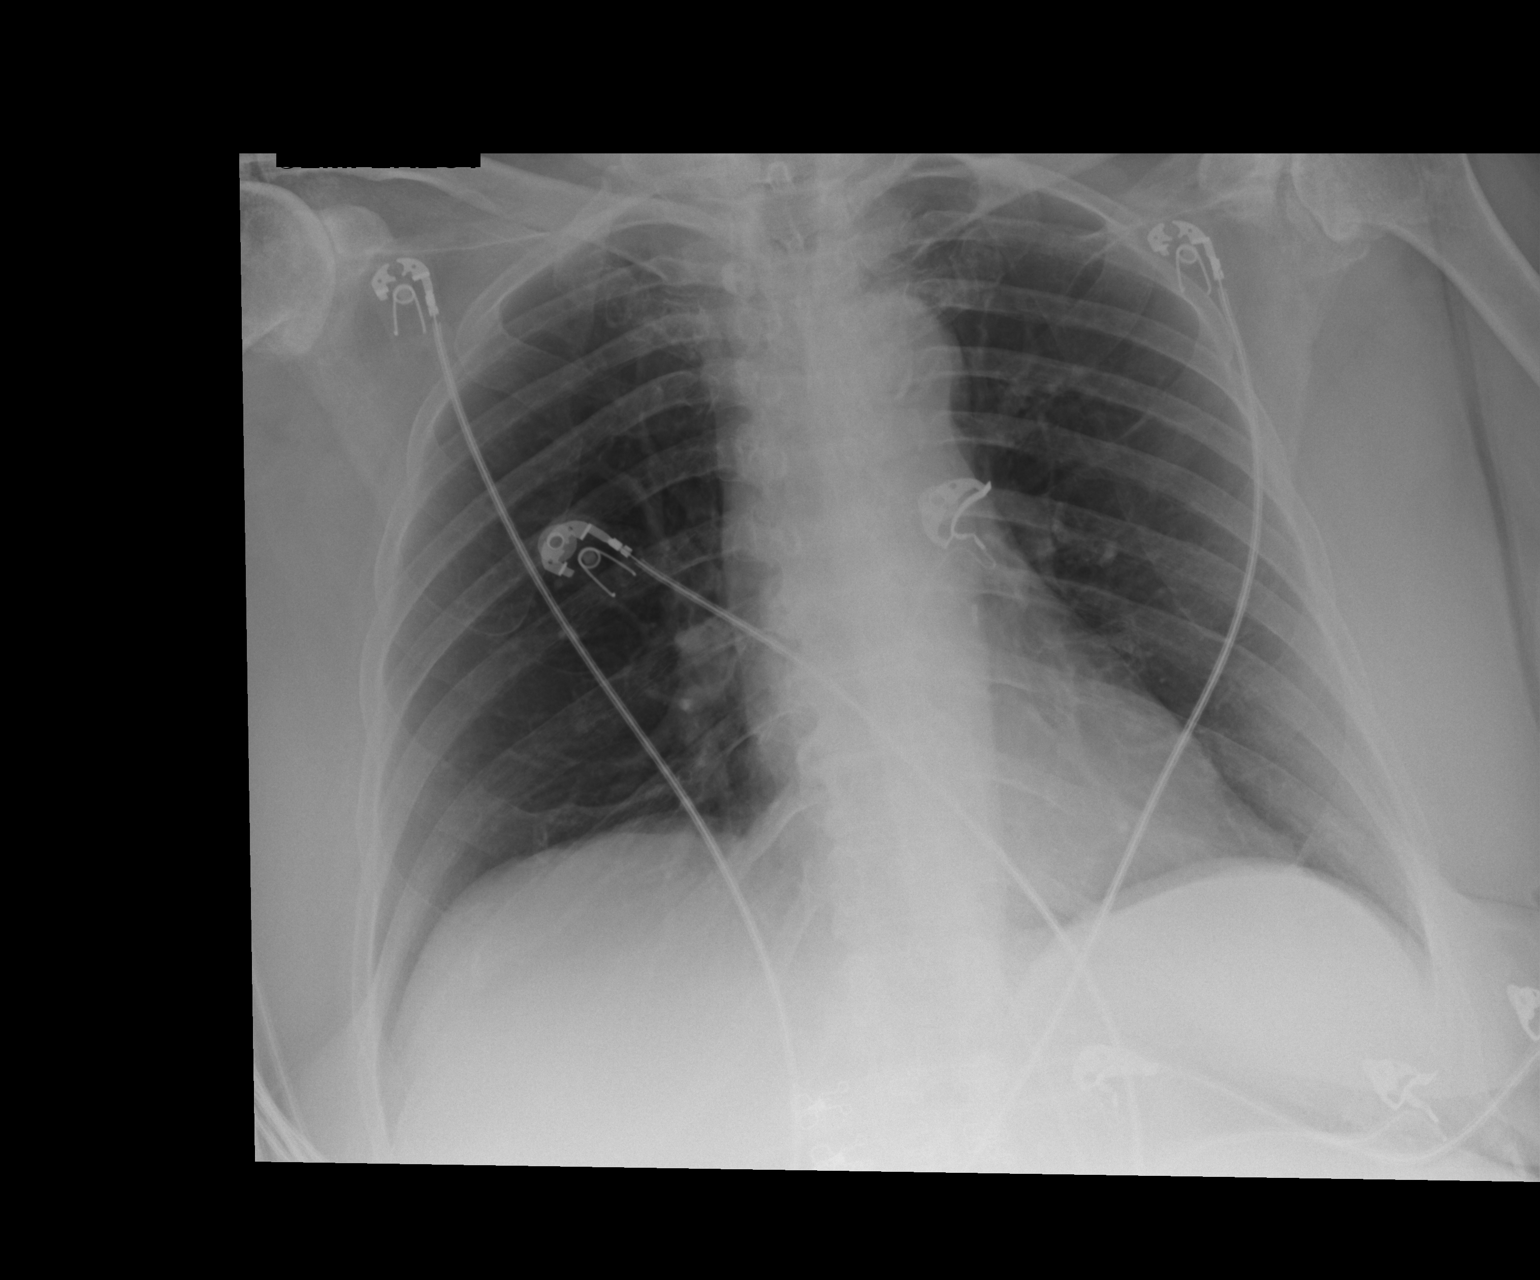

[1 of 1 positions shown; findings below may reference images not displayed]

FINDINGS: Normal heart size, mediastinal contours, and pulmonary vascularity.
Atherosclerotic calcification aortic arch.
Lungs clear.
No pleural effusion or pneumothorax.
Bilateral glenohumeral degenerative changes and minimal scattered
endplate spur formation thoracic spine.
IMPRESSION: No acute abnormalities.

## 2014-01-29 DIAGNOSIS — M25519 Pain in unspecified shoulder: Secondary | ICD-10-CM | POA: Diagnosis not present

## 2014-02-15 DIAGNOSIS — E669 Obesity, unspecified: Secondary | ICD-10-CM | POA: Diagnosis not present

## 2014-02-15 DIAGNOSIS — R5381 Other malaise: Secondary | ICD-10-CM | POA: Diagnosis not present

## 2014-02-15 DIAGNOSIS — Z23 Encounter for immunization: Secondary | ICD-10-CM | POA: Diagnosis not present

## 2014-02-15 DIAGNOSIS — I129 Hypertensive chronic kidney disease with stage 1 through stage 4 chronic kidney disease, or unspecified chronic kidney disease: Secondary | ICD-10-CM | POA: Diagnosis not present

## 2014-02-15 DIAGNOSIS — N183 Chronic kidney disease, stage 3 unspecified: Secondary | ICD-10-CM | POA: Diagnosis not present

## 2014-02-15 DIAGNOSIS — Z79899 Other long term (current) drug therapy: Secondary | ICD-10-CM | POA: Diagnosis not present

## 2014-02-15 DIAGNOSIS — Z6832 Body mass index (BMI) 32.0-32.9, adult: Secondary | ICD-10-CM | POA: Diagnosis not present

## 2014-02-15 DIAGNOSIS — D649 Anemia, unspecified: Secondary | ICD-10-CM | POA: Diagnosis not present

## 2014-03-11 ENCOUNTER — Encounter: Payer: Self-pay | Admitting: Cardiology

## 2014-03-11 ENCOUNTER — Ambulatory Visit (INDEPENDENT_AMBULATORY_CARE_PROVIDER_SITE_OTHER): Payer: Medicare Other | Admitting: Cardiology

## 2014-03-11 VITALS — BP 124/80 | HR 82 | Ht 60.0 in | Wt 163.2 lb

## 2014-03-11 DIAGNOSIS — E669 Obesity, unspecified: Secondary | ICD-10-CM

## 2014-03-11 DIAGNOSIS — I2129 ST elevation (STEMI) myocardial infarction involving other sites: Secondary | ICD-10-CM | POA: Diagnosis not present

## 2014-03-11 DIAGNOSIS — I2510 Atherosclerotic heart disease of native coronary artery without angina pectoris: Secondary | ICD-10-CM

## 2014-03-11 DIAGNOSIS — Z9861 Coronary angioplasty status: Secondary | ICD-10-CM | POA: Diagnosis not present

## 2014-03-11 DIAGNOSIS — Z789 Other specified health status: Secondary | ICD-10-CM

## 2014-03-11 DIAGNOSIS — I10 Essential (primary) hypertension: Secondary | ICD-10-CM | POA: Diagnosis not present

## 2014-03-11 DIAGNOSIS — I2121 ST elevation (STEMI) myocardial infarction involving left circumflex coronary artery: Secondary | ICD-10-CM

## 2014-03-11 DIAGNOSIS — E785 Hyperlipidemia, unspecified: Secondary | ICD-10-CM | POA: Diagnosis not present

## 2014-03-11 NOTE — Patient Instructions (Addendum)
Your physician recommends that you schedule a follow-up appointment in: [place].   Continue with current medications

## 2014-03-12 ENCOUNTER — Ambulatory Visit: Payer: Medicare Other | Admitting: Interventional Cardiology

## 2014-03-14 ENCOUNTER — Encounter: Payer: Self-pay | Admitting: Cardiology

## 2014-03-14 DIAGNOSIS — Z789 Other specified health status: Secondary | ICD-10-CM | POA: Insufficient documentation

## 2014-03-14 DIAGNOSIS — I10 Essential (primary) hypertension: Secondary | ICD-10-CM | POA: Insufficient documentation

## 2014-03-14 DIAGNOSIS — E785 Hyperlipidemia, unspecified: Secondary | ICD-10-CM | POA: Insufficient documentation

## 2014-03-14 DIAGNOSIS — E669 Obesity, unspecified: Secondary | ICD-10-CM | POA: Insufficient documentation

## 2014-03-14 NOTE — Assessment & Plan Note (Signed)
Stable with current medicines 

## 2014-03-14 NOTE — Assessment & Plan Note (Signed)
In the future would strongly consider BMS stent placement if required because of her intolerance.

## 2014-03-14 NOTE — Assessment & Plan Note (Signed)
I have not seen a recent check of lipids, usually followed by PCP. She is on statin.[name] Kitchen

## 2014-03-14 NOTE — Assessment & Plan Note (Signed)
This was in the setting of in-stent thrombosis and the stent was widely patent during her last catheterization 11 years following cutting balloon angioplasty. Since then she had an abnormal stress test which was probably falsely positive suggesting anterior and apical ischemia. Did not show any signs of infarct, and her echocardiogram did not have any regional wall motion abnormalities.

## 2014-03-14 NOTE — Progress Notes (Signed)
PCP: Mathews Argyle, MD  Clinic Note: Chief Complaint  Patient presents with  . Annual Exam    LAST VISIT 7/14 WITH DR Rollene Fare, no chest pain , sob- allergy , no edema    HPI: [name] is a 80 y.o. female with a PMH below who presents today for cardiology followup. She was a former patient of Dr. Terance Ice whom I performed a PCI on in the past. When Dr. Rollene Fare retired, and she requested a cardiologist outside of his former practice and was seen by Dr. Irish Lack on October 2014.  She is decided she would like to return to this office, and since I performed or catheterization back in January 2014, she requested to be seen by me. Dr. Rollene Fare and evaluated her dyspnea with an echocardiogram the results are normal with no evidence of pulmonary attention or abnormal left ventricle the findings. A chest CT did not show any evidence of pulmonary emboli.  Past Medical History  Diagnosis Date  . CAD S/P percutaneous coronary angioplasty 2002, 2003,2014    a) Unstable Angina 7/'02: 1st Cutting PTCA- OM2 --> restenosed 9/'02 NSTEMI--> 3.0 mm x 12 mm BMS-OM 2; b) 11/'03 DOE w/ + Cardiolite - Cutter PTCA for ISR -- patent in 2004 (after False + Cardiolite); c) 07/2012 - Unstable Angina -- PCI to proximal RCA with Promus Premier DES 2.75 mm x 20 mm (3.0 mm), patent OM2 stent ~10% ISR ([facility] [place])  . Non-Q wave ST elevation myocardial infarction (STEMI) involving left circumflex coronary artery 03/2001    a) Severe thrombic ISR of prior PTCA site in OM2 --> BMS PCI; b) Echo 08/2012: Normal LV size & function.  EF 55-60%, no regional WMA, Gr 1 DD, mild MR, mild-mod TR - NO Pulmonary HTN  . Coronary stent restenosis due to scar tissue 05/2002    Cutting Balloon PTCA to OM2 BMS  . Medication intolerance - Plavix, Effient, Brilinta     Easy bruising, GI bleeds (led to Dx of Colon CA), Brilinta - dyspnea  . Hyperlipidemia with target LDL less than 70   . Essential hypertension   . Aortic  sclerosis     With murmur  . Asthma   . Multiple lung nodules on CT 08/2012    scattered, bilateral but right>left.   . H/O Colon cancer 10/2011    GI - Drs. Carlean Purl - colonoscopy 10/2012: diverticulosis, stable ileocolic anastomosis of R colon  . Adenomatous colon polyp MAY 2013    HOSPITALIZED AT [facility] WITH HGB 7 -TRANSFUSED-MASS FOUND IN ASCENDING COLON  . Iron deficiency anemia due to chronic blood loss 10/2011    Blood Transfusion (EGD 09/2012 - unremarkable)  . Internal hemorrhoids   . Diverticulosis 10/2012    Seen on Colonoscopy  . Osteoarthritis (arthritis due to wear and tear of joints)     Bilateral Hip Arthroplasty, L Knee TKA  . DJD (degenerative joint disease), thoracolumbar     Back, Hips & Knees  . Osteoporosis   . Complication of anesthesia 2009    HIP REPLACEMENT-PT HAD HARD TIME WAKING UP--FELT LIKE SHE COULDN'T BREATHE   Interval History: She presents today actually doing quite well with no major complaints. She is of course forceful in stating that she refuses to use Plavix and would hope not having further stents placed. Apparently she had some bruising and dyspnea following her last PCI and insisted on coming off of the antiplatelet agents after 6 months. Tried Brilinta because of her past intolerance applied  and she had dyspnea. Dr. Rollene Fare finally coalesced and stopped Plavix after 6 months because her multiple complaints. She currently has no rest or exertional dyspnea or or angina. She is a little bit congested with a dry cough a sister with allergies. She's not really exercising mostly because of her hip and knee discomfort after surgeries. With routine activities however she is not noticing significant symptoms.  She denies heart failure symptoms of PND, orthopnea or edema.  No palpitations, lightheadedness, dizziness, weakness or syncope/near syncope. No claudication.  ROS: A comprehensive Review of Systems - was performed. Review of Systems  Constitutional:  Negative.  Negative for malaise/fatigue.  HENT: Positive for congestion. Negative for nosebleeds.   Eyes: Negative for blurred vision and double vision.  Respiratory: Positive for cough and shortness of breath. Negative for hemoptysis and wheezing.        Dry chronic cough and congestion and allergies  Cardiovascular: Negative.        Per history of present illness  Gastrointestinal: Negative for abdominal pain, constipation, blood in stool and melena.  Genitourinary: Negative for hematuria.  Musculoskeletal: Positive for back pain and joint pain. Negative for falls and myalgias.       Back hips and knees -- see PMH No myalgias or cramps  Neurological: Negative for dizziness, sensory change, speech change, focal weakness, seizures, loss of consciousness and weakness.       No TIA or amaurosis fugax  Endo/Heme/Allergies: Bruises/bleeds easily.       Absolutely refuses to take antiplatelet agents    Current Outpatient Prescriptions on File Prior to Visit  Medication Sig Dispense Refill  . acetaminophen (TYLENOL) 325 MG tablet Take 650 mg by mouth every 6 (six) hours as needed. For pain      . albuterol (PROVENTIL HFA;VENTOLIN HFA) 108 (90 BASE) MCG/ACT inhaler Inhale 2 puffs into the lungs every 6 (six) hours as needed for wheezing.      [name] Kitchen aspirin 162 MG EC tablet Take 81 mg by mouth daily.       . Biotin 5000 MCG CAPS Take 5,000 mcg by mouth daily.      . calcium citrate-vitamin D (CITRACAL+D) 315-200 MG-UNIT per tablet Take 1 tablet by mouth daily.      . cetirizine (ZYRTEC) 10 MG tablet Take 5 mg by mouth 2 (two) times daily.       [name] Kitchen lovastatin (ALTOPREV) 20 MG 24 hr tablet Take 20 mg by mouth at bedtime.      . metoprolol (LOPRESSOR) 50 MG tablet Take 25 mg by mouth 2 (two) times daily.       . traMADol (ULTRAM) 50 MG tablet Take 50 mg by mouth every 6 (six) hours as needed for pain.       [name] Kitchen triamterene-hydrochlorothiazide (DYAZIDE) 37.5-25 MG per capsule Take 1 capsule by mouth every  morning.       No current facility-administered medications on file prior to visit.   Allergies  Allergen Reactions  . Atorvastatin Shortness Of Breath  . Brilinta [Ticagrelor] Shortness Of Breath    BLEEDING  . Clopidogrel Bisulfate Shortness Of Breath    BRUISING  . Codeine Nausea And Vomiting  . Sulfonamide Derivatives Other (See Comments)    Intestinal upset   History   Social History Narrative   Married, Mother of 2 adopted children. She has 3 grandchildren.   She works as a Psychologist, occupational at [employer].   She quit smoking in 1984, and does not drink alcohol.  [place] Reviewed in EPIC  Wt Readings from Last 3 Encounters:  03/11/14 163 lb 3.2 oz (74.027 kg)  05/01/13 158 lb (71.668 kg)  03/16/13 154 lb 2 oz (69.911 kg)    PHYSICAL EXAM BP 124/80  Pulse 82  Ht 5' (1.524 m)  Wt 163 lb 3.2 oz (74.027 kg)  BMI 31.87 kg/m2 General appearance: alert, cooperative, appears stated age, no distress and mildly obese HEENT: [place]/AT, EOMI, MMM, anicteric sclera Neck: no adenopathy, no carotid bruit and no JVD Lungs: clear to auscultation bilaterally, normal percussion bilaterally and non-labored Heart: regular rate and rhythm, S1, S2 normal, no click, rub or gallop ; 2/6 SEM RUSB; unable to palpate PMI Abdomen: soft, non-tender; bowel sounds normal; no masses,  no organomegaly; truncal obesity Extremities: extremities normal, atraumatic, no cyanosis, and edema; no femoral bruit Pulses: 2+ and symmetric; Neurologic: Mental status: Alert, oriented, thought content appropriate; Cranial nerves: normal (II-XII grossly intact)   Adult ECG Report  Rate: 82 ;  Rhythm: normal sinus rhythm - borderline low voltage  Narrative Interpretation: Stable/normal EKG  Recent Labs:  From PCP 02/15/2014   Lipids panel not provided  LFTs normal TSH 1.4 (normal)  CBC: WBC 7.7, H./H. 13/38.7, platelets 217  BMP: Sodium 138, potassium 3.8, chloride 101, bicarbonate 28, BUN 16, creatinine 1.01,  glucose 110, calcium 10.1   ASSESSMENT / PLAN: CAD S/P PCI: OM2 PTCA 7/02, OM2 Stent 9/02, ISR 11/03 - PTCA; RCA DES 1 /14/14 No active symptoms of angina or failure. She even notes that her dyspnea is improved. No longer on DAPT by request. This is a bit scary since she has had in-stent thrombosis in the past, that was long ago. She is on beta blocker, aspirin and statin. Normal EF, no requirement for ACE inhibitor/ARB.  Medication intolerance - Plavix, Effient, Brilinta In the future would strongly consider BMS stent placement if required because of her intolerance.  S/P NSTEMI -- involving left circumflex coronary artery (OM2) In-stent Thrombosis This was in the setting of in-stent thrombosis and the stent was widely patent during her last catheterization 11 years following cutting balloon angioplasty. Since then she had an abnormal stress test which was probably falsely positive suggesting anterior and apical ischemia. Did not show any signs of infarct, and her echocardiogram did not have any regional wall motion abnormalities.  Essential hypertension Stable with current medicines  Hyperlipidemia with target LDL less than 70 I have not seen a recent check of lipids, usually followed by PCP. She is on statin..  Obesity (BMI 30-39.9) Discussed importance of dietary modification and exercise. Unfortunately with her arthritis pains the exercise part was difficult. However there are things that she can do such as water aerobics. Also dietary discretion was discussed.    Orders Placed This Encounter  Procedures  . EKG 12-Lead   No orders of the defined types were placed in this encounter.    Followup: 6 months   Gwendlyn Hanback W, M.D., M.S. Interventional Cardiologist   Pager # [phone]

## 2014-03-14 NOTE — Assessment & Plan Note (Signed)
Discussed importance of dietary modification and exercise. Unfortunately with her arthritis pains the exercise part was difficult. However there are things that she can do such as water aerobics. Also dietary discretion was discussed.

## 2014-03-14 NOTE — Assessment & Plan Note (Signed)
No active symptoms of angina or failure. She even notes that her dyspnea is improved. No longer on DAPT by request. This is a bit scary since she has had in-stent thrombosis in the past, that was long ago. She is on beta blocker, aspirin and statin. Normal EF, no requirement for ACE inhibitor/ARB.

## 2014-03-18 DIAGNOSIS — Z23 Encounter for immunization: Secondary | ICD-10-CM | POA: Diagnosis not present

## 2014-03-26 DIAGNOSIS — M79609 Pain in unspecified limb: Secondary | ICD-10-CM | POA: Diagnosis not present

## 2014-06-10 ENCOUNTER — Encounter (HOSPITAL_COMMUNITY): Payer: Self-pay | Admitting: Cardiology

## 2014-07-16 DIAGNOSIS — M65332 Trigger finger, left middle finger: Secondary | ICD-10-CM | POA: Diagnosis not present

## 2014-07-16 DIAGNOSIS — M79645 Pain in left finger(s): Secondary | ICD-10-CM | POA: Diagnosis not present

## 2014-07-16 DIAGNOSIS — M65342 Trigger finger, left ring finger: Secondary | ICD-10-CM | POA: Diagnosis not present

## 2014-08-02 DIAGNOSIS — Z1231 Encounter for screening mammogram for malignant neoplasm of breast: Secondary | ICD-10-CM | POA: Diagnosis not present

## 2014-08-16 DIAGNOSIS — Z1389 Encounter for screening for other disorder: Secondary | ICD-10-CM | POA: Diagnosis not present

## 2014-08-16 DIAGNOSIS — M542 Cervicalgia: Secondary | ICD-10-CM | POA: Diagnosis not present

## 2014-08-16 DIAGNOSIS — Z0000 Encounter for general adult medical examination without abnormal findings: Secondary | ICD-10-CM | POA: Diagnosis not present

## 2014-08-16 DIAGNOSIS — J309 Allergic rhinitis, unspecified: Secondary | ICD-10-CM | POA: Diagnosis not present

## 2014-08-16 DIAGNOSIS — I129 Hypertensive chronic kidney disease with stage 1 through stage 4 chronic kidney disease, or unspecified chronic kidney disease: Secondary | ICD-10-CM | POA: Diagnosis not present

## 2014-08-16 DIAGNOSIS — Z79899 Other long term (current) drug therapy: Secondary | ICD-10-CM | POA: Diagnosis not present

## 2014-08-16 DIAGNOSIS — N183 Chronic kidney disease, stage 3 (moderate): Secondary | ICD-10-CM | POA: Diagnosis not present

## 2014-08-16 DIAGNOSIS — J45909 Unspecified asthma, uncomplicated: Secondary | ICD-10-CM | POA: Diagnosis not present

## 2014-08-16 DIAGNOSIS — E780 Pure hypercholesterolemia: Secondary | ICD-10-CM | POA: Diagnosis not present

## 2014-08-16 DIAGNOSIS — R7309 Other abnormal glucose: Secondary | ICD-10-CM | POA: Diagnosis not present

## 2014-08-16 DIAGNOSIS — R5383 Other fatigue: Secondary | ICD-10-CM | POA: Diagnosis not present

## 2014-08-16 DIAGNOSIS — R911 Solitary pulmonary nodule: Secondary | ICD-10-CM | POA: Diagnosis not present

## 2014-08-17 ENCOUNTER — Other Ambulatory Visit: Payer: Self-pay | Admitting: Geriatric Medicine

## 2014-08-17 ENCOUNTER — Ambulatory Visit
Admission: RE | Admit: 2014-08-17 | Discharge: 2014-08-17 | Disposition: A | Discharge status: Home / Self Care | Payer: Medicare Other | Source: Ambulatory Visit | Attending: Geriatric Medicine | Admitting: Geriatric Medicine

## 2014-08-17 DIAGNOSIS — R911 Solitary pulmonary nodule: Secondary | ICD-10-CM

## 2014-08-17 DIAGNOSIS — M542 Cervicalgia: Secondary | ICD-10-CM

## 2014-08-17 DIAGNOSIS — M47812 Spondylosis without myelopathy or radiculopathy, cervical region: Secondary | ICD-10-CM | POA: Diagnosis not present

## 2014-08-23 ENCOUNTER — Ambulatory Visit
Admission: RE | Admit: 2014-08-23 | Discharge: 2014-08-23 | Disposition: A | Discharge status: Home / Self Care | Payer: Medicare Other | Source: Ambulatory Visit | Attending: Geriatric Medicine | Admitting: Geriatric Medicine

## 2014-08-23 DIAGNOSIS — M12812 Other specific arthropathies, not elsewhere classified, left shoulder: Secondary | ICD-10-CM | POA: Diagnosis not present

## 2014-08-23 DIAGNOSIS — R918 Other nonspecific abnormal finding of lung field: Secondary | ICD-10-CM | POA: Diagnosis not present

## 2014-08-23 DIAGNOSIS — M12811 Other specific arthropathies, not elsewhere classified, right shoulder: Secondary | ICD-10-CM | POA: Diagnosis not present

## 2014-08-23 DIAGNOSIS — R911 Solitary pulmonary nodule: Secondary | ICD-10-CM

## 2014-08-26 DIAGNOSIS — M545 Low back pain: Secondary | ICD-10-CM | POA: Diagnosis not present

## 2014-08-26 DIAGNOSIS — M5136 Other intervertebral disc degeneration, lumbar region: Secondary | ICD-10-CM | POA: Diagnosis not present

## 2014-08-26 DIAGNOSIS — M5032 Other cervical disc degeneration, mid-cervical region: Secondary | ICD-10-CM | POA: Diagnosis not present

## 2014-08-26 DIAGNOSIS — M542 Cervicalgia: Secondary | ICD-10-CM | POA: Diagnosis not present

## 2014-08-31 HISTORY — PX: NM MYOVIEW LTD: HXRAD82

## 2014-09-01 DIAGNOSIS — M5082 Other cervical disc disorders, mid-cervical region: Secondary | ICD-10-CM | POA: Diagnosis not present

## 2014-09-12 ENCOUNTER — Encounter (HOSPITAL_COMMUNITY): Payer: Self-pay

## 2014-09-12 ENCOUNTER — Emergency Department (HOSPITAL_COMMUNITY)
Admission: EM | Admit: 2014-09-12 | Discharge: 2014-09-13 | Discharge status: Against Medical Advice | Payer: Medicare Other | Attending: Emergency Medicine | Admitting: Emergency Medicine

## 2014-09-12 DIAGNOSIS — J45909 Unspecified asthma, uncomplicated: Secondary | ICD-10-CM | POA: Insufficient documentation

## 2014-09-12 DIAGNOSIS — I2510 Atherosclerotic heart disease of native coronary artery without angina pectoris: Secondary | ICD-10-CM | POA: Insufficient documentation

## 2014-09-12 DIAGNOSIS — I252 Old myocardial infarction: Secondary | ICD-10-CM | POA: Insufficient documentation

## 2014-09-12 DIAGNOSIS — I10 Essential (primary) hypertension: Secondary | ICD-10-CM | POA: Diagnosis not present

## 2014-09-12 DIAGNOSIS — R109 Unspecified abdominal pain: Secondary | ICD-10-CM | POA: Diagnosis not present

## 2014-09-12 LAB — CBC WITH DIFFERENTIAL/PLATELET
BASOS ABS: 0 10*3/uL (ref 0.0–0.1)
Basophils Relative: 0 % (ref 0–1)
EOS ABS: 0.2 10*3/uL (ref 0.0–0.7)
EOS PCT: 3 % (ref 0–5)
HCT: 39.9 % (ref 36.0–46.0)
Hemoglobin: 13.4 g/dL (ref 12.0–15.0)
LYMPHS ABS: 1.5 10*3/uL (ref 0.7–4.0)
Lymphocytes Relative: 16 % (ref 12–46)
MCH: 31 pg (ref 26.0–34.0)
MCHC: 33.6 g/dL (ref 30.0–36.0)
MCV: 92.4 fL (ref 78.0–100.0)
MONO ABS: 0.5 10*3/uL (ref 0.1–1.0)
MONOS PCT: 5 % (ref 3–12)
NEUTROS ABS: 6.8 10*3/uL (ref 1.7–7.7)
Neutrophils Relative %: 76 % (ref 43–77)
PLATELETS: 223 10*3/uL (ref 150–400)
RBC: 4.32 MIL/uL (ref 3.87–5.11)
RDW: 12.9 % (ref 11.5–15.5)
WBC: 9.1 10*3/uL (ref 4.0–10.5)

## 2014-09-12 NOTE — ED Notes (Signed)
Patient reports sudden onset of right flank pain this evening.  It has worsened over the course of the night.  Intermittent nausea.  Denies history of kidney stones.

## 2014-09-13 DIAGNOSIS — R109 Unspecified abdominal pain: Secondary | ICD-10-CM | POA: Diagnosis not present

## 2014-09-13 LAB — LIPASE, BLOOD: Lipase: 59 U/L (ref 11–59)

## 2014-09-13 LAB — COMPREHENSIVE METABOLIC PANEL
ALBUMIN: 4.2 g/dL (ref 3.5–5.2)
ALT: 24 U/L (ref 0–35)
AST: 30 U/L (ref 0–37)
Alkaline Phosphatase: 63 U/L (ref 39–117)
Anion gap: 6 (ref 5–15)
BUN: 23 mg/dL (ref 6–23)
CALCIUM: 9.6 mg/dL (ref 8.4–10.5)
CO2: 28 mmol/L (ref 19–32)
Chloride: 101 mmol/L (ref 96–112)
Creatinine, Ser: 1.13 mg/dL — ABNORMAL HIGH (ref 0.50–1.10)
GFR calc Af Amer: 50 mL/min — ABNORMAL LOW (ref >90)
GFR calc non Af Amer: 43 mL/min — ABNORMAL LOW (ref >90)
Glucose, Bld: 157 mg/dL — ABNORMAL HIGH (ref 70–99)
Potassium: 4 mmol/L (ref 3.5–5.1)
SODIUM: 135 mmol/L (ref 135–145)
TOTAL PROTEIN: 7.3 g/dL (ref 6.0–8.3)
Total Bilirubin: 1.3 mg/dL — ABNORMAL HIGH (ref 0.3–1.2)

## 2014-09-19 ENCOUNTER — Encounter (HOSPITAL_COMMUNITY): Payer: Self-pay | Admitting: Emergency Medicine

## 2014-09-19 ENCOUNTER — Emergency Department (HOSPITAL_COMMUNITY): Payer: Medicare Other

## 2014-09-19 ENCOUNTER — Observation Stay (HOSPITAL_COMMUNITY)
Admission: EM | Admit: 2014-09-19 | Discharge: 2014-09-20 | Disposition: A | Discharge status: Home / Self Care | Payer: Medicare Other | Attending: Internal Medicine | Admitting: Internal Medicine

## 2014-09-19 DIAGNOSIS — M5135 Other intervertebral disc degeneration, thoracolumbar region: Secondary | ICD-10-CM | POA: Diagnosis not present

## 2014-09-19 DIAGNOSIS — I2510 Atherosclerotic heart disease of native coronary artery without angina pectoris: Secondary | ICD-10-CM

## 2014-09-19 DIAGNOSIS — Z889 Allergy status to unspecified drugs, medicaments and biological substances status: Secondary | ICD-10-CM | POA: Diagnosis not present

## 2014-09-19 DIAGNOSIS — K648 Other hemorrhoids: Secondary | ICD-10-CM | POA: Diagnosis not present

## 2014-09-19 DIAGNOSIS — Z86018 Personal history of other benign neoplasm: Secondary | ICD-10-CM | POA: Diagnosis not present

## 2014-09-19 DIAGNOSIS — I700 Atherosclerosis of aorta: Secondary | ICD-10-CM | POA: Insufficient documentation

## 2014-09-19 DIAGNOSIS — R0602 Shortness of breath: Secondary | ICD-10-CM | POA: Diagnosis not present

## 2014-09-19 DIAGNOSIS — R202 Paresthesia of skin: Secondary | ICD-10-CM | POA: Diagnosis not present

## 2014-09-19 DIAGNOSIS — K5790 Diverticulosis of intestine, part unspecified, without perforation or abscess without bleeding: Secondary | ICD-10-CM | POA: Insufficient documentation

## 2014-09-19 DIAGNOSIS — E669 Obesity, unspecified: Secondary | ICD-10-CM | POA: Diagnosis present

## 2014-09-19 DIAGNOSIS — R002 Palpitations: Secondary | ICD-10-CM | POA: Insufficient documentation

## 2014-09-19 DIAGNOSIS — I10 Essential (primary) hypertension: Secondary | ICD-10-CM | POA: Diagnosis not present

## 2014-09-19 DIAGNOSIS — J4520 Mild intermittent asthma, uncomplicated: Secondary | ICD-10-CM | POA: Diagnosis not present

## 2014-09-19 DIAGNOSIS — R0789 Other chest pain: Secondary | ICD-10-CM | POA: Diagnosis not present

## 2014-09-19 DIAGNOSIS — J45909 Unspecified asthma, uncomplicated: Secondary | ICD-10-CM | POA: Diagnosis not present

## 2014-09-19 DIAGNOSIS — Z79899 Other long term (current) drug therapy: Secondary | ICD-10-CM | POA: Diagnosis not present

## 2014-09-19 DIAGNOSIS — I252 Old myocardial infarction: Secondary | ICD-10-CM | POA: Insufficient documentation

## 2014-09-19 DIAGNOSIS — K432 Incisional hernia without obstruction or gangrene: Secondary | ICD-10-CM | POA: Diagnosis present

## 2014-09-19 DIAGNOSIS — Z7982 Long term (current) use of aspirin: Secondary | ICD-10-CM | POA: Insufficient documentation

## 2014-09-19 DIAGNOSIS — D500 Iron deficiency anemia secondary to blood loss (chronic): Secondary | ICD-10-CM | POA: Diagnosis not present

## 2014-09-19 DIAGNOSIS — Z87891 Personal history of nicotine dependence: Secondary | ICD-10-CM | POA: Insufficient documentation

## 2014-09-19 DIAGNOSIS — Z9861 Coronary angioplasty status: Secondary | ICD-10-CM | POA: Diagnosis not present

## 2014-09-19 DIAGNOSIS — E785 Hyperlipidemia, unspecified: Secondary | ICD-10-CM | POA: Diagnosis not present

## 2014-09-19 DIAGNOSIS — Z85038 Personal history of other malignant neoplasm of large intestine: Secondary | ICD-10-CM | POA: Insufficient documentation

## 2014-09-19 DIAGNOSIS — R079 Chest pain, unspecified: Principal | ICD-10-CM | POA: Insufficient documentation

## 2014-09-19 DIAGNOSIS — M1990 Unspecified osteoarthritis, unspecified site: Secondary | ICD-10-CM | POA: Insufficient documentation

## 2014-09-19 DIAGNOSIS — M810 Age-related osteoporosis without current pathological fracture: Secondary | ICD-10-CM | POA: Diagnosis not present

## 2014-09-19 LAB — I-STAT CHEM 8, ED
BUN: 21 mg/dL (ref 6–23)
CHLORIDE: 100 mmol/L (ref 96–112)
CREATININE: 1 mg/dL (ref 0.50–1.10)
Calcium, Ion: 1.21 mmol/L (ref 1.13–1.30)
GLUCOSE: 133 mg/dL — AB (ref 70–99)
HCT: 41 % (ref 36.0–46.0)
HEMOGLOBIN: 13.9 g/dL (ref 12.0–15.0)
Potassium: 3.4 mmol/L — ABNORMAL LOW (ref 3.5–5.1)
SODIUM: 140 mmol/L (ref 135–145)
TCO2: 25 mmol/L (ref 0–100)

## 2014-09-19 LAB — CBC WITH DIFFERENTIAL/PLATELET
BASOS PCT: 0 % (ref 0–1)
Basophils Absolute: 0 10*3/uL (ref 0.0–0.1)
Eosinophils Absolute: 0.2 10*3/uL (ref 0.0–0.7)
Eosinophils Relative: 2 % (ref 0–5)
HEMATOCRIT: 39.1 % (ref 36.0–46.0)
Hemoglobin: 13.1 g/dL (ref 12.0–15.0)
LYMPHS ABS: 1 10*3/uL (ref 0.7–4.0)
Lymphocytes Relative: 14 % (ref 12–46)
MCH: 31 pg (ref 26.0–34.0)
MCHC: 33.5 g/dL (ref 30.0–36.0)
MCV: 92.4 fL (ref 78.0–100.0)
MONOS PCT: 7 % (ref 3–12)
Monocytes Absolute: 0.5 10*3/uL (ref 0.1–1.0)
NEUTROS ABS: 5.7 10*3/uL (ref 1.7–7.7)
Neutrophils Relative %: 77 % (ref 43–77)
PLATELETS: 175 10*3/uL (ref 150–400)
RBC: 4.23 MIL/uL (ref 3.87–5.11)
RDW: 13.3 % (ref 11.5–15.5)
WBC: 7.4 10*3/uL (ref 4.0–10.5)

## 2014-09-19 LAB — TROPONIN I
TROPONIN I: 0.03 ng/mL (ref <0.031)
Troponin I: <0.03 ng/mL (ref <0.031)

## 2014-09-19 LAB — CREATININE, SERUM
Creatinine, Ser: 0.95 mg/dL (ref 0.50–1.10)
GFR calc Af Amer: 62 mL/min — ABNORMAL LOW (ref >90)
GFR calc non Af Amer: 53 mL/min — ABNORMAL LOW (ref >90)

## 2014-09-19 LAB — I-STAT TROPONIN, ED: TROPONIN I, POC: 0 ng/mL (ref 0.00–0.08)

## 2014-09-19 MED ORDER — SODIUM CHLORIDE 0.9 % IV SOLN: INTRAVENOUS | Status: DC

## 2014-09-19 MED ORDER — MORPHINE SULFATE 2 MG/ML IJ SOLN: 1.0000 mg | INTRAMUSCULAR | Status: DC | PRN

## 2014-09-19 MED ORDER — LORATADINE 10 MG PO TABS
10.0000 mg | ORAL_TABLET | Freq: Every day | ORAL | Status: DC
  Administered 2014-09-19 – 2014-09-20 (×2): 10 mg via ORAL
  Filled 2014-09-19 (×2): qty 1

## 2014-09-19 MED ORDER — LOVASTATIN ER 20 MG PO TB24: 20.0000 mg | ORAL_TABLET | Freq: Every day | ORAL | Status: DC

## 2014-09-19 MED ORDER — ONDANSETRON HCL 4 MG/2ML IJ SOLN: 4.0000 mg | Freq: Four times a day (QID) | INTRAMUSCULAR | Status: DC | PRN

## 2014-09-19 MED ORDER — ALPRAZOLAM 0.25 MG PO TABS: 0.2500 mg | ORAL_TABLET | Freq: Two times a day (BID) | ORAL | Status: DC | PRN

## 2014-09-19 MED ORDER — ENOXAPARIN SODIUM 40 MG/0.4ML ~~LOC~~ SOLN
40.0000 mg | SUBCUTANEOUS | Status: DC
  Administered 2014-09-19: 40 mg via SUBCUTANEOUS
  Filled 2014-09-19: qty 0.4

## 2014-09-19 MED ORDER — PRAVASTATIN SODIUM 20 MG PO TABS
20.0000 mg | ORAL_TABLET | Freq: Every day | ORAL | Status: DC
  Administered 2014-09-19: 20 mg via ORAL
  Filled 2014-09-19 (×2): qty 1

## 2014-09-19 MED ORDER — GI COCKTAIL ~~LOC~~
30.0000 mL | Freq: Four times a day (QID) | ORAL | Status: DC | PRN
Start: 2014-09-19 — End: 2014-09-20

## 2014-09-19 MED ORDER — CALCIUM CARBONATE-VITAMIN D 500-200 MG-UNIT PO TABS
1.0000 | ORAL_TABLET | Freq: Every day | ORAL | Status: DC
  Administered 2014-09-20: 1 via ORAL
  Filled 2014-09-19 (×2): qty 1

## 2014-09-19 MED ORDER — TRIAMTERENE-HCTZ 37.5-25 MG PO CAPS
1.0000 | ORAL_CAPSULE | Freq: Every day | ORAL | Status: DC
  Administered 2014-09-19 – 2014-09-20 (×2): 1 via ORAL
  Filled 2014-09-19 (×2): qty 1

## 2014-09-19 MED ORDER — ACETAMINOPHEN 325 MG PO TABS
650.0000 mg | ORAL_TABLET | ORAL | Status: DC | PRN
  Administered 2014-09-19 – 2014-09-20 (×2): 650 mg via ORAL
  Filled 2014-09-19 (×2): qty 2

## 2014-09-19 MED ORDER — TRAMADOL HCL 50 MG PO TABS: 50.0000 mg | ORAL_TABLET | Freq: Four times a day (QID) | ORAL | Status: DC | PRN

## 2014-09-19 MED ORDER — ASPIRIN 81 MG PO CHEW
324.0000 mg | CHEWABLE_TABLET | Freq: Once | ORAL | Status: AC
  Administered 2014-09-19: 324 mg via ORAL
  Filled 2014-09-19: qty 4

## 2014-09-19 MED ORDER — NITROGLYCERIN 0.4 MG SL SUBL
0.4000 mg | SUBLINGUAL_TABLET | SUBLINGUAL | Status: DC | PRN
  Administered 2014-09-19: 0.4 mg via SUBLINGUAL
  Filled 2014-09-19: qty 1

## 2014-09-19 MED ORDER — ASPIRIN EC 325 MG PO TBEC
325.0000 mg | DELAYED_RELEASE_TABLET | Freq: Every day | ORAL | Status: DC
  Administered 2014-09-20: 325 mg via ORAL
  Filled 2014-09-19: qty 1

## 2014-09-19 MED ORDER — CALCIUM CITRATE-VITAMIN D 315-200 MG-UNIT PO TABS: 1.0000 | ORAL_TABLET | Freq: Every day | ORAL | Status: DC

## 2014-09-19 MED ORDER — METOPROLOL TARTRATE 25 MG PO TABS
25.0000 mg | ORAL_TABLET | Freq: Two times a day (BID) | ORAL | Status: DC
  Administered 2014-09-19 – 2014-09-20 (×3): 25 mg via ORAL
  Filled 2014-09-19 (×3): qty 1

## 2014-09-19 NOTE — Consult Note (Signed)
Admit date: 09/19/2014 Referring Physician  Dr. Sloan Leiter Primary Physician  Dr. Felipa Eth Primary Cardiologist  Dr. Ellyn Hack Reason for Consultation  Palpitations and chest pain  HPI: [name] is a 81 y.o. female with a Past Medical History of CAD status post PCI in 2014, hypertension, dyslipidemia who presents today with the above noted complaint. Per patient, she has been in excellent health over the past few years, without any exertional chest pain or shortness of breath. Around 5 AM this morning, patient noted that she was having palpitations. At the same time she also started experiencing left chest discomfort. Patient describes this as mostly discomfort and not pain and more in the upper left chest and left shoulder. She describes that she also had some tingling/pain in her fingers of the left hand. She describes that the discomfort is across from the left shoulder to the left side of her chest. She has chronic left shoulder pain so she is not sure if this is just her shoulder pain or related to her heart. There was no associated nausea, vomiting, shortness of breath or diaphoresis. She presented to the emergency room with these complaints, initial EKG and cardiac enzymes were negative. The hospitalist service has asked for a cardiology consult.  She says that she is still having 4/10 chest discomfort in her upper chest and some palpitations but her monitor shows NSR.  She denies any DOE, SOB, diaphoresis or nausea.  Initial troponin is normal and EKG shows NSR with nonspecific ST abnormality.    PMH:   Past Medical History  Diagnosis Date  . CAD S/P percutaneous coronary angioplasty 2002, 2003,2014    a) Unstable Angina 7/'02: 1st Cutting PTCA- OM2 --> restenosed 9/'02 NSTEMI--> 3.0 mm x 12 mm BMS-OM 2; b) 11/'03 DOE w/ + Cardiolite - Cutter PTCA for ISR -- patent in 2004 (after False + Cardiolite); c) 07/2012 - Unstable Angina -- PCI to proximal RCA with Promus Premier DES 2.75 mm x 20  mm (3.0 mm), patent OM2 stent ~10% ISR (HARDING)  . Non-Q wave ST elevation myocardial infarction (STEMI) involving left circumflex coronary artery 03/2001    a) Severe thrombic ISR of prior PTCA site in OM2 --> BMS PCI; b) Echo 08/2012: Normal LV size & function.  EF 55-60%, no regional WMA, Gr 1 DD, mild MR, mild-mod TR - NO Pulmonary HTN  . Coronary stent restenosis due to scar tissue 05/2002    Cutting Balloon PTCA to OM2 BMS  . Medication intolerance - Plavix, Effient, Brilinta     Easy bruising, GI bleeds (led to Dx of Colon CA), Brilinta - dyspnea  . Hyperlipidemia with target LDL less than 70   . Essential hypertension   . Aortic sclerosis     With murmur  . Asthma   . Multiple lung nodules on CT 08/2012    scattered, bilateral but right>left.   . H/O Colon cancer 10/2011    GI - Drs. Carlean Purl - colonoscopy 10/2012: diverticulosis, stable ileocolic anastomosis of R colon  . Adenomatous colon polyp MAY 2013    HOSPITALIZED AT [facility] WITH HGB 7 -TRANSFUSED-MASS FOUND IN ASCENDING COLON  . Iron deficiency anemia due to chronic blood loss 10/2011    Blood Transfusion (EGD 09/2012 - unremarkable)  . Internal hemorrhoids   . Diverticulosis 10/2012    Seen on Colonoscopy  . Osteoarthritis (arthritis due to wear and tear of joints)     Bilateral Hip Arthroplasty, L Knee TKA  . DJD (degenerative  joint disease), thoracolumbar     Back, Hips & Knees  . Osteoporosis   . Complication of anesthesia 2009    HIP REPLACEMENT-PT HAD HARD TIME WAKING UP--FELT LIKE SHE COULDN'T BREATHE     PSH:   Past Surgical History  Procedure Laterality Date  . Appendectomy    . Cholecystectomy    . Coronary angioplasty with stent placement  2002; 2003; 07/2012    a)  7/'02: 1st Cutting PTCA- OM2 --> restenosed 9/'02 --> 3.0 mm x 12 mm BMS-OM 2; b) 11/'03 - Cutter PTCA for ISR -- patent in 2004; c) PCI to proximal RCA with Promus Premier DES 2.75 mm x 20 mm (3.0 mm), patent OM stent ~10% ISR  . Colonoscopy w/  biopsies and polypectomy  06/15/2008    adenomatous polyps, diverticulosis, internal hemorrhoids  . Total hip arthroplasty  09/08/2007    left, Dr. Wynelle Link  . Colonoscopy  11/16/2011    Procedure: COLONOSCOPY;  Surgeon: Gatha Mayer, MD;  Location: [place];  Service: Endoscopy;  Laterality: N/A;  . Replacement total knee Left 2003  . Total hip arthroplasty Right 2003  . Esophagogastroduodenoscopy N/A 10/23/2012    Procedure: ESOPHAGOGASTRODUODENOSCOPY (EGD);  Surgeon: Inda Castle, MD;  Location: [place];  Service: Endoscopy;  Laterality: N/A;  . Colonoscopy  10/2012    Gessner: diverticulosis, stable R colon ileocolic anastomosis  . Incisional hernia repair N/A 01/29/2013    Procedure: LAPAROSCOPIC INCISIONAL HERNIA;  Surgeon: Harl Bowie, MD;  Location: [facility];  Service: General;  Laterality: N/A;  . Insertion of mesh N/A 01/29/2013    Procedure: INSERTION OF MESH;  Surgeon: Harl Bowie, MD;  Location: [facility];  Service: General;  Laterality: N/A;  . Transthoracic echocardiogram  08/2012    Normal LV size & function.  EF 55-60%, no regional WMA, Gr 1 DD, mild MR, mild-mod TR; aortic sclerosis without stenosis  . Left heart catheterization with coronary angiogram N/A 07/15/2012    Procedure: LEFT HEART CATHETERIZATION WITH CORONARY ANGIOGRAM;  Surgeon: Leonie Man, MD;  Location: [facility] CATH LAB;  Service: Cardiovascular;  Laterality: N/A;  . Percutaneous coronary stent intervention (pci-s)  07/15/2012    Procedure: PERCUTANEOUS CORONARY STENT INTERVENTION (PCI-S);  Surgeon: Leonie Man, MD;  Location: [facility] CATH LAB;  Service: Cardiovascular;;    Allergies:  Atorvastatin; Brilinta; Clopidogrel bisulfate; Codeine; and Sulfonamide derivatives Prior to Admit Meds:   (Not in a hospital admission) Fam HX:    Family History  Problem Relation Age of Onset  . Pancreatic cancer Father   . Prostate cancer Father   . Pancreatic cancer Brother   . Heart disease Mother   .  Heart disease Sister   . Colon cancer Brother     ? Colostomy  . Prostate cancer Brother   . Colon cancer Sister    Social HX:    History   Social History  . Marital Status: Married    Spouse Name: N/A  . Number of Children: 2  . Years of Education: N/A   Occupational History  . retired    Social History Main Topics  . Smoking status: Former Smoker -- 30 years    Types: Cigarettes    Quit date: 07/02/1982  . Smokeless tobacco: Never Used  . Alcohol Use: Yes     Comment: occ wine  . Drug Use: No  . Sexual Activity: Not on file   Other Topics Concern  . Not on file   Social History  Narrative   Married, Mother of 2 adopted children. She has 3 grandchildren.   She works as a Psychologist, occupational at [employer].   She quit smoking in 1984, and does not drink alcohol.     ROS:  All 11 ROS were addressed and are negative except what is stated in the HPI  Physical Exam: Blood pressure 116/60, pulse 79, temperature 97.8 F (36.6 C), temperature source Oral, resp. rate 11, height 5' (1.524 m), weight 160 lb (72.576 kg), SpO2 97 %.    General: Well developed, well nourished, in no acute distress Head: Eyes PERRLA, No xanthomas.   Normal cephalic and atramatic  Lungs:   Clear bilaterally to auscultation and percussion. Heart:   HRRR S1 S2 Pulses are 2+ & equal.            No carotid bruit. No JVD.  No abdominal bruits. No femoral bruits. Abdomen: Bowel sounds are positive, abdomen soft and non-tender without masses Extremities:   No clubbing, cyanosis or edema.  DP +1 Neuro: Alert and oriented X 3. Psych:  Good affect, responds appropriately    Labs:   Lab Results  Component Value Date   WBC 7.4 09/19/2014   HGB 13.9 09/19/2014   HCT 41.0 09/19/2014   MCV 92.4 09/19/2014   PLT 175 09/19/2014    Recent Labs Lab 09/12/14 2329  09/19/14 0957  NA 135  --  140  K 4.0  --  3.4*  CL 101  --  100  CO2 28  --   --   BUN 23  --  21  CREATININE 1.13*  < > 1.00    CALCIUM 9.6  --   --   PROT 7.3  --   --   BILITOT 1.3*  --   --   ALKPHOS 63  --   --   ALT 24  --   --   AST 30  --   --   GLUCOSE 157*  --  133*  < > = values in this interval not displayed. No results found for: PTT Lab Results  Component Value Date   INR 1.06 07/14/2012   INR 1.04 11/14/2011   INR 2.4* 09/07/2007   Lab Results  Component Value Date   CKTOTAL 82 11/14/2011   CKMB 2.5 11/14/2011   TROPONINI 0.03 09/19/2014     Lab Results  Component Value Date   CHOL 122 10/23/2012   CHOL 158 07/15/2012   Lab Results  Component Value Date   HDL 67 10/23/2012   HDL 90 07/15/2012   Lab Results  Component Value Date   LDLCALC 32 10/23/2012   LDLCALC 48 07/15/2012   Lab Results  Component Value Date   TRIG 116 10/23/2012   TRIG 99 07/15/2012   Lab Results  Component Value Date   CHOLHDL 1.8 10/23/2012   CHOLHDL 1.8 07/15/2012   No results found for: LDLDIRECT    Radiology:  Dg Chest 2 View  09/19/2014   CLINICAL DATA:  Upper left chest pain radiating into the left on  EXAM: CHEST  2 VIEW  COMPARISON:  08/23/2014  FINDINGS: Cardiac shadow is within normal limits. The lungs are well aerated bilaterally. No focal infiltrate or effusion is seen. No acute bony abnormality is noted. Degenerative changes of the right shoulder joint are again seen.  IMPRESSION: No acute abnormality noted.   Electronically Signed   By: Inez Catalina M.D.   On: 09/19/2014 09:25    EKG:  NSR with nonspecific ST abnormality  ASSESSMENT/PLAN:  1.  Chest pain with typical and atypical components.  She has chronic shoulder pain and she cannot decipher if pain is due to shoulder or her heart.  Her more prominent complaint was an uneasiness in her chest that she described as a pounding but her heart rhythm has been NSR.  Initial trop is normal and EKG is nonischemic.  Continue to cycle cardiac enzymes.  Continue ASA/BB/statin.  Would not use full dose anticoagulation unless enzymes become  positive.  If she rules out consider outpt stress test.    2.  ASCAD with 7/'02: 1st Cutting PTCA- OM2 --> restenosed 9/'02 NSTEMI--> 3.0 mm x 12 mm BMS-OM 2; b) 11/'03 DOE w/ + Cardiolite - Cutter PTCA for ISR -- patent in 2004 (after False + Cardiolite); c) 07/2012 - Unstable Angina -- PCI to proximal RCA with Promus Premier DES 2.75 mm x 20 mm (3.0 mm), patent OM2 stent ~10% ISR (HARDING)  3.  Palpitations with NSR on monitor - will watch on tele for arrhythmias.    4.  HTN - controlled.  Continue BB/diuretic  5.  Dyslipidemia - continue statin     Sueanne Margarita, MD  09/19/2014  2:46 PM

## 2014-09-19 NOTE — ED Notes (Signed)
Pt states NTG "made me feel worse" does not want another one-- normally takes tylenol for pain in am, did not take any this am.

## 2014-09-19 NOTE — ED Notes (Signed)
Pt. Stated, I could feel my heart pulsing real fast. Left arm pain

## 2014-09-19 NOTE — ED Provider Notes (Signed)
CSN: [id number]     Arrival date & time 09/19/14  0825 History   First MD Initiated Contact with Patient 09/19/14 [phone]     Chief Complaint  Patient presents with  . Palpitations  . Tingling    fingertips  . Chest Pain  . Shortness of Breath     HPI Pt was seen at 0855.  Per pt, c/o gradual onset and persistence of constant "palpitations" and "fingertips tingling" that began at 0500 this morning PTA. Pt describes the palpitations as "my heart is beating fast." States she took her BP and the machine read her HR as "93." Pt states at 0700 she developed left sided chest "pressure" and "heaviness" which radiates into her left arm. Has been associated with SOB. Pt states she tried to take her own SL ntg "but it just melted in my mouth."  Denies cough, no abd pain, no N/V/D, no back pain, no fevers.    Past Medical History  Diagnosis Date  . CAD S/P percutaneous coronary angioplasty 2002, 2003,2014    a) Unstable Angina 7/'02: 1st Cutting PTCA- OM2 --> restenosed 9/'02 NSTEMI--> 3.0 mm x 12 mm BMS-OM 2; b) 11/'03 DOE w/ + Cardiolite - Cutter PTCA for ISR -- patent in 2004 (after False + Cardiolite); c) 07/2012 - Unstable Angina -- PCI to proximal RCA with Promus Premier DES 2.75 mm x 20 mm (3.0 mm), patent OM2 stent ~10% ISR (HARDING)  . Non-Q wave ST elevation myocardial infarction (STEMI) involving left circumflex coronary artery 03/2001    a) Severe thrombic ISR of prior PTCA site in OM2 --> BMS PCI; b) Echo 08/2012: Normal LV size & function.  EF 55-60%, no regional WMA, Gr 1 DD, mild MR, mild-mod TR - NO Pulmonary HTN  . Coronary stent restenosis due to scar tissue 05/2002    Cutting Balloon PTCA to OM2 BMS  . Medication intolerance - Plavix, Effient, Brilinta     Easy bruising, GI bleeds (led to Dx of Colon CA), Brilinta - dyspnea  . Hyperlipidemia with target LDL less than 70   . Essential hypertension   . Aortic sclerosis     With murmur  . Asthma   . Multiple lung nodules on CT 08/2012     scattered, bilateral but right>left.   . H/O Colon cancer 10/2011    GI - Drs. Carlean Purl - colonoscopy 10/2012: diverticulosis, stable ileocolic anastomosis of R colon  . Adenomatous colon polyp MAY 2013    HOSPITALIZED AT [facility] WITH HGB 7 -TRANSFUSED-MASS FOUND IN ASCENDING COLON  . Iron deficiency anemia due to chronic blood loss 10/2011    Blood Transfusion (EGD 09/2012 - unremarkable)  . Internal hemorrhoids   . Diverticulosis 10/2012    Seen on Colonoscopy  . Osteoarthritis (arthritis due to wear and tear of joints)     Bilateral Hip Arthroplasty, L Knee TKA  . DJD (degenerative joint disease), thoracolumbar     Back, Hips & Knees  . Osteoporosis   . Complication of anesthesia 2009    HIP REPLACEMENT-PT HAD HARD TIME WAKING UP--FELT LIKE SHE COULDN'T BREATHE   Past Surgical History  Procedure Laterality Date  . Appendectomy    . Cholecystectomy    . Coronary angioplasty with stent placement  2002; 2003; 07/2012    a)  7/'02: 1st Cutting PTCA- OM2 --> restenosed 9/'02 --> 3.0 mm x 12 mm BMS-OM 2; b) 11/'03 - Cutter PTCA for ISR -- patent in 2004; c) PCI to proximal RCA with Promus  Premier DES 2.75 mm x 20 mm (3.0 mm), patent OM stent ~10% ISR  . Colonoscopy w/ biopsies and polypectomy  06/15/2008    adenomatous polyps, diverticulosis, internal hemorrhoids  . Total hip arthroplasty  09/08/2007    left, Dr. Wynelle Link  . Colonoscopy  11/16/2011    Procedure: COLONOSCOPY;  Surgeon: Gatha Mayer, MD;  Location: [place];  Service: Endoscopy;  Laterality: N/A;  . Replacement total knee Left 2003  . Total hip arthroplasty Right 2003  . Esophagogastroduodenoscopy N/A 10/23/2012    Procedure: ESOPHAGOGASTRODUODENOSCOPY (EGD);  Surgeon: Inda Castle, MD;  Location: [place];  Service: Endoscopy;  Laterality: N/A;  . Colonoscopy  10/2012    Gessner: diverticulosis, stable R colon ileocolic anastomosis  . Incisional hernia repair N/A 01/29/2013    Procedure: LAPAROSCOPIC INCISIONAL  HERNIA;  Surgeon: Harl Bowie, MD;  Location: [facility];  Service: General;  Laterality: N/A;  . Insertion of mesh N/A 01/29/2013    Procedure: INSERTION OF MESH;  Surgeon: Harl Bowie, MD;  Location: [facility];  Service: General;  Laterality: N/A;  . Transthoracic echocardiogram  08/2012    Normal LV size & function.  EF 55-60%, no regional WMA, Gr 1 DD, mild MR, mild-mod TR; aortic sclerosis without stenosis  . Left heart catheterization with coronary angiogram N/A 07/15/2012    Procedure: LEFT HEART CATHETERIZATION WITH CORONARY ANGIOGRAM;  Surgeon: Leonie Man, MD;  Location: [facility] CATH LAB;  Service: Cardiovascular;  Laterality: N/A;  . Percutaneous coronary stent intervention (pci-s)  07/15/2012    Procedure: PERCUTANEOUS CORONARY STENT INTERVENTION (PCI-S);  Surgeon: Leonie Man, MD;  Location: [facility] CATH LAB;  Service: Cardiovascular;;   Family History  Problem Relation Age of Onset  . Pancreatic cancer Father   . Prostate cancer Father   . Pancreatic cancer Brother   . Heart disease Mother   . Heart disease Sister   . Colon cancer Brother     ? Colostomy  . Prostate cancer Brother   . Colon cancer Sister    History  Substance Use Topics  . Smoking status: Former Smoker -- 30 years    Types: Cigarettes    Quit date: 07/02/1982  . Smokeless tobacco: Never Used  . Alcohol Use: Yes     Comment: occ wine    Review of Systems ROS: Statement: All systems negative except as marked or noted in the HPI; Constitutional: Negative for fever and chills. ; ; Eyes: Negative for eye pain, redness and discharge. ; ; ENMT: Negative for ear pain, hoarseness, nasal congestion, sinus pressure and sore throat. ; ; Cardiovascular: +CP, SOB, palpitations. Negative for diaphoresis, and peripheral edema. ; ; Respiratory: Negative for cough, wheezing and stridor. ; ; Gastrointestinal: Negative for nausea, vomiting, diarrhea, abdominal pain, blood in stool, hematemesis, jaundice and rectal  bleeding. . ; ; Genitourinary: Negative for dysuria, flank pain and hematuria. ; ; Musculoskeletal: Negative for back pain and neck pain. Negative for swelling and trauma.; ; Skin: Negative for pruritus, rash, abrasions, blisters, bruising and skin lesion.; ; Neuro: +paresthesias. Negative for headache, lightheadedness and neck stiffness. Negative for weakness, altered level of consciousness , altered mental status, extremity weakness, involuntary movement, seizure and syncope.      Allergies  Atorvastatin; Brilinta; Clopidogrel bisulfate; Codeine; and Sulfonamide derivatives  Home Medications   Prior to Admission medications   Medication Sig Start Date End Date Taking? Authorizing Provider  acetaminophen (TYLENOL) 325 MG tablet Take 650 mg by mouth every 6 (six)  hours as needed. For pain    Historical Provider, MD  albuterol (PROVENTIL HFA;VENTOLIN HFA) 108 (90 BASE) MCG/ACT inhaler Inhale 2 puffs into the lungs every 6 (six) hours as needed for wheezing.    Historical Provider, MD  aspirin 162 MG EC tablet Take 81 mg by mouth daily.     Historical Provider, MD  Biotin 5000 MCG CAPS Take 5,000 mcg by mouth daily.    Historical Provider, MD  calcium citrate-vitamin D (CITRACAL+D) 315-200 MG-UNIT per tablet Take 1 tablet by mouth daily.    Historical Provider, MD  cetirizine (ZYRTEC) 10 MG tablet Take 5 mg by mouth 2 (two) times daily.     Historical Provider, MD  lovastatin (ALTOPREV) 20 MG 24 hr tablet Take 20 mg by mouth at bedtime.    Historical Provider, MD  metoprolol (LOPRESSOR) 50 MG tablet Take 25 mg by mouth 2 (two) times daily.     Historical Provider, MD  traMADol (ULTRAM) 50 MG tablet Take 50 mg by mouth every 6 (six) hours as needed for pain.  11/10/12   Historical Provider, MD  triamterene-hydrochlorothiazide (DYAZIDE) 37.5-25 MG per capsule Take 1 capsule by mouth every morning.    Historical Provider, MD   BP 155/69 mmHg  Pulse 92  Temp(Src) 97.8 F (36.6 C) (Oral)  Resp 17   Ht 5' (1.524 m)  Wt 160 lb (72.576 kg)  BMI 31.25 kg/m2  SpO2 97% Physical Exam  0900; Physical examination:  Nursing notes reviewed; Vital signs and O2 SAT reviewed;  Constitutional: Well developed, Well nourished, Well hydrated, In no acute distress; Head:  Normocephalic, atraumatic; Eyes: EOMI, PERRL, No scleral icterus; ENMT: Mouth and pharynx normal, Mucous membranes moist; Neck: Supple, Full range of motion, No lymphadenopathy; Cardiovascular: Regular rate and rhythm, No gallop; Respiratory: Breath sounds clear & equal bilaterally, No wheezes.  Speaking full sentences with ease, Normal respiratory effort/excursion; Chest: Nontender, Movement normal; Abdomen: Soft, Nontender, Nondistended, Normal bowel sounds; Genitourinary: No CVA tenderness; Extremities: Pulses normal, No tenderness, No edema, No calf edema or asymmetry.; Neuro: AA&Ox3, Major CN grossly intact.  Speech clear. No gross focal motor or sensory deficits in extremities.; Skin: Color normal, Warm, Dry.   ED Course  Procedures     EKG Interpretation   Date/Time:  Sunday September 19 2014 08:30:06 EDT Ventricular Rate:  95 PR Interval:  138 QRS Duration: 68 QT Interval:  330 QTC Calculation: 414 R Axis:   40 Text Interpretation:  Normal sinus rhythm Low voltage QRS Nonspecific ST  abnormality Abnormal ECG When compared with ECG of 01/01/2001 No significant  change was found Confirmed by [facility]  MD, Nunzio Cory [phone]) on 09/19/2014  8:40:49 AM      MDM  MDM Reviewed: previous chart, nursing note and vitals Reviewed previous: labs and ECG Interpretation: labs, ECG and x-ray     Results for orders placed or performed during the hospital encounter of 09/19/14  I-stat Chem 8, ED  Result Value Ref Range   Sodium 140 135 - 145 mmol/L   Potassium 3.4 (L) 3.5 - 5.1 mmol/L   Chloride 100 96 - 112 mmol/L   BUN 21 6 - 23 mg/dL   Creatinine, Ser 1.00 0.50 - 1.10 mg/dL   Glucose, Bld 133 (H) 70 - 99 mg/dL   Calcium, Ion 1.21  1.13 - 1.30 mmol/L   TCO2 25 0 - 100 mmol/L   Hemoglobin 13.9 12.0 - 15.0 g/dL   HCT 41.0 36.0 - 46.0 %  I-stat troponin, ED  Result Value Ref Range   Troponin i, poc 0.00 0.00 - 0.08 ng/mL   Comment 3           Dg Chest 2 View 09/19/2014   CLINICAL DATA:  Upper left chest pain radiating into the left on  EXAM: CHEST  2 VIEW  COMPARISON:  08/23/2014  FINDINGS: Cardiac shadow is within normal limits. The lungs are well aerated bilaterally. No focal infiltrate or effusion is seen. No acute bony abnormality is noted. Degenerative changes of the right shoulder joint are again seen.  IMPRESSION: No acute abnormality noted.   Electronically Signed   By: Inez Catalina M.D.   On: 09/19/2014 09:25    1005:  ASA and SL ntg given. Pt with significant cardiac hx and concerning HPI; will admit.  T/C to [facility] Dr. Sloan Leiter, case discussed, including:  HPI, pertinent PM/SHx, VS/PE, dx testing, ED course and treatment:  Agreeable to admit, requests to write temporary orders, obtain observation tele bed to team [facility]Admits.   Francine Graven, DO 09/22/14 1407

## 2014-09-19 NOTE — H&P (Signed)
PATIENT DETAILS Name: [name] Age: 81 y.o. Sex: female Date of Birth: 01-29-1928 Admit Date: 09/19/2014 OMV:[id number],GGE THOMAS, MD   CHIEF COMPLAINT:  Palpitations and chest discomfort since 5 AM this morning  HPI: [name] is a 81 y.o. female with a Past Medical History o CAD status post PCI in 2014, hypertension, dyslipidemia who presents today with the above noted complaint. Per patient, she has been in excellent health over the past few years, without any exertional chest pain or shortness of breath. Around 5 AM this morning, patient noted that she was having palpitations. At the same time she also started experiencing left chest discomfort. Patient describes this as mostly discomfort and not pain. She describes that she also had some tingling/pain in her fingers of the left hand. She describes that the discomfort is across from the left shoulder to the left side of her chest. There was no associated nausea, vomiting, shortness of breath or diaphoresis. She presented to the emergency room with these complaints, initial EKG and cardiac enzymes were negative. The hospitalist service was asked to admit this patient for further evaluation and treatment Currently denies any fever, headache, abdominal pain, nausea, vomiting or diarrhea.   ALLERGIES:   Allergies  Allergen Reactions  . Atorvastatin Shortness Of Breath  . Brilinta [Ticagrelor] Shortness Of Breath    BLEEDING  . Clopidogrel Bisulfate Shortness Of Breath    BRUISING  . Codeine Nausea And Vomiting  . Sulfonamide Derivatives Other (See Comments)    Intestinal upset    PAST MEDICAL HISTORY: Past Medical History  Diagnosis Date  . CAD S/P percutaneous coronary angioplasty 2002, 2003,2014    a) Unstable Angina 7/'02: 1st Cutting PTCA- OM2 --> restenosed 9/'02 NSTEMI--> 3.0 mm x 12 mm BMS-OM 2; b) 11/'03 DOE w/ + Cardiolite - Cutter PTCA for ISR -- patent in 2004 (after False + Cardiolite); c) 07/2012 -  Unstable Angina -- PCI to proximal RCA with Promus Premier DES 2.75 mm x 20 mm (3.0 mm), patent OM2 stent ~10% ISR (HARDING)  . Non-Q wave ST elevation myocardial infarction (STEMI) involving left circumflex coronary artery 03/2001    a) Severe thrombic ISR of prior PTCA site in OM2 --> BMS PCI; b) Echo 08/2012: Normal LV size & function.  EF 55-60%, no regional WMA, Gr 1 DD, mild MR, mild-mod TR - NO Pulmonary HTN  . Coronary stent restenosis due to scar tissue 05/2002    Cutting Balloon PTCA to OM2 BMS  . Medication intolerance - Plavix, Effient, Brilinta     Easy bruising, GI bleeds (led to Dx of Colon CA), Brilinta - dyspnea  . Hyperlipidemia with target LDL less than 70   . Essential hypertension   . Aortic sclerosis     With murmur  . Asthma   . Multiple lung nodules on CT 08/2012    scattered, bilateral but right>left.   . H/O Colon cancer 10/2011    GI - Drs. Carlean Purl - colonoscopy 10/2012: diverticulosis, stable ileocolic anastomosis of R colon  . Adenomatous colon polyp MAY 2013    HOSPITALIZED AT [facility] WITH HGB 7 -TRANSFUSED-MASS FOUND IN ASCENDING COLON  . Iron deficiency anemia due to chronic blood loss 10/2011    Blood Transfusion (EGD 09/2012 - unremarkable)  . Internal hemorrhoids   . Diverticulosis 10/2012    Seen on Colonoscopy  . Osteoarthritis (arthritis due to wear and tear of joints)     Bilateral Hip Arthroplasty, L  Knee TKA  . DJD (degenerative joint disease), thoracolumbar     Back, Hips & Knees  . Osteoporosis   . Complication of anesthesia 2009    HIP REPLACEMENT-PT HAD HARD TIME WAKING UP--FELT LIKE SHE COULDN'T BREATHE    PAST SURGICAL HISTORY: Past Surgical History  Procedure Laterality Date  . Appendectomy    . Cholecystectomy    . Coronary angioplasty with stent placement  2002; 2003; 07/2012    a)  7/'02: 1st Cutting PTCA- OM2 --> restenosed 9/'02 --> 3.0 mm x 12 mm BMS-OM 2; b) 11/'03 - Cutter PTCA for ISR -- patent in 2004; c) PCI to proximal RCA with  Promus Premier DES 2.75 mm x 20 mm (3.0 mm), patent OM stent ~10% ISR  . Colonoscopy w/ biopsies and polypectomy  06/15/2008    adenomatous polyps, diverticulosis, internal hemorrhoids  . Total hip arthroplasty  09/08/2007    left, Dr. Wynelle Link  . Colonoscopy  11/16/2011    Procedure: COLONOSCOPY;  Surgeon: Gatha Mayer, MD;  Location: [place];  Service: Endoscopy;  Laterality: N/A;  . Replacement total knee Left 2003  . Total hip arthroplasty Right 2003  . Esophagogastroduodenoscopy N/A 10/23/2012    Procedure: ESOPHAGOGASTRODUODENOSCOPY (EGD);  Surgeon: Inda Castle, MD;  Location: [place];  Service: Endoscopy;  Laterality: N/A;  . Colonoscopy  10/2012    Gessner: diverticulosis, stable R colon ileocolic anastomosis  . Incisional hernia repair N/A 01/29/2013    Procedure: LAPAROSCOPIC INCISIONAL HERNIA;  Surgeon: Harl Bowie, MD;  Location: [facility];  Service: General;  Laterality: N/A;  . Insertion of mesh N/A 01/29/2013    Procedure: INSERTION OF MESH;  Surgeon: Harl Bowie, MD;  Location: [facility];  Service: General;  Laterality: N/A;  . Transthoracic echocardiogram  08/2012    Normal LV size & function.  EF 55-60%, no regional WMA, Gr 1 DD, mild MR, mild-mod TR; aortic sclerosis without stenosis  . Left heart catheterization with coronary angiogram N/A 07/15/2012    Procedure: LEFT HEART CATHETERIZATION WITH CORONARY ANGIOGRAM;  Surgeon: Leonie Man, MD;  Location: [facility] CATH LAB;  Service: Cardiovascular;  Laterality: N/A;  . Percutaneous coronary stent intervention (pci-s)  07/15/2012    Procedure: PERCUTANEOUS CORONARY STENT INTERVENTION (PCI-S);  Surgeon: Leonie Man, MD;  Location: [facility] CATH LAB;  Service: Cardiovascular;;    MEDICATIONS AT HOME: Prior to Admission medications   Medication Sig Start Date End Date Taking? Authorizing Provider  acetaminophen (TYLENOL) 325 MG tablet Take 650 mg by mouth every 6 (six) hours as needed. For pain    Historical  Provider, MD  albuterol (PROVENTIL HFA;VENTOLIN HFA) 108 (90 BASE) MCG/ACT inhaler Inhale 2 puffs into the lungs every 6 (six) hours as needed for wheezing.    Historical Provider, MD  aspirin 162 MG EC tablet Take 81 mg by mouth daily.     Historical Provider, MD  Biotin 5000 MCG CAPS Take 5,000 mcg by mouth daily.    Historical Provider, MD  calcium citrate-vitamin D (CITRACAL+D) 315-200 MG-UNIT per tablet Take 1 tablet by mouth daily.    Historical Provider, MD  cetirizine (ZYRTEC) 10 MG tablet Take 5 mg by mouth 2 (two) times daily.     Historical Provider, MD  lovastatin (ALTOPREV) 20 MG 24 hr tablet Take 20 mg by mouth at bedtime.    Historical Provider, MD  metoprolol (LOPRESSOR) 50 MG tablet Take 25 mg by mouth 2 (two) times daily.     Historical Provider, MD  traMADol (ULTRAM) 50 MG tablet Take 50 mg by mouth every 6 (six) hours as needed for pain.  11/10/12   Historical Provider, MD  triamterene-hydrochlorothiazide (DYAZIDE) 37.5-25 MG per capsule Take 1 capsule by mouth every morning.    Historical Provider, MD    FAMILY HISTORY: Family History  Problem Relation Age of Onset  . Pancreatic cancer Father   . Prostate cancer Father   . Pancreatic cancer Brother   . Heart disease Mother   . Heart disease Sister   . Colon cancer Brother     ? Colostomy  . Prostate cancer Brother   . Colon cancer Sister     SOCIAL HISTORY:  reports that she quit smoking about 32 years ago. Her smoking use included Cigarettes. She quit after 30 years of use. She has never used smokeless tobacco. She reports that she drinks alcohol. She reports that she does not use illicit drugs.  REVIEW OF SYSTEMS:  Constitutional:   No  weight loss, night sweats,  Fevers, chills, fatigue.  HEENT:    No headaches, Difficulty swallowing,Tooth/dental problems,Sore throat  Cardio-vascular: No  Orthopnea, PND, swelling in lower extremities, anasarca, dizziness, palpitations  GI:  No heartburn, indigestion,  abdominal pain, nausea, vomiting, diarrhea, change in  bowel habits, loss of appetite  Resp: No shortness of breath with exertion or at rest.  No excess mucus, no productive cough, No non-productive cough,  No coughing up of blood.No change in color of mucus.No wheezing.No chest wall deformity  Skin:  no rash or lesions.  GU:  no dysuria, change in color of urine, no urgency or frequency.  No flank pain.  Musculoskeletal: No joint pain or swelling.  No decreased range of motion.  No back pain.  Psych: No change in mood or affect. No depression or anxiety.  No memory loss.   PHYSICAL EXAM: Blood pressure 155/69, pulse 92, temperature 97.8 F (36.6 C), temperature source Oral, resp. rate 17, height 5' (1.524 m), weight 72.576 kg (160 lb), SpO2 97 %.  General appearance :Awake, alert, not in any distress. Speech Clear. Not toxic Looking HEENT: Atraumatic and Normocephalic, pupils equally reactive to light and accomodation Neck: supple, no JVD. No cervical lymphadenopathy.  Chest:Good air entry bilaterally, no added sounds  CVS: S1 S2 regular, no murmurs.  Abdomen: Bowel sounds present, Non tender and not distended with no gaurding, rigidity or rebound. Extremities: B/L Lower Ext shows no edema, both legs are warm to touch Neurology: Awake alert, and oriented X 3, CN II-XII intact, Non focal Skin:No Rash Wounds:N/A  LABS ON ADMISSION:   Recent Labs  09/19/14 0957  NA 140  K 3.4*  CL 100  GLUCOSE 133*  BUN 21  CREATININE 1.00   No results for input(s): AST, ALT, ALKPHOS, BILITOT, PROT, ALBUMIN in the last 72 hours. No results for input(s): LIPASE, AMYLASE in the last 72 hours.  Recent Labs  09/19/14 0946 09/19/14 0957  WBC 7.4  --   NEUTROABS 5.7  --   HGB 13.1 13.9  HCT 39.1 41.0  MCV 92.4  --   PLT 175  --    No results for input(s): CKTOTAL, CKMB, CKMBINDEX, TROPONINI in the last 72 hours. No results for input(s): DDIMER in the last 72 hours. Invalid  input(s): POCBNP   RADIOLOGIC STUDIES ON ADMISSION: Dg Chest 2 View  09/19/2014   CLINICAL DATA:  Upper left chest pain radiating into the left on  EXAM: CHEST  2 VIEW  COMPARISON:  08/23/2014  FINDINGS: Cardiac shadow is within normal limits. The lungs are well aerated bilaterally. No focal infiltrate or effusion is seen. No acute bony abnormality is noted. Degenerative changes of the right shoulder joint are again seen.  IMPRESSION: No acute abnormality noted.   Electronically Signed   By: Inez Catalina M.D.   On: 09/19/2014 09:25     EKG: Independently reviewed. NSR  ASSESSMENT AND PLAN: Present on Admission:  . Chest pain: 81 year old female with known history of CAD-requiring PCI in the past-resenting with palpitations and left sided chest pain. Pain has both typical and atypical features. EKG and cardiac enzymes so far negative. Patient will be admitted to a telemetry unit, cardiac enzymes will be cycled, 2-D echocardiogram will be obtained. We will continue aspirin, beta blockers and statins. Cardiology will be consulted.  . Asthma: Continue with as needed bronchodilators. Currently this appears stable. Lungs are completely clear on exam   . Essential hypertension: Continue with beta blocker and diuretic regimen. Follow and adjust accordingly   .Dyslipidemia: Continue statin  . Obesity (BMI 30-39.9): Counseled importance of weight loss  Further plan will depend as patient's clinical course evolves and further radiologic and laboratory data become available. Patient will be monitored closely.  Above noted plan was discussed with patient/spouse, they were in agreement.   DVT Prophylaxis: Prophylactic Lovenox  Code Status: Full Code  Disposition Plan:Home in 1-2 days    Total time spent for admission equals 45 minutes.  [facility] Pager [phone]  If 7PM-7AM, please contact night-coverage [email or web address] Password [email or web address] 09/19/2014, 10:30  AM

## 2014-09-19 NOTE — ED Notes (Signed)
DR TURNER AT BEDSIDE

## 2014-09-20 ENCOUNTER — Other Ambulatory Visit: Payer: Self-pay | Admitting: Physician Assistant

## 2014-09-20 DIAGNOSIS — K432 Incisional hernia without obstruction or gangrene: Secondary | ICD-10-CM

## 2014-09-20 DIAGNOSIS — R079 Chest pain, unspecified: Secondary | ICD-10-CM | POA: Diagnosis not present

## 2014-09-20 DIAGNOSIS — R072 Precordial pain: Secondary | ICD-10-CM | POA: Diagnosis not present

## 2014-09-20 DIAGNOSIS — J4520 Mild intermittent asthma, uncomplicated: Secondary | ICD-10-CM | POA: Diagnosis not present

## 2014-09-20 DIAGNOSIS — E669 Obesity, unspecified: Secondary | ICD-10-CM

## 2014-09-20 DIAGNOSIS — I10 Essential (primary) hypertension: Secondary | ICD-10-CM | POA: Diagnosis not present

## 2014-09-20 NOTE — Progress Notes (Signed)
    Subjective:  Feels well this morning. Eager to go home. No recurrence of chest pain. No shortness of breath.  Objective:  Vital Signs in the last 24 hours: Temp:  [98.2 F (36.8 C)-98.7 F (37.1 C)] 98.7 F (37.1 C) (03/21 0534) Pulse Rate:  [72-91] 78 (03/21 0534) Resp:  [9-18] 15 (03/21 0534) BP: (116-136)/(48-61) 117/50 mmHg (03/21 0534) SpO2:  [95 %-99 %] 96 % (03/21 0534) Weight:  [159 lb 6.4 oz (72.303 kg)] 159 lb 6.4 oz (72.303 kg) (03/20 1654)  Intake/Output from previous day:    Physical Exam: Pt is alert and oriented, pleasant elderly woman in NAD HEENT: normal Neck: JVP - normal Lungs: CTA bilaterally CV: RRR without murmur or gallop Abd: soft, NT, Positive BS, no hepatomegaly Ext: no C/C/E, distal pulses intact and equal Skin: warm/dry no rash  Lab Results:  Recent Labs  09/19/14 0946 09/19/14 0957  WBC 7.4  --   HGB 13.1 13.9  PLT 175  --     Recent Labs  09/19/14 0946 09/19/14 0957  NA  --  140  K  --  3.4*  CL  --  100  GLUCOSE  --  133*  BUN  --  21  CREATININE 0.95 1.00    Recent Labs  09/19/14 1519 09/19/14 1905  TROPONINI <0.03 <0.03   Tele: Personally reviewed: Sinus rhythm without significant arrhythmia  Assessment/Plan:  1. Atypical chest pain 2. Coronary artery disease, native vessel, with multiple PCI procedures 3. Heart palpitations 4. Essential hypertension 5. Dyslipidemia  The patient has minor nonspecific ST change on EKG. Cardiac markers are negative. Her chest pain symptoms were atypical. In fact, she has chronic bilateral shoulder pain but came to the hospital with shoulder/chest pain as well as palpitations. She's been in sinus rhythm since arrival. Considering her extensive history of CAD, would favor an outpatient pharmacologic nuclear scan to rule out significant ischemia. If palpitations recur, outpatient monitoring could be considered. I think she is stable for discharge this morning. Close follow-up will be  arranged with Dr. Allison Quarry PA/NP after her stress test.   Sherren Mocha, M.D. 09/20/2014, 10:15 AM

## 2014-09-20 NOTE — Progress Notes (Signed)
UR completed 

## 2014-09-20 NOTE — Discharge Summary (Signed)
Physician Discharge Summary  [name] SEG:[id number] DOB: 12-Apr-1928 DOA: 09/19/2014  PCP: Mathews Argyle, MD  Admit date: 09/19/2014 Discharge date: 09/20/2014  Time spent: 40 minutes  Recommendations for Outpatient Follow-up:  1. Follow-up with cardiology as outpatient for possible pharmacological stress scan.   Discharge Diagnoses:  Principal Problem:   Chest pain Active Problems:   Asthma   Incisional hernia, without obstruction or gangrene   CAD S/P PCI: OM2 PTCA 7/02, OM2 Stent 9/02, ISR 11/03 - PTCA; RCA DES 1 /14/14   Essential hypertension   Obesity (BMI 30-39.9)   Discharge Condition: Stable  Diet recommendation: Heart healthy diet  Filed Weights   09/19/14 0833 09/19/14 1654  Weight: 72.576 kg (160 lb) 72.303 kg (159 lb 6.4 oz)    History of present illness:  [name] is a 81 y.o. female with a Past Medical History o CAD status post PCI in 2014, hypertension, dyslipidemia who presents today with the above noted complaint. Per patient, she has been in excellent health over the past few years, without any exertional chest pain or shortness of breath. Around 5 AM this morning, patient noted that she was having palpitations. At the same time she also started experiencing left chest discomfort. Patient describes this as mostly discomfort and not pain. She describes that she also had some tingling/pain in her fingers of the left hand. She describes that the discomfort is across from the left shoulder to the left side of her chest. There was no associated nausea, vomiting, shortness of breath or diaphoresis. She presented to the emergency room with these complaints, initial EKG and cardiac enzymes were negative. The hospitalist service was asked to admit this patient for further evaluation and treatment Currently denies any fever, headache, abdominal pain, nausea, vomiting or diarrhea.  Hospital Course:   Chest pain Patient presented to the hospital with  atypical chest pain, pain is not substernal, not heaviness or relieved by nitroglycerin. Patient reported that she has bilateral shoulder pain and still has some soreness in the left shoulder/pectoralis muscle. Seen by cardiology. Minor nonspecific ST changes on EKG, has negative cardiac enzymes. Cardiology recommended discharge and outpatient pharmacological nuclear scan.  Carotid artery disease Continue aspirin, beta blockers and statins. Follow-up with cardiology as outpatient.  Essential hypertension Result controlled, continue home medications.  Palpitations Patient reported that her heart rate was in the 90s when she felt the palpitations. EKG showed sinus rhythm on arrival. No changes done to her medication.  Procedures:  None  Consultations:  Cardiology  Discharge Exam: Filed Vitals:   09/20/14 0534  BP: 117/50  Pulse: 78  Temp: 98.7 F (37.1 C)  Resp: 15   General: Alert and awake, oriented x3, not in any acute distress. HEENT: anicteric sclera, pupils reactive to light and accommodation, EOMI CVS: S1-S2 clear, no murmur rubs or gallops Chest: clear to auscultation bilaterally, no wheezing, rales or rhonchi Abdomen: soft nontender, nondistended, normal bowel sounds, no organomegaly Extremities: no cyanosis, clubbing or edema noted bilaterally Neuro: Cranial nerves II-XII intact, no focal neurological deficits  Discharge Instructions   Discharge Instructions    Diet - low sodium heart healthy    Complete by:  As directed      Increase activity slowly    Complete by:  As directed           Current Discharge Medication List    CONTINUE these medications which have NOT CHANGED   Details  acetaminophen (TYLENOL) 325 MG tablet Take 650 mg  by mouth every 6 (six) hours as needed. For pain    albuterol (PROVENTIL HFA;VENTOLIN HFA) 108 (90 BASE) MCG/ACT inhaler Inhale 2 puffs into the lungs every 6 (six) hours as needed for wheezing.    aspirin 162 MG EC  tablet Take 81 mg by mouth daily.     Biotin 5000 MCG CAPS Take 5,000 mcg by mouth daily.    calcium citrate-vitamin D (CITRACAL+D) 315-200 MG-UNIT per tablet Take 1 tablet by mouth daily.    lovastatin (ALTOPREV) 20 MG 24 hr tablet Take 20 mg by mouth at bedtime.    metoprolol (LOPRESSOR) 50 MG tablet Take 25 mg by mouth 2 (two) times daily.     traMADol (ULTRAM) 50 MG tablet Take 50 mg by mouth every 6 (six) hours as needed for pain.     triamterene-hydrochlorothiazide (DYAZIDE) 37.5-25 MG per capsule Take 1 capsule by mouth every morning.      STOP taking these medications     cetirizine (ZYRTEC) 10 MG tablet        Allergies  Allergen Reactions  . Atorvastatin Shortness Of Breath  . Brilinta [Ticagrelor] Shortness Of Breath    BLEEDING  . Clopidogrel Bisulfate Shortness Of Breath    BRUISING  . Codeine Nausea And Vomiting  . Sulfonamide Derivatives Other (See Comments)    Intestinal upset   Follow-up Information    Follow up with [facility] On 09/28/2014.   Specialty:  Cardiology   Why:  You have a stress test at 1:30pm. Nothing to eat after midnight the night before   Contact information:   [address] [place] [phone]      Follow up with Isaiah Serge, NP On 10/13/2014.   Specialty:  Cardiology   Why:  @ 9am   Contact information:   [address] [place] [address] [phone]        The results of significant diagnostics from this hospitalization (including imaging, microbiology, ancillary and laboratory) are listed below for reference.    Significant Diagnostic Studies: Dg Chest 2 View  09/19/2014   CLINICAL DATA:  Upper left chest pain radiating into the left on  EXAM: CHEST  2 VIEW  COMPARISON:  08/23/2014  FINDINGS: Cardiac shadow is within normal limits. The lungs are well aerated bilaterally. No focal infiltrate or effusion is seen. No acute bony  abnormality is noted. Degenerative changes of the right shoulder joint are again seen.  IMPRESSION: No acute abnormality noted.   Electronically Signed   By: Inez Catalina M.D.   On: 09/19/2014 09:25   Ct Chest Wo Contrast  08/23/2014   CLINICAL DATA:  Followup of bilateral pulmonary nodules.  EXAM: CT CHEST WITHOUT CONTRAST  TECHNIQUE: Multidetector CT imaging of the chest was performed following the standard protocol without IV contrast.  COMPARISON:  08/17/2013 and 02/17/2013  FINDINGS: Stable noncalcified 5 mm nodule in the right middle lobe and noncalcified 4 mm nodule in the superior segment of the left lower lobe. Stable suggestion of tiny areas of nodularity in the right upper lobe has depicted on the prior study. No new pulmonary nodules identified. Stable scarring at the medial right lung base.  No evidence of pulmonary consolidation, edema or pleural fluid. The heart size is normal. There is no evidence of pericardial fluid. Stable calcified coronary artery plaque. Stable atherosclerosis of the aortic arch without evidence of thoracic aortic aneurysmal disease.  No enlarged lymph nodes are seen.  No evidence of airway obstruction. Visualized upper abdomen shows stable appearance to an inferior right hepatic lobe cyst. Stable spondylosis of the mid to lower thoracic spine.  IMPRESSION: Stable subcentimeter bilateral pulmonary nodules. Stability now documented since 08/14. These are likely benign. One additional follow-up CT could be considered in 1 year.   Electronically Signed   By: Aletta Edouard M.D.   On: 08/23/2014 16:34    Microbiology: No results found for this or any previous visit (from the past 240 hour(s)).   Labs: Basic Metabolic Panel:  Recent Labs Lab 09/19/14 0946 09/19/14 0957  NA  --  140  K  --  3.4*  CL  --  100  GLUCOSE  --  133*  BUN  --  21  CREATININE 0.95 1.00   Liver Function Tests: No results for input(s): AST, ALT, ALKPHOS, BILITOT, PROT, ALBUMIN in the  last 168 hours. No results for input(s): LIPASE, AMYLASE in the last 168 hours. No results for input(s): AMMONIA in the last 168 hours. CBC:  Recent Labs Lab 09/19/14 0946 09/19/14 0957  WBC 7.4  --   NEUTROABS 5.7  --   HGB 13.1 13.9  HCT 39.1 41.0  MCV 92.4  --   PLT 175  --    Cardiac Enzymes:  Recent Labs Lab 09/19/14 0946 09/19/14 1519 09/19/14 1905  TROPONINI 0.03 <0.03 <0.03   BNP: BNP (last 3 results) No results for input(s): BNP in the last 8760 hours.  ProBNP (last 3 results) No results for input(s): PROBNP in the last 8760 hours.  CBG: No results for input(s): GLUCAP in the last 168 hours.     Signed:  Doniel Maiello A  [facility] 09/20/2014, 11:14 AM

## 2014-09-23 ENCOUNTER — Telehealth (HOSPITAL_COMMUNITY): Payer: Self-pay

## 2014-09-23 NOTE — Telephone Encounter (Signed)
Encounter complete. 

## 2014-09-24 ENCOUNTER — Telehealth (HOSPITAL_COMMUNITY): Payer: Self-pay

## 2014-09-24 NOTE — Telephone Encounter (Signed)
Encounter complete. 

## 2014-09-28 ENCOUNTER — Ambulatory Visit (HOSPITAL_COMMUNITY)
Admit: 2014-09-28 | Discharge: 2014-09-28 | Disposition: A | Discharge status: Home / Self Care | Payer: Medicare Other | Source: Ambulatory Visit | Attending: Cardiology | Admitting: Cardiology

## 2014-09-28 DIAGNOSIS — Z87891 Personal history of nicotine dependence: Secondary | ICD-10-CM | POA: Insufficient documentation

## 2014-09-28 DIAGNOSIS — I2510 Atherosclerotic heart disease of native coronary artery without angina pectoris: Secondary | ICD-10-CM | POA: Diagnosis not present

## 2014-09-28 DIAGNOSIS — R5383 Other fatigue: Secondary | ICD-10-CM | POA: Insufficient documentation

## 2014-09-28 DIAGNOSIS — E785 Hyperlipidemia, unspecified: Secondary | ICD-10-CM | POA: Diagnosis not present

## 2014-09-28 DIAGNOSIS — R002 Palpitations: Secondary | ICD-10-CM | POA: Insufficient documentation

## 2014-09-28 DIAGNOSIS — R079 Chest pain, unspecified: Secondary | ICD-10-CM | POA: Diagnosis not present

## 2014-09-28 DIAGNOSIS — E669 Obesity, unspecified: Secondary | ICD-10-CM | POA: Diagnosis not present

## 2014-09-28 DIAGNOSIS — R0609 Other forms of dyspnea: Secondary | ICD-10-CM | POA: Insufficient documentation

## 2014-09-28 DIAGNOSIS — R42 Dizziness and giddiness: Secondary | ICD-10-CM | POA: Diagnosis not present

## 2014-09-28 DIAGNOSIS — Z8249 Family history of ischemic heart disease and other diseases of the circulatory system: Secondary | ICD-10-CM | POA: Diagnosis not present

## 2014-09-28 DIAGNOSIS — I10 Essential (primary) hypertension: Secondary | ICD-10-CM | POA: Insufficient documentation

## 2014-09-28 MED ORDER — AMINOPHYLLINE 25 MG/ML IV SOLN
75.0000 mg | Freq: Once | INTRAVENOUS | Status: AC
  Administered 2014-09-28: 75 mg via INTRAVENOUS

## 2014-09-28 MED ORDER — TECHNETIUM TC 99M SESTAMIBI GENERIC - CARDIOLITE
32.0000 | Freq: Once | INTRAVENOUS | Status: AC | PRN
  Administered 2014-09-28: 32 via INTRAVENOUS

## 2014-09-28 MED ORDER — TECHNETIUM TC 99M SESTAMIBI GENERIC - CARDIOLITE
10.8000 | Freq: Once | INTRAVENOUS | Status: AC | PRN
  Administered 2014-09-28: 11 via INTRAVENOUS

## 2014-09-28 MED ORDER — REGADENOSON 0.4 MG/5ML IV SOLN
0.4000 mg | Freq: Once | INTRAVENOUS | Status: AC
  Administered 2014-09-28: 0.4 mg via INTRAVENOUS

## 2014-09-28 NOTE — Procedures (Addendum)
[facility] [address] [place] [address] [phone]  Cardiology Nuclear Med Study  [name] is a 81 y.o. female     MRN : [id number]     DOB: 14-Oct-1927  Procedure Date: 09/28/2014  Nuclear Med Background Indication for Stress Test:  [facility] and Follow up CAD History:  Asthma and CAD;STENT/PTCA--2002 AND 2003;Last NUC MPI on 01/01/2013;AFIB;NSTEMI Cardiac Risk Factors: Family History - CAD, History of Smoking, Hypertension, Lipids and Overweight  Symptoms:  Chest Pain, Dizziness, DOE, Fatigue, Light-Headedness and Palpitations   Nuclear Pre-Procedure Caffeine/Decaff Intake:  1:00am NPO After: 9:00am   IV Site: R Forearm  IV 0.9% NS with Angio Cath:  22g  Chest Size (in):  n/a IV Started by: Rolene Course, RN  Height: 5' (1.524 m)  Cup Size: B  BMI:  Body mass index is 31.25 kg/(m^2). Weight:  160 lb (72.576 kg)   Tech Comments:  n/a    Nuclear Med Study 1 or 2 day study: 1 day  Stress Test Type:  [place] Provider:  Glenetta Hew, MD   Resting Radionuclide: Technetium 12m Sestamibi  Resting Radionuclide Dose: 10.8 mCi   Stress Radionuclide:  Technetium 70m Sestamibi  Stress Radionuclide Dose: 32.0 mCi           Stress Protocol Rest HR: 83 Stress HR: 114  Rest BP: 147/65 Stress BP: 159/54  Exercise Time (min): n/a METS: n/a          Dose of Adenosine (mg):  n/a Dose of Lexiscan: 0.4 mg  Dose of Atropine (mg): n/a Dose of Dobutamine: n/a mcg/kg/min (at max HR)  Stress Test Technologist: Mellody Memos, CCT Nuclear Technologist: E.Young,CNMT   Rest Procedure:  Myocardial perfusion imaging was performed at rest 45 minutes following the intravenous administration of Technetium 15m Sestamibi. Stress Procedure:  The patient received IV Lexiscan 0.4 mg over 15-seconds.  Technetium 72m Sestamibi injected IV  at 30-seconds.  Patient experienced shortness of breath, throat tightness,  dizziness and was administered 75 mg of Aminophylline IV at 5 minutes. There were no significant changes with Lexiscan.  Quantitative spect images were obtained after a 45 minute delay.  Transient Ischemic Dilatation (Normal <1.22):  0.91 QGS EDV:  33 ml QGS ESV:  06 ml LV Ejection Fraction: 83%        Rest ECG: NSR - Normal EKG  Stress ECG: No significant change from baseline ECG  QPS Raw Data Images:  Normal; no motion artifact; normal heart/lung ratio. Stress Images:  Normal homogeneous uptake in all areas of the myocardium. Rest Images:  Normal homogeneous uptake in all areas of the myocardium. Subtraction (SDS):  No evidence of ischemia.  Impression Exercise Capacity:  Lexiscan with no exercise. BP Response:  Normal blood pressure response. Clinical Symptoms:  No significant symptoms noted. ECG Impression:  No significant ST segment change suggestive of ischemia. Comparison with Prior Nuclear Study: No images to compare  Overall Impression:  Normal stress nuclear study.  LV Wall Motion:  NL LV Function; NL Wall Motion   Lorretta Harp, MD  09/28/2014 5:20 PM

## 2014-09-30 ENCOUNTER — Telehealth: Payer: Self-pay | Admitting: *Deleted

## 2014-09-30 NOTE — Telephone Encounter (Signed)
-----   Message from Eileen Stanford, PA-C sent at 09/28/2014  8:33 PM EDT ----- Can you let her know that nuclear stress test was normal. Thank you!

## 2014-10-11 DIAGNOSIS — I129 Hypertensive chronic kidney disease with stage 1 through stage 4 chronic kidney disease, or unspecified chronic kidney disease: Secondary | ICD-10-CM | POA: Diagnosis not present

## 2014-10-11 DIAGNOSIS — N183 Chronic kidney disease, stage 3 (moderate): Secondary | ICD-10-CM | POA: Diagnosis not present

## 2014-10-11 DIAGNOSIS — R232 Flushing: Secondary | ICD-10-CM | POA: Diagnosis not present

## 2014-10-11 DIAGNOSIS — M542 Cervicalgia: Secondary | ICD-10-CM | POA: Diagnosis not present

## 2014-10-13 ENCOUNTER — Ambulatory Visit (INDEPENDENT_AMBULATORY_CARE_PROVIDER_SITE_OTHER): Payer: Medicare Other | Admitting: Cardiology

## 2014-10-13 ENCOUNTER — Encounter: Payer: Self-pay | Admitting: Cardiology

## 2014-10-13 VITALS — BP 133/73 | HR 81 | Ht 60.0 in | Wt 162.3 lb

## 2014-10-13 DIAGNOSIS — I2510 Atherosclerotic heart disease of native coronary artery without angina pectoris: Secondary | ICD-10-CM | POA: Diagnosis not present

## 2014-10-13 DIAGNOSIS — R079 Chest pain, unspecified: Secondary | ICD-10-CM | POA: Diagnosis not present

## 2014-10-13 DIAGNOSIS — R002 Palpitations: Secondary | ICD-10-CM | POA: Diagnosis not present

## 2014-10-13 NOTE — Progress Notes (Signed)
Cardiology Office Note   Date:  10/13/2014   ID:  [name], DOB 09/15/1927, MRN [id number]  PCP:  Mathews Argyle, MD  Cardiologist:  Dr. Ellyn Hack    Chief Complaint  Patient presents with  . Hospitalization Follow-up    nuc results.       History of Present Illness: [name] is a 81 y.o. female who presents for CAD and recent hospitalization after admit for racing HR and shoulder and neck pain.  She had received steroid injections to shoulders prior to admit.  Enzymes negative  For MI.  She was evaluated due to her hx CAD-ASCAD with 7/'02: 1st Cutting PTCA- OM2 --> restenosed 9/'02 NSTEMI--> 3.0 mm x 12 mm BMS-OM 2; b) 11/'03 DOE w/ + Cardiolite - Cutter PTCA for ISR -- patent in 2004 (after False + Cardiolite); c) 07/2012 - Unstable Angina -- PCI to proximal RCA with Promus Premier DES 2.75 mm x 20 mm (3.0 mm), patent OM2 stent ~10% ISR (HARDING)-  She had no further arrhthymias and was discharged with plan for outpt nuc study which she has had.  Back today for results.  Impression of adenosine lexiscan: Exercise Capacity: Lexiscan with no exercise. BP Response: Normal blood pressure response. Clinical Symptoms: No significant symptoms noted. ECG Impression: No significant ST segment change suggestive of  ischemia. Overall Impression: Normal stress nuclear study. LV Wall Motion: NL LV Function; NL Wall Motion  Today no pain and no recurrence of racing HR.    Past Medical History  Diagnosis Date  . CAD S/P percutaneous coronary angioplasty 2002, 2003,2014    a) Unstable Angina 7/'02: 1st Cutting PTCA- OM2 --> restenosed 9/'02 NSTEMI--> 3.0 mm x 12 mm BMS-OM 2; b) 11/'03 DOE w/ + Cardiolite - Cutter PTCA for ISR -- patent in 2004 (after False + Cardiolite); c) 07/2012 - Unstable Angina -- PCI to proximal RCA with Promus Premier DES 2.75 mm x 20 mm (3.0 mm), patent OM2 stent ~10% ISR (HARDING)  . Non-Q wave ST elevation myocardial infarction (STEMI) involving  left circumflex coronary artery 03/2001    a) Severe thrombic ISR of prior PTCA site in OM2 --> BMS PCI; b) Echo 08/2012: Normal LV size & function.  EF 55-60%, no regional WMA, Gr 1 DD, mild MR, mild-mod TR - NO Pulmonary HTN  . Coronary stent restenosis due to scar tissue 05/2002    Cutting Balloon PTCA to OM2 BMS  . Medication intolerance - Plavix, Effient, Brilinta     Easy bruising, GI bleeds (led to Dx of Colon CA), Brilinta - dyspnea  . Hyperlipidemia with target LDL less than 70   . Essential hypertension   . Aortic sclerosis     With murmur  . Asthma   . Multiple lung nodules on CT 08/2012    scattered, bilateral but right>left.   . H/O Colon cancer 10/2011    GI - Drs. Carlean Purl - colonoscopy 10/2012: diverticulosis, stable ileocolic anastomosis of R colon  . Adenomatous colon polyp MAY 2013    HOSPITALIZED AT [facility] WITH HGB 7 -TRANSFUSED-MASS FOUND IN ASCENDING COLON  . Iron deficiency anemia due to chronic blood loss 10/2011    Blood Transfusion (EGD 09/2012 - unremarkable)  . Internal hemorrhoids   . Diverticulosis 10/2012    Seen on Colonoscopy  . Osteoarthritis (arthritis due to wear and tear of joints)     Bilateral Hip Arthroplasty, L Knee TKA  . DJD (degenerative joint disease), thoracolumbar     Back,  Hips & Knees  . Osteoporosis   . Complication of anesthesia 2009    HIP REPLACEMENT-PT HAD HARD TIME WAKING UP--FELT LIKE SHE COULDN'T BREATHE    Past Surgical History  Procedure Laterality Date  . Appendectomy    . Cholecystectomy    . Coronary angioplasty with stent placement  2002; 2003; 07/2012    a)  7/'02: 1st Cutting PTCA- OM2 --> restenosed 9/'02 --> 3.0 mm x 12 mm BMS-OM 2; b) 11/'03 - Cutter PTCA for ISR -- patent in 2004; c) PCI to proximal RCA with Promus Premier DES 2.75 mm x 20 mm (3.0 mm), patent OM stent ~10% ISR  . Colonoscopy w/ biopsies and polypectomy  06/15/2008    adenomatous polyps, diverticulosis, internal hemorrhoids  . Total hip arthroplasty   09/08/2007    left, Dr. Wynelle Link  . Colonoscopy  11/16/2011    Procedure: COLONOSCOPY;  Surgeon: Gatha Mayer, MD;  Location: [place];  Service: Endoscopy;  Laterality: N/A;  . Replacement total knee Left 2003  . Total hip arthroplasty Right 2003  . Esophagogastroduodenoscopy N/A 10/23/2012    Procedure: ESOPHAGOGASTRODUODENOSCOPY (EGD);  Surgeon: Inda Castle, MD;  Location: [place];  Service: Endoscopy;  Laterality: N/A;  . Colonoscopy  10/2012    Gessner: diverticulosis, stable R colon ileocolic anastomosis  . Incisional hernia repair N/A 01/29/2013    Procedure: LAPAROSCOPIC INCISIONAL HERNIA;  Surgeon: Harl Bowie, MD;  Location: [facility];  Service: General;  Laterality: N/A;  . Insertion of mesh N/A 01/29/2013    Procedure: INSERTION OF MESH;  Surgeon: Harl Bowie, MD;  Location: [facility];  Service: General;  Laterality: N/A;  . Transthoracic echocardiogram  08/2012    Normal LV size & function.  EF 55-60%, no regional WMA, Gr 1 DD, mild MR, mild-mod TR; aortic sclerosis without stenosis  . Left heart catheterization with coronary angiogram N/A 07/15/2012    Procedure: LEFT HEART CATHETERIZATION WITH CORONARY ANGIOGRAM;  Surgeon: Leonie Man, MD;  Location: [facility] C[facility] LAB;  Service: Cardiovascular;  Laterality: N/A;  . Percutaneous coronary stent intervention (pci-s)  07/15/2012    Procedure: PERCUTANEOUS CORONARY STENT INTERVENTION (PCI-S);  Surgeon: Leonie Man, MD;  Location: [facility] CATH LAB;  Service: Cardiovascular;;     Current Outpatient Prescriptions  Medication Sig Dispense Refill  . acetaminophen (TYLENOL) 325 MG tablet Take 650 mg by mouth every 6 (six) hours as needed. For pain    . ADVAIR DISKUS 250-50 MCG/DOSE AEPB Inhale 1 puff into the lungs 2 (two) times daily.  0  . albuterol (PROVENTIL HFA;VENTOLIN HFA) 108 (90 BASE) MCG/ACT inhaler Inhale 2 puffs into the lungs every 6 (six) hours as needed for wheezing.    [name] Kitchen aspirin 162 MG EC tablet Take 81 mg  by mouth daily.     . Biotin 5000 MCG CAPS Take 5,000 mcg by mouth daily.    . calcium citrate-vitamin D (CITRACAL+D) 315-200 MG-UNIT per tablet Take 1 tablet by mouth daily.    [name] Kitchen lovastatin (MEVACOR) 20 MG tablet Take 1 tablet by mouth daily.    . metoprolol (LOPRESSOR) 50 MG tablet Take 25 mg by mouth 2 (two) times daily.     . traMADol (ULTRAM) 50 MG tablet Take 50 mg by mouth every 6 (six) hours as needed for pain.     [name] Kitchen triamterene-hydrochlorothiazide (DYAZIDE) 37.5-25 MG per capsule Take 1 capsule by mouth every morning.     No current facility-administered medications for this visit.    Allergies:  Atorvastatin; Brilinta; Clopidogrel bisulfate; Codeine; and Sulfonamide derivatives    Social History:  The patient  reports that she quit smoking about 32 years ago. Her smoking use included Cigarettes. She quit after 30 years of use. She has never used smokeless tobacco. She reports that she drinks alcohol. She reports that she does not use illicit drugs.   Family History:  The patient's family history includes Colon cancer in her brother and sister; Heart disease in her mother and sister; Pancreatic cancer in her brother and father; Prostate cancer in her brother and father.    ROS:  General:no colds or fevers, no weight changes Skin:no rashes or ulcers HEENT:no blurred vision, no congestion CV:see HPI PUL:see HPI GI:no diarrhea constipation or melena, no indigestion GU:no hematuria, no dysuria MS:no joint pain, no claudication Neuro:no syncope, no lightheadedness Endo:no diabetes, no thyroid disease  Wt Readings from Last 3 Encounters:  10/13/14 162 lb 4.8 oz (73.619 kg)  09/28/14 160 lb (72.576 kg)  09/19/14 159 lb 6.4 oz (72.303 kg)     PHYSICAL EXAM: VS:  BP 133/73 mmHg  Pulse 81  Ht 5' (1.524 m)  Wt 162 lb 4.8 oz (73.619 kg)  BMI 31.70 kg/m2 , BMI Body mass index is 31.7 kg/(m^2). General:Pleasant affect, NAD Skin:Warm and dry, brisk capillary  refill HEENT:normocephalic, sclera clear, mucus membranes moist Neck:supple, no JVD, no bruits  Heart:S1S2 RRR without murmur, gallup, rub or click Lungs:clear without rales, rhonchi, or wheezes IEP:PIRJ, non tender, + BS, do not palpate liver spleen or masses Ext:no lower ext edema, 2+ pedal pulses, 2+ radial pulses Neuro:alert and oriented X 3, MAE, follows commands, + facial symmetry    EKG:  EKG is NOT ordered today.    Recent Labs: 09/12/2014: ALT 24 09/19/2014: BUN 21; Creatinine 1.00; Hemoglobin 13.9; Platelets 175; Potassium 3.4*; Sodium 140    Lipid Panel    Component Value Date/Time   CHOL 122 10/23/2012 0612   TRIG 116 10/23/2012 0612   HDL 67 10/23/2012 0612   CHOLHDL 1.8 10/23/2012 0612   VLDL 23 10/23/2012 0612   LDLCALC 32 10/23/2012 0612       Other studies Reviewed: Additional studies/ records that were reviewed today include: hospital notes and nuc study.   ASSESSMENT AND PLAN:  1. Atypical chest pain-neg nuc study.  No further pain, follow up with Dr. Roni Bread in 6 months.  2. Coronary artery disease, native vessel, with multiple PCI procedures  3. Heart palpitations- described as racing HR.  No further episodes.  I explained if these did return to please call us and we would range an event monitor to rule out PAF, which would be important to know, due to risk for stroke.  She agreed to call if further palpitations.   4. Essential hypertension  5. Dyslipidemia followed by PCP on mevacor    Current medicines are reviewed with the patient today.  The patient Has no concerns regarding medicines.  The following changes have been made:  See above Labs/ tests ordered today include:see above  Disposition:   FU:  see above  Lennie Muckle, NP  10/13/2014 9:14 AM    [facility] [address], [place], [place], [place] Phone: [phone]; Fax: [phone]

## 2014-10-13 NOTE — Patient Instructions (Signed)
Your physician recommends that you schedule a follow-up appointment in: September with Dr. Ellyn Hack.  If you have recurrent and/or frequent episodes of a racing heart beat call our office to have an Event Monitor placed.

## 2014-11-15 DIAGNOSIS — R8290 Unspecified abnormal findings in urine: Secondary | ICD-10-CM | POA: Diagnosis not present

## 2014-11-15 DIAGNOSIS — M545 Low back pain: Secondary | ICD-10-CM | POA: Diagnosis not present

## 2014-11-15 DIAGNOSIS — K5901 Slow transit constipation: Secondary | ICD-10-CM | POA: Diagnosis not present

## 2014-12-27 ENCOUNTER — Other Ambulatory Visit: Payer: Self-pay

## 2015-01-07 ENCOUNTER — Encounter: Payer: Self-pay | Admitting: *Deleted

## 2015-01-12 DIAGNOSIS — J019 Acute sinusitis, unspecified: Secondary | ICD-10-CM | POA: Diagnosis not present

## 2015-01-17 ENCOUNTER — Encounter: Payer: Self-pay | Admitting: Cardiovascular Disease

## 2015-01-17 ENCOUNTER — Encounter: Payer: Self-pay | Admitting: Cardiology

## 2015-01-24 ENCOUNTER — Encounter: Payer: Self-pay | Admitting: Cardiology

## 2015-02-03 DIAGNOSIS — M65342 Trigger finger, left ring finger: Secondary | ICD-10-CM | POA: Diagnosis not present

## 2015-02-03 DIAGNOSIS — M65332 Trigger finger, left middle finger: Secondary | ICD-10-CM | POA: Diagnosis not present

## 2015-03-22 DIAGNOSIS — M5032 Other cervical disc degeneration, mid-cervical region: Secondary | ICD-10-CM | POA: Diagnosis not present

## 2015-03-30 DIAGNOSIS — M12812 Other specific arthropathies, not elsewhere classified, left shoulder: Secondary | ICD-10-CM | POA: Diagnosis not present

## 2015-03-30 DIAGNOSIS — M12811 Other specific arthropathies, not elsewhere classified, right shoulder: Secondary | ICD-10-CM | POA: Diagnosis not present

## 2015-04-14 DIAGNOSIS — E7800 Pure hypercholesterolemia, unspecified: Secondary | ICD-10-CM | POA: Diagnosis not present

## 2015-04-14 DIAGNOSIS — Z0000 Encounter for general adult medical examination without abnormal findings: Secondary | ICD-10-CM | POA: Diagnosis not present

## 2015-04-14 DIAGNOSIS — N183 Chronic kidney disease, stage 3 (moderate): Secondary | ICD-10-CM | POA: Diagnosis not present

## 2015-04-14 DIAGNOSIS — R911 Solitary pulmonary nodule: Secondary | ICD-10-CM | POA: Diagnosis not present

## 2015-04-14 DIAGNOSIS — Z79899 Other long term (current) drug therapy: Secondary | ICD-10-CM | POA: Diagnosis not present

## 2015-04-14 DIAGNOSIS — R350 Frequency of micturition: Secondary | ICD-10-CM | POA: Diagnosis not present

## 2015-04-14 DIAGNOSIS — Z1389 Encounter for screening for other disorder: Secondary | ICD-10-CM | POA: Diagnosis not present

## 2015-04-14 DIAGNOSIS — I129 Hypertensive chronic kidney disease with stage 1 through stage 4 chronic kidney disease, or unspecified chronic kidney disease: Secondary | ICD-10-CM | POA: Diagnosis not present

## 2015-04-14 DIAGNOSIS — D692 Other nonthrombocytopenic purpura: Secondary | ICD-10-CM | POA: Diagnosis not present

## 2015-04-25 DIAGNOSIS — Z23 Encounter for immunization: Secondary | ICD-10-CM | POA: Diagnosis not present

## 2015-05-11 DIAGNOSIS — M50821 Other cervical disc disorders at C4-C5 level: Secondary | ICD-10-CM | POA: Diagnosis not present

## 2015-05-11 DIAGNOSIS — M50823 Other cervical disc disorders at C6-C7 level: Secondary | ICD-10-CM | POA: Diagnosis not present

## 2015-05-11 DIAGNOSIS — M50822 Other cervical disc disorders at C5-C6 level: Secondary | ICD-10-CM | POA: Diagnosis not present

## 2015-05-11 DIAGNOSIS — M5033 Other cervical disc degeneration, cervicothoracic region: Secondary | ICD-10-CM | POA: Diagnosis not present

## 2015-06-01 DIAGNOSIS — M50823 Other cervical disc disorders at C6-C7 level: Secondary | ICD-10-CM | POA: Diagnosis not present

## 2015-06-01 DIAGNOSIS — M47812 Spondylosis without myelopathy or radiculopathy, cervical region: Secondary | ICD-10-CM | POA: Diagnosis not present

## 2015-07-12 ENCOUNTER — Encounter: Payer: Self-pay | Admitting: Internal Medicine

## 2015-07-12 ENCOUNTER — Encounter (HOSPITAL_COMMUNITY): Payer: Self-pay | Admitting: *Deleted

## 2015-07-12 ENCOUNTER — Ambulatory Visit (INDEPENDENT_AMBULATORY_CARE_PROVIDER_SITE_OTHER): Payer: Medicare Other | Admitting: Internal Medicine

## 2015-07-12 VITALS — BP 124/64 | HR 84 | Ht 58.75 in | Wt 161.0 lb

## 2015-07-12 DIAGNOSIS — Z85038 Personal history of other malignant neoplasm of large intestine: Secondary | ICD-10-CM

## 2015-07-12 DIAGNOSIS — R194 Change in bowel habit: Secondary | ICD-10-CM | POA: Diagnosis not present

## 2015-07-12 NOTE — Progress Notes (Signed)
Subjective:    Patient ID: [name], female    DOB: [birth year], 82 y.o.   MRN: [id number] Cc: Change in bowel habits HPI The patient is an 82 year old white woman I know from previous right-sided colon cancer found and resected in 2013. Year now with a several month history of changes in bowel habits with irregularity and sometimes liquid and sometimes hard stools. It sounds like she might have had a period of more severe constipation followed by these changes though I'm not clear on that. She's had a few episodes of urge incontinence of feces. She does not report any melena or rectal bleeding. She has some mild right lower quadrant pain at times it feels better with defecation. She has not changed medications. Wt Readings from Last 3 Encounters:  07/12/15 161 lb (73.029 kg)  10/13/14 162 lb 4.8 oz (73.619 kg)  09/28/14 160 lb (72.576 kg)   Allergies  Allergen Reactions  . Atorvastatin Shortness Of Breath  . Brilinta [Ticagrelor] Shortness Of Breath    BLEEDING  . Clopidogrel Bisulfate Shortness Of Breath    BRUISING  . Codeine Nausea And Vomiting  . Sulfonamide Derivatives Other (See Comments)    Intestinal upset   Outpatient Prescriptions Prior to Visit  Medication Sig Dispense Refill  . albuterol (PROVENTIL HFA;VENTOLIN HFA) 108 (90 BASE) MCG/ACT inhaler Inhale 2 puffs into the lungs every 6 (six) hours as needed for wheezing.    [name] Kitchen aspirin 162 MG EC tablet Take 81 mg by mouth daily.     . Biotin 5000 MCG CAPS Take 5,000 mcg by mouth daily.    . calcium citrate-vitamin D (CITRACAL+D) 315-200 MG-UNIT per tablet Take 0.5 tablets by mouth daily.     [name] Kitchen lovastatin (MEVACOR) 20 MG tablet Take 1 tablet by mouth daily.    . metoprolol (LOPRESSOR) 50 MG tablet Take 25 mg by mouth 2 (two) times daily.     . traMADol (ULTRAM) 50 MG tablet Take 50 mg by mouth every 6 (six) hours as needed for pain.     [name] Kitchen triamterene-hydrochlorothiazide (DYAZIDE) 37.5-25 MG per capsule Take 1 capsule by  mouth every morning.    [name] Kitchen acetaminophen (TYLENOL) 325 MG tablet Take 650 mg by mouth every 6 (six) hours as needed. For pain    . ADVAIR DISKUS 250-50 MCG/DOSE AEPB Inhale 1 puff into the lungs 2 (two) times daily.  0   No facility-administered medications prior to visit.   Past Medical History  Diagnosis Date  . CAD S/P percutaneous coronary angioplasty 2002, 2003,2014    a) Unstable Angina 7/'02: 1st Cutting PTCA- OM2 --> restenosed 9/'02 NSTEMI--> 3.0 mm x 12 mm BMS-OM 2; b) 11/'03 DOE w/ + Cardiolite - Cutter PTCA for ISR -- patent in 2004 (after False + Cardiolite); c) 07/2012 - Unstable Angina -- PCI to proximal RCA with Promus Premier DES 2.75 mm x 20 mm (3.0 mm), patent OM2 stent ~10% ISR (HARDING)  . Non-Q wave ST elevation myocardial infarction (STEMI) involving left circumflex coronary artery 03/2001    a) Severe thrombic ISR of prior PTCA site in OM2 --> BMS PCI; b) Echo 08/2012: Normal LV size & function.  EF 55-60%, no regional WMA, Gr 1 DD, mild MR, mild-mod TR - NO Pulmonary HTN  . Coronary stent restenosis due to scar tissue 05/2002    Cutting Balloon PTCA to OM2 BMS  . Medication intolerance - Plavix, Effient, Brilinta     Easy bruising, GI bleeds (led to Dx of  Colon CA), Brilinta - dyspnea  . Hyperlipidemia with target LDL less than 70   . Essential hypertension   . Aortic sclerosis (HCC)     With murmur  . Asthma   . Multiple lung nodules on CT 08/2012    scattered, bilateral but right>left.   . H/O Colon cancer 10/2011    GI - Drs. Carlean Purl - colonoscopy 10/2012: diverticulosis, stable ileocolic anastomosis of R colon  . Adenomatous colon polyp MAY 2013    HOSPITALIZED AT [facility] WITH HGB 7 -TRANSFUSED-MASS FOUND IN ASCENDING COLON  . Iron deficiency anemia due to chronic blood loss 10/2011    Blood Transfusion (EGD 09/2012 - unremarkable)  . Internal hemorrhoids   . Diverticulosis 10/2012    Seen on Colonoscopy  . Osteoarthritis (arthritis due to wear and tear of joints)      Bilateral Hip Arthroplasty, L Knee TKA  . DJD (degenerative joint disease), thoracolumbar     Back, Hips & Knees  . Osteoporosis   . Complication of anesthesia 2009    HIP REPLACEMENT-PT HAD HARD TIME WAKING UP--FELT LIKE SHE COULDN'T BREATHE   Past Surgical History  Procedure Laterality Date  . Appendectomy    . Cholecystectomy    . Coronary angioplasty with stent placement  2002; 2003; 07/2012    a)  7/'02: 1st Cutting PTCA- OM2 --> restenosed 9/'02 --> 3.0 mm x 12 mm BMS-OM 2; b) 11/'03 - Cutter PTCA for ISR -- patent in 2004; c) PCI to proximal RCA with Promus Premier DES 2.75 mm x 20 mm (3.0 mm), patent OM stent ~10% ISR  . Colonoscopy w/ biopsies and polypectomy  06/15/2008    adenomatous polyps, diverticulosis, internal hemorrhoids  . Total hip arthroplasty  09/08/2007    left, Dr. Wynelle Link  . Colonoscopy  11/16/2011    Procedure: COLONOSCOPY;  Surgeon: Gatha Mayer, MD;  Location: [place];  Service: Endoscopy;  Laterality: N/A;  . Replacement total knee Left 2003  . Total hip arthroplasty Right 2003  . Esophagogastroduodenoscopy N/A 10/23/2012    Procedure: ESOPHAGOGASTRODUODENOSCOPY (EGD);  Surgeon: Inda Castle, MD;  Location: [place];  Service: Endoscopy;  Laterality: N/A;  . Colonoscopy  10/2012    Gessner: diverticulosis, stable R colon ileocolic anastomosis  . Incisional hernia repair N/A 01/29/2013    Procedure: LAPAROSCOPIC INCISIONAL HERNIA;  Surgeon: Harl Bowie, MD;  Location: [facility];  Service: General;  Laterality: N/A;  . Insertion of mesh N/A 01/29/2013    Procedure: INSERTION OF MESH;  Surgeon: Harl Bowie, MD;  Location: [facility];  Service: General;  Laterality: N/A;  . Transthoracic echocardiogram  08/2012    Normal LV size & function.  EF 55-60%, no regional WMA, Gr 1 DD, mild MR, mild-mod TR; aortic sclerosis without stenosis  . Left heart catheterization with coronary angiogram N/A 07/15/2012    Procedure: LEFT HEART CATHETERIZATION WITH  CORONARY ANGIOGRAM;  Surgeon: Leonie Man, MD;  Location[facility] CATH LAB;  Service: Cardiovascular;  Laterality: N/A;  . Percutaneous coronary stent intervention (pci-s)  07/15/2012    Procedure: PERCUTANEOUS CORONARY STENT INTERVENTION (PCI-S);  Surgeon: Leonie Man, MD;  Location: [facility] CATH LAB;  Service: Cardiovascular;;   Social History   Social History  . Marital Status: Married    Spouse Name: N/A  . Number of Children: 2  . Years of Education: N/A   Occupational History  . retired    Social History Main Topics  . Smoking status: Former Smoker -- 39  years    Types: Cigarettes    Quit date: 07/02/1982  . Smokeless tobacco: Never Used  . Alcohol Use: 0.0 oz/week    0 Standard drinks or equivalent per week     Comment: occ wine  . Drug Use: No  . Sexual Activity: Not Asked   Other Topics Concern  . None   Social History Narrative   Married, Mother of 2 adopted children. She has 3 grandchildren.   She works as a Psychologist, occupational at [employer].   She quit smoking in 1984, and does not drink alcohol.   Family History  Problem Relation Age of Onset  . Pancreatic cancer Father   . Prostate cancer Father   . Pancreatic cancer Brother   . Heart disease Mother   . Heart disease Sister   . Colon cancer Brother     ? Colostomy  . Prostate cancer Brother   . Colon cancer Sister   . Skin cancer Father   . Hypertension Mother   . Melanoma Brother    Review of Systems     Objective:   Physical Exam @BP  124/64 mmHg  Pulse 84  Ht 4' 10.75" (1.492 m)  Wt 161 lb (73.029 kg)  BMI 32.81 kg/m2@  General:  NAD Eyes:   anicteric Lungs:  clear Heart:: S1S2 no rubs, murmurs or gallops  Rectal exam with female staff present demonstrated normal anoderm normal resting tone. There were pieces of formed brown stool in the rectal vault. In the more proximal rectum there was a questionable abnormality with a firm area that could've been submucosal. It also could be hard  stool.  Abdomen:  soft and nontender, BS+   Data Reviewed:   Last colonoscopy 2014 CT scan 2014 with severe sigmoid diverticulosis. CBC was normal in March 2016.    Assessment & Plan:   1. Change in bowel habits and question of abnormal rectal exam  2. Personal history of colon cancer    I think it's probably her severe diverticulosis causing this, however are not sure if there was an abnormality on the rectal exam. Given her recent history and her prior history of colon cancer, I think a colonoscopy is appropriate.The risks and benefits as well as alternatives of endoscopic procedure(s) have been discussed and reviewed. All questions answered. The patient agrees to proceed.  I appreciate the opportunity to care for this patient. CC: Mathews Argyle, MD

## 2015-07-12 NOTE — Patient Instructions (Addendum)
  You have been scheduled for a colonoscopy. Please follow written instructions given to you at your visit today.  Please pick up your over the counter prep supplies at the pharmacy. If you use inhalers (even only as needed), please bring them with you on the day of your procedure.   Take a  17 gram size dose of Miralax daily.  I appreciate the opportunity to care for you. Silvano Rusk, MD, [facility]

## 2015-07-13 ENCOUNTER — Telehealth: Payer: Self-pay | Admitting: Internal Medicine

## 2015-07-13 NOTE — Telephone Encounter (Signed)
Left message for patient to call back  

## 2015-07-13 NOTE — Telephone Encounter (Signed)
Patient reports that she is having excess gas and belching.  She is not sure if this is from all of her supplements or if it is related to her colon symptoms.

## 2015-07-13 NOTE — Telephone Encounter (Signed)
Not sure either Let's see what the colonoscopy shows on Monday

## 2015-07-18 ENCOUNTER — Encounter (HOSPITAL_COMMUNITY)
Admission: RE | Disposition: A | Discharge status: Home / Self Care | Payer: Self-pay | Source: Ambulatory Visit | Attending: Internal Medicine

## 2015-07-18 ENCOUNTER — Ambulatory Visit (HOSPITAL_COMMUNITY): Payer: Medicare Other | Admitting: Anesthesiology

## 2015-07-18 ENCOUNTER — Ambulatory Visit (HOSPITAL_COMMUNITY)
Admission: RE | Admit: 2015-07-18 | Discharge: 2015-07-18 | Disposition: A | Discharge status: Home / Self Care | Payer: Medicare Other | Source: Ambulatory Visit | Attending: Internal Medicine | Admitting: Internal Medicine

## 2015-07-18 ENCOUNTER — Encounter (HOSPITAL_COMMUNITY): Payer: Self-pay | Admitting: Certified Registered Nurse Anesthetist

## 2015-07-18 DIAGNOSIS — Z96652 Presence of left artificial knee joint: Secondary | ICD-10-CM | POA: Insufficient documentation

## 2015-07-18 DIAGNOSIS — K5790 Diverticulosis of intestine, part unspecified, without perforation or abscess without bleeding: Secondary | ICD-10-CM | POA: Diagnosis not present

## 2015-07-18 DIAGNOSIS — Z980 Intestinal bypass and anastomosis status: Secondary | ICD-10-CM | POA: Diagnosis not present

## 2015-07-18 DIAGNOSIS — R194 Change in bowel habit: Secondary | ICD-10-CM | POA: Diagnosis not present

## 2015-07-18 DIAGNOSIS — Z79899 Other long term (current) drug therapy: Secondary | ICD-10-CM | POA: Diagnosis not present

## 2015-07-18 DIAGNOSIS — K5730 Diverticulosis of large intestine without perforation or abscess without bleeding: Secondary | ICD-10-CM | POA: Diagnosis not present

## 2015-07-18 DIAGNOSIS — Z7951 Long term (current) use of inhaled steroids: Secondary | ICD-10-CM | POA: Diagnosis not present

## 2015-07-18 DIAGNOSIS — Z7982 Long term (current) use of aspirin: Secondary | ICD-10-CM | POA: Diagnosis not present

## 2015-07-18 DIAGNOSIS — R918 Other nonspecific abnormal finding of lung field: Secondary | ICD-10-CM | POA: Insufficient documentation

## 2015-07-18 DIAGNOSIS — I10 Essential (primary) hypertension: Secondary | ICD-10-CM | POA: Insufficient documentation

## 2015-07-18 DIAGNOSIS — Z86010 Personal history of colonic polyps: Secondary | ICD-10-CM | POA: Diagnosis not present

## 2015-07-18 DIAGNOSIS — M479 Spondylosis, unspecified: Secondary | ICD-10-CM | POA: Insufficient documentation

## 2015-07-18 DIAGNOSIS — Z96643 Presence of artificial hip joint, bilateral: Secondary | ICD-10-CM | POA: Insufficient documentation

## 2015-07-18 DIAGNOSIS — Z96649 Presence of unspecified artificial hip joint: Secondary | ICD-10-CM | POA: Diagnosis not present

## 2015-07-18 DIAGNOSIS — E669 Obesity, unspecified: Secondary | ICD-10-CM | POA: Diagnosis not present

## 2015-07-18 DIAGNOSIS — I252 Old myocardial infarction: Secondary | ICD-10-CM | POA: Insufficient documentation

## 2015-07-18 DIAGNOSIS — M1990 Unspecified osteoarthritis, unspecified site: Secondary | ICD-10-CM | POA: Insufficient documentation

## 2015-07-18 DIAGNOSIS — J45909 Unspecified asthma, uncomplicated: Secondary | ICD-10-CM | POA: Insufficient documentation

## 2015-07-18 DIAGNOSIS — I2510 Atherosclerotic heart disease of native coronary artery without angina pectoris: Secondary | ICD-10-CM | POA: Insufficient documentation

## 2015-07-18 DIAGNOSIS — M1711 Unilateral primary osteoarthritis, right knee: Secondary | ICD-10-CM | POA: Insufficient documentation

## 2015-07-18 DIAGNOSIS — E785 Hyperlipidemia, unspecified: Secondary | ICD-10-CM | POA: Diagnosis not present

## 2015-07-18 DIAGNOSIS — Z955 Presence of coronary angioplasty implant and graft: Secondary | ICD-10-CM | POA: Diagnosis not present

## 2015-07-18 DIAGNOSIS — Z87891 Personal history of nicotine dependence: Secondary | ICD-10-CM | POA: Diagnosis not present

## 2015-07-18 DIAGNOSIS — Z85038 Personal history of other malignant neoplasm of large intestine: Secondary | ICD-10-CM | POA: Diagnosis not present

## 2015-07-18 DIAGNOSIS — Z6832 Body mass index (BMI) 32.0-32.9, adult: Secondary | ICD-10-CM | POA: Insufficient documentation

## 2015-07-18 DIAGNOSIS — Z9049 Acquired absence of other specified parts of digestive tract: Secondary | ICD-10-CM | POA: Diagnosis not present

## 2015-07-18 HISTORY — PX: COLONOSCOPY WITH PROPOFOL: SHX5780

## 2015-07-18 SURGERY — COLONOSCOPY WITH PROPOFOL
Anesthesia: Monitor Anesthesia Care

## 2015-07-18 MED ORDER — PROPOFOL 10 MG/ML IV BOLUS
INTRAVENOUS | Status: AC
  Filled 2015-07-18: qty 20

## 2015-07-18 MED ORDER — BENEFIBER PO POWD: ORAL | Status: DC

## 2015-07-18 MED ORDER — LACTATED RINGERS IV SOLN
INTRAVENOUS | Status: DC | PRN
  Administered 2015-07-18: 10:00:00 via INTRAVENOUS

## 2015-07-18 MED ORDER — POLYETHYLENE GLYCOL 3350 17 GM/SCOOP PO POWD: ORAL | Status: DC

## 2015-07-18 MED ORDER — PROPOFOL 10 MG/ML IV BOLUS
INTRAVENOUS | Status: DC | PRN
Start: 2015-07-18 — End: 2015-07-18
  Administered 2015-07-18 (×2): 40 mg via INTRAVENOUS
  Administered 2015-07-18 (×3): 20 mg via INTRAVENOUS

## 2015-07-18 SURGICAL SUPPLY — 22 items

## 2015-07-18 NOTE — H&P (View-Only) (Signed)
Subjective:    Patient ID: [name], female    DOB: Feb 20, 1928, 82 y.o.   MRN: [id number] Cc: Change in bowel habits HPI The patient is an 82 year old white woman I know from previous right-sided colon cancer found and resected in 2013. Year now with a several month history of changes in bowel habits with irregularity and sometimes liquid and sometimes hard stools. It sounds like she might have had a period of more severe constipation followed by these changes though I'm not clear on that. She's had a few episodes of urge incontinence of feces. She does not report any melena or rectal bleeding. She has some mild right lower quadrant pain at times it feels better with defecation. She has not changed medications. Wt Readings from Last 3 Encounters:  07/12/15 161 lb (73.029 kg)  10/13/14 162 lb 4.8 oz (73.619 kg)  09/28/14 160 lb (72.576 kg)   Allergies  Allergen Reactions  . Atorvastatin Shortness Of Breath  . Brilinta [Ticagrelor] Shortness Of Breath    BLEEDING  . Clopidogrel Bisulfate Shortness Of Breath    BRUISING  . Codeine Nausea And Vomiting  . Sulfonamide Derivatives Other (See Comments)    Intestinal upset   Outpatient Prescriptions Prior to Visit  Medication Sig Dispense Refill  . albuterol (PROVENTIL HFA;VENTOLIN HFA) 108 (90 BASE) MCG/ACT inhaler Inhale 2 puffs into the lungs every 6 (six) hours as needed for wheezing.    [name] Kitchen aspirin 162 MG EC tablet Take 81 mg by mouth daily.     . Biotin 5000 MCG CAPS Take 5,000 mcg by mouth daily.    . calcium citrate-vitamin D (CITRACAL+D) 315-200 MG-UNIT per tablet Take 0.5 tablets by mouth daily.     [name] Kitchen lovastatin (MEVACOR) 20 MG tablet Take 1 tablet by mouth daily.    . metoprolol (LOPRESSOR) 50 MG tablet Take 25 mg by mouth 2 (two) times daily.     . traMADol (ULTRAM) 50 MG tablet Take 50 mg by mouth every 6 (six) hours as needed for pain.     [name] Kitchen triamterene-hydrochlorothiazide (DYAZIDE) 37.5-25 MG per capsule Take 1 capsule by  mouth every morning.    [name] Kitchen acetaminophen (TYLENOL) 325 MG tablet Take 650 mg by mouth every 6 (six) hours as needed. For pain    . ADVAIR DISKUS 250-50 MCG/DOSE AEPB Inhale 1 puff into the lungs 2 (two) times daily.  0   No facility-administered medications prior to visit.   Past Medical History  Diagnosis Date  . CAD S/P percutaneous coronary angioplasty 2002, 2003,2014    a) Unstable Angina 7/'02: 1st Cutting PTCA- OM2 --> restenosed 9/'02 NSTEMI--> 3.0 mm x 12 mm BMS-OM 2; b) 11/'03 DOE w/ + Cardiolite - Cutter PTCA for ISR -- patent in 2004 (after False + Cardiolite); c) 07/2012 - Unstable Angina -- PCI to proximal RCA with Promus Premier DES 2.75 mm x 20 mm (3.0 mm), patent OM2 stent ~10% ISR (HARDING)  . Non-Q wave ST elevation myocardial infarction (STEMI) involving left circumflex coronary artery 03/2001    a) Severe thrombic ISR of prior PTCA site in OM2 --> BMS PCI; b) Echo 08/2012: Normal LV size & function.  EF 55-60%, no regional WMA, Gr 1 DD, mild MR, mild-mod TR - NO Pulmonary HTN  . Coronary stent restenosis due to scar tissue 05/2002    Cutting Balloon PTCA to OM2 BMS  . Medication intolerance - Plavix, Effient, Brilinta     Easy bruising, GI bleeds (led to Dx of  Colon CA), Brilinta - dyspnea  . Hyperlipidemia with target LDL less than 70   . Essential hypertension   . Aortic sclerosis (HCC)     With murmur  . Asthma   . Multiple lung nodules on CT 08/2012    scattered, bilateral but right>left.   . H/O Colon cancer 10/2011    GI - Drs. Carlean Purl - colonoscopy 10/2012: diverticulosis, stable ileocolic anastomosis of R colon  . Adenomatous colon polyp MAY 2013    HOSPITALIZED AT [facility] WITH HGB 7 -TRANSFUSED-MASS FOUND IN ASCENDING COLON  . Iron deficiency anemia due to chronic blood loss 10/2011    Blood Transfusion (EGD 09/2012 - unremarkable)  . Internal hemorrhoids   . Diverticulosis 10/2012    Seen on Colonoscopy  . Osteoarthritis (arthritis due to wear and tear of joints)      Bilateral Hip Arthroplasty, L Knee TKA  . DJD (degenerative joint disease), thoracolumbar     Back, Hips & Knees  . Osteoporosis   . Complication of anesthesia 2009    HIP REPLACEMENT-PT HAD HARD TIME WAKING UP--FELT LIKE SHE COULDN'T BREATHE   Past Surgical History  Procedure Laterality Date  . Appendectomy    . Cholecystectomy    . Coronary angioplasty with stent placement  2002; 2003; 07/2012    a)  7/'02: 1st Cutting PTCA- OM2 --> restenosed 9/'02 --> 3.0 mm x 12 mm BMS-OM 2; b) 11/'03 - Cutter PTCA for ISR -- patent in 2004; c) PCI to proximal RCA with Promus Premier DES 2.75 mm x 20 mm (3.0 mm), patent OM stent ~10% ISR  . Colonoscopy w/ biopsies and polypectomy  06/15/2008    adenomatous polyps, diverticulosis, internal hemorrhoids  . Total hip arthroplasty  09/08/2007    left, Dr. Wynelle Link  . Colonoscopy  11/16/2011    Procedure: COLONOSCOPY;  Surgeon: Gatha Mayer, MD;  Location: [place];  Service: Endoscopy;  Laterality: N/A;  . Replacement total knee Left 2003  . Total hip arthroplasty Right 2003  . Esophagogastroduodenoscopy N/A 10/23/2012    Procedure: ESOPHAGOGASTRODUODENOSCOPY (EGD);  Surgeon: Inda Castle, MD;  Location: [place];  Service: Endoscopy;  Laterality: N/A;  . Colonoscopy  10/2012    Gessner: diverticulosis, stable R colon ileocolic anastomosis  . Incisional hernia repair N/A 01/29/2013    Procedure: LAPAROSCOPIC INCISIONAL HERNIA;  Surgeon: Harl Bowie, MD;  Location: [facility];  Service: General;  Laterality: N/A;  . Insertion of mesh N/A 01/29/2013    Procedure: INSERTION OF MESH;  Surgeon: Harl Bowie, MD;  Location: [facility];  Service: General;  Laterality: N/A;  . Transthoracic echocardiogram  08/2012    Normal LV size & function.  EF 55-60%, no regional WMA, Gr 1 DD, mild MR, mild-mod TR; aortic sclerosis without stenosis  . Left heart catheterization with coronary angiogram N/A 07/15/2012    Procedure: LEFT HEART CATHETERIZATION WITH  CORONARY ANGIOGRAM;  Surgeon: Leonie Man, MD;  Location: [facility] CATH LAB;  Service: Cardiovascular;  Laterality: N/A;  . Percutaneous coronary stent intervention (pci-s)  07/15/2012    Procedure: PERCUTANEOUS CORONARY STENT INTERVENTION (PCI-S);  Surgeon: Leonie Man, MD;  Location: [facility] CATH LAB;  Service: Cardiovascular;;   Social History   Social History  . Marital Status: Married    Spouse Name: N/A  . Number of Children: 2  . Years of Education: N/A   Occupational History  . retired    Social History Main Topics  . Smoking status: Former Smoker -- 35  years    Types: Cigarettes    Quit date: 07/02/1982  . Smokeless tobacco: Never Used  . Alcohol Use: 0.0 oz/week    0 Standard drinks or equivalent per week     Comment: occ wine  . Drug Use: No  . Sexual Activity: Not Asked   Other Topics Concern  . None   Social History Narrative   Married, Mother of 2 adopted children. She has 3 grandchildren.   She works as a Psychologist, occupational at [employer].   She quit smoking in 1984, and does not drink alcohol.   Family History  Problem Relation Age of Onset  . Pancreatic cancer Father   . Prostate cancer Father   . Pancreatic cancer Brother   . Heart disease Mother   . Heart disease Sister   . Colon cancer Brother     ? Colostomy  . Prostate cancer Brother   . Colon cancer Sister   . Skin cancer Father   . Hypertension Mother   . Melanoma Brother    Review of Systems     Objective:   Physical Exam @BP  124/64 mmHg  Pulse 84  Ht 4' 10.75" (1.492 m)  Wt 161 lb (73.029 kg)  BMI 32.81 kg/m2@  General:  NAD Eyes:   anicteric Lungs:  clear Heart:: S1S2 no rubs, murmurs or gallops  Rectal exam with female staff present demonstrated normal anoderm normal resting tone. There were pieces of formed brown stool in the rectal vault. In the more proximal rectum there was a questionable abnormality with a firm area that could've been submucosal. It also could be hard  stool.  Abdomen:  soft and nontender, BS+   Data Reviewed:   Last colonoscopy 2014 CT scan 2014 with severe sigmoid diverticulosis. CBC was normal in March 2016.    Assessment & Plan:   1. Change in bowel habits and question of abnormal rectal exam  2. Personal history of colon cancer    I think it's probably her severe diverticulosis causing this, however are not sure if there was an abnormality on the rectal exam. Given her recent history and her prior history of colon cancer, I think a colonoscopy is appropriate.The risks and benefits as well as alternatives of endoscopic procedure(s) have been discussed and reviewed. All questions answered. The patient agrees to proceed.  I appreciate the opportunity to care for this patient. CC: Mathews Argyle, MD

## 2015-07-18 NOTE — Interval H&P Note (Signed)
History and Physical Interval Note:  07/18/2015 9:59 AM  [name]  has presented today for surgery, with the diagnosis of Change in bowels, hx of colon cancer  The various methods of treatment have been discussed with the patient and family. After consideration of risks, benefits and other options for treatment, the patient has consented to  Procedure(s): COLONOSCOPY WITH PROPOFOL (N/A) as a surgical intervention .  The patient's history has been reviewed, patient examined, no change in status, stable for surgery.  I have reviewed the patient's chart and labs.  Questions were answered to the patient's satisfaction.     Silvano Rusk

## 2015-07-18 NOTE — Transfer of Care (Signed)
Immediate Anesthesia Transfer of Care Note  Patient: [name]  Procedure(s) Performed: Procedure(s): COLONOSCOPY WITH PROPOFOL (N/A)  Patient Location: PACU and Endoscopy Unit  Anesthesia Type:MAC  Level of Consciousness: awake, oriented, patient cooperative, lethargic and responds to stimulation  Airway & Oxygen Therapy: Patient Spontanous Breathing and Patient connected to face mask oxygen  Post-op Assessment: Report given to RN, Post -op Vital signs reviewed and stable and Patient moving all extremities  Post vital signs: Reviewed and stable  Last Vitals:  Filed Vitals:   07/18/15 0903  BP: 174/77  Pulse: 109  Temp: 36.8 C  Resp: 14    Complications: No apparent anesthesia complications

## 2015-07-18 NOTE — Anesthesia Postprocedure Evaluation (Signed)
Anesthesia Post Note  Patient: [name]  Procedure(s) Performed: Procedure(s) (LRB): COLONOSCOPY WITH PROPOFOL (N/A)  Patient location during evaluation: PACU Anesthesia Type: MAC Level of consciousness: awake and alert Pain management: pain level controlled Vital Signs Assessment: post-procedure vital signs reviewed and stable Respiratory status: spontaneous breathing, nonlabored ventilation, respiratory function stable and patient connected to nasal cannula oxygen Cardiovascular status: blood pressure returned to baseline and stable Postop Assessment: no signs of nausea or vomiting Anesthetic complications: no    Last Vitals:  Filed Vitals:   07/18/15 1110 07/18/15 1120  BP: 143/77 157/78  Pulse: 90 86  Temp:    Resp: 15 18    Last Pain: There were no vitals filed for this visit.               Danitra Payano JENNETTE

## 2015-07-18 NOTE — Anesthesia Preprocedure Evaluation (Addendum)
Anesthesia Evaluation  Patient identified by MRN, date of birth, ID band Patient awake    Reviewed: Allergy & Precautions, NPO status , Patient's Chart, lab work & pertinent test results  History of Anesthesia Complications (+) history of anesthetic complications ("slow to wake up")  Airway Mallampati: II  TM Distance: >3 FB Neck ROM: Full    Dental no notable dental hx. (+) Dental Advisory Given   Pulmonary asthma , former smoker,    Pulmonary exam normal breath sounds clear to auscultation       Cardiovascular hypertension, + CAD and + Past MI  Normal cardiovascular exam Rhythm:Regular Rate:Normal     Neuro/Psych negative neurological ROS  negative psych ROS   GI/Hepatic negative GI ROS, Neg liver ROS,   Endo/Other  obesity  Renal/GU negative Renal ROS  negative genitourinary   Musculoskeletal  (+) Arthritis ,   Abdominal   Peds negative pediatric ROS (+)  Hematology negative hematology ROS (+)   Anesthesia Other Findings   Reproductive/Obstetrics negative OB ROS                            Anesthesia Physical Anesthesia Plan  ASA: III  Anesthesia Plan: MAC   Post-op Pain Management:    Induction: Intravenous  Airway Management Planned: Nasal Cannula  Additional Equipment:   Intra-op Plan:   Post-operative Plan:   Informed Consent: I have reviewed the patients History and Physical, chart, labs and discussed the procedure including the risks, benefits and alternatives for the proposed anesthesia with the patient or authorized representative who has indicated his/her understanding and acceptance.   Dental advisory given  Plan Discussed with: CRNA  Anesthesia Plan Comments:        Anesthesia Quick Evaluation

## 2015-07-18 NOTE — Op Note (Signed)
[facility] [place], [address]   COLONOSCOPY PROCEDURE REPORT  PATIENT: [name], [name]  MR#: [id number] BIRTHDATE: 1928/06/09 , 89  yrs. old GENDER: female ENDOSCOPIST: Gatha Mayer, MD, [facility] PROCEDURE DATE:  07/18/2015 PROCEDURE:   Colonoscopy, diagnostic and Colonoscopy, surveillance First Screening Colonoscopy - Avg.  risk and is 50 yrs.  old or older - No.  Prior Negative Screening - Now for repeat screening. N/A  History of Adenoma - Now for follow-up colonoscopy & has been > or = to 3 yrs.  Yes hx of adenoma.  Has been 3 or more years since last colonoscopy.  Polyps removed today? No Recommend repeat exam, <10 yrs? No ASA CLASS:   Class III INDICATIONS:Surveillance due to prior colonic neoplasia and PH Colon or Rectal Adenocarcinoma.   change in bowel habits, ? abnormal rectal exam MEDICATIONS: Monitored anesthesia care and Per Anesthesia  DESCRIPTION OF PROCEDURE:   After the risks benefits and alternatives of the procedure were thoroughly explained, informed consent was obtained.  The digital rectal exam revealed no abnormalities of the rectum.   The Pentax Ped Colon [id number] endoscope was introduced through the anus and advanced to the cecum, which was identified by both the appendix and ileocecal valve. No adverse events experienced.   The quality of the prep was good.  (MiraLax was used)  The instrument was then slowly withdrawn as the colon was fully examined. Estimated blood loss is zero unless otherwise noted in this procedure report.      COLON FINDINGS: There was severe diverticulosis noted in the left colon.   The examination was otherwise normal.   There was evidence of a prior ileocolonic surgical anastomosis in the right colon. Retroflexed views revealed no abnormalities. The time to cecum = 4.0 Withdrawal time = 4.5   The scope was withdrawn and the procedure completed. COMPLICATIONS: There were no immediate  complications.  ENDOSCOPIC IMPRESSION: 1.   Severe diverticulosis was noted in the left colon - I think this is causing constipation and change in bowel habits 2.   The examination was otherwise normal 3.   There was evidence of a prior ileocolonic surgical anastomosis in the right colon  RECOMMENDATIONS: 1.  Benefiber 2 tablespoons daily and add MiraLax daily if needed 2.  No need for routine colonoscopy - at her age  - colon cancer resected 2013  eSigned:  Gatha Mayer, MD, [facility] 07/18/2015 11:00 AM   cc: Lajean Manes, MD and The Patient

## 2015-07-18 NOTE — Discharge Instructions (Addendum)
° °  No polyps or cancer seen.  Diverticulosis is causing the constipation and bowel changes.  Please take Benefiber 2 tablespoons daily and add MiraLax daily if needed.  You may see me as needed.  I appreciate the opportunity to care for you. Gatha Mayer, MD, FACG  YOU HAD AN ENDOSCOPIC PROCEDURE TODAY: Refer to the procedure report and other information in the discharge instructions given to you for any specific questions about what was found during the examination. If this information does not answer your questions, please call Dr. Celesta Aver office at [phone] to clarify.   YOU SHOULD EXPECT: Some feelings of bloating in the abdomen. Passage of more gas than usual. Walking can help get rid of the air that was put into your GI tract during the procedure and reduce the bloating. If you had a lower endoscopy (such as a colonoscopy or flexible sigmoidoscopy) you may notice spotting of blood in your stool or on the toilet paper. Some abdominal soreness may be present for a day or two, also.  DIET: Your first meal following the procedure should be a light meal and then it is ok to progress to your normal diet. A half-sandwich or bowl of soup is an example of a good first meal. Heavy or fried foods are harder to digest and may make you feel nauseous or bloated. Drink plenty of fluids but you should avoid alcoholic beverages for 24 hours.   ACTIVITY: Your care partner should take you home directly after the procedure. You should plan to take it easy, moving slowly for the rest of the day. You can resume normal activity the day after the procedure however YOU SHOULD NOT DRIVE, use power tools, machinery or perform tasks that involve climbing or major physical exertion for 24 hours (because of the sedation medicines used during the test).   SYMPTOMS TO REPORT IMMEDIATELY: A gastroenterologist can be reached at any hour. Please call [phone]  for any of the following symptoms:  Following  lower endoscopy (colonoscopy, flexible sigmoidoscopy) Excessive amounts of blood in the stool  Significant tenderness, worsening of abdominal pains  Swelling of the abdomen that is new, acute  Fever of 100 or higher

## 2015-07-19 ENCOUNTER — Encounter (HOSPITAL_COMMUNITY): Payer: Self-pay | Admitting: Internal Medicine

## 2015-08-04 DIAGNOSIS — Z1231 Encounter for screening mammogram for malignant neoplasm of breast: Secondary | ICD-10-CM | POA: Diagnosis not present

## 2015-08-04 DIAGNOSIS — Z803 Family history of malignant neoplasm of breast: Secondary | ICD-10-CM | POA: Diagnosis not present

## 2015-08-22 DIAGNOSIS — M4806 Spinal stenosis, lumbar region: Secondary | ICD-10-CM | POA: Diagnosis not present

## 2015-08-25 ENCOUNTER — Other Ambulatory Visit: Payer: Self-pay | Admitting: Geriatric Medicine

## 2015-08-25 DIAGNOSIS — R911 Solitary pulmonary nodule: Secondary | ICD-10-CM

## 2015-09-01 ENCOUNTER — Other Ambulatory Visit: Payer: Medicare Other

## 2015-09-27 ENCOUNTER — Ambulatory Visit
Admission: RE | Admit: 2015-09-27 | Discharge: 2015-09-27 | Disposition: A | Discharge status: Home / Self Care | Payer: Medicare Other | Source: Ambulatory Visit | Attending: Geriatric Medicine | Admitting: Geriatric Medicine

## 2015-09-27 DIAGNOSIS — R911 Solitary pulmonary nodule: Secondary | ICD-10-CM

## 2015-09-29 DIAGNOSIS — M65332 Trigger finger, left middle finger: Secondary | ICD-10-CM | POA: Diagnosis not present

## 2015-09-29 DIAGNOSIS — M65342 Trigger finger, left ring finger: Secondary | ICD-10-CM | POA: Diagnosis not present

## 2015-10-13 DIAGNOSIS — N183 Chronic kidney disease, stage 3 (moderate): Secondary | ICD-10-CM | POA: Diagnosis not present

## 2015-10-13 DIAGNOSIS — H6123 Impacted cerumen, bilateral: Secondary | ICD-10-CM | POA: Diagnosis not present

## 2015-10-13 DIAGNOSIS — I129 Hypertensive chronic kidney disease with stage 1 through stage 4 chronic kidney disease, or unspecified chronic kidney disease: Secondary | ICD-10-CM | POA: Diagnosis not present

## 2015-11-01 DIAGNOSIS — H25099 Other age-related incipient cataract, unspecified eye: Secondary | ICD-10-CM | POA: Diagnosis not present

## 2015-11-01 DIAGNOSIS — I10 Essential (primary) hypertension: Secondary | ICD-10-CM | POA: Diagnosis not present

## 2015-11-01 DIAGNOSIS — H5203 Hypermetropia, bilateral: Secondary | ICD-10-CM | POA: Diagnosis not present

## 2015-11-01 DIAGNOSIS — H524 Presbyopia: Secondary | ICD-10-CM | POA: Diagnosis not present

## 2015-11-01 DIAGNOSIS — H52222 Regular astigmatism, left eye: Secondary | ICD-10-CM | POA: Diagnosis not present

## 2015-11-01 DIAGNOSIS — H1045 Other chronic allergic conjunctivitis: Secondary | ICD-10-CM | POA: Diagnosis not present

## 2015-11-23 DIAGNOSIS — M12812 Other specific arthropathies, not elsewhere classified, left shoulder: Secondary | ICD-10-CM | POA: Diagnosis not present

## 2015-11-23 DIAGNOSIS — M12811 Other specific arthropathies, not elsewhere classified, right shoulder: Secondary | ICD-10-CM | POA: Diagnosis not present

## 2016-02-24 DIAGNOSIS — H938X1 Other specified disorders of right ear: Secondary | ICD-10-CM | POA: Diagnosis not present

## 2016-02-24 DIAGNOSIS — Z974 Presence of external hearing-aid: Secondary | ICD-10-CM | POA: Diagnosis not present

## 2016-02-24 DIAGNOSIS — J302 Other seasonal allergic rhinitis: Secondary | ICD-10-CM | POA: Diagnosis not present

## 2016-02-24 DIAGNOSIS — H903 Sensorineural hearing loss, bilateral: Secondary | ICD-10-CM | POA: Diagnosis not present

## 2016-03-02 DIAGNOSIS — M65342 Trigger finger, left ring finger: Secondary | ICD-10-CM | POA: Diagnosis not present

## 2016-03-02 DIAGNOSIS — M65332 Trigger finger, left middle finger: Secondary | ICD-10-CM | POA: Diagnosis not present

## 2016-04-18 DIAGNOSIS — Z23 Encounter for immunization: Secondary | ICD-10-CM | POA: Diagnosis not present

## 2016-04-25 DIAGNOSIS — H9201 Otalgia, right ear: Secondary | ICD-10-CM | POA: Diagnosis not present

## 2016-04-25 DIAGNOSIS — N183 Chronic kidney disease, stage 3 (moderate): Secondary | ICD-10-CM | POA: Diagnosis not present

## 2016-04-25 DIAGNOSIS — I129 Hypertensive chronic kidney disease with stage 1 through stage 4 chronic kidney disease, or unspecified chronic kidney disease: Secondary | ICD-10-CM | POA: Diagnosis not present

## 2016-04-25 DIAGNOSIS — Z1389 Encounter for screening for other disorder: Secondary | ICD-10-CM | POA: Diagnosis not present

## 2016-04-25 DIAGNOSIS — Z79899 Other long term (current) drug therapy: Secondary | ICD-10-CM | POA: Diagnosis not present

## 2016-04-25 DIAGNOSIS — E7800 Pure hypercholesterolemia, unspecified: Secondary | ICD-10-CM | POA: Diagnosis not present

## 2016-04-25 DIAGNOSIS — D692 Other nonthrombocytopenic purpura: Secondary | ICD-10-CM | POA: Diagnosis not present

## 2016-04-25 DIAGNOSIS — Z0000 Encounter for general adult medical examination without abnormal findings: Secondary | ICD-10-CM | POA: Diagnosis not present

## 2016-05-03 DIAGNOSIS — B351 Tinea unguium: Secondary | ICD-10-CM | POA: Diagnosis not present

## 2016-05-03 DIAGNOSIS — M6588 Other synovitis and tenosynovitis, other site: Secondary | ICD-10-CM | POA: Diagnosis not present

## 2016-05-03 DIAGNOSIS — M2041 Other hammer toe(s) (acquired), right foot: Secondary | ICD-10-CM | POA: Diagnosis not present

## 2016-09-11 DIAGNOSIS — Z803 Family history of malignant neoplasm of breast: Secondary | ICD-10-CM | POA: Diagnosis not present

## 2016-09-11 DIAGNOSIS — Z1231 Encounter for screening mammogram for malignant neoplasm of breast: Secondary | ICD-10-CM | POA: Diagnosis not present

## 2016-09-14 DIAGNOSIS — M1711 Unilateral primary osteoarthritis, right knee: Secondary | ICD-10-CM | POA: Diagnosis not present

## 2016-09-15 ENCOUNTER — Emergency Department (HOSPITAL_COMMUNITY): Payer: Medicare Other

## 2016-09-15 ENCOUNTER — Observation Stay (HOSPITAL_COMMUNITY)
Admission: EM | Admit: 2016-09-15 | Discharge: 2016-09-16 | Disposition: A | Discharge status: Home / Self Care | Payer: Medicare Other | Attending: Internal Medicine | Admitting: Internal Medicine

## 2016-09-15 ENCOUNTER — Encounter (HOSPITAL_COMMUNITY): Payer: Self-pay | Admitting: Nurse Practitioner

## 2016-09-15 DIAGNOSIS — C182 Malignant neoplasm of ascending colon: Secondary | ICD-10-CM | POA: Diagnosis present

## 2016-09-15 DIAGNOSIS — D72825 Bandemia: Secondary | ICD-10-CM | POA: Diagnosis not present

## 2016-09-15 DIAGNOSIS — I700 Atherosclerosis of aorta: Secondary | ICD-10-CM | POA: Insufficient documentation

## 2016-09-15 DIAGNOSIS — R918 Other nonspecific abnormal finding of lung field: Secondary | ICD-10-CM | POA: Insufficient documentation

## 2016-09-15 DIAGNOSIS — R072 Precordial pain: Secondary | ICD-10-CM | POA: Diagnosis present

## 2016-09-15 DIAGNOSIS — D509 Iron deficiency anemia, unspecified: Secondary | ICD-10-CM | POA: Diagnosis not present

## 2016-09-15 DIAGNOSIS — Z6830 Body mass index (BMI) 30.0-30.9, adult: Secondary | ICD-10-CM | POA: Diagnosis not present

## 2016-09-15 DIAGNOSIS — I10 Essential (primary) hypertension: Secondary | ICD-10-CM | POA: Diagnosis not present

## 2016-09-15 DIAGNOSIS — I2510 Atherosclerotic heart disease of native coronary artery without angina pectoris: Secondary | ICD-10-CM | POA: Diagnosis not present

## 2016-09-15 DIAGNOSIS — Z87891 Personal history of nicotine dependence: Secondary | ICD-10-CM | POA: Insufficient documentation

## 2016-09-15 DIAGNOSIS — Z955 Presence of coronary angioplasty implant and graft: Secondary | ICD-10-CM | POA: Diagnosis not present

## 2016-09-15 DIAGNOSIS — Z8249 Family history of ischemic heart disease and other diseases of the circulatory system: Secondary | ICD-10-CM | POA: Diagnosis not present

## 2016-09-15 DIAGNOSIS — M1990 Unspecified osteoarthritis, unspecified site: Secondary | ICD-10-CM | POA: Insufficient documentation

## 2016-09-15 DIAGNOSIS — Z85038 Personal history of other malignant neoplasm of large intestine: Secondary | ICD-10-CM | POA: Insufficient documentation

## 2016-09-15 DIAGNOSIS — M25511 Pain in right shoulder: Secondary | ICD-10-CM | POA: Insufficient documentation

## 2016-09-15 DIAGNOSIS — M810 Age-related osteoporosis without current pathological fracture: Secondary | ICD-10-CM | POA: Insufficient documentation

## 2016-09-15 DIAGNOSIS — I252 Old myocardial infarction: Secondary | ICD-10-CM | POA: Diagnosis not present

## 2016-09-15 DIAGNOSIS — J45909 Unspecified asthma, uncomplicated: Secondary | ICD-10-CM | POA: Diagnosis present

## 2016-09-15 DIAGNOSIS — Z7982 Long term (current) use of aspirin: Secondary | ICD-10-CM | POA: Insufficient documentation

## 2016-09-15 DIAGNOSIS — E785 Hyperlipidemia, unspecified: Secondary | ICD-10-CM | POA: Diagnosis not present

## 2016-09-15 DIAGNOSIS — Z882 Allergy status to sulfonamides status: Secondary | ICD-10-CM | POA: Insufficient documentation

## 2016-09-15 DIAGNOSIS — R928 Other abnormal and inconclusive findings on diagnostic imaging of breast: Secondary | ICD-10-CM | POA: Diagnosis not present

## 2016-09-15 DIAGNOSIS — Z9861 Coronary angioplasty status: Secondary | ICD-10-CM

## 2016-09-15 DIAGNOSIS — R079 Chest pain, unspecified: Secondary | ICD-10-CM | POA: Diagnosis not present

## 2016-09-15 DIAGNOSIS — D72829 Elevated white blood cell count, unspecified: Secondary | ICD-10-CM | POA: Diagnosis present

## 2016-09-15 DIAGNOSIS — R0789 Other chest pain: Secondary | ICD-10-CM | POA: Diagnosis not present

## 2016-09-15 DIAGNOSIS — M25512 Pain in left shoulder: Secondary | ICD-10-CM | POA: Diagnosis not present

## 2016-09-15 DIAGNOSIS — J4520 Mild intermittent asthma, uncomplicated: Secondary | ICD-10-CM | POA: Diagnosis not present

## 2016-09-15 DIAGNOSIS — E669 Obesity, unspecified: Secondary | ICD-10-CM | POA: Diagnosis not present

## 2016-09-15 DIAGNOSIS — R0602 Shortness of breath: Secondary | ICD-10-CM | POA: Diagnosis not present

## 2016-09-15 LAB — CBC
HCT: 38.6 % (ref 36.0–46.0)
HEMOGLOBIN: 12.9 g/dL (ref 12.0–15.0)
MCH: 30.6 pg (ref 26.0–34.0)
MCHC: 33.4 g/dL (ref 30.0–36.0)
MCV: 91.5 fL (ref 78.0–100.0)
Platelets: 280 10*3/uL (ref 150–400)
RBC: 4.22 MIL/uL (ref 3.87–5.11)
RDW: 13.3 % (ref 11.5–15.5)
WBC: 20.2 10*3/uL — ABNORMAL HIGH (ref 4.0–10.5)

## 2016-09-15 LAB — CBC WITH DIFFERENTIAL/PLATELET
Basophils Absolute: 0 10*3/uL (ref 0.0–0.1)
Basophils Relative: 0 %
EOS PCT: 0 %
Eosinophils Absolute: 0 10*3/uL (ref 0.0–0.7)
HCT: 34.2 % — ABNORMAL LOW (ref 36.0–46.0)
Hemoglobin: 11.6 g/dL — ABNORMAL LOW (ref 12.0–15.0)
LYMPHS ABS: 1.2 10*3/uL (ref 0.7–4.0)
LYMPHS PCT: 7 %
MCH: 30.9 pg (ref 26.0–34.0)
MCHC: 33.9 g/dL (ref 30.0–36.0)
MCV: 91.2 fL (ref 78.0–100.0)
MONO ABS: 1 10*3/uL (ref 0.1–1.0)
Monocytes Relative: 6 %
Neutro Abs: 16.2 10*3/uL — ABNORMAL HIGH (ref 1.7–7.7)
Neutrophils Relative %: 87 %
PLATELETS: 242 10*3/uL (ref 150–400)
RBC: 3.75 MIL/uL — ABNORMAL LOW (ref 3.87–5.11)
RDW: 13.3 % (ref 11.5–15.5)
WBC: 18.4 10*3/uL — ABNORMAL HIGH (ref 4.0–10.5)

## 2016-09-15 LAB — URINALYSIS, ROUTINE W REFLEX MICROSCOPIC
Bacteria, UA: NONE SEEN
Bilirubin Urine: NEGATIVE
Glucose, UA: NEGATIVE mg/dL
HGB URINE DIPSTICK: NEGATIVE
Ketones, ur: NEGATIVE mg/dL
NITRITE: NEGATIVE
PH: 5 (ref 5.0–8.0)
Protein, ur: NEGATIVE mg/dL
SPECIFIC GRAVITY, URINE: 1.017 (ref 1.005–1.030)

## 2016-09-15 LAB — I-STAT TROPONIN, ED: TROPONIN I, POC: 0.01 ng/mL (ref 0.00–0.08)

## 2016-09-15 LAB — BASIC METABOLIC PANEL
ANION GAP: 13 (ref 5–15)
BUN: 23 mg/dL — ABNORMAL HIGH (ref 6–20)
CALCIUM: 9.9 mg/dL (ref 8.9–10.3)
CO2: 22 mmol/L (ref 22–32)
Chloride: 100 mmol/L — ABNORMAL LOW (ref 101–111)
Creatinine, Ser: 1.13 mg/dL — ABNORMAL HIGH (ref 0.44–1.00)
GFR calc Af Amer: 50 mL/min — ABNORMAL LOW (ref >60)
GFR calc non Af Amer: 43 mL/min — ABNORMAL LOW (ref >60)
Glucose, Bld: 256 mg/dL — ABNORMAL HIGH (ref 65–99)
Potassium: 3.8 mmol/L (ref 3.5–5.1)
SODIUM: 135 mmol/L (ref 135–145)

## 2016-09-15 MED ORDER — MORPHINE SULFATE (PF) 2 MG/ML IV SOLN: 2.0000 mg | INTRAVENOUS | Status: DC | PRN

## 2016-09-15 MED ORDER — ENOXAPARIN SODIUM 40 MG/0.4ML ~~LOC~~ SOLN: 40.0000 mg | SUBCUTANEOUS | Status: DC

## 2016-09-15 MED ORDER — ONDANSETRON HCL 4 MG/2ML IJ SOLN: 4.0000 mg | Freq: Four times a day (QID) | INTRAMUSCULAR | Status: DC | PRN

## 2016-09-15 MED ORDER — TRAMADOL HCL 50 MG PO TABS
50.0000 mg | ORAL_TABLET | Freq: Four times a day (QID) | ORAL | Status: DC | PRN
  Administered 2016-09-16: 50 mg via ORAL
  Filled 2016-09-15: qty 1

## 2016-09-15 MED ORDER — METOPROLOL TARTRATE 25 MG PO TABS
25.0000 mg | ORAL_TABLET | Freq: Two times a day (BID) | ORAL | Status: DC
  Administered 2016-09-15 – 2016-09-16 (×2): 25 mg via ORAL
  Filled 2016-09-15 (×2): qty 1

## 2016-09-15 MED ORDER — ASPIRIN EC 81 MG PO TBEC
81.0000 mg | DELAYED_RELEASE_TABLET | Freq: Every day | ORAL | Status: DC
  Administered 2016-09-16: 81 mg via ORAL
  Filled 2016-09-15: qty 1

## 2016-09-15 MED ORDER — ACETAMINOPHEN 325 MG PO TABS
650.0000 mg | ORAL_TABLET | ORAL | Status: DC | PRN
  Administered 2016-09-16: 650 mg via ORAL
  Filled 2016-09-15: qty 2

## 2016-09-15 MED ORDER — ALBUTEROL SULFATE (2.5 MG/3ML) 0.083% IN NEBU: 2.5000 mg | INHALATION_SOLUTION | RESPIRATORY_TRACT | Status: DC | PRN

## 2016-09-15 NOTE — ED Provider Notes (Signed)
[facility] DEPT Provider Note   CSN: [id number] Arrival date & time: 09/15/16  1718     History   Chief Complaint Chief Complaint  Patient presents with  . Chest Pain    HPI [name] is a 83 y.o. female.  Patient presents with shortness of breath and chest pain. Patient does have significant cardiac history with multiple stents and workups. Patient denies any recent cardiac catheterization. Patient had an and STEMI in the past in 2002. Patient's had coronary angioplasty in 2014. Patient has mild asthma controlled. Patient had abdominal aortic aneurysm repair in 2015. Patient today had an episode approximately one hour of significant shortness of breath not exertional related. No history of similar. Patient had mild upper chest discomfort toward her neck which is very minimal currently. Patient has mild left shoulder discomfort however she's had that in the past.      Past Medical History:  Diagnosis Date  . Adenomatous colon polyp MAY 2013   HOSPITALIZED AT [facility] WITH HGB 7 -TRANSFUSED-MASS FOUND IN ASCENDING COLON  . Aortic sclerosis    With murmur  . Asthma   . CAD S/P percutaneous coronary angioplasty 2002, 2003,2014   a) Unstable Angina 7/'02: 1st Cutting PTCA- OM2 --> restenosed 9/'02 NSTEMI--> 3.0 mm x 12 mm BMS-OM 2; b) 11/'03 DOE w/ + Cardiolite - Cutter PTCA for ISR -- patent in 2004 (after False + Cardiolite); c) 07/2012 - Unstable Angina -- PCI to proximal RCA with Promus Premier DES 2.75 mm x 20 mm (3.0 mm), patent OM2 stent ~10% ISR (HARDING)  . Complication of anesthesia 2009   HIP REPLACEMENT-PT HAD HARD TIME WAKING UP--FELT [place]  . Coronary stent restenosis due to scar tissue 05/2002   Cutting Balloon PTCA to OM2 BMS  . Diverticulosis 10/2012   Seen on Colonoscopy  . DJD (degenerative joint disease), thoracolumbar    Back, Hips & Knees  . Essential hypertension   . H/O Colon cancer 10/2011   GI - Drs. Carlean Purl - colonoscopy 10/2012:  diverticulosis, stable ileocolic anastomosis of R colon  . Hyperlipidemia with target LDL less than 70   . Internal hemorrhoids   . Iron deficiency anemia due to chronic blood loss 10/2011   Blood Transfusion (EGD 09/2012 - unremarkable)  . Medication intolerance - Plavix, Effient, Brilinta    Easy bruising, GI bleeds (led to Dx of Colon CA), Brilinta - dyspnea  . Multiple lung nodules on CT 08/2012   scattered, bilateral but right>left.   . Non-Q wave ST elevation myocardial infarction (STEMI) involving left circumflex coronary artery 03/2001   a) Severe thrombic ISR of prior PTCA site in OM2 --> BMS PCI; b) Echo 08/2012: Normal LV size & function.  EF 55-60%, no regional WMA, Gr 1 DD, mild MR, mild-mod TR - NO Pulmonary HTN  . Osteoarthritis (arthritis due to wear and tear of joints)    Bilateral Hip Arthroplasty, L Knee TKA  . Osteoporosis     Patient Active Problem List   Diagnosis Date Noted  . Change in bowel habits   . History of colon cancer   . Chest pain 09/19/2014  . Obesity (BMI 30-39.9) 03/14/2014  . Medication intolerance - Plavix, Effient, Brilinta   . Hyperlipidemia with target LDL less than 70   . Essential hypertension   . Diverticulosis of colon without hemorrhage 03/16/2013  . Incisional hernia, without obstruction or gangrene 11/28/2012  . Asthma 10/22/2012  . Carcinoma of ascending colon ([place]) 11/16/2011  .  PERSONAL HX COLONIC POLYPS 11/29/2009  . S/P NSTEMI -- involving left circumflex coronary artery (OM2) In-stent Thrombosis 03/02/2001  . CAD S/P PCI: OM2 PTCA 7/02, OM2 Stent 9/02, ISR 11/03 - PTCA; RCA DES 1 /14/14 12/31/2000    Past Surgical History:  Procedure Laterality Date  . APPENDECTOMY    . CHOLECYSTECTOMY    . COLONOSCOPY  11/16/2011   Procedure: COLONOSCOPY;  Surgeon: Gatha Mayer, MD;  Location: [facility];  Service: Endoscopy;  Laterality: N/A;  . COLONOSCOPY  10/2012   [place]: diverticulosis, stable R colon ileocolic anastomosis  .  COLONOSCOPY W/ BIOPSIES AND POLYPECTOMY  06/15/2008   adenomatous polyps, diverticulosis, internal hemorrhoids  . COLONOSCOPY WITH PROPOFOL N/A 07/18/2015   Procedure: COLONOSCOPY WITH PROPOFOL;  Surgeon: Gatha Mayer, MD;  Location: [facility];  Service: Endoscopy;  Laterality: N/A;  . CORONARY ANGIOPLASTY WITH STENT PLACEMENT  2002; 2003; 07/2012   a)  7/'02: 1st Cutting PTCA- OM2 --> restenosed 9/'02 --> 3.0 mm x 12 mm BMS-OM 2; b) 11/'03 - Cutter PTCA for ISR -- patent in 2004; c) PCI to proximal RCA with Promus Premier DES 2.75 mm x 20 mm (3.0 mm), patent OM stent ~10% ISR  . ESOPHAGOGASTRODUODENOSCOPY N/A 10/23/2012   Procedure: ESOPHAGOGASTRODUODENOSCOPY (EGD);  Surgeon: Inda Castle, MD;  Location: [facility];  Service: Endoscopy;  Laterality: N/A;  . INCISIONAL HERNIA REPAIR N/A 01/29/2013   Procedure: LAPAROSCOPIC INCISIONAL HERNIA;  Surgeon: Harl Bowie, MD;  Location: [facility];  Service: General;  Laterality: N/A;  . INSERTION OF MESH N/A 01/29/2013   Procedure: INSERTION OF MESH;  Surgeon: Harl Bowie, MD;  Location: [facility];  Service: General;  Laterality: N/A;  . LEFT HEART CATHETERIZATION WITH CORONARY ANGIOGRAM N/A 07/15/2012   Procedure: LEFT HEART CATHETERIZATION WITH CORONARY ANGIOGRAM;  Surgeon: Leonie Man, MD;  Location: [facility] CATH LAB;  Service: Cardiovascular;  Laterality: N/A;  . PERCUTANEOUS CORONARY STENT INTERVENTION (PCI-S)  07/15/2012   Procedure: PERCUTANEOUS CORONARY STENT INTERVENTION (PCI-S);  Surgeon: Leonie Man, MD;  Location: [facility] C[facility] LAB;  Service: Cardiovascular;;  . REPLACEMENT TOTAL KNEE Left 2003  . TOTAL HIP ARTHROPLASTY  09/08/2007   left, Dr. Wynelle Link  . TOTAL HIP ARTHROPLASTY Right 2003  . TRANSTHORACIC ECHOCARDIOGRAM  08/2012   Normal LV size & function.  EF 55-60%, no regional WMA, Gr 1 DD, mild MR, mild-mod TR; aortic sclerosis without stenosis    OB History    No data available       Home Medications    Prior to  Admission medications   Medication Sig Start Date End Date Taking? Authorizing Provider  albuterol (PROVENTIL HFA;VENTOLIN HFA) 108 (90 BASE) MCG/ACT inhaler Inhale 2 puffs into the lungs every 6 (six) hours as needed for wheezing.    Historical Provider, MD  aspirin EC 81 MG tablet Take 81 mg by mouth daily.    Historical Provider, MD  Biotin 5000 MCG CAPS Take 5,000 mcg by mouth daily.    Historical Provider, MD  calcium citrate-vitamin D (CITRACAL+D) 315-200 MG-UNIT per tablet Take 0.5 tablets by mouth daily.     Historical Provider, MD  CALCIUM/MAGNESIUM/ZINC FORMULA PO Take 1 tablet by mouth daily.    Historical Provider, MD  cetirizine (ZYRTEC) 10 MG tablet Take 5 mg by mouth 2 (two) times daily.    Historical Provider, MD  lovastatin (MEVACOR) 20 MG tablet Take 1 tablet by mouth daily. 08/23/14   Historical Provider, MD  metoprolol (LOPRESSOR) 50 MG tablet  Take 25 mg by mouth 2 (two) times daily.     Historical Provider, MD  polyethylene glycol powder (MIRALAX) powder 1 tablespoon or capful in 6 oz water as needed 07/18/15   Gatha Mayer, MD  traMADol (ULTRAM) 50 MG tablet Take 50 mg by mouth every 6 (six) hours as needed for moderate pain.  07/01/15   Historical Provider, MD  triamterene-hydrochlorothiazide (DYAZIDE) 37.5-25 MG per capsule Take 1 capsule by mouth every morning.    Historical Provider, MD  TURMERIC PO Take 1 tablet by mouth daily.    Historical Provider, MD  Wheat Dextrin (BENEFIBER) POWD 2 tablespoons daily 07/18/15   Gatha Mayer, MD    Family History Family History  Problem Relation Age of Onset  . Pancreatic cancer Father   . Prostate cancer Father   . Pancreatic cancer Brother   . Heart disease Mother   . Heart disease Sister   . Colon cancer Brother     ? Colostomy  . Prostate cancer Brother   . Colon cancer Sister   . Skin cancer Father   . Hypertension Mother   . Melanoma Brother     Social History Social History  Substance Use Topics  . Smoking  status: Former Smoker    Years: 30.00    Types: Cigarettes    Quit date: 07/02/1982  . Smokeless tobacco: Never Used  . Alcohol use 0.0 oz/week     Comment: occ wine     Allergies   Atorvastatin; Brilinta [ticagrelor]; Clopidogrel bisulfate; Codeine; and Sulfonamide derivatives   Review of Systems Review of Systems  Constitutional: Negative for chills and fever.  HENT: Negative for congestion.   Eyes: Negative for visual disturbance.  Respiratory: Positive for shortness of breath.   Cardiovascular: Positive for chest pain.  Gastrointestinal: Negative for abdominal pain and vomiting.  Genitourinary: Negative for dysuria and flank pain.  Musculoskeletal: Negative for back pain, neck pain and neck stiffness.  Skin: Negative for rash.  Neurological: Negative for light-headedness and headaches.     Physical Exam Updated Vital Signs BP 136/75   Pulse 96   Temp 98.2 F (36.8 C) (Oral)   Resp 18   SpO2 98%   Physical Exam  Constitutional: She is oriented to person, place, and time. She appears well-developed and well-nourished.  HENT:  Head: Normocephalic and atraumatic.  Eyes: Conjunctivae are normal. Right eye exhibits no discharge. Left eye exhibits no discharge.  Neck: Normal range of motion. Neck supple. No tracheal deviation present.  Cardiovascular: Normal rate and regular rhythm.   Pulmonary/Chest: Effort normal. She has wheezes (minimal upper).  Abdominal: Soft. She exhibits no distension. There is no tenderness. There is no guarding.  Musculoskeletal: She exhibits no edema.  Neurological: She is alert and oriented to person, place, and time. No cranial nerve deficit.  Skin: Skin is warm. No rash noted.  Psychiatric: She has a normal mood and affect.  Nursing note and vitals reviewed.    ED Treatments / Results  Labs (all labs ordered are listed, but only abnormal results are displayed) Labs Reviewed  BASIC METABOLIC PANEL - Abnormal; Notable for the  following:       Result Value   Chloride 100 (*)    Glucose, Bld 256 (*)    BUN 23 (*)    Creatinine, Ser 1.13 (*)    GFR calc non Af Amer 43 (*)    GFR calc Af Amer 50 (*)    All other components within  normal limits  CBC - Abnormal; Notable for the following:    WBC 20.2 (*)    All other components within normal limits  I-STAT TROPOININ, ED    EKG  EKG Interpretation  Date/Time:  Saturday September 15 2016 17:23:33 EDT Ventricular Rate:  110 PR Interval:  152 QRS Duration: 72 QT Interval:  316 QTC Calculation: 427 R Axis:   36 Text Interpretation:  Sinus tachycardia Low voltage QRS Cannot rule out Anterior infarct , age undetermined Abnormal ECG Confirmed by Reather Converse MD, Sundae Maners [phone]) on 09/15/2016 6:52:01 PM       Radiology Dg Chest 2 View  Result Date: 09/15/2016 CLINICAL DATA:  Shortness of breath and chest pain. EXAM: CHEST  2 VIEW COMPARISON:  CT of the chest 09/27/2015 FINDINGS: Cardiomediastinal silhouette is normal. Mediastinal contours appear intact. Tortuosity and calcific atherosclerotic disease of the aorta present. There is no evidence of focal airspace consolidation, pleural effusion or pneumothorax. Osseous structures are without acute abnormality. Bilateral severe arthropathy of the glenohumeral joints. Soft tissues are grossly normal. IMPRESSION: No active cardiopulmonary disease. Electronically Signed   By: Fidela Salisbury M.D.   On: 09/15/2016 18:26    Procedures Procedures (including critical care time)  Medications Ordered in ED Medications - No data to display   Initial Impression / Assessment and Plan / ED Course  I have reviewed the triage vital signs and the nursing notes.  Pertinent labs & imaging results that were available during my care of the patient were reviewed by me and considered in my medical decision making (see chart for details).    Patient presents after episode of shortness of breath. It resolved and now patient has mild  symptoms. Patient has not had a cough or fever however she does have a leukocytosis without specific cause she is not on steroids and no stress response suspected. Concern clinically for ACS equivalent with her cardiac history. Plan for CT the chest look for occult pneumonia if unremarkable plan for observation telemetry for further workup of her heart.    Final Clinical Impressions(s) / ED Diagnoses   Final diagnoses:  Chest pain, precordial  Leukocytosis, unspecified type    New Prescriptions New Prescriptions   No medications on file     Elnora Morrison, MD 09/17/16 0013

## 2016-09-15 NOTE — H&P (Addendum)
[name] FHL:[id number] DOB: 05/28/1928 DOA: 09/15/2016     PCP: Mathews Argyle, MD   Outpatient Specialists: CARDIOLOGY Ellyn Hack, GI [facility] Patient coming from:    home Lives   With family    Chief Complaint: chest pressure  HPI: [name] is a 83 y.o. female with medical history significant of CAD  HLD, HTN, right-sided colon cancer, asthma, iron deficiency anemia  Presented with upper Chest pain aching in her chest/neck and left shoulder associated with Shortness of breath. Off and on for 1 hour. Would get better with rest.  Started today while lunchtime during her baseline activities. Some associated shortness of breath no associated fevers cough that urinary complaints she used aspirin heat packs with some improvement. denies any pleuritic chest pain.   Currently improved.  Reports had Cortisone shoot into her knee yesterday.  2-3 weeks ago she had some cough but it has resolved.   Regarding pertinent Chronic problems: hx of CAD followed by Cardiology hx CAD-ASCAD with 7/'02: 1st Cutting PTCA- OM2 --> restenosed 9/'02 NSTEMI--> 3.0 mm x 12 mm BMS-OM 2; b) 11/'03 DOE w/ + Cardiolite - Cutter PTCA for ISR -- patent in 2004 (after False + Cardiolite); c) 07/2012 - Unstable Angina -- PCI to proximal RCA with Promus Premier DES 2.75 mm x 20 mm (3.0 mm), patent OM2 stent ~10% ISR (HARDING)-   Last nuclear stress test was in March  2016 AAA sp repair in 2015 History of colon cancer status post resection 2013 note on colonoscopy in January 2017 by Dr. Carlean Purl  IN ER:  Temp (24hrs), Avg:98.2 F (36.8 C), Min:98.2 F (36.8 C), Max:98.2 F (36.8 C)      HR 90 BP 134/65 Trop 0.01 NA 135 K 3.8 Cr 1.13 WBC 20.2 last WBC 7.4 in 2016 Hg 12.9  CT Chest: non-acute Atherosclerosis. Multiple small nodules  Following Medications were ordered in ER: Medications - No data to display    Hospitalist was called for admission for chest pain evaluation and  leukocytosis  Review of Systems:    Pertinent positives include: chest pain  Constitutional:  No weight loss, night sweats, Fevers, chills, fatigue, weight loss  HEENT:  No headaches, Difficulty swallowing,Tooth/dental problems,Sore throat,  No sneezing, itching, ear ache, nasal congestion, post nasal drip,  Cardio-vascular:  No , Orthopnea, PND, anasarca, dizziness, palpitations.no Bilateral lower extremity swelling  GI:  No heartburn, indigestion, abdominal pain, nausea, vomiting, diarrhea, change in bowel habits, loss of appetite, melena, blood in stool, hematemesis Resp:  no shortness of breath at rest. No dyspnea on exertion, No excess mucus, no productive cough, No non-productive cough, No coughing up of blood.No change in color of mucus.No wheezing. Skin:  no rash or lesions. No jaundice GU:  no dysuria, change in color of urine, no urgency or frequency. No straining to urinate.  No flank pain.  Musculoskeletal:  No joint pain or no joint swelling. No decreased range of motion. No back pain.  Psych:  No change in mood or affect. No depression or anxiety. No memory loss.  Neuro: no localizing neurological complaints, no tingling, no weakness, no double vision, no gait abnormality, no slurred speech, no confusion  As per HPI otherwise 10 point review of systems negative.   Past Medical History: Past Medical History:  Diagnosis Date  . Adenomatous colon polyp MAY 2013   HOSPITALIZED AT [facility] WITH HGB 7 -TRANSFUSED-MASS FOUND IN ASCENDING COLON  . Aortic sclerosis    With murmur  .  Asthma   . CAD S/P percutaneous coronary angioplasty 2002, 2003,2014   a) Unstable Angina 7/'02: 1st Cutting PTCA- OM2 --> restenosed 9/'02 NSTEMI--> 3.0 mm x 12 mm BMS-OM 2; b) 11/'03 DOE w/ + Cardiolite - Cutter PTCA for ISR -- patent in 2004 (after False + Cardiolite); c) 07/2012 - Unstable Angina -- PCI to proximal RCA with Promus Premier DES 2.75 mm x 20 mm (3.0 mm), patent OM2 stent ~10% ISR  (HARDING)  . Complication of anesthesia 2009   HIP REPLACEMENT-PT HAD HARD TIME WAKING UP--FELT [place]  . Coronary stent restenosis due to scar tissue 05/2002   Cutting Balloon PTCA to OM2 BMS  . Diverticulosis 10/2012   Seen on Colonoscopy  . DJD (degenerative joint disease), thoracolumbar    Back, Hips & Knees  . Essential hypertension   . H/O Colon cancer 10/2011   GI - Drs. Carlean Purl - colonoscopy 10/2012: diverticulosis, stable ileocolic anastomosis of R colon  . Hyperlipidemia with target LDL less than 70   . Internal hemorrhoids   . Iron deficiency anemia due to chronic blood loss 10/2011   Blood Transfusion (EGD 09/2012 - unremarkable)  . Medication intolerance - Plavix, Effient, Brilinta    Easy bruising, GI bleeds (led to Dx of Colon CA), Brilinta - dyspnea  . Multiple lung nodules on CT 08/2012   scattered, bilateral but right>left.   . Non-Q wave ST elevation myocardial infarction (STEMI) involving left circumflex coronary artery 03/2001   a) Severe thrombic ISR of prior PTCA site in OM2 --> BMS PCI; b) Echo 08/2012: Normal LV size & function.  EF 55-60%, no regional WMA, Gr 1 DD, mild MR, mild-mod TR - NO Pulmonary HTN  . Osteoarthritis (arthritis due to wear and tear of joints)    Bilateral Hip Arthroplasty, L Knee TKA  . Osteoporosis    Past Surgical History:  Procedure Laterality Date  . APPENDECTOMY    . CHOLECYSTECTOMY    . COLONOSCOPY  11/16/2011   Procedure: COLONOSCOPY;  Surgeon: Gatha Mayer, MD;  Location: [place];  Service: Endoscopy;  Laterality: N/A;  . COLONOSCOPY  10/2012   [facility]: diverticulosis, stable R colon ileocolic anastomosis  . COLONOSCOPY W/ BIOPSIES AND POLYPECTOMY  06/15/2008   adenomatous polyps, diverticulosis, internal hemorrhoids  . COLONOSCOPY WITH PROPOFOL N/A 07/18/2015   Procedure: COLONOSCOPY WITH PROPOFOL;  Surgeon: Gatha Mayer, MD;  Location: [facility];  Service: Endoscopy;  Laterality: N/A;  . CORONARY  ANGIOPLASTY WITH STENT PLACEMENT  2002; 2003; 07/2012   a)  7/'02: 1st Cutting PTCA- OM2 --> restenosed 9/'02 --> 3.0 mm x 12 mm BMS-OM 2; b) 11/'03 - Cutter PTCA for ISR -- patent in 2004; c) PCI to proximal RCA with Promus Premier DES 2.75 mm x 20 mm (3.0 mm), patent OM stent ~10% ISR  . ESOPHAGOGASTRODUODENOSCOPY N/A 10/23/2012   Procedure: ESOPHAGOGASTRODUODENOSCOPY (EGD);  Surgeon: Inda Castle, MD;  Location: [place];  Service: Endoscopy;  Laterality: N/A;  . INCISIONAL HERNIA REPAIR N/A 01/29/2013   Procedure: LAPAROSCOPIC INCISIONAL HERNIA;  Surgeon: Harl Bowie, MD;  Location: [facility];  Service: General;  Laterality: N/A;  . INSERTION OF MESH N/A 01/29/2013   Procedure: INSERTION OF MESH;  Surgeon: Harl Bowie, MD;  Location: [facility];  Service: General;  Laterality: N/A;  . LEFT HEART CATHETERIZATION WITH CORONARY ANGIOGRAM N/A 07/15/2012   Procedure: LEFT HEART CATHETERIZATION WITH CORONARY ANGIOGRAM;  Surgeon: Leonie Man, MD;  Location: [facility] CATH LAB;  Service: Cardiovascular;  Laterality: N/A;  . PERCUTANEOUS CORONARY STENT INTERVENTION (PCI-S)  07/15/2012   Procedure: PERCUTANEOUS CORONARY STENT INTERVENTION (PCI-S);  Surgeon: Leonie Man, MD;  Location: [facility] CATH LAB;  Service: Cardiovascular;;  . REPLACEMENT TOTAL KNEE Left 2003  . TOTAL HIP ARTHROPLASTY  09/08/2007   left, Dr. Wynelle Link  . TOTAL HIP ARTHROPLASTY Right 2003  . TRANSTHORACIC ECHOCARDIOGRAM  08/2012   Normal LV size & function.  EF 55-60%, no regional WMA, Gr 1 DD, mild MR, mild-mod TR; aortic sclerosis without stenosis     Social History:  Ambulatory  independently occasional cane    reports that she quit smoking about 34 years ago. Her smoking use included Cigarettes. She quit after 30.00 years of use. She has never used smokeless tobacco. She reports that she drinks alcohol. She reports that she does not use drugs.  Allergies:   Allergies  Allergen Reactions  . Atorvastatin Shortness Of  Breath  . Brilinta [Ticagrelor] Shortness Of Breath    BLEEDING  . Clopidogrel Bisulfate Shortness Of Breath    BRUISING  . Codeine Nausea And Vomiting  . Sulfonamide Derivatives Other (See Comments)    Intestinal upset       Family History:   Family History  Problem Relation Age of Onset  . Pancreatic cancer Father   . Prostate cancer Father   . Skin cancer Father   . Heart disease Mother   . Hypertension Mother   . Pancreatic cancer Brother   . Heart disease Sister   . Colon cancer Brother     ? Colostomy  . Prostate cancer Brother   . Colon cancer Sister   . Melanoma Brother     Medications: Prior to Admission medications   Medication Sig Start Date End Date Taking? Authorizing Provider  albuterol (PROVENTIL HFA;VENTOLIN HFA) 108 (90 BASE) MCG/ACT inhaler Inhale 2 puffs into the lungs every 6 (six) hours as needed for wheezing.    Historical Provider, MD  aspirin EC 81 MG tablet Take 81 mg by mouth daily.    Historical Provider, MD  Biotin 5000 MCG CAPS Take 5,000 mcg by mouth daily.    Historical Provider, MD  calcium citrate-vitamin D (CITRACAL+D) 315-200 MG-UNIT per tablet Take 0.5 tablets by mouth daily.     Historical Provider, MD  CALCIUM/MAGNESIUM/ZINC FORMULA PO Take 1 tablet by mouth daily.    Historical Provider, MD  cetirizine (ZYRTEC) 10 MG tablet Take 5 mg by mouth 2 (two) times daily.    Historical Provider, MD  lovastatin (MEVACOR) 20 MG tablet Take 1 tablet by mouth daily. 08/23/14   Historical Provider, MD  metoprolol (LOPRESSOR) 50 MG tablet Take 25 mg by mouth 2 (two) times daily.     Historical Provider, MD  polyethylene glycol powder (MIRALAX) powder 1 tablespoon or capful in 6 oz water as needed 07/18/15   Gatha Mayer, MD  traMADol (ULTRAM) 50 MG tablet Take 50 mg by mouth every 6 (six) hours as needed for moderate pain.  07/01/15   Historical Provider, MD  triamterene-hydrochlorothiazide (DYAZIDE) 37.5-25 MG per capsule Take 1 capsule by mouth  every morning.    Historical Provider, MD  TURMERIC PO Take 1 tablet by mouth daily.    Historical Provider, MD  Wheat Dextrin (BENEFIBER) POWD 2 tablespoons daily 07/18/15   Gatha Mayer, MD    Physical Exam: Patient Vitals for the past 24 hrs:  BP Temp Temp src Pulse Resp SpO2  09/15/16 2045 (!) 137/57 - - [name] Kitchen)  101 - 100 %  09/15/16 2015 136/67 - - 96 - 97 %  09/15/16 2000 129/65 - - 89 - 96 %  09/15/16 1945 (!) 125/57 - - 89 - 96 %  09/15/16 1930 (!) 135/59 - - 90 - 98 %  09/15/16 1900 136/75 - - 96 - 98 %  09/15/16 1845 (!) 134/55 - - - - -  09/15/16 1830 (!) 160/67 - - 94 18 96 %  09/15/16 1733 (!) 154/76 98.2 F (36.8 C) Oral (!) 109 17 98 %    1. General:  in No Acute distress 2. Psychological: Alert and   Oriented 3. Head/ENT:    Dry Mucous Membranes                          Head Non traumatic, neck supple                           Poor Dentition 4. SKIN:   decreased Skin turgor,  Skin clean Dry and intact no rash 5. Heart: Regular rate and rhythm no  Murmur, Rub or gallop 6. Lungs:   no wheezes or crackles   7. Abdomen: Soft, non-tender, Non distended 8. Lower extremities: no clubbing, cyanosis, or edema 9. Neurologically Grossly intact, moving all 4 extremities equally  10. MSK: Normal range of motion   body mass index is unknown because there is no height or weight on file.  Labs on Admission:   Labs on Admission: I have personally reviewed following labs and imaging studies  CBC:  Recent Labs Lab 09/15/16 1727  WBC 20.2*  HGB 12.9  HCT 38.6  MCV 91.5  PLT 568   Basic Metabolic Panel:  Recent Labs Lab 09/15/16 1727  NA 135  K 3.8  CL 100*  CO2 22  GLUCOSE 256*  BUN 23*  CREATININE 1.13*  CALCIUM 9.9   GFR: CrCl cannot be calculated (Unknown ideal weight.). Liver Function Tests: No results for input(s): AST, ALT, ALKPHOS, BILITOT, PROT, ALBUMIN in the last 168 hours. No results for input(s): LIPASE, AMYLASE in the last 168 hours. No  results for input(s): AMMONIA in the last 168 hours. Coagulation Profile: No results for input(s): INR, PROTIME in the last 168 hours. Cardiac Enzymes: No results for input(s): CKTOTAL, CKMB, CKMBINDEX, TROPONINI in the last 168 hours. BNP (last 3 results) No results for input(s): PROBNP in the last 8760 hours. HbA1C: No results for input(s): HGBA1C in the last 72 hours. CBG: No results for input(s): GLUCAP in the last 168 hours. Lipid Profile: No results for input(s): CHOL, HDL, LDLCALC, TRIG, CHOLHDL, LDLDIRECT in the last 72 hours. Thyroid Function Tests: No results for input(s): TSH, T4TOTAL, FREET4, T3FREE, THYROIDAB in the last 72 hours. Anemia Panel: No results for input(s): VITAMINB12, FOLATE, FERRITIN, TIBC, IRON, RETICCTPCT in the last 72 hours. Urine analysis:    Component Value Date/Time   COLORURINE YELLOW 10/22/2012 1726   APPEARANCEUR CLOUDY (A) 10/22/2012 1726   LABSPEC 1.020 10/22/2012 1726   PHURINE 7.0 10/22/2012 1726   GLUCOSEU NEGATIVE 10/22/2012 1726   HGBUR NEGATIVE 10/22/2012 1726   BILIRUBINUR NEGATIVE 10/22/2012 1726   KETONESUR NEGATIVE 10/22/2012 1726   PROTEINUR NEGATIVE 10/22/2012 1726   UROBILINOGEN 0.2 10/22/2012 1726   NITRITE NEGATIVE 10/22/2012 1726   LEUKOCYTESUR LARGE (A) 10/22/2012 1726   Sepsis Labs: @LABRCNTIP (procalcitonin:4,lacticidven:4) )No results found for this or any previous visit (from the past 240 hour(s)).  UA   ordered  Lab Results  Component Value Date   HGBA1C 6.5 (H) 07/14/2012    CrCl cannot be calculated (Unknown ideal weight.).  BNP (last 3 results) No results for input(s): PROBNP in the last 8760 hours.   ECG REPORT  Independently reviewed Rate: 110  Rhythm: Sinus tachycardia ST&T Change: No acute ischemic changes   QTC 427  There were no vitals filed for this visit.   Cultures:    Component Value Date/Time   SDES URINE, CLEAN CATCH 10/22/2012 1726   SPECREQUEST NONE 10/22/2012 1726   CULT NO  GROWTH 10/22/2012 1726   REPTSTATUS 10/23/2012 FINAL 10/22/2012 1726     Radiological Exams on Admission: Dg Chest 2 View  Result Date: 09/15/2016 CLINICAL DATA:  Shortness of breath and chest pain. EXAM: CHEST  2 VIEW COMPARISON:  CT of the chest 09/27/2015 FINDINGS: Cardiomediastinal silhouette is normal. Mediastinal contours appear intact. Tortuosity and calcific atherosclerotic disease of the aorta present. There is no evidence of focal airspace consolidation, pleural effusion or pneumothorax. Osseous structures are without acute abnormality. Bilateral severe arthropathy of the glenohumeral joints. Soft tissues are grossly normal. IMPRESSION: No active cardiopulmonary disease. Electronically Signed   By: Fidela Salisbury M.D.   On: 09/15/2016 18:26   Ct Chest Wo Contrast  Result Date: 09/15/2016 CLINICAL DATA:  83 year old female with history of pain in the upper chest, neck and shoulders since lunch time today. Shortness of breath. EXAM: CT CHEST WITHOUT CONTRAST TECHNIQUE: Multidetector CT imaging of the chest was performed following the standard protocol without IV contrast. COMPARISON:  Chest CT 09/27/2015. FINDINGS: Cardiovascular: Heart size is normal. There is no significant pericardial fluid, thickening or pericardial calcification. There is aortic atherosclerosis, as well as atherosclerosis of the great vessels of the mediastinum and the coronary arteries, including calcified atherosclerotic plaque in the left main, left anterior descending, left circumflex and right coronary arteries. Mediastinum/Nodes: No pathologically enlarged mediastinal or hilar lymph nodes. Please note that accurate exclusion of hilar adenopathy is limited on noncontrast CT scans. Esophagus is unremarkable in appearance. No axillary lymphadenopathy. Lungs/Pleura: No acute consolidative airspace disease. No pleural effusions. A few scattered tiny pulmonary nodules are noted in the lungs bilaterally, the largest of  which measures only 4 mm in the lateral segment of the right middle lobe (image 83 of series 8), all of which are unchanged and considered benign. In addition, there are 2 small finger-like densities in the lungs, in the left upper lobe on image 42 of series 8, and in the posterior aspect of the right lower lobe on image 84 of series 8, both of which are unchanged in retrospect compared to the prior examination, favored to reflect areas of mucoid impaction within distal bronchi and bronchioles. No other larger more suspicious appearing pulmonary nodules or masses are noted. No acute consolidative airspace disease. No pleural effusions. Mild scarring in the medial aspect of the right lower lobe. Upper Abdomen: Aortic atherosclerosis. Musculoskeletal: There are no aggressive appearing lytic or blastic lesions noted in the visualized portions of the skeleton. IMPRESSION: 1. No acute findings in the thorax to account for the patient's symptoms. 2. Aortic atherosclerosis, in addition to left main and 3 vessel coronary artery disease. 3. Multiple small pulmonary nodules measuring 4 mm or less in size, stable compared to prior examinations, considered benign. 4. Finger-like densities in the left upper lobe and medial right lower lobe, similar to prior examination 09/27/2015, favored to reflect areas of chronic mucoid impaction within distal  bronchi and terminal bronchioles. Electronically Signed   By: Vinnie Langton M.D.   On: 09/15/2016 19:50    Chart has been reviewed    Assessment/Plan  83 y.o. female with medical history significant of CAD  HLD, HTN, right-sided colon cancer, asthma, iron deficiency anemia minutes for atypical chest pain found to have leukocytosis likely due steroids    Present on Admission:    . Chest pain - seems atypical but given associated shortness of breath will admit and CYCLE CE, serial ECG, has known hx of CAD CAD - continue home medications, aspirin resume statins after  discharge, home med not in formulary . Essential hypertension continue Home medications . Leukocytosis next secondary to recent steroid injection  No evidence of infection at this point . Asthma chronic stable continue home medications  Other plan as per orders.  DVT prophylaxis:    Lovenox     Code Status:  FULL CODE  as per patient    Family Communication:   Family not  at  Bedside    Disposition Plan:     To home once workup is complete and patient is stable                              Consults called: email cardiology  May need follow up as an outpatient  Admission status:   obs   Level of care      Tele      I have spent a total of 57 min on this admission    Demitrus Francisco 09/15/2016, 9:57 PM    [facility]  Pager [phone]   after 2 AM please page floor coverage PA If 7AM-7PM, please contact the day team taking care of the patient  [email or web address]  Password [email or web address]

## 2016-09-15 NOTE — ED Triage Notes (Signed)
Pt presents with c/o CP. She describes the pain as an aching in her upper chest/neck, shoulders. The pain began today around lunchtime today while she was walking around the house doing normal household activities. She reports SOB. She denies fevers, weakness, dizziness, cough. She took 324 mg asa and applied salonpas packs to her shoulders with some imiprovement

## 2016-09-15 NOTE — ED Notes (Signed)
Pt ambulated to bathroom 

## 2016-09-15 NOTE — ED Notes (Signed)
Patient transported to CT 

## 2016-09-15 NOTE — ED Notes (Signed)
Pt having aching muscular upper shoulder pain bilateral

## 2016-09-16 DIAGNOSIS — M25511 Pain in right shoulder: Secondary | ICD-10-CM | POA: Diagnosis not present

## 2016-09-16 DIAGNOSIS — I2510 Atherosclerotic heart disease of native coronary artery without angina pectoris: Secondary | ICD-10-CM | POA: Diagnosis not present

## 2016-09-16 DIAGNOSIS — J45909 Unspecified asthma, uncomplicated: Secondary | ICD-10-CM | POA: Diagnosis not present

## 2016-09-16 DIAGNOSIS — D72829 Elevated white blood cell count, unspecified: Secondary | ICD-10-CM | POA: Diagnosis not present

## 2016-09-16 DIAGNOSIS — I10 Essential (primary) hypertension: Secondary | ICD-10-CM | POA: Diagnosis not present

## 2016-09-16 DIAGNOSIS — I252 Old myocardial infarction: Secondary | ICD-10-CM | POA: Diagnosis not present

## 2016-09-16 DIAGNOSIS — R079 Chest pain, unspecified: Secondary | ICD-10-CM | POA: Diagnosis not present

## 2016-09-16 DIAGNOSIS — R0789 Other chest pain: Secondary | ICD-10-CM | POA: Diagnosis not present

## 2016-09-16 DIAGNOSIS — R918 Other nonspecific abnormal finding of lung field: Secondary | ICD-10-CM | POA: Diagnosis not present

## 2016-09-16 DIAGNOSIS — M25512 Pain in left shoulder: Secondary | ICD-10-CM | POA: Diagnosis not present

## 2016-09-16 DIAGNOSIS — Z955 Presence of coronary angioplasty implant and graft: Secondary | ICD-10-CM | POA: Diagnosis not present

## 2016-09-16 DIAGNOSIS — D509 Iron deficiency anemia, unspecified: Secondary | ICD-10-CM | POA: Diagnosis not present

## 2016-09-16 DIAGNOSIS — E785 Hyperlipidemia, unspecified: Secondary | ICD-10-CM | POA: Diagnosis not present

## 2016-09-16 LAB — TROPONIN I: Troponin I: <0.03 ng/mL (ref <0.03)

## 2016-09-16 NOTE — Care Management Note (Signed)
Case Management Note  Patient Details  Name: [name] MRN: [id number] Date of Birth: Jan 08, 1928  Subjective/Objective:     Pt admitted with Chest pressure               Action/Plan:   PTA independent from home with husband.  Pt has PCP and denied barriers to obtaining medications.  CM will continue to follow for discharge needs   Expected Discharge Date:                  Expected Discharge Plan:  Home/Self Care  In-House Referral:     Discharge planning Services  CM Consult  Post Acute Care Choice:    Choice offered to:     DME Arranged:    DME Agency:     HH Arranged:    HH Agency:     Status of Service:  Completed, signed off  If discussed at [employer] of Stay Meetings, dates discussed:    Additional Comments:  [name], RN 09/16/2016, 11:39 AM

## 2016-09-16 NOTE — Discharge Summary (Addendum)
Physician Discharge Summary  [name] NWG:[id number] DOB: May 04, 1928 DOA: 09/15/2016  PCP: Mathews Argyle, MD  Admit date: 09/15/2016 Discharge date: 09/16/2016  Admitted From: Home Disposition:  Home  Recommendations for Outpatient Follow-up:  1. Follow up with PCP in 1 week 2. Follow up with Cardiology Dr. Ellyn Hack in 1 week  3. Please obtain BMP/CBC in 1 week   Home Health: No  Equipment/Devices: None   Discharge Condition: Stable CODE STATUS: Full  Diet recommendation: Heart healthy   Brief/Interim Summary: From H&P: [name] is a 83 y.o. female with medical history significant of CAD, HLD, HTN, right-sided colon cancer, asthma, iron deficiency anemia. She presented with upper chest pain across her chest associated with bilateral shoulder pain and shortness of breath. It started during her baseline activities without severe exertion and improved with tylenol and rest. She recently received cortisone shot in right knee for chronic arthritis. Case discussed with cardiology and reviewed by them. Troponin was negative, EKG unremarkable, and patient's chest pain improved. She is discharged home and instructed to follow up with cardiology as well as her PCP. She is very anxious to go home.   Discharge Diagnoses:  Active Problems:   Carcinoma of ascending colon ([place])   Asthma   CAD S/P PCI: OM2 PTCA 7/02, OM2 Stent 9/02, ISR 11/03 - PTCA; RCA DES 1 /14/14   Essential hypertension   Chest pain   Leukocytosis  Atypical chest pain -Pain of upper chest associated with bilateral shoulder pains. States that she is due for cortisone shots in her shoulders now. Pain has decreased with tylenol.  -Troponin negative x 4  -CTA chest without PE  -EKG Normal sinus without ST changes  -Last stress test in 08/2014 normal. Could repeat echo as outpatient.  -Need to follow up with cardiology -Continue aspirin, mevacor   Essential HTN -Stable -Continue home meds, lopressor    Leukocytosis -Without obvious infectious etiology, no fevers, UA unremarkable  -Likely secondary to recent cortisone injection -Repeat CBC as outpatient  -Finger-like densities in the left upper lobe and medial right lower lobe, similar to prior examination 09/27/2015, favored to reflect areas of chronic mucoid impaction within distal bronchi and terminal bronchioles.  Multiple small pulmonary nodules -Measuring 4 mm or less in size, stable compared to prior examinations, considered benign.   Discharge Instructions  Discharge Instructions    Call MD for:    Complete by:  As directed    Recurrent chest pain   Call MD for:  difficulty breathing, headache or visual disturbances    Complete by:  As directed    Call MD for:  extreme fatigue    Complete by:  As directed    Call MD for:  persistant dizziness or light-headedness    Complete by:  As directed    Diet - low sodium heart healthy    Complete by:  As directed    Increase activity slowly    Complete by:  As directed      Allergies as of 09/16/2016      Reactions   Atorvastatin Shortness Of Breath   Brilinta [ticagrelor] Shortness Of Breath   BLEEDING   Clopidogrel Bisulfate Shortness Of Breath   BRUISING   Codeine Nausea And Vomiting   Sulfonamide Derivatives Other (See Comments)   Intestinal upset      Medication List    TAKE these medications   acetaminophen 650 MG CR tablet Commonly known as:  TYLENOL Take 650 mg by mouth every  8 (eight) hours as needed for pain.   albuterol 108 (90 Base) MCG/ACT inhaler Commonly known as:  PROVENTIL HFA;VENTOLIN HFA Inhale 2 puffs into the lungs every 6 (six) hours as needed for wheezing.   aspirin EC 81 MG tablet Take 81 mg by mouth daily.   BENEFIBER Powd 2 tablespoons daily   lovastatin 20 MG tablet Commonly known as:  MEVACOR Take 10 tablets by mouth daily.   metoprolol 50 MG tablet Commonly known as:  LOPRESSOR Take 25 mg by mouth 2 (two) times daily.    polyethylene glycol powder powder Commonly known as:  MIRALAX 1 tablespoon or capful in 6 oz water as needed   traMADol 50 MG tablet Commonly known as:  ULTRAM Take 50 mg by mouth every 6 (six) hours as needed for moderate pain.      Follow-up [place], MD. Call in 2 day(s).   Specialty:  Internal Medicine Contact information: [address] [place] [address] [phone]        Glenetta Hew, MD. Schedule an appointment as soon as possible for a visit in 1 week(s).   Specialty:  Cardiology Contact information: [address] [place] [address] [place] [address] [phone]          Allergies  Allergen Reactions  . Atorvastatin Shortness Of Breath  . Brilinta [Ticagrelor] Shortness Of Breath    BLEEDING  . Clopidogrel Bisulfate Shortness Of Breath    BRUISING  . Codeine Nausea And Vomiting  . Sulfonamide Derivatives Other (See Comments)    Intestinal upset    Consultations:  None   Procedures/Studies: Dg Chest 2 View  Result Date: 09/15/2016 CLINICAL DATA:  Shortness of breath and chest pain. EXAM: CHEST  2 VIEW COMPARISON:  CT of the chest 09/27/2015 FINDINGS: Cardiomediastinal silhouette is normal. Mediastinal contours appear intact. Tortuosity and calcific atherosclerotic disease of the aorta present. There is no evidence of focal airspace consolidation, pleural effusion or pneumothorax. Osseous structures are without acute abnormality. Bilateral severe arthropathy of the glenohumeral joints. Soft tissues are grossly normal. IMPRESSION: No active cardiopulmonary disease. Electronically Signed   By: Fidela Salisbury M.D.   On: 09/15/2016 18:26   Ct Chest Wo Contrast  Result Date: 09/15/2016 CLINICAL DATA:  83 year old female with history of pain in the upper chest, neck and shoulders since lunch time today. Shortness of breath. EXAM: CT CHEST WITHOUT CONTRAST TECHNIQUE: Multidetector CT imaging of the chest  was performed following the standard protocol without IV contrast. COMPARISON:  Chest CT 09/27/2015. FINDINGS: Cardiovascular: Heart size is normal. There is no significant pericardial fluid, thickening or pericardial calcification. There is aortic atherosclerosis, as well as atherosclerosis of the great vessels of the mediastinum and the coronary arteries, including calcified atherosclerotic plaque in the left main, left anterior descending, left circumflex and right coronary arteries. Mediastinum/Nodes: No pathologically enlarged mediastinal or hilar lymph nodes. Please note that accurate exclusion of hilar adenopathy is limited on noncontrast CT scans. Esophagus is unremarkable in appearance. No axillary lymphadenopathy. Lungs/Pleura: No acute consolidative airspace disease. No pleural effusions. A few scattered tiny pulmonary nodules are noted in the lungs bilaterally, the largest of which measures only 4 mm in the lateral segment of the right middle lobe (image 83 of series 8), all of which are unchanged and considered benign. In addition, there are 2 small finger-like densities in the lungs, in the left upper lobe on image 42 of series 8, and in the posterior aspect of  the right lower lobe on image 84 of series 8, both of which are unchanged in retrospect compared to the prior examination, favored to reflect areas of mucoid impaction within distal bronchi and bronchioles. No other larger more suspicious appearing pulmonary nodules or masses are noted. No acute consolidative airspace disease. No pleural effusions. Mild scarring in the medial aspect of the right lower lobe. Upper Abdomen: Aortic atherosclerosis. Musculoskeletal: There are no aggressive appearing lytic or blastic lesions noted in the visualized portions of the skeleton. IMPRESSION: 1. No acute findings in the thorax to account for the patient's symptoms. 2. Aortic atherosclerosis, in addition to left main and 3 vessel coronary artery disease. 3.  Multiple small pulmonary nodules measuring 4 mm or less in size, stable compared to prior examinations, considered benign. 4. Finger-like densities in the left upper lobe and medial right lower lobe, similar to prior examination 09/27/2015, favored to reflect areas of chronic mucoid impaction within distal bronchi and terminal bronchioles. Electronically Signed   By: Vinnie Langton M.D.   On: 09/15/2016 19:50      Discharge Exam: Vitals:   09/16/16 0604 09/16/16 0931  BP: (!) 129/50 105/79  Pulse: 76 83  Resp: 18   Temp: 98.2 F (36.8 C)    Vitals:   09/15/16 2229 09/16/16 0216 09/16/16 0604 09/16/16 0931  BP: (!) 153/64 128/61 (!) 129/50 105/79  Pulse: 86 81 76 83  Resp: 19 18 18    Temp: 98.2 F (36.8 C) 98.6 F (37 C) 98.2 F (36.8 C)   TempSrc: Oral Oral Oral   SpO2: 98% 98% 97%   Weight: 71.1 kg (156 lb 11.2 oz)     Height: 5' (1.524 m)       General: Pt is alert, awake, not in acute distress Cardiovascular: RRR, S1/S2 +, no rubs, no gallops Respiratory: CTA bilaterally, no wheezing, no rhonchi Abdominal: Soft, NT, ND, bowel sounds + Extremities: no edema, no cyanosis    The results of significant diagnostics from this hospitalization (including imaging, microbiology, ancillary and laboratory) are listed below for reference.     Microbiology: No results found for this or any previous visit (from the past 240 hour(s)).   Labs: BNP (last 3 results) No results for input(s): BNP in the last 8760 hours. Basic Metabolic Panel:  Recent Labs Lab 09/15/16 1727  NA 135  K 3.8  CL 100*  CO2 22  GLUCOSE 256*  BUN 23*  CREATININE 1.13*  CALCIUM 9.9   Liver Function Tests: No results for input(s): AST, ALT, ALKPHOS, BILITOT, PROT, ALBUMIN in the last 168 hours. No results for input(s): LIPASE, AMYLASE in the last 168 hours. No results for input(s): AMMONIA in the last 168 hours. CBC:  Recent Labs Lab 09/15/16 1727 09/15/16 2105  WBC 20.2* 18.4*   NEUTROABS  --  16.2*  HGB 12.9 11.6*  HCT 38.6 34.2*  MCV 91.5 91.2  PLT 280 242   Cardiac Enzymes:  Recent Labs Lab 09/15/16 2316 09/16/16 0229 09/16/16 0555  TROPONINI <0.03 <0.03 <0.03   BNP: Invalid input(s): POCBNP CBG: No results for input(s): GLUCAP in the last 168 hours. D-Dimer No results for input(s): DDIMER in the last 72 hours. Hgb A1c No results for input(s): HGBA1C in the last 72 hours. Lipid Profile No results for input(s): CHOL, HDL, LDLCALC, TRIG, CHOLHDL, LDLDIRECT in the last 72 hours. Thyroid function studies No results for input(s): TSH, T4TOTAL, T3FREE, THYROIDAB in the last 72 hours.  Invalid input(s): FREET3 Anemia work up  No results for input(s): VITAMINB12, FOLATE, FERRITIN, TIBC, IRON, RETICCTPCT in the last 72 hours. Urinalysis    Component Value Date/Time   COLORURINE YELLOW 09/15/2016 2104   APPEARANCEUR CLEAR 09/15/2016 2104   LABSPEC 1.017 09/15/2016 2104   PHURINE 5.0 09/15/2016 2104   GLUCOSEU NEGATIVE 09/15/2016 2104   HGBUR NEGATIVE 09/15/2016 2104   BILIRUBINUR NEGATIVE 09/15/2016 2104   KETONESUR NEGATIVE 09/15/2016 2104   PROTEINUR NEGATIVE 09/15/2016 2104   UROBILINOGEN 0.2 10/22/2012 1726   NITRITE NEGATIVE 09/15/2016 2104   LEUKOCYTESUR SMALL (A) 09/15/2016 2104   Sepsis Labs Invalid input(s): PROCALCITONIN,  WBC,  LACTICIDVEN Microbiology No results found for this or any previous visit (from the past 240 hour(s)).   Time coordinating discharge: 45 minutes  SIGNED:  Dessa Phi, DO [facility] Pager [phone]  If 7PM-7AM, please contact night-coverage [email or web address] Password [facility] 09/16/2016, 12:56 PM

## 2016-09-16 NOTE — Care Management Obs Status (Signed)
[facility] NOTIFICATION   Patient Details  Name: [name] MRN: [id number] Date of Birth: 1928-06-06   Medicare Observation Status Notification Given:  Yes    [name], RN 09/16/2016, 11:40 AM

## 2016-09-17 DIAGNOSIS — M12812 Other specific arthropathies, not elsewhere classified, left shoulder: Secondary | ICD-10-CM | POA: Diagnosis not present

## 2016-09-17 DIAGNOSIS — M12811 Other specific arthropathies, not elsewhere classified, right shoulder: Secondary | ICD-10-CM | POA: Diagnosis not present

## 2016-09-18 DIAGNOSIS — R0789 Other chest pain: Secondary | ICD-10-CM | POA: Diagnosis not present

## 2016-09-18 DIAGNOSIS — J45909 Unspecified asthma, uncomplicated: Secondary | ICD-10-CM | POA: Diagnosis not present

## 2016-09-29 ENCOUNTER — Encounter: Payer: Self-pay | Admitting: Cardiology

## 2016-09-29 NOTE — Progress Notes (Signed)
PCP: Mathews Argyle, MD  Clinic Note: Chief Complaint  Patient presents with  . Coronary Artery Disease  . Hospitalization Follow-up    Overnight observation for atypical chest pain   HPI: [name] is a 83 y.o. female with a long history of CAD noted below who presents today for HOSPITAL f/u from March 17-18 2018: - Admitted with upper chest pain and bilateral shoulder pain which was of breath. It began during baseline activities without significant exertion and improved with Tylenol and rest. Troponin negative. EKG unremarkable. Discharged home with plans for cardiology follow-up. No evaluation done. She had some atypical symptoms at the time of her first event back in 2002 and 2014. What she described then as her anginal symptom was a "sizzling" feeling across her chest associated with significant shortness of breath. This was definitely made worse with exertion.  CAD/Cardiac History: Former patient of Dr. Terance Ice a) 7/'02: 1st Cutting PTCA- OM2 --> restenosed 9/'02 NSTEMI--> 3.0 mm x 12 mm BMS-OM 2;  b) 11/'03 DOE w/ + Cardiolite - Cutter PTCA for ISR -- patent in 2004 (after False + Cardiolite);  c) 07/2012 - Unstable Angina -- PCI to proximal RCA with Promus Premier DES 2.75 mm x 20 mm (3.0 mm), patent OM2 stent ~10% ISR (HARDING) - at that time was cared for by Dr. Rollene Fare d) 2D Echo 08/2012: EF 55-60%. GR 1 DD. Mild MR e) Myoview 08/2014: Normal LV size and function. Normal wall motion. LOW RISK.  Studies Reviewed: no new studies (PMH updated from list above)  [name] was last seen on March 2016 by Cecilie Kicks, NP (last seen by me in September 2015) - this was after her outpatient nuclear study reviewed above. She was doing well with no chest pain or recurrence of rapid heart rate. When I saw her in 2015, she was very forceful at the idea of not taking Plavix any further. She insisted on coming off of them in 6 months.  Plan for any potential PCI going  forward would be either BMS or new generation DES it would limit the time of dual antiplatelet therapy.  Interval History: [name] presents today with no further episodes of chest pain. She had that one episode leading her to go to the emergency room, but since then has not had any symptoms. Really this was associated with pain in both shoulders with some dyspnea. Her shoulder pain significantly improved after having shoulder injections the next day. She says she still has her same a symptom of dyspnea which is probably more related to allergies and asthma because it's not really made any worse with activity.   A year ago - had Heart racing spell - nothing came of it.   No further chest pain with rest or exertion. Stable dyspnea with no change based on exertion.  No PND, orthopnea or edema. No palpitations, lightheadedness, dizziness, weakness or syncope/near syncope. No TIA/amaurosis fugax symptoms.  No claudication.  ROS: A comprehensive was performed. Review of Systems  Constitutional: Negative for malaise/fatigue.  HENT: Positive for congestion. Negative for nosebleeds.   Respiratory: Positive for shortness of breath. Negative for cough and wheezing.        2-3 nights of dry cough 1 week before hospital  Gastrointestinal: Negative for blood in stool and melena.  Genitourinary: Negative for hematuria.  Musculoskeletal: Positive for joint pain (shoulder pain -better b/c had shots after hospital stay (usually has q 6-9 month shots) ).  Neurological: Negative for loss  of consciousness.  Endo/Heme/Allergies: Positive for environmental allergies.  Psychiatric/Behavioral: Negative for depression and memory loss. The patient is not nervous/anxious.   All other systems reviewed and are negative.   Past Medical History:  Diagnosis Date  . Adenomatous colon polyp MAY 2013   HOSPITALIZED AT [facility] WITH HGB 7 -TRANSFUSED-MASS FOUND IN ASCENDING COLON  . Aortic sclerosis    With murmur  . Asthma     . CAD S/P percutaneous coronary angioplasty 2002, 2003,2014   a) Unstable Angina 7/'02: 1st Cutting PTCA- OM2 --> restenosed 9/'02 NSTEMI--> 3.0 mm x 12 mm BMS-OM 2; b) 11/'03 DOE w/ + Cardiolite - Cutter PTCA for ISR -- patent in 2004 (after False + Cardiolite); c) 07/2012 - Unstable Angina -- PCI to proximal RCA with Promus Premier DES 2.75 mm x 20 mm (3.0 mm), patent OM2 stent ~10% ISR (HARDING)  . Complication of anesthesia 2009   HIP REPLACEMENT-PT HAD HARD TIME WAKING UP--FELT [place]  . Coronary stent restenosis due to scar tissue 05/2002   Cutting Balloon PTCA to OM2 BMS  . Diverticulosis 10/2012   Seen on Colonoscopy  . DJD (degenerative joint disease), thoracolumbar    Back, Hips & Knees  . Essential hypertension   . H/O Colon cancer 10/2011   GI - Drs. Carlean Purl - colonoscopy 10/2012: diverticulosis, stable ileocolic anastomosis of R colon  . Hyperlipidemia with target LDL less than 70   . Internal hemorrhoids   . Iron deficiency anemia due to chronic blood loss 10/2011   Blood Transfusion (EGD 09/2012 - unremarkable)  . Medication intolerance - Plavix, Effient, Brilinta    Easy bruising, GI bleeds (led to Dx of Colon CA), Brilinta - dyspnea  . Multiple lung nodules on CT 08/2012   scattered, bilateral but right>left.   . Non-Q wave ST elevation myocardial infarction (STEMI) involving left circumflex coronary artery 03/2001   a) Severe thrombic ISR of prior PTCA site in OM2 --> BMS PCI; b) Echo 08/2012: Normal LV size & function.  EF 55-60%, no regional WMA, Gr 1 DD, mild MR, mild-mod TR - NO Pulmonary HTN  . Osteoarthritis (arthritis due to wear and tear of joints)    Bilateral Hip Arthroplasty, L Knee TKA  . Osteoporosis     Past Surgical History:  Procedure Laterality Date  . APPENDECTOMY    . CHOLECYSTECTOMY    . COLONOSCOPY  11/16/2011   Procedure: COLONOSCOPY;  Surgeon: Gatha Mayer, MD;  Location: [place];  Service: Endoscopy;  Laterality: N/A;  .  COLONOSCOPY  10/2012   Gessner: diverticulosis, stable R colon ileocolic anastomosis  . COLONOSCOPY W/ BIOPSIES AND POLYPECTOMY  06/15/2008   adenomatous polyps, diverticulosis, internal hemorrhoids  . COLONOSCOPY WITH PROPOFOL N/A 07/18/2015   Procedure: COLONOSCOPY WITH PROPOFOL;  Surgeon: Gatha Mayer, MD;  Location: [facility];  Service: Endoscopy;  Laterality: N/A;  . CORONARY ANGIOPLASTY WITH STENT PLACEMENT  2002; 2003; 07/2012   a)  7/'02: 1st Cutting PTCA- OM2 --> restenosed 9/'02 --> 3.0 mm x 12 mm BMS-OM 2; b) 11/'03 - Cutter PTCA for ISR -- patent in 2004;   . ESOPHAGOGASTRODUODENOSCOPY N/A 10/23/2012   Procedure: ESOPHAGOGASTRODUODENOSCOPY (EGD);  Surgeon: Inda Castle, MD;  Location: [place];  Service: Endoscopy;  Laterality: N/A;  . INCISIONAL HERNIA REPAIR N/A 01/29/2013   Procedure: LAPAROSCOPIC INCISIONAL HERNIA;  Surgeon: Harl Bowie, MD;  Location: [facility];  Service: General;  Laterality: N/A;  . INSERTION OF MESH N/A 01/29/2013  Procedure: INSERTION OF MESH;  Surgeon: Harl Bowie, MD;  Location: [facility];  Service: General;  Laterality: N/A;  . LEFT HEART CATHETERIZATION WITH CORONARY ANGIOGRAM N/A 07/15/2012   Procedure: LEFT HEART CATHETERIZATION WITH CORONARY ANGIOGRAM;  Surgeon: Leonie Man, MD;  Location[facility] CATH LAB: pRCA 80-90%, OM2 BMS ~10% ISR  . NM MYOVIEW LTD  08/2014    Normal LV size and function. Normal wall motion. LOW RISK.  [name] Kitchen PERCUTANEOUS CORONARY STENT INTERVENTION (PCI-S)  07/15/2012   Procedure: PERCUTANEOUS CORONARY STENT INTERVENTION (PCI-S);  Surgeon: Leonie Man, MD;  Location: [facility] CATH LAB: c) PCI to proximal RCA with Promus Premier DES 2.75 mm x 20 mm (3.0 mm)  . REPLACEMENT TOTAL KNEE Left 2003  . TOTAL HIP ARTHROPLASTY  09/08/2007   left, Dr. Wynelle Link  . TOTAL HIP ARTHROPLASTY Right 2003  . TRANSTHORACIC ECHOCARDIOGRAM  08/2012   Normal LV size & function.  EF 55-60%, no regional WMA, Gr 1 DD, mild MR, mild-mod TR; aortic  sclerosis without stenosis    Current Meds  Medication Sig  . acetaminophen (TYLENOL) 650 MG CR tablet Take 650 mg by mouth every 8 (eight) hours as needed for pain.  [name] Kitchen albuterol (PROVENTIL HFA;VENTOLIN HFA) 108 (90 BASE) MCG/ACT inhaler Inhale 2 puffs into the lungs every 6 (six) hours as needed for wheezing.  [name] Kitchen aspirin EC 81 MG tablet Take 81 mg by mouth daily.  [name] Kitchen lovastatin (MEVACOR) 20 MG tablet Take 10 mg by mouth daily.  . metoprolol (LOPRESSOR) 50 MG tablet Take 25 mg by mouth 2 (two) times daily.   . traMADol (ULTRAM) 50 MG tablet Take 50 mg by mouth every 6 (six) hours as needed for moderate pain.     Allergies  Allergen Reactions  . Atorvastatin Shortness Of Breath  . Brilinta [Ticagrelor] Shortness Of Breath    BLEEDING  . Clopidogrel Bisulfate Shortness Of Breath    BRUISING  . Codeine Nausea And Vomiting  . Sulfonamide Derivatives Other (See Comments)    Intestinal upset    Social History   Social History  . Marital status: Married    Spouse name: N/A  . Number of children: 2  . Years of education: N/A   Occupational History  . retired    Social History Main Topics  . Smoking status: Former Smoker    Years: 30.00    Types: Cigarettes    Quit date: 07/02/1982  . Smokeless tobacco: Never Used  . Alcohol use 0.0 oz/week     Comment: occ wine  . Drug use: No  . Sexual activity: Not Asked   Other Topics Concern  . None   Social History Narrative   Married, Mother of 2 adopted children. She has 3 grandchildren.   She worked as a Psychologist, occupational at [employer].   She quit smoking in 1984, and does not drink alcohol.    family history includes Colon cancer in her brother and sister; Heart disease in her mother and sister; Hypertension in her mother; Melanoma in her brother; Pancreatic cancer in her brother and father; Prostate cancer in her brother and father; Skin cancer in her father.  Wt Readings from Last 3 Encounters:  10/01/16 155 lb (70.3 kg)    09/15/16 156 lb 11.2 oz (71.1 kg)  07/18/15 161 lb (73 kg)    PHYSICAL EXAM BP 110/60 (BP Location: Right Arm)   Pulse 79   Ht 5' (1.524 m)   Wt 155 lb (70.3 kg)  BMI 30.27 kg/m  General appearance: alert, cooperative, appears stated age, no distress and mildly obese HEENT: [place]/AT, EOMI, MMM, anicteric sclera Neck: no adenopathy, no carotid bruit and no JVD Lungs: clear to auscultation bilaterally, normal percussion bilaterally and non-labored Heart: regular rate and rhythm, S1, S2 normal, no click, rub or gallop ; 2/6 SEM RUSB; unable to palpate PMI Abdomen: soft, non-tender; bowel sounds normal; no masses,  no organomegaly; truncal obesity Extremities: extremities normal, atraumatic, no cyanosis, and edema; no femoral bruit Pulses: 2+ and symmetric; Neurologic: Mental status: Alert, oriented, thought content appropriate; Cranial nerves: normal (II-XII grossly intact)   Adult ECG Report  Rate: 79 ;  Rhythm: normal sinus rhythm and Unusual P-wave axis suggests ectopic atrial rhythm versus simply low pacemaker rhythm. Otherwise no change from previous EKG. Normal axis, intervals and durations.;   Narrative Interpretation: Relatively stable EKG  Other studies Reviewed: Additional studies/ records that were reviewed today include:  Recent Labs:   Lab Results  Component Value Date   CREATININE 1.13 (H) 09/15/2016   BUN 23 (H) 09/15/2016   NA 135 09/15/2016   K 3.8 09/15/2016   CL 100 (L) 09/15/2016   CO2 22 09/15/2016   ASSESSMENT / PLAN: Problem List Items Addressed This Visit    Atypical chest pain - Primary    She had some unusual chest/shoulder pain and was evaluated overnight. Ruled out for MI. Has not had any further symptoms, and nothing exacerbated with exertion. She is very reluctant to do much in the way of any evaluation in the prefer to observe. As result, I decided that we don't need to investigate with a stress test unless symptoms recur. I will simply 5 when  necessary nitroglycerin and recheck her in 6 months.      CAD S/P PCI: OM2 PTCA 7/02, OM2 Stent 9/02, ISR 11/03 - PTCA; RCA DES 1 /14/14 (Chronic)    Just that one episode of pain but otherwise no recurrent symptoms of angina or heart failure. She has chronic dyspnea, but I don't think this is related to her CAD. Per her request. She is only taking aspirin which is reasonable unless there is any evidence of recurrence of symptoms. She is on low-dose statin and low-dose metoprolol. With normal EF. Her blood pressure does not want ACE inhibitor or ARB.      Relevant Medications   lovastatin (MEVACOR) 20 MG tablet   Other Relevant Orders   EKG 12-Lead   Essential hypertension (Chronic)    Well-controlled on current low-dose beta blocker.      Relevant Medications   lovastatin (MEVACOR) 20 MG tablet   Other Relevant Orders   EKG 12-Lead   Hyperlipidemia with target LDL less than 70 (Chronic)    She is due to follow-up with her PCP here in April at the end of the month. Hopefully they'll be checking her labs at that time. Would like to get a copy labs just to see where we stand, but she would be reluctant to take any more than she is currently taking.      Relevant Medications   lovastatin (MEVACOR) 20 MG tablet   S/P NSTEMI -- involving left circumflex coronary artery (OM2) In-stent Thrombosis (Chronic)    In the setting of in-stent thrombosis from a catheterization years before. Very unusual anginal symptoms at the time noted: "Sizzling "feeling across the chest associated with profound dyspnea. Worse with exertion. She had a stress test done that was read as low risk in 2016 (  but she did have a "false positive stress test" in 2004). As a result she is reluctant to do repeat stress test.  She had normal EF with no regional wall motion on both echo and Myoview since.      Relevant Medications   lovastatin (MEVACOR) 20 MG tablet   Other Relevant Orders   EKG 12-Lead      Current  medicines are reviewed at length with the patient today. (+/- concerns) n/a The following changes have been made: n/a  Patient Instructions  No changes with current treatment or medications   Refilled nitrogylcerin tablets    Your physician wants you to follow-up in 6 months with Dr Ellyn Hack. You will receive a reminder letter in the mail two months in advance. If you don't receive a letter, please call our office to schedule the follow-up appointment.  If you need a refill on your cardiac medications before your next appointment, please call your pharmacy.      Studies Ordered:   Orders Placed This Encounter  Procedures  . EKG 12-Lead      Glenetta Hew, M.D., M.S. Interventional Cardiologist   Pager # [phone] Phone # [phone] [address]. [place], [place] [address]

## 2016-10-01 ENCOUNTER — Ambulatory Visit (INDEPENDENT_AMBULATORY_CARE_PROVIDER_SITE_OTHER): Payer: Medicare Other | Admitting: Cardiology

## 2016-10-01 ENCOUNTER — Other Ambulatory Visit: Payer: Self-pay | Admitting: Cardiology

## 2016-10-01 ENCOUNTER — Encounter: Payer: Self-pay | Admitting: Cardiology

## 2016-10-01 VITALS — BP 110/60 | HR 79 | Ht 60.0 in | Wt 155.0 lb

## 2016-10-01 DIAGNOSIS — R0789 Other chest pain: Secondary | ICD-10-CM | POA: Diagnosis not present

## 2016-10-01 DIAGNOSIS — E785 Hyperlipidemia, unspecified: Secondary | ICD-10-CM

## 2016-10-01 DIAGNOSIS — Z9861 Coronary angioplasty status: Secondary | ICD-10-CM

## 2016-10-01 DIAGNOSIS — I214 Non-ST elevation (NSTEMI) myocardial infarction: Secondary | ICD-10-CM | POA: Diagnosis not present

## 2016-10-01 DIAGNOSIS — I10 Essential (primary) hypertension: Secondary | ICD-10-CM

## 2016-10-01 DIAGNOSIS — I2510 Atherosclerotic heart disease of native coronary artery without angina pectoris: Secondary | ICD-10-CM

## 2016-10-01 MED ORDER — NITROGLYCERIN 0.4 MG SL SUBL: 0.4000 mg | SUBLINGUAL_TABLET | SUBLINGUAL | 6 refills | Status: DC | PRN

## 2016-10-01 NOTE — Assessment & Plan Note (Signed)
Well-controlled on current low-dose beta blocker.

## 2016-10-01 NOTE — Assessment & Plan Note (Signed)
She had some unusual chest/shoulder pain and was evaluated overnight. Ruled out for MI. Has not had any further symptoms, and nothing exacerbated with exertion. She is very reluctant to do much in the way of any evaluation in the prefer to observe. As result, I decided that we don't need to investigate with a stress test unless symptoms recur. I will simply 5 when necessary nitroglycerin and recheck her in 6 months.

## 2016-10-01 NOTE — Assessment & Plan Note (Signed)
She is due to follow-up with her PCP here in April at the end of the month. Hopefully they'll be checking her labs at that time. Would like to get a copy labs just to see where we stand, but she would be reluctant to take any more than she is currently taking.

## 2016-10-01 NOTE — Assessment & Plan Note (Addendum)
In the setting of in-stent thrombosis from a catheterization years before. Very unusual anginal symptoms at the time noted: "Sizzling "feeling across the chest associated with profound dyspnea. Worse with exertion. She had a stress test done that was read as low risk in 2016 (but she did have a "false positive stress test" in 2004). As a result she is reluctant to do repeat stress test.  She had normal EF with no regional wall motion on both echo and Myoview since.

## 2016-10-01 NOTE — Patient Instructions (Signed)
No changes with current treatment or medications   Refilled nitrogylcerin tablets    Your physician wants you to follow-up in 6 months with Dr Ellyn Hack. You will receive a reminder letter in the mail two months in advance. If you don't receive a letter, please call our office to schedule the follow-up appointment.  If you need a refill on your cardiac medications before your next appointment, please call your pharmacy.

## 2016-10-01 NOTE — Assessment & Plan Note (Signed)
Just that one episode of pain but otherwise no recurrent symptoms of angina or heart failure. She has chronic dyspnea, but I don't think this is related to her CAD. Per her request. She is only taking aspirin which is reasonable unless there is any evidence of recurrence of symptoms. She is on low-dose statin and low-dose metoprolol. With normal EF. Her blood pressure does not want ACE inhibitor or ARB.

## 2016-10-22 DIAGNOSIS — M1711 Unilateral primary osteoarthritis, right knee: Secondary | ICD-10-CM | POA: Diagnosis not present

## 2016-10-24 DIAGNOSIS — H6122 Impacted cerumen, left ear: Secondary | ICD-10-CM | POA: Diagnosis not present

## 2016-10-24 DIAGNOSIS — I129 Hypertensive chronic kidney disease with stage 1 through stage 4 chronic kidney disease, or unspecified chronic kidney disease: Secondary | ICD-10-CM | POA: Diagnosis not present

## 2016-10-24 DIAGNOSIS — H900 Conductive hearing loss, bilateral: Secondary | ICD-10-CM | POA: Diagnosis not present

## 2016-10-24 DIAGNOSIS — Z79899 Other long term (current) drug therapy: Secondary | ICD-10-CM | POA: Diagnosis not present

## 2016-10-24 DIAGNOSIS — J45909 Unspecified asthma, uncomplicated: Secondary | ICD-10-CM | POA: Diagnosis not present

## 2016-10-24 DIAGNOSIS — N183 Chronic kidney disease, stage 3 (moderate): Secondary | ICD-10-CM | POA: Diagnosis not present

## 2016-10-29 DIAGNOSIS — M25561 Pain in right knee: Secondary | ICD-10-CM | POA: Diagnosis not present

## 2016-10-29 DIAGNOSIS — M1711 Unilateral primary osteoarthritis, right knee: Secondary | ICD-10-CM | POA: Diagnosis not present

## 2016-11-01 DIAGNOSIS — J45909 Unspecified asthma, uncomplicated: Secondary | ICD-10-CM | POA: Diagnosis not present

## 2016-11-05 DIAGNOSIS — M1711 Unilateral primary osteoarthritis, right knee: Secondary | ICD-10-CM | POA: Diagnosis not present

## 2016-12-20 DIAGNOSIS — M79641 Pain in right hand: Secondary | ICD-10-CM | POA: Diagnosis not present

## 2016-12-20 DIAGNOSIS — M65342 Trigger finger, left ring finger: Secondary | ICD-10-CM | POA: Diagnosis not present

## 2016-12-20 DIAGNOSIS — M79642 Pain in left hand: Secondary | ICD-10-CM | POA: Diagnosis not present

## 2016-12-20 DIAGNOSIS — M65332 Trigger finger, left middle finger: Secondary | ICD-10-CM | POA: Diagnosis not present

## 2016-12-20 DIAGNOSIS — M65321 Trigger finger, right index finger: Secondary | ICD-10-CM | POA: Diagnosis not present

## 2017-01-14 DIAGNOSIS — H25099 Other age-related incipient cataract, unspecified eye: Secondary | ICD-10-CM | POA: Diagnosis not present

## 2017-01-14 DIAGNOSIS — H1045 Other chronic allergic conjunctivitis: Secondary | ICD-10-CM | POA: Diagnosis not present

## 2017-02-27 DIAGNOSIS — M12811 Other specific arthropathies, not elsewhere classified, right shoulder: Secondary | ICD-10-CM | POA: Diagnosis not present

## 2017-02-27 DIAGNOSIS — M75101 Unspecified rotator cuff tear or rupture of right shoulder, not specified as traumatic: Secondary | ICD-10-CM | POA: Diagnosis not present

## 2017-03-01 DIAGNOSIS — M7061 Trochanteric bursitis, right hip: Secondary | ICD-10-CM | POA: Diagnosis not present

## 2017-04-05 DIAGNOSIS — Z23 Encounter for immunization: Secondary | ICD-10-CM | POA: Diagnosis not present

## 2017-04-29 DIAGNOSIS — Z1389 Encounter for screening for other disorder: Secondary | ICD-10-CM | POA: Diagnosis not present

## 2017-04-29 DIAGNOSIS — R300 Dysuria: Secondary | ICD-10-CM | POA: Diagnosis not present

## 2017-04-29 DIAGNOSIS — Z0000 Encounter for general adult medical examination without abnormal findings: Secondary | ICD-10-CM | POA: Diagnosis not present

## 2017-04-29 DIAGNOSIS — Z23 Encounter for immunization: Secondary | ICD-10-CM | POA: Diagnosis not present

## 2017-04-29 DIAGNOSIS — I129 Hypertensive chronic kidney disease with stage 1 through stage 4 chronic kidney disease, or unspecified chronic kidney disease: Secondary | ICD-10-CM | POA: Diagnosis not present

## 2017-04-29 DIAGNOSIS — E7800 Pure hypercholesterolemia, unspecified: Secondary | ICD-10-CM | POA: Diagnosis not present

## 2017-04-29 DIAGNOSIS — N183 Chronic kidney disease, stage 3 (moderate): Secondary | ICD-10-CM | POA: Diagnosis not present

## 2017-04-29 DIAGNOSIS — Z79899 Other long term (current) drug therapy: Secondary | ICD-10-CM | POA: Diagnosis not present

## 2017-05-10 ENCOUNTER — Ambulatory Visit (INDEPENDENT_AMBULATORY_CARE_PROVIDER_SITE_OTHER): Payer: Medicare Other | Admitting: Cardiology

## 2017-05-10 ENCOUNTER — Encounter: Payer: Self-pay | Admitting: Cardiology

## 2017-05-10 VITALS — BP 122/62 | HR 78 | Ht 60.0 in | Wt 158.0 lb

## 2017-05-10 DIAGNOSIS — Z789 Other specified health status: Secondary | ICD-10-CM

## 2017-05-10 DIAGNOSIS — E785 Hyperlipidemia, unspecified: Secondary | ICD-10-CM

## 2017-05-10 DIAGNOSIS — Z9861 Coronary angioplasty status: Secondary | ICD-10-CM | POA: Diagnosis not present

## 2017-05-10 DIAGNOSIS — I2510 Atherosclerotic heart disease of native coronary artery without angina pectoris: Secondary | ICD-10-CM

## 2017-05-10 DIAGNOSIS — I10 Essential (primary) hypertension: Secondary | ICD-10-CM

## 2017-05-10 NOTE — Patient Instructions (Signed)
NO CHANGES WITH CURRENT MEDICATIONS   Your physician wants you to follow-up in 12 MONTHS WITH DR HARDING. You will receive a reminder letter in the mail two months in advance. If you don't receive a letter, please call our office to schedule the follow-up appointment.   If you need a refill on your cardiac medications before your next appointment, please call your pharmacy.  

## 2017-05-10 NOTE — Progress Notes (Addendum)
PCP: Lajean Manes, MD  Clinic Note: Chief Complaint  Patient presents with  . Follow-up    49-month  . Coronary Artery Disease    HPI: [name] is a 83 y.o. female with a PMH below who presents today for six-month follow-up for CAD.  I last saw her back in April as hospital follow-up March visit.  She had chest and bilateral shoulder pain.  Ruled out for MI and was discharged home.  CAD/Cardiac History: Former patient of Dr. Terance Ice  a) 7/'02: 1st Cutting PTCA- OM2 --> restenosed 9/'02 NSTEMI--> 3.0 mm x 12 mm BMS-OM 2;  b) 11/'03 DOE w/ + Cardiolite - Cutter PTCA for ISR -- patent in 2004 (after False + Cardiolite);  c) 07/2012 - Unstable Angina (dicomfort across the upper chest, feels like 'sizzlng") -- PCI to proximal RCA with Promus Premier DES 2.75 mm x 20 mm (3.0 mm), patent OM2 stent ~10% ISR (HARDING) - at that time was cared for by Dr. Rollene Fare  d) 2D Echo 08/2012: EF 55-60%. GR 1 DD. Mild MR  e) Myoview 08/2014: Normal LV size and function. Normal wall motion. LOW RISK.  CHARLIE CHAR was last seen on  October 01, 2016 - No further chest pain.  Recent Hospitalizations: None  Studies Personally Reviewed - (if available, images/films reviewed: From Epic Chart or Care Everywhere)  None  Interval History: [name] returns today for routine follow-up feeling quite well.  She notes that if it was not for her shoulder and joints hurt in her, she would be doing well.  She just is limited to what she can do because of her arthritis pains.  Right now her main concern is with her shoulder aches and pains.  She has not had any more of her anginal type chest discomfort with rest or exertion since our last visit.  She says that her PCP reduce her lovastatin dose down to 10 mg from 20mg  partly because of "well-controlled (too low)" and because of her arthralgias.  She did not notice any difference in the arthralgias with this reduction. She denies any significant exertional  dyspnea except for the fact that it hurts to do much. She denies any rapid irregular heartbeats or palpitations, no lightheadedness or dizziness unless she stands up too fast.  No syncope or near syncope, TIA or amaurosis fugax symptoms. No claudication.  ROS: A comprehensive was performed. Review of Systems  Constitutional: Negative for malaise/fatigue.  HENT: Negative for congestion and nosebleeds.   Gastrointestinal: Negative for blood in stool and melena.  Genitourinary: Negative for hematuria.  Musculoskeletal: Positive for back pain, joint pain (Hips knees and shoulders) and neck pain. Negative for myalgias.  Neurological: Negative for focal weakness, seizures and loss of consciousness.  Endo/Heme/Allergies: Negative for environmental allergies. Does not bruise/bleed easily.  Psychiatric/Behavioral: Negative for depression and memory loss. The patient does not have insomnia.   All other systems reviewed and are negative.  I have reviewed and (if needed) personally updated the patient's problem list, medications, allergies, past medical and surgical history, social and family history.   Past Medical History:  Diagnosis Date  . Adenomatous colon polyp MAY 2013   HOSPITALIZED AT [facility] WITH HGB 7 -TRANSFUSED-MASS FOUND IN ASCENDING COLON  . Aortic sclerosis    With murmur  . Asthma   . CAD S/P percutaneous coronary angioplasty 2002, 2003,2014   a) Unstable Angina 7/'02: 1st Cutting PTCA- OM2 --> restenosed 9/'02 NSTEMI--> 3.0 mm x 12 mm BMS-OM 2;  b) 11/'03 DOE w/ + Cardiolite - Cutter PTCA for ISR -- patent in 2004 (after False + Cardiolite); c) 07/2012 - Unstable Angina -- PCI to proximal RCA with Promus Premier DES 2.75 mm x 20 mm (3.0 mm), patent OM2 stent ~10% ISR (HARDING)  . Complication of anesthesia 2009   HIP REPLACEMENT-PT HAD HARD TIME WAKING UP--FELT [place]  . Coronary stent restenosis due to scar tissue 05/2002   Cutting Balloon PTCA to OM2 BMS  .  Diverticulosis 10/2012   Seen on Colonoscopy  . DJD (degenerative joint disease), thoracolumbar    Back, Hips & Knees  . Essential hypertension   . H/O Colon cancer 10/2011   GI - Drs. Carlean Purl - colonoscopy 10/2012: diverticulosis, stable ileocolic anastomosis of R colon  . Hyperlipidemia with target LDL less than 70   . Internal hemorrhoids   . Iron deficiency anemia due to chronic blood loss 10/2011   Blood Transfusion (EGD 09/2012 - unremarkable)  . Medication intolerance - Plavix, Effient, Brilinta    Easy bruising, GI bleeds (led to Dx of Colon CA), Brilinta - dyspnea  . Multiple lung nodules on CT 08/2012   scattered, bilateral but right>left.   . Non-Q wave ST elevation myocardial infarction (STEMI) involving left circumflex coronary artery 03/2001   a) Severe thrombic ISR of prior PTCA site in OM2 --> BMS PCI; b) Echo 08/2012: Normal LV size & function.  EF 55-60%, no regional WMA, Gr 1 DD, mild MR, mild-mod TR - NO Pulmonary HTN  . Osteoarthritis (arthritis due to wear and tear of joints)    Bilateral Hip Arthroplasty, L Knee TKA  . Osteoporosis     Past Surgical History:  Procedure Laterality Date  . APPENDECTOMY    . CHOLECYSTECTOMY    . COLONOSCOPY  10/2012   Gessner: diverticulosis, stable R colon ileocolic anastomosis  . COLONOSCOPY W/ BIOPSIES AND POLYPECTOMY  06/15/2008   adenomatous polyps, diverticulosis, internal hemorrhoids  . CORONARY ANGIOPLASTY WITH STENT PLACEMENT  2002; 2003; 07/2012   a)  7/'02: 1st Cutting PTCA- OM2 --> restenosed 9/'02 --> 3.0 mm x 12 mm BMS-OM 2; b) 11/'03 - Cutter PTCA for ISR -- patent in 2004;   . NM MYOVIEW LTD  08/2014    Normal LV size and function. Normal wall motion. LOW RISK.  [name] Kitchen REPLACEMENT TOTAL KNEE Left 2003  . TOTAL HIP ARTHROPLASTY  09/08/2007   left, Dr. Wynelle Link  . TOTAL HIP ARTHROPLASTY Right 2003  . TRANSTHORACIC ECHOCARDIOGRAM  08/2012   Normal LV size & function.  EF 55-60%, no regional WMA, Gr 1 DD, mild MR, mild-mod TR;  aortic sclerosis without stenosis    Current Meds  Medication Sig  . acetaminophen (TYLENOL) 650 MG CR tablet Take 650 mg by mouth every 8 (eight) hours as needed for pain.  [name] Kitchen albuterol (PROVENTIL HFA;VENTOLIN HFA) 108 (90 BASE) MCG/ACT inhaler Inhale 2 puffs into the lungs every 6 (six) hours as needed for wheezing.  [name] Kitchen aspirin EC 81 MG tablet Take 81 mg by mouth daily.  [name] Kitchen lovastatin (MEVACOR) 20 MG tablet Take 10 mg by mouth daily.  . metoprolol (LOPRESSOR) 50 MG tablet Take 25 mg by mouth 2 (two) times daily.   . nitroGLYCERIN (NITROSTAT) 0.4 MG SL tablet DISSOLVE 1 TABLET UNDER THE TONGUE EVERY 5 MINUTES AS NEEDED FOR CHEST PAIN  . traMADol (ULTRAM) 50 MG tablet Take 50 mg by mouth every 6 (six) hours as needed for moderate pain.  Allergies  Allergen Reactions  . Atorvastatin Shortness Of Breath  . Brilinta [Ticagrelor] Shortness Of Breath    BLEEDING  . Clopidogrel Bisulfate Shortness Of Breath    BRUISING  . Codeine Nausea And Vomiting  . Sulfonamide Derivatives Other (See Comments)    Intestinal upset    Social History   Socioeconomic History  . Marital status: Married    Spouse name: None  . Number of children: 2  . Years of education: None  . Highest education level: None  Social Needs  . Financial resource strain: None  . Food insecurity - worry: None  . Food insecurity - inability: None  . Transportation needs - medical: None  . Transportation needs - non-medical: None  Occupational History  . Occupation: retired  Tobacco Use  . Smoking status: Former Smoker    Years: 30.00    Types: Cigarettes    Last attempt to quit: 07/02/1982    Years since quitting: 34.8  . Smokeless tobacco: Never Used  Substance and Sexual Activity  . Alcohol use: Yes    Alcohol/week: 0.0 oz    Comment: occ wine  . Drug use: No  . Sexual activity: None  Other Topics Concern  . None  Social History Narrative   Married, Mother of 2 adopted children. She has 3 grandchildren.    She worked as a Psychologist, occupational at [employer].   She quit smoking in 1984, and does not drink alcohol.    family history includes Colon cancer in her brother and sister; Heart disease in her mother and sister; Hypertension in her mother; Melanoma in her brother; Pancreatic cancer in her brother and father; Prostate cancer in her brother and father; Skin cancer in her father.  Wt Readings from Last 3 Encounters:  05/10/17 158 lb (71.7 kg)  10/01/16 155 lb (70.3 kg)  09/15/16 156 lb 11.2 oz (71.1 kg)    PHYSICAL EXAM BP 122/62   Pulse 78   Ht 5' (1.524 m)   Wt 158 lb (71.7 kg)   SpO2 98%   BMI 30.86 kg/m  Physical Exam  Constitutional: She is oriented to person, place, and time. She appears well-developed and well-nourished. No distress.  Actually appears younger than her stated age.  Well-groomed, healthy-appearing  HENT:  Head: Normocephalic and atraumatic.  Eyes: EOM are normal.  Neck: No hepatojugular reflux and no JVD present. Carotid bruit is not present.  Cardiovascular: Normal rate, regular rhythm and normal pulses.  No extrasystoles are present. PMI is not displaced. Exam reveals no gallop, no friction rub and no midsystolic click.  Murmur heard.  Medium-pitched harsh crescendo-decrescendo early systolic murmur is present with a grade of 2/6 at the upper right sternal border radiating to the neck. Pulmonary/Chest: Effort normal and breath sounds normal. No respiratory distress. She has no wheezes.  Abdominal: Soft. Bowel sounds are normal. She exhibits no distension. There is no tenderness. There is no rebound.  Musculoskeletal: Normal range of motion. She exhibits no edema.  Neurological: She is alert and oriented to person, place, and time.  Skin: Skin is warm and dry. No rash noted. No erythema.  Psychiatric: She has a normal mood and affect. Her behavior is normal. Judgment and thought content normal.  Nursing note and vitals reviewed.   Adult ECG Report Not  checked  Other studies Reviewed: Additional studies/ records that were reviewed today include:  Recent Labs: Followed by PCP.  Labs not available.    ASSESSMENT / PLAN: Problem List  Items Addressed This Visit    CAD S/P PCI: OM2 PTCA 7/02, OM2 Stent 9/02, ISR 11/03 - PTCA; RCA DES 1 /14/14 - Primary (Chronic)    Overall doing well from her last visit.  No recurrent angina or heart failure symptoms.  Just chronic dyspnea which is persistent and stable. Remains on aspirin, low-dose statin and low-dose metoprolol.      Essential hypertension (Chronic)    Well controlled with beta-blocker.      Hyperlipidemia with target LDL less than 70 (Chronic)    Labs not available.  Monitored by PCP.  She is on low-dose lovastatin seems to be tolerating it relatively well.  Her PCP reduced the dose down to 20 mg daily.      Medication intolerance - Plavix, Effient, Brilinta (Chronic)    Since she is a long ways away from having the procedure done, I think it is reasonable to keep her off of any other antiplatelet agent besides aspirin.         Current medicines are reviewed at length with the patient today. (+/- concerns) none The following changes have been made:None  Patient Instructions  NO CHANGES WITH CURRENT MEDICATIONS   Your physician wants you to follow-up in [place].You will receive a reminder letter in the mail two months in advance. If you don't receive a letter, please call our office to schedule the follow-up appointment.  If you need a refill on your cardiac medications before your next appointment, please call your pharmacy.        Studies Ordered:   No orders of the defined types were placed in this encounter.     Glenetta Hew, M.D., M.S. Interventional Cardiologist   Pager # [phone] Phone # [phone] [address] [place],  [address]

## 2017-05-12 ENCOUNTER — Encounter: Payer: Self-pay | Admitting: Cardiology

## 2017-05-13 NOTE — Addendum Note (Signed)
Addended by: Leonie Man on: 05/13/2017 07:54 AM   Modules accepted: Level of Service

## 2017-05-13 NOTE — Assessment & Plan Note (Signed)
Well controlled with beta-blocker.

## 2017-05-13 NOTE — Assessment & Plan Note (Signed)
Since she is a long ways away from having the procedure done, I think it is reasonable to keep her off of any other antiplatelet agent besides aspirin.

## 2017-05-13 NOTE — Assessment & Plan Note (Signed)
Labs not available.  Monitored by PCP.  She is on low-dose lovastatin seems to be tolerating it relatively well.  Her PCP reduced the dose down to 20 mg daily.

## 2017-05-13 NOTE — Assessment & Plan Note (Signed)
Overall doing well from her last visit.  No recurrent angina or heart failure symptoms.  Just chronic dyspnea which is persistent and stable. Remains on aspirin, low-dose statin and low-dose metoprolol.

## 2017-05-14 DIAGNOSIS — L570 Actinic keratosis: Secondary | ICD-10-CM | POA: Diagnosis not present

## 2017-05-19 ENCOUNTER — Other Ambulatory Visit: Payer: Self-pay

## 2017-05-19 ENCOUNTER — Emergency Department (HOSPITAL_COMMUNITY)
Admission: EM | Admit: 2017-05-19 | Discharge: 2017-05-19 | Disposition: A | Discharge status: Home / Self Care | Payer: Medicare Other | Attending: Emergency Medicine | Admitting: Emergency Medicine

## 2017-05-19 ENCOUNTER — Emergency Department (HOSPITAL_COMMUNITY): Payer: Medicare Other

## 2017-05-19 ENCOUNTER — Encounter (HOSPITAL_COMMUNITY): Payer: Self-pay | Admitting: Emergency Medicine

## 2017-05-19 DIAGNOSIS — Z96652 Presence of left artificial knee joint: Secondary | ICD-10-CM | POA: Diagnosis not present

## 2017-05-19 DIAGNOSIS — Y999 Unspecified external cause status: Secondary | ICD-10-CM | POA: Diagnosis not present

## 2017-05-19 DIAGNOSIS — S0990XA Unspecified injury of head, initial encounter: Secondary | ICD-10-CM | POA: Diagnosis not present

## 2017-05-19 DIAGNOSIS — S01112A Laceration without foreign body of left eyelid and periocular area, initial encounter: Secondary | ICD-10-CM | POA: Diagnosis not present

## 2017-05-19 DIAGNOSIS — S199XXA Unspecified injury of neck, initial encounter: Secondary | ICD-10-CM | POA: Diagnosis not present

## 2017-05-19 DIAGNOSIS — Z96641 Presence of right artificial hip joint: Secondary | ICD-10-CM | POA: Insufficient documentation

## 2017-05-19 DIAGNOSIS — Z87891 Personal history of nicotine dependence: Secondary | ICD-10-CM | POA: Insufficient documentation

## 2017-05-19 DIAGNOSIS — Y92003 Bedroom of unspecified non-institutional (private) residence as the place of occurrence of the external cause: Secondary | ICD-10-CM | POA: Insufficient documentation

## 2017-05-19 DIAGNOSIS — W01190A Fall on same level from slipping, tripping and stumbling with subsequent striking against furniture, initial encounter: Secondary | ICD-10-CM | POA: Insufficient documentation

## 2017-05-19 DIAGNOSIS — W19XXXA Unspecified fall, initial encounter: Secondary | ICD-10-CM

## 2017-05-19 DIAGNOSIS — S0181XA Laceration without foreign body of other part of head, initial encounter: Secondary | ICD-10-CM | POA: Diagnosis not present

## 2017-05-19 DIAGNOSIS — Z7982 Long term (current) use of aspirin: Secondary | ICD-10-CM | POA: Insufficient documentation

## 2017-05-19 DIAGNOSIS — I2510 Atherosclerotic heart disease of native coronary artery without angina pectoris: Secondary | ICD-10-CM | POA: Diagnosis not present

## 2017-05-19 DIAGNOSIS — J45909 Unspecified asthma, uncomplicated: Secondary | ICD-10-CM | POA: Diagnosis not present

## 2017-05-19 DIAGNOSIS — S0101XA Laceration without foreign body of scalp, initial encounter: Secondary | ICD-10-CM

## 2017-05-19 DIAGNOSIS — Z79899 Other long term (current) drug therapy: Secondary | ICD-10-CM | POA: Insufficient documentation

## 2017-05-19 DIAGNOSIS — Z955 Presence of coronary angioplasty implant and graft: Secondary | ICD-10-CM | POA: Insufficient documentation

## 2017-05-19 DIAGNOSIS — Y9389 Activity, other specified: Secondary | ICD-10-CM | POA: Diagnosis not present

## 2017-05-19 DIAGNOSIS — I10 Essential (primary) hypertension: Secondary | ICD-10-CM | POA: Diagnosis not present

## 2017-05-19 MED ORDER — LIDOCAINE-EPINEPHRINE (PF) 2 %-1:200000 IJ SOLN
20.0000 mL | Freq: Once | INTRAMUSCULAR | Status: DC
  Filled 2017-05-19: qty 20

## 2017-05-19 NOTE — Discharge Instructions (Signed)
Keep area clean and dry. You can wash with soap and water Change bandage at least once daily, more if it is dirty Watch for signs of infection (redness, drainage) Have stitches removed in 5 days (there are 9 stitches in the bottom wound and 1 stitch in the top)

## 2017-05-19 NOTE — ED Triage Notes (Addendum)
Pt states she tripped this morning while making the bed and hit her head on the leg of her sewing machine.  Pt has as laceration on the left side of her forehead.  Bleeding is controlled.  Pt is currently applying an ice pack to injury.  Pt denies any other injuries or syncope.  Pt currently takes a daily aspirin.

## 2017-05-19 NOTE — ED Provider Notes (Signed)
[facility] EMERGENCY DEPARTMENT Provider Note   CSN: [id number] Arrival date & time: 05/19/17  0813     History   Chief Complaint Chief Complaint  Patient presents with  . Fall  . Laceration    HPI [name] is a 83 y.o. female who presents with a fall and facial laceration. PMH significant for CAD, asthma, hx of colon cancer, HTN. She states that she tripped over a bedspread that wa on the floor of her bedroom and hit her head on a table that holds her sewing machine. She sustained a forehead laceration and held pressure to control bleeding. She reports a soreness over the area and has left sided neck stiffness. She denies LOC, dizziness, vomiting. She denies chest pain, SOB, or any other pain on the body besides the head. She is not on anticoagulation but takes a baby ASA daily.  HPI  Past Medical History:  Diagnosis Date  . Adenomatous colon polyp MAY 2013   HOSPITALIZED AT [facility] WITH HGB 7 -TRANSFUSED-MASS FOUND IN ASCENDING COLON  . Aortic sclerosis    With murmur  . Asthma   . CAD S/P percutaneous coronary angioplasty 2002, 2003,2014   a) Unstable Angina 7/'02: 1st Cutting PTCA- OM2 --> restenosed 9/'02 NSTEMI--> 3.0 mm x 12 mm BMS-OM 2; b) 11/'03 DOE w/ + Cardiolite - Cutter PTCA for ISR -- patent in 2004 (after False + Cardiolite); c) 07/2012 - Unstable Angina -- PCI to proximal RCA with Promus Premier DES 2.75 mm x 20 mm (3.0 mm), patent OM2 stent ~10% ISR (HARDING)  . Complication of anesthesia 2009   HIP REPLACEMENT-PT HAD HARD TIME WAKING UP--FELT [place]  . Coronary stent restenosis due to scar tissue 05/2002   Cutting Balloon PTCA to OM2 BMS  . Diverticulosis 10/2012   Seen on Colonoscopy  . DJD (degenerative joint disease), thoracolumbar    Back, Hips & Knees  . Essential hypertension   . H/O Colon cancer 10/2011   GI - Drs. Carlean Purl - colonoscopy 10/2012: diverticulosis, stable ileocolic anastomosis of R colon  .  Hyperlipidemia with target LDL less than 70   . Internal hemorrhoids   . Iron deficiency anemia due to chronic blood loss 10/2011   Blood Transfusion (EGD 09/2012 - unremarkable)  . Medication intolerance - Plavix, Effient, Brilinta    Easy bruising, GI bleeds (led to Dx of Colon CA), Brilinta - dyspnea  . Multiple lung nodules on CT 08/2012   scattered, bilateral but right>left.   . Non-Q wave ST elevation myocardial infarction (STEMI) involving left circumflex coronary artery 03/2001   a) Severe thrombic ISR of prior PTCA site in OM2 --> BMS PCI; b) Echo 08/2012: Normal LV size & function.  EF 55-60%, no regional WMA, Gr 1 DD, mild MR, mild-mod TR - NO Pulmonary HTN  . Osteoarthritis (arthritis due to wear and tear of joints)    Bilateral Hip Arthroplasty, L Knee TKA  . Osteoporosis     Patient Active Problem List   Diagnosis Date Noted  . Leukocytosis 09/15/2016  . Change in bowel habits   . History of colon cancer   . Atypical chest pain 09/19/2014  . Obesity (BMI 30-39.9) 03/14/2014  . Medication intolerance - Plavix, Effient, Brilinta   . Hyperlipidemia with target LDL less than 70   . Essential hypertension   . Diverticulosis of colon without hemorrhage 03/16/2013  . Incisional hernia, without obstruction or gangrene 11/28/2012  . Asthma 10/22/2012  .  Carcinoma of ascending colon ([place]) 11/16/2011  . PERSONAL HX COLONIC POLYPS 11/29/2009  . S/P NSTEMI -- involving left circumflex coronary artery (OM2) In-stent Thrombosis 03/02/2001  . CAD S/P PCI: OM2 PTCA 7/02, OM2 Stent 9/02, ISR 11/03 - PTCA; RCA DES 1 /14/14 12/31/2000    Past Surgical History:  Procedure Laterality Date  . APPENDECTOMY    . CHOLECYSTECTOMY    . COLONOSCOPY  10/2012   [facility]: diverticulosis, stable R colon ileocolic anastomosis  . COLONOSCOPY N/A 11/16/2011   Performed by Gatha Mayer, MD at [place]  . COLONOSCOPY W/ BIOPSIES AND POLYPECTOMY  06/15/2008   adenomatous polyps, diverticulosis,  internal hemorrhoids  . COLONOSCOPY WITH PROPOFOL N/A 07/18/2015   Performed by Gatha Mayer, MD at [place]  . CORONARY ANGIOPLASTY WITH STENT PLACEMENT  2002; 2003; 07/2012   a)  7/'02: 1st Cutting PTCA- OM2 --> restenosed 9/'02 --> 3.0 mm x 12 mm BMS-OM 2; b) 11/'03 - Cutter PTCA for ISR -- patent in 2004;   . ESOPHAGOGASTRODUODENOSCOPY (EGD) N/A 10/23/2012   Performed by Inda Castle, MD at [place]  . INSERTION OF MESH N/A 01/29/2013   Performed by Harl Bowie, MD at [facility] ORS  . LAPAROSCOPIC INCISIONAL HERNIA N/A 01/29/2013   Performed by Harl Bowie, MD at [facility] ORS  . LAPAROSCOPIC RIGHT COLECTOMY Right 12/13/2011   Performed by Haywood Lasso, MD at [facility] ORS  . LEFT HEART CATHETERIZATION WITH CORONARY ANGIOGRAM N/A 07/15/2012   Performed by Leonie Man, MD at [facility] CATH LAB  . NM MYOVIEW LTD  08/2014    Normal LV size and function. Normal wall motion. LOW RISK.  [name] Kitchen PERCUTANEOUS CORONARY STENT INTERVENTION (PCI-S)  07/15/2012   Performed by Leonie Man, MD at [facility] CATH LAB  . REPLACEMENT TOTAL KNEE Left 2003  . TOTAL HIP ARTHROPLASTY  09/08/2007   left, Dr. Wynelle Link  . TOTAL HIP ARTHROPLASTY Right 2003  . TRANSTHORACIC ECHOCARDIOGRAM  08/2012   Normal LV size & function.  EF 55-60%, no regional WMA, Gr 1 DD, mild MR, mild-mod TR; aortic sclerosis without stenosis    OB History    No data available       Home Medications    Prior to Admission medications   Medication Sig Start Date End Date Taking? Authorizing Provider  acetaminophen (TYLENOL) 650 MG CR tablet Take 650 mg by mouth every 8 (eight) hours as needed for pain.   Yes [provider]  aspirin EC 81 MG tablet Take 81 mg by mouth daily.   Yes [provider]  CALCIUM-MAGNESIUM-ZINC PO Take 1 tablet 3 (three) times a week by mouth.   Yes [provider]  Loratadine-Pseudoephedrine (PX ALLERGY RELIEF D, LORATID, PO) Take 1 tablet daily by mouth.   Yes [provider]  lovastatin (MEVACOR) 20 MG tablet Take 10 mg by mouth daily. 08/23/14  Yes [provider]  Magnesium 250 MG TABS Take 250 mg 4 (four) times a week by mouth.   Yes [provider]  metoprolol (LOPRESSOR) 50 MG tablet Take 25 mg by mouth 2 (two) times daily.    Yes [provider]  traMADol (ULTRAM) 50 MG tablet Take 50 mg by mouth every 6 (six) hours as needed for moderate pain.  07/01/15  Yes [provider]  triamterene-hydrochlorothiazide (DYAZIDE) 37.5-25 MG capsule Take 1 capsule daily by mouth.   Yes [provider]  albuterol (PROVENTIL HFA;VENTOLIN HFA) 108 (90 BASE)  MCG/ACT inhaler Inhale 2 puffs into the lungs every 6 (six) hours as needed for wheezing.    [provider]  nitroGLYCERIN (NITROSTAT) 0.4 MG SL tablet DISSOLVE 1 TABLET UNDER THE TONGUE EVERY 5 MINUTES AS NEEDED FOR CHEST PAIN 10/01/16   Leonie Man, MD    Family History Family History  Problem Relation Age of Onset  . Pancreatic cancer Father   . Prostate cancer Father   . Skin cancer Father   . Heart disease Mother   . Hypertension Mother   . Pancreatic cancer Brother   . Heart disease Sister   . Colon cancer Brother        ? Colostomy  . Prostate cancer Brother   . Colon cancer Sister   . Melanoma Brother     Social History Social History   Tobacco Use  . Smoking status: Former Smoker    Years: 30.00    Types: Cigarettes    Last attempt to quit: 07/02/1982    Years since quitting: 34.9  . Smokeless tobacco: Never Used  Substance Use Topics  . Alcohol use: Yes    Alcohol/week: 0.0 oz    Comment: occ wine  . Drug use: No     Allergies   Atorvastatin; Brilinta [ticagrelor]; Clopidogrel bisulfate; Codeine; and Sulfonamide derivatives   Review of Systems Review of Systems  Respiratory: Negative for shortness of breath.   Cardiovascular: Negative for chest pain.  Musculoskeletal: Positive for neck pain and neck stiffness.  Negative for arthralgias, back pain and gait problem.  Skin: Positive for wound.  Neurological: Positive for headaches. Negative for dizziness, syncope, weakness and numbness.  All other systems reviewed and are negative.    Physical Exam Updated Vital Signs BP 140/68   Pulse 81   Temp 98.1 F (36.7 C) (Oral)   Resp 18   Ht 5' (1.524 m)   Wt 71.7 kg (158 lb)   SpO2 96%   BMI 30.86 kg/m   Physical Exam  Constitutional: She is oriented to person, place, and time. She appears well-developed and well-nourished. No distress.  HENT:  Head: Normocephalic.  4cm horizontal deep linear laceration above the eyebrow. Blood is oozing from area.  1cm jagged laceration over the left side of the forehead. Bleeding controlled.  <.5cm scratch over the left side of the nose which is not amenable to suturing   Eyes: Conjunctivae are normal. Pupils are equal, round, and reactive to light. Right eye exhibits no discharge. Left eye exhibits no discharge. No scleral icterus.  Neck: Normal range of motion.  Mild left sided paraspinal muscle tenderness  Cardiovascular: Normal rate and regular rhythm. Exam reveals no gallop and no friction rub.  No murmur heard. Pulmonary/Chest: Effort normal and breath sounds normal. No stridor. No respiratory distress. She has no wheezes. She has no rales. She exhibits no tenderness.  Abdominal: She exhibits no distension.  Neurological: She is alert and oriented to person, place, and time.  Ambulatory  Skin: Skin is warm and dry.  Psychiatric: She has a normal mood and affect. Her behavior is normal.  Nursing note and vitals reviewed.    ED Treatments / Results  Labs (all labs ordered are listed, but only abnormal results are displayed) Labs Reviewed - No data to display  EKG  EKG Interpretation None       Radiology Ct Head Wo Contrast  Result Date: 05/19/2017 CLINICAL DATA:  Fall.  Hit left side of head. EXAM: CT HEAD WITHOUT CONTRAST CT  CERVICAL  SPINE WITHOUT CONTRAST TECHNIQUE: Multidetector CT imaging of the head and cervical spine was performed following the standard protocol without intravenous contrast. Multiplanar CT image reconstructions of the cervical spine were also generated. COMPARISON:  Cervical spine x-rays dated August 17, 2014. MRI brain dated August 17, 2013. FINDINGS: CT HEAD FINDINGS Brain: No evidence of acute infarction, hemorrhage, hydrocephalus, extra-axial collection or mass lesion/mass effect. The stable moderate cerebral atrophy and chronic microvascular ischemic white matter disease. Vascular: Atherosclerotic vascular calcification of the carotid siphons. No hyperdense vessel. Skull: Normal. Negative for fracture or focal lesion. Sinuses/Orbits: No acute finding. Other: Small left frontal scalp hematoma. CT CERVICAL SPINE FINDINGS Alignment: Unchanged trace retrolisthesis at C5-C6. No traumatic malalignment. Skull base and vertebrae: No acute fracture. No primary bone lesion or focal pathologic process. Soft tissues and spinal canal: Mildly prominent prevertebral soft tissues secondary to medial deviation of the bilateral internal carotid arteries. No visible canal hematoma. Disc levels: Moderate to severe degenerative disc disease and facet uncovertebral hypertrophy throughout the cervical spine, worst at C5-C6. Focal central disc protrusion at C2-C3 mildly indenting the ventral cord. Upper chest: Negative. Other: Atherosclerotic vascular calcifications at the bilateral carotid bifurcations. IMPRESSION: 1.  No acute intracranial abnormality. 2. No acute cervical spine fracture. Mildly prominent prevertebral soft tissues are likely due to medial deviation of the bilateral internal carotid arteries. If unable to clear the cervical spine, MRI can be considered for further evaluation to evaluate for ligamentous injury. 3. Moderate to severe degenerative changes throughout the cervical spine. Electronically Signed   By: Titus Dubin M.D.   On: 05/19/2017 09:52   Ct Cervical Spine Wo Contrast  Result Date: 05/19/2017 CLINICAL DATA:  Fall.  Hit left side of head. EXAM: CT HEAD WITHOUT CONTRAST CT CERVICAL SPINE WITHOUT CONTRAST TECHNIQUE: Multidetector CT imaging of the head and cervical spine was performed following the standard protocol without intravenous contrast. Multiplanar CT image reconstructions of the cervical spine were also generated. COMPARISON:  Cervical spine x-rays dated August 17, 2014. MRI brain dated August 17, 2013. FINDINGS: CT HEAD FINDINGS Brain: No evidence of acute infarction, hemorrhage, hydrocephalus, extra-axial collection or mass lesion/mass effect. The stable moderate cerebral atrophy and chronic microvascular ischemic white matter disease. Vascular: Atherosclerotic vascular calcification of the carotid siphons. No hyperdense vessel. Skull: Normal. Negative for fracture or focal lesion. Sinuses/Orbits: No acute finding. Other: Small left frontal scalp hematoma. CT CERVICAL SPINE FINDINGS Alignment: Unchanged trace retrolisthesis at C5-C6. No traumatic malalignment. Skull base and vertebrae: No acute fracture. No primary bone lesion or focal pathologic process. Soft tissues and spinal canal: Mildly prominent prevertebral soft tissues secondary to medial deviation of the bilateral internal carotid arteries. No visible canal hematoma. Disc levels: Moderate to severe degenerative disc disease and facet uncovertebral hypertrophy throughout the cervical spine, worst at C5-C6. Focal central disc protrusion at C2-C3 mildly indenting the ventral cord. Upper chest: Negative. Other: Atherosclerotic vascular calcifications at the bilateral carotid bifurcations. IMPRESSION: 1.  No acute intracranial abnormality. 2. No acute cervical spine fracture. Mildly prominent prevertebral soft tissues are likely due to medial deviation of the bilateral internal carotid arteries. If unable to clear the cervical spine, MRI can  be considered for further evaluation to evaluate for ligamentous injury. 3. Moderate to severe degenerative changes throughout the cervical spine. Electronically Signed   By: Titus Dubin M.D.   On: 05/19/2017 09:52    Procedures .[name] KitchenLaceration Repair Date/Time: 05/19/2017 11:01 AM Performed by: Recardo Evangelist, PA-C Authorized by: Janetta Hora  Lelan Pons, PA-C   Consent:    Consent obtained:  Verbal   Consent given by:  Patient   Risks discussed:  Pain   Alternatives discussed:  No treatment Anesthesia (see MAR for exact dosages):    Anesthesia method:  Local infiltration   Local anesthetic:  Lidocaine 2% WITH epi Laceration details:    Location:  Face   Face location:  L eyebrow   Length (cm):  4   Depth (mm):  10 Repair type:    Repair type:  Simple Pre-procedure details:    Preparation:  Patient was prepped and draped in usual sterile fashion Exploration:    Hemostasis achieved with:  Epinephrine   Wound exploration: wound explored through full range of motion and entire depth of wound probed and visualized     Wound extent: no muscle damage noted     Contaminated: no   Treatment:    Area cleansed with:  Shur-Clens   Amount of cleaning:  Standard   Irrigation solution:  Sterile saline   Irrigation volume:  10cc   Irrigation method:  Pressure wash   Visualized foreign bodies/material removed: no   Skin repair:    Repair method:  Sutures   Suture size:  6-0   Suture material:  Prolene   Number of sutures:  9 Approximation:    Approximation:  Close   Vermilion border: well-aligned   Post-procedure details:    Dressing:  Sterile dressing and antibiotic ointment   Patient tolerance of procedure:  Tolerated well, no immediate complications .[name] KitchenLaceration Repair Date/Time: 05/19/2017 11:02 AM Performed by: Recardo Evangelist, PA-C Authorized by: Recardo Evangelist, PA-C   Consent:    Consent obtained:  Verbal   Consent given by:  Patient   Risks discussed:  Pain    Alternatives discussed:  No treatment Anesthesia (see MAR for exact dosages):    Anesthesia method:  Local infiltration   Local anesthetic:  Lidocaine 2% WITH epi Laceration details:    Location:  Face   Face location:  Forehead   Length (cm):  1   Depth (mm):  2 Repair type:    Repair type:  Simple Pre-procedure details:    Preparation:  Patient was prepped and draped in usual sterile fashion Exploration:    Hemostasis achieved with:  Epinephrine   Wound exploration: wound explored through full range of motion and entire depth of wound probed and visualized     Wound extent: no muscle damage noted     Contaminated: no   Treatment:    Area cleansed with:  Shur-Clens   Amount of cleaning:  Standard   Irrigation solution:  Sterile saline   Irrigation method:  Pressure wash   Visualized foreign bodies/material removed: no   Skin repair:    Repair method:  Sutures   Suture size:  6-0   Suture material:  Prolene   Number of sutures:  1 Approximation:    Approximation:  Close   Vermilion border: well-aligned   Post-procedure details:    Dressing:  Non-adherent dressing   Patient tolerance of procedure:  Tolerated well, no immediate complications    (including critical care time)    Medications Ordered in ED Medications  lidocaine-EPINEPHrine (XYLOCAINE W/EPI) 2 %-1:200000 (PF) injection 20 mL (not administered)     Initial Impression / Assessment and Plan / ED Course  I have reviewed the triage vital signs and the nursing notes.  Pertinent labs & imaging results that were available during my care of  the patient were reviewed by me and considered in my medical decision making (see chart for details).  83 year old with head injury and facial laceration due to mechanical fall. CT head and neck are negative. Lacerations were repaired and irrigated in the ED. Bottom of the wound visualized and bleeding controlled. 9 sutures placed in bottom wound and 1 place in higher wound.  Wound care discussed and advised to return to have stitches removed in 5 days. Return precautions discussed.    Final Clinical Impressions(s) / ED Diagnoses   Final diagnoses:  Fall, initial encounter  Laceration of scalp, initial encounter    ED Discharge Orders    None       Recardo Evangelist, PA-C 05/19/17 1104    Pattricia Boss, MD 05/19/17 1339

## 2017-05-19 NOTE — ED Notes (Signed)
Patient transported to CT 

## 2017-05-19 NOTE — ED Notes (Signed)
Lidocaine/Epi placed at the bedside for MD

## 2017-05-27 DIAGNOSIS — S0181XD Laceration without foreign body of other part of head, subsequent encounter: Secondary | ICD-10-CM | POA: Diagnosis not present

## 2017-05-27 DIAGNOSIS — Z4802 Encounter for removal of sutures: Secondary | ICD-10-CM | POA: Diagnosis not present

## 2017-06-26 DIAGNOSIS — D485 Neoplasm of uncertain behavior of skin: Secondary | ICD-10-CM | POA: Diagnosis not present

## 2017-06-26 DIAGNOSIS — L905 Scar conditions and fibrosis of skin: Secondary | ICD-10-CM | POA: Diagnosis not present

## 2017-09-11 DIAGNOSIS — M1711 Unilateral primary osteoarthritis, right knee: Secondary | ICD-10-CM | POA: Diagnosis not present

## 2017-09-11 DIAGNOSIS — M25561 Pain in right knee: Secondary | ICD-10-CM | POA: Diagnosis not present

## 2017-09-13 DIAGNOSIS — Z1231 Encounter for screening mammogram for malignant neoplasm of breast: Secondary | ICD-10-CM | POA: Diagnosis not present

## 2017-09-18 DIAGNOSIS — M25561 Pain in right knee: Secondary | ICD-10-CM | POA: Diagnosis not present

## 2017-09-18 DIAGNOSIS — M1711 Unilateral primary osteoarthritis, right knee: Secondary | ICD-10-CM | POA: Diagnosis not present

## 2017-10-28 DIAGNOSIS — R42 Dizziness and giddiness: Secondary | ICD-10-CM | POA: Diagnosis not present

## 2017-10-28 DIAGNOSIS — H6122 Impacted cerumen, left ear: Secondary | ICD-10-CM | POA: Diagnosis not present

## 2017-10-28 DIAGNOSIS — R5383 Other fatigue: Secondary | ICD-10-CM | POA: Diagnosis not present

## 2017-10-28 DIAGNOSIS — N183 Chronic kidney disease, stage 3 (moderate): Secondary | ICD-10-CM | POA: Diagnosis not present

## 2017-10-28 DIAGNOSIS — I129 Hypertensive chronic kidney disease with stage 1 through stage 4 chronic kidney disease, or unspecified chronic kidney disease: Secondary | ICD-10-CM | POA: Diagnosis not present

## 2017-10-28 DIAGNOSIS — Z79899 Other long term (current) drug therapy: Secondary | ICD-10-CM | POA: Diagnosis not present

## 2017-10-30 DIAGNOSIS — Z7289 Other problems related to lifestyle: Secondary | ICD-10-CM | POA: Diagnosis not present

## 2017-10-30 DIAGNOSIS — H903 Sensorineural hearing loss, bilateral: Secondary | ICD-10-CM | POA: Diagnosis not present

## 2017-10-30 DIAGNOSIS — Z974 Presence of external hearing-aid: Secondary | ICD-10-CM | POA: Diagnosis not present

## 2017-10-30 DIAGNOSIS — Z87891 Personal history of nicotine dependence: Secondary | ICD-10-CM | POA: Diagnosis not present

## 2017-10-30 DIAGNOSIS — H6123 Impacted cerumen, bilateral: Secondary | ICD-10-CM | POA: Diagnosis not present

## 2017-12-02 DIAGNOSIS — N390 Urinary tract infection, site not specified: Secondary | ICD-10-CM | POA: Diagnosis not present

## 2018-01-27 DIAGNOSIS — M25812 Other specified joint disorders, left shoulder: Secondary | ICD-10-CM | POA: Diagnosis not present

## 2018-01-27 DIAGNOSIS — M25811 Other specified joint disorders, right shoulder: Secondary | ICD-10-CM | POA: Diagnosis not present

## 2018-01-27 DIAGNOSIS — M25512 Pain in left shoulder: Secondary | ICD-10-CM | POA: Diagnosis not present

## 2018-01-27 DIAGNOSIS — M25511 Pain in right shoulder: Secondary | ICD-10-CM | POA: Diagnosis not present

## 2018-02-24 IMAGING — CT CT CHEST W/O CM
2 of 3 series · 15 of 36 positions shown, 18 images · non-contrast
Comparison: Chest CT 09/27/2015.

CLINICAL DATA: 88-year-old female with history of pain in the upper
chest, neck and shoulders since lunch time today. Shortness of
breath.

EXAM:
CT CHEST WITHOUT CONTRAST
TECHNIQUE: Multidetector CT imaging of the chest was performed following the
standard protocol without IV contrast.

[Series 4: thorax 2.0 · axial · 0.67mm/px · z∈[+1116,+1362]mm · 12 of 145 slices shown, 15 images]
[im 11/145  mediastinal]
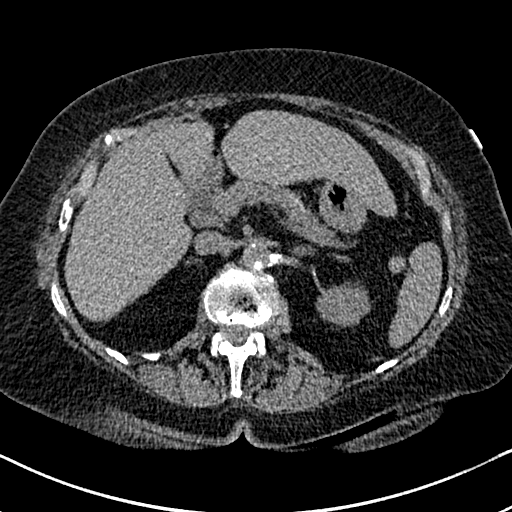
[im 11/145  lung]
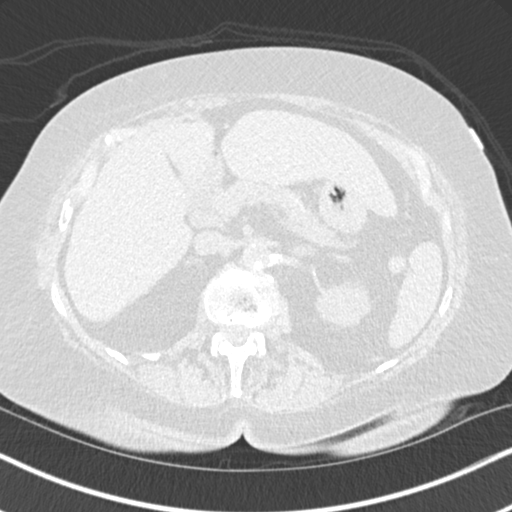
[im 22/145  lung]
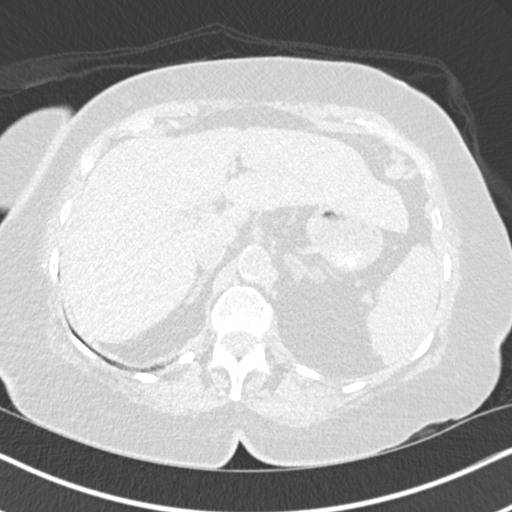
[im 33/145  lung]
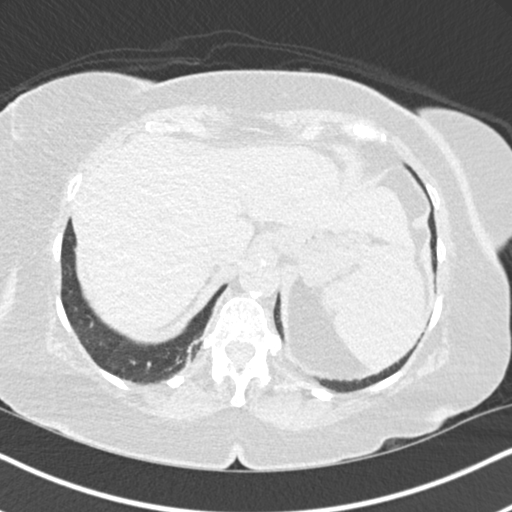
[im 43/145  lung]
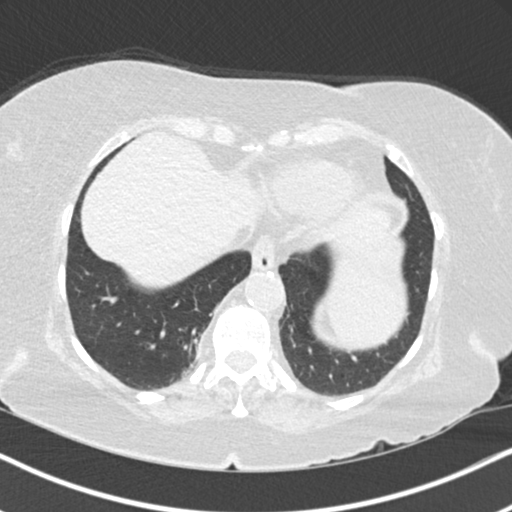
[im 54/145  mediastinal]
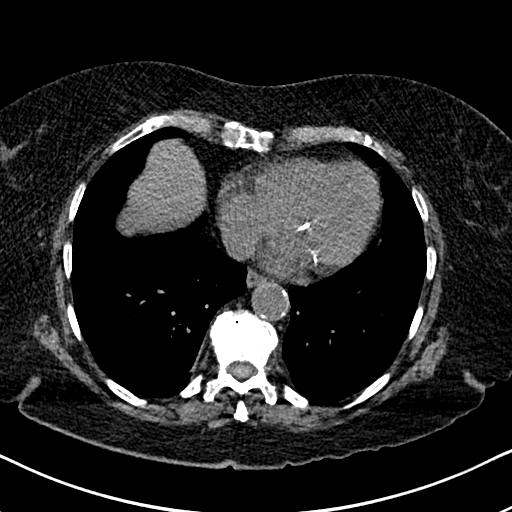
[im 54/145  lung]
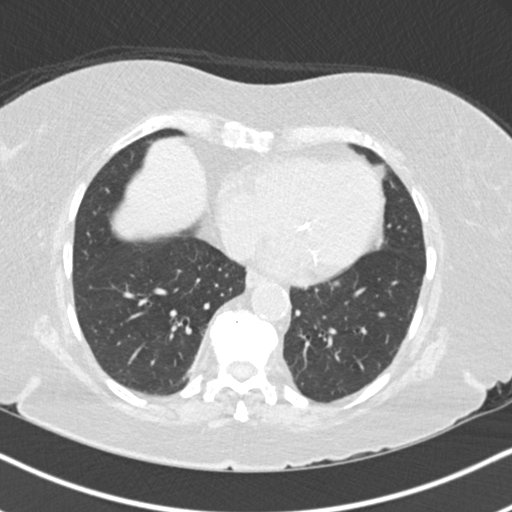
[im 65/145  lung]
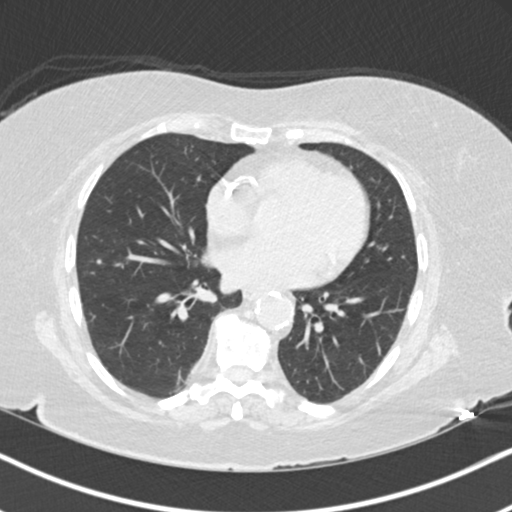
[im 81/145  lung]
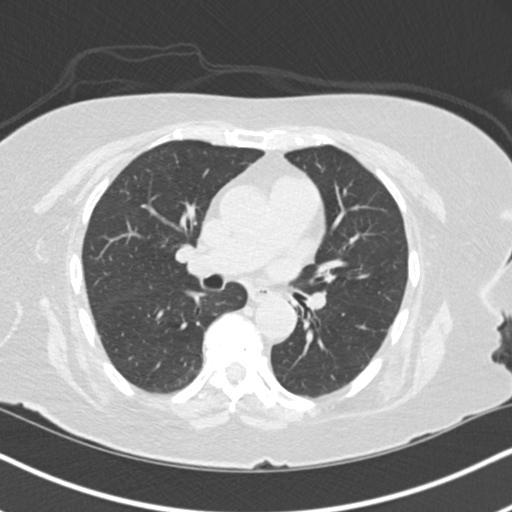
[im 91/145  lung]
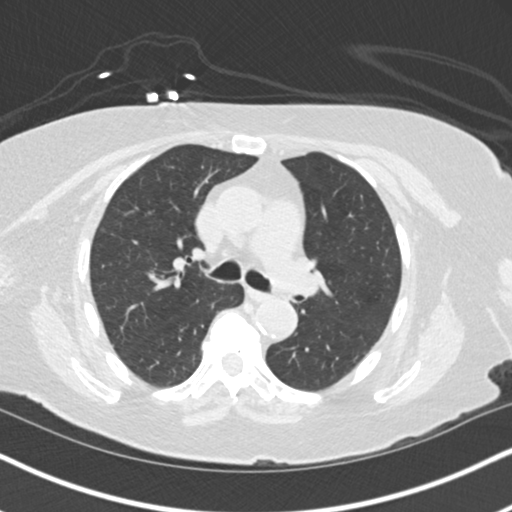
[im 102/145  mediastinal]
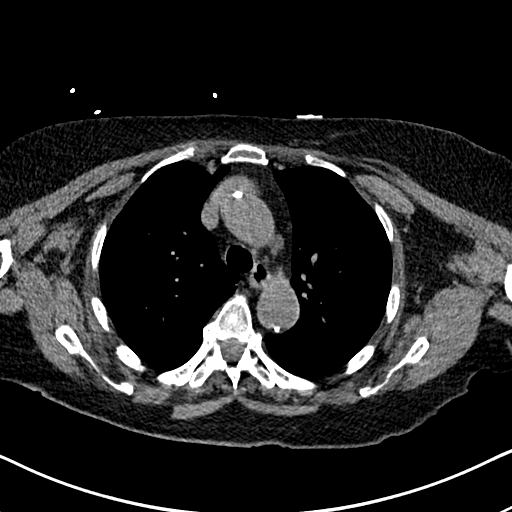
[im 102/145  lung]
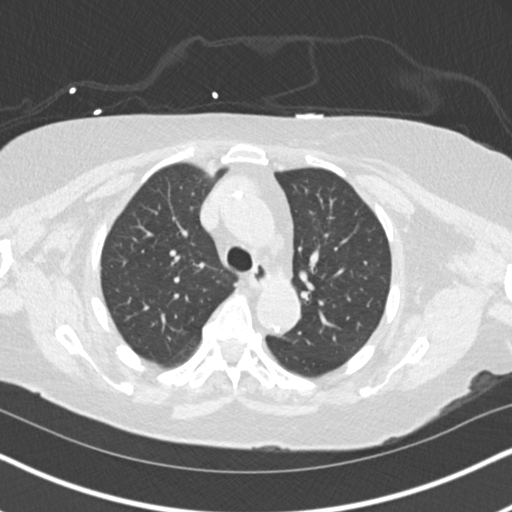
[im 113/145  lung]
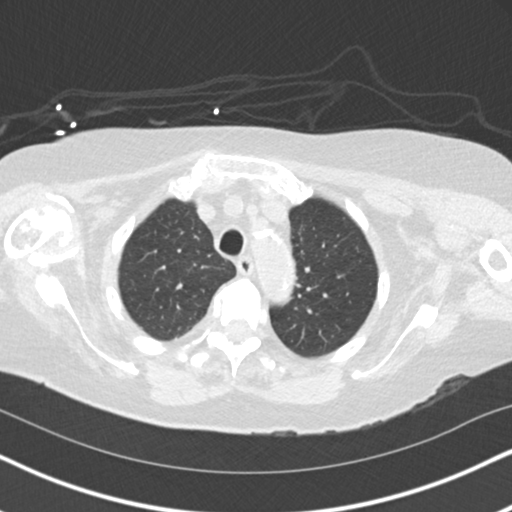
[im 123/145  lung]
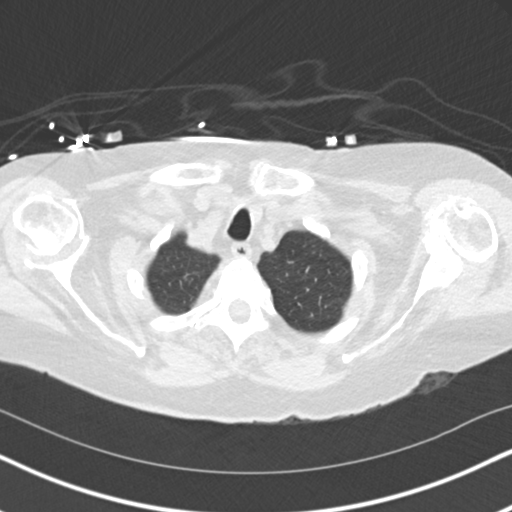
[im 134/145  lung]
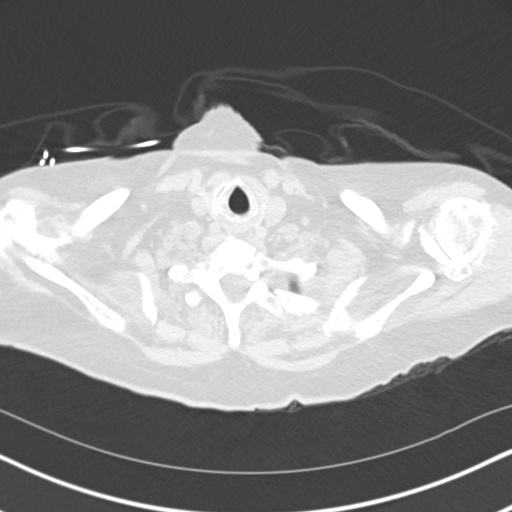

[Series 6: coronal · coronal · 0.59mm/px · 3 of 85 slices shown]
[im 17/85  lung]
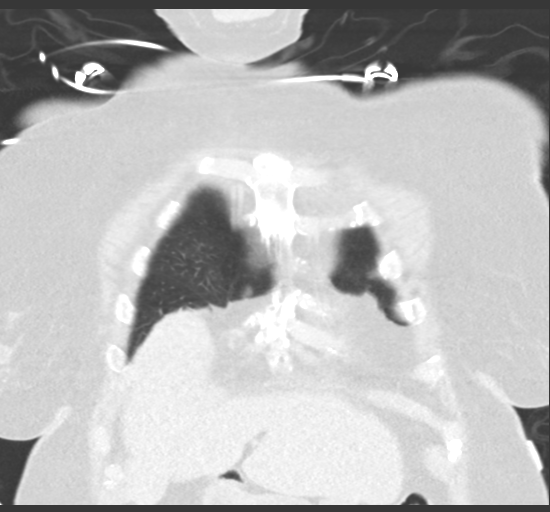
[im 34/85  lung]
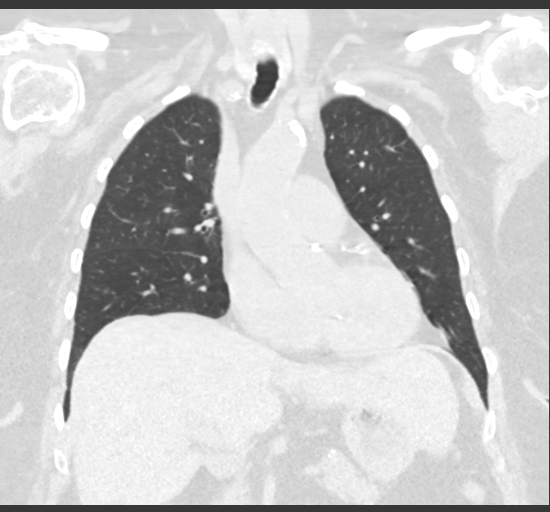
[im 51/85  lung]
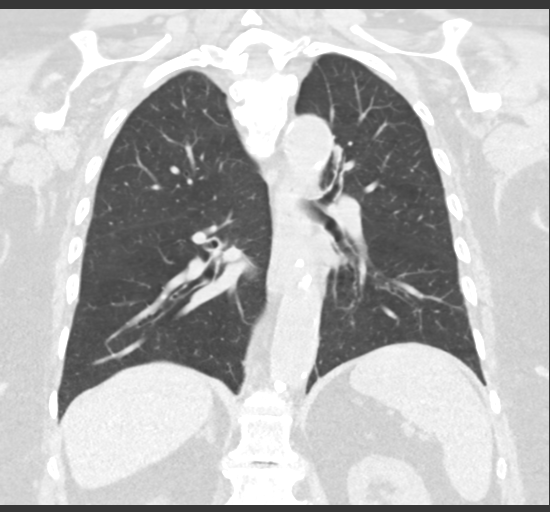

[15 of 36 positions shown; findings below may reference images not displayed]

FINDINGS: Cardiovascular: Heart size is normal. There is no significant
pericardial fluid, thickening or pericardial calcification. There is
aortic atherosclerosis, as well as atherosclerosis of the great
vessels of the mediastinum and the coronary arteries, including
calcified atherosclerotic plaque in the left main, left anterior
descending, left circumflex and right coronary arteries.

Mediastinum/Nodes: No pathologically enlarged mediastinal or hilar
lymph nodes. Please note that accurate exclusion of hilar adenopathy
is limited on noncontrast CT scans. Esophagus is unremarkable in
appearance. No axillary lymphadenopathy.

Lungs/Pleura: No acute consolidative airspace disease. No pleural
effusions. A few scattered tiny pulmonary nodules are noted in the
lungs bilaterally, the largest of which measures only 4 mm in the
lateral segment of the right middle lobe (image 83 of series 8), all
of which are unchanged and considered benign. In addition, there are
2 small finger-like densities in the lungs, in the left upper lobe
on image 42 of series 8, and in the posterior aspect of the right
lower lobe on image 84 of series 8, both of which are unchanged in
retrospect compared to the prior examination, favored to reflect
areas of mucoid impaction within distal bronchi and bronchioles. No
other larger more suspicious appearing pulmonary nodules or masses
are noted. No acute consolidative airspace disease. No pleural
effusions. Mild scarring in the medial aspect of the right lower
lobe.

Upper Abdomen: Aortic atherosclerosis.

Musculoskeletal: There are no aggressive appearing lytic or blastic
lesions noted in the visualized portions of the skeleton.
IMPRESSION: 1. No acute findings in the thorax to account for the patient's
symptoms.
2. Aortic atherosclerosis, in addition to left main and 3 vessel
coronary artery disease.
3. Multiple small pulmonary nodules measuring 4 mm or less in size,
stable compared to prior examinations, considered benign.
4. Finger-like densities in the left upper lobe and medial right
lower lobe, similar to prior examination 09/27/2015, favored to
reflect areas of chronic mucoid impaction within distal bronchi and
terminal bronchioles.

## 2018-02-24 IMAGING — DX DG CHEST 2V
2 series · 2 of 2 positions shown · non-contrast
Comparison: CT of the chest 09/27/2015

CLINICAL DATA: Shortness of breath and chest pain.

EXAM:
CHEST  2 VIEW

[chest pa]
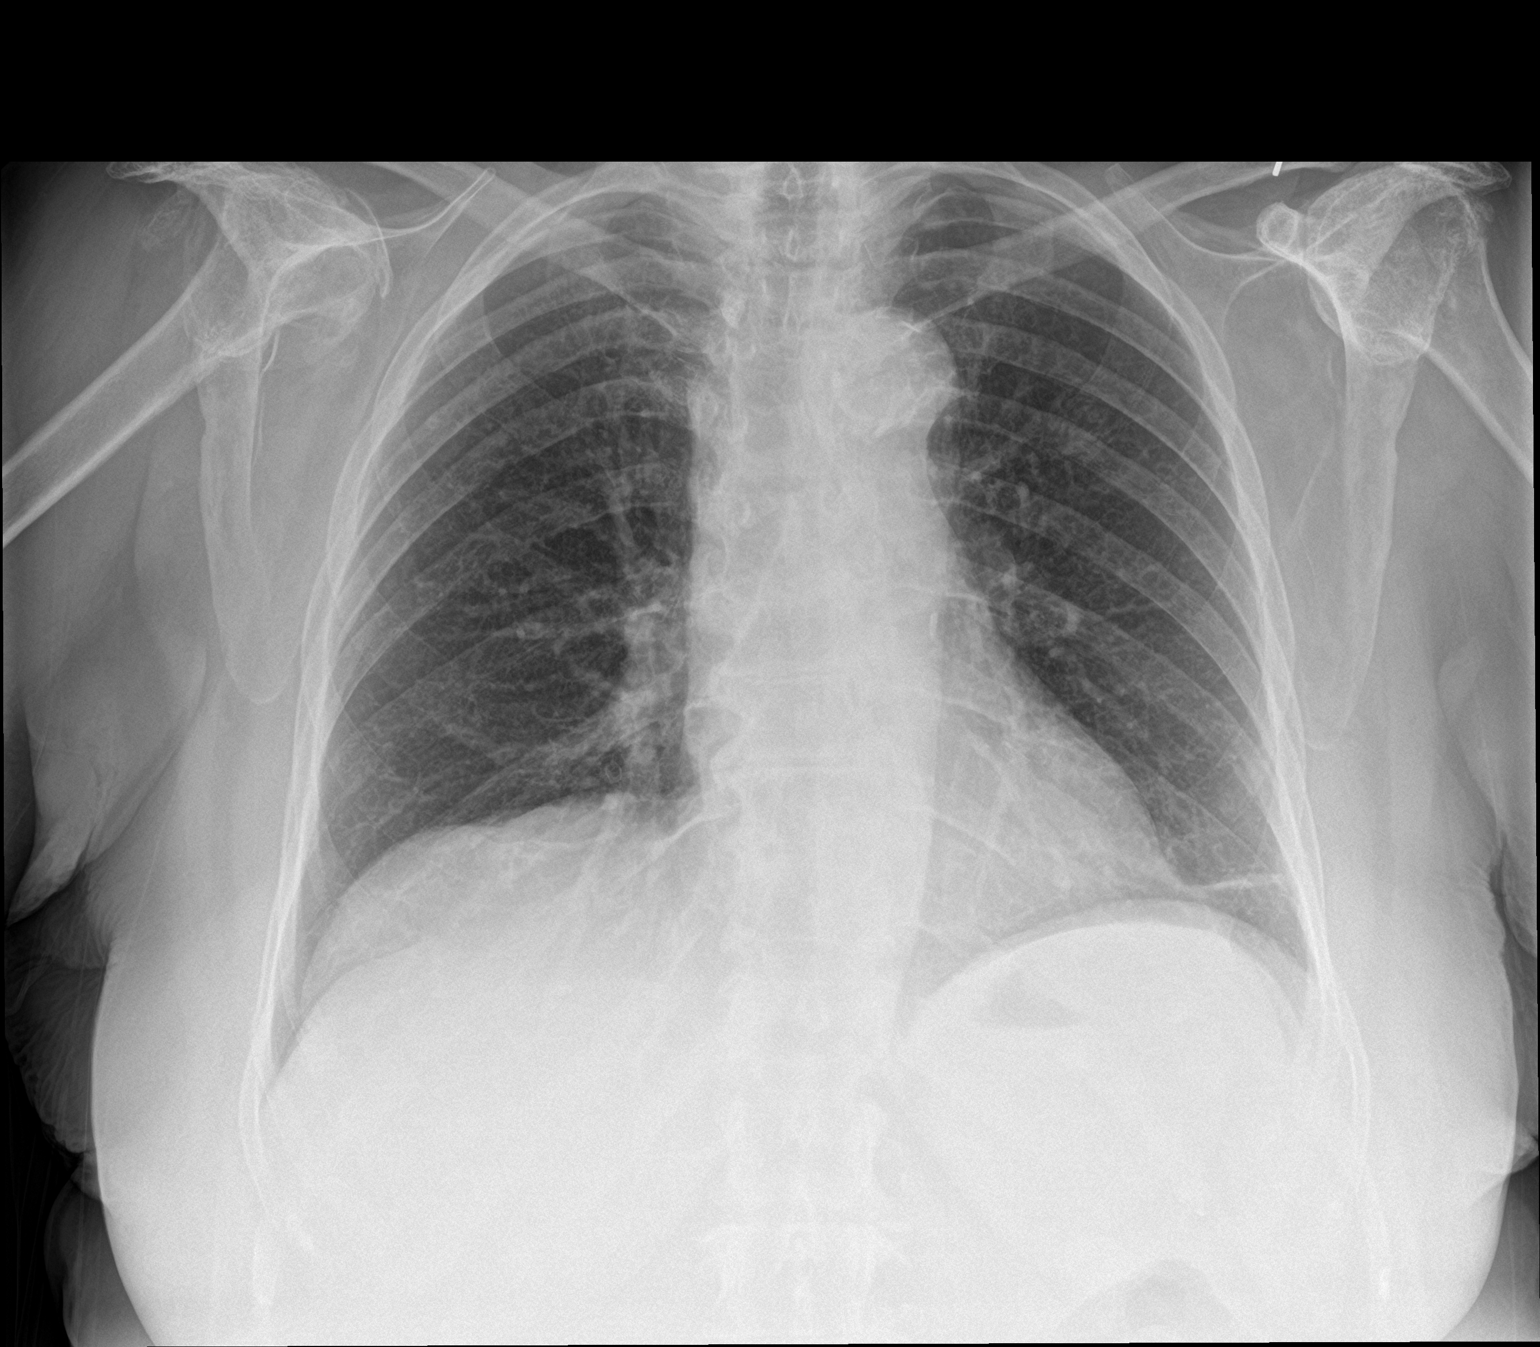

[chest lat]
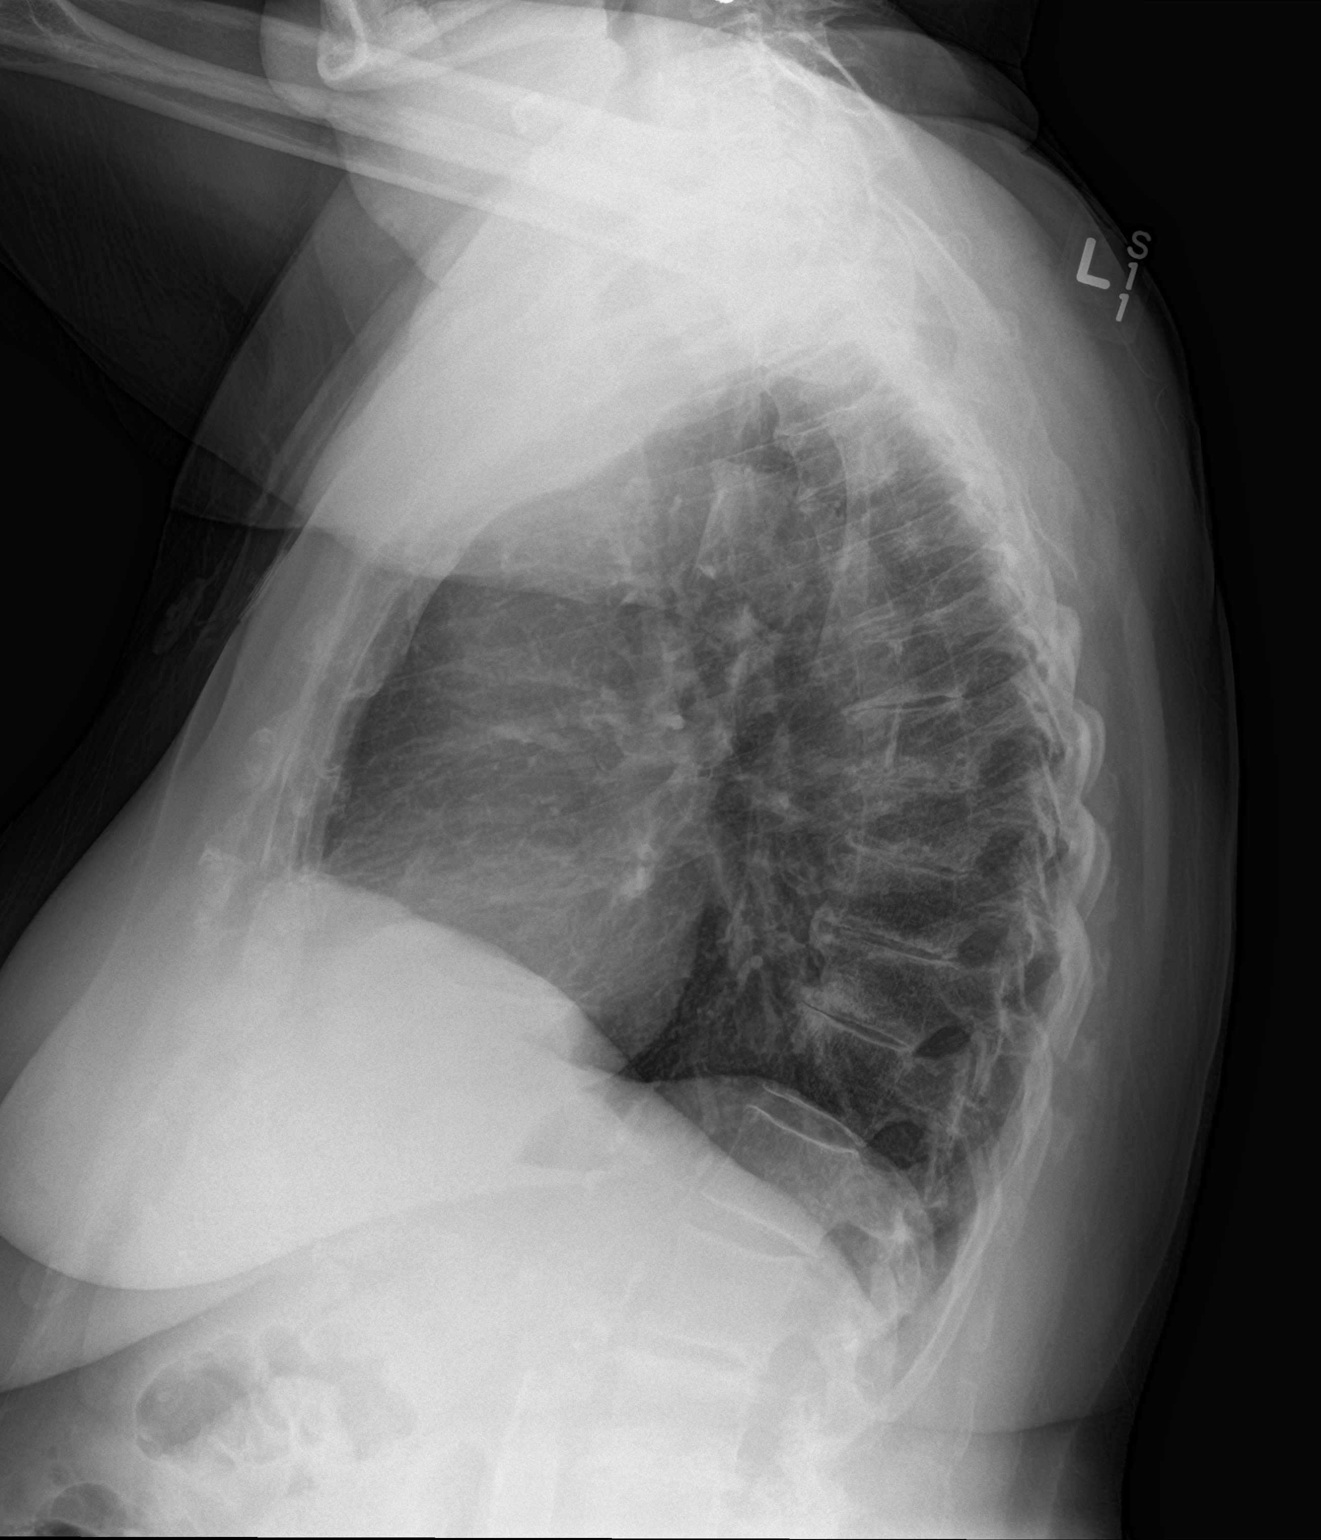

[2 of 2 positions shown; findings below may reference images not displayed]

FINDINGS: Cardiomediastinal silhouette is normal. Mediastinal contours appear
intact. Tortuosity and calcific atherosclerotic disease of the aorta
present.

There is no evidence of focal airspace consolidation, pleural
effusion or pneumothorax.

Osseous structures are without acute abnormality. Bilateral severe
arthropathy of the glenohumeral joints. Soft tissues are grossly
normal.
IMPRESSION: No active cardiopulmonary disease.

## 2018-03-15 DIAGNOSIS — Z23 Encounter for immunization: Secondary | ICD-10-CM | POA: Diagnosis not present

## 2018-05-01 DIAGNOSIS — Z79899 Other long term (current) drug therapy: Secondary | ICD-10-CM | POA: Diagnosis not present

## 2018-05-01 DIAGNOSIS — N183 Chronic kidney disease, stage 3 (moderate): Secondary | ICD-10-CM | POA: Diagnosis not present

## 2018-05-01 DIAGNOSIS — M542 Cervicalgia: Secondary | ICD-10-CM | POA: Diagnosis not present

## 2018-05-01 DIAGNOSIS — Z1389 Encounter for screening for other disorder: Secondary | ICD-10-CM | POA: Diagnosis not present

## 2018-05-01 DIAGNOSIS — Z0000 Encounter for general adult medical examination without abnormal findings: Secondary | ICD-10-CM | POA: Diagnosis not present

## 2018-05-01 DIAGNOSIS — E7800 Pure hypercholesterolemia, unspecified: Secondary | ICD-10-CM | POA: Diagnosis not present

## 2018-05-01 DIAGNOSIS — F5101 Primary insomnia: Secondary | ICD-10-CM | POA: Diagnosis not present

## 2018-05-01 DIAGNOSIS — I2510 Atherosclerotic heart disease of native coronary artery without angina pectoris: Secondary | ICD-10-CM | POA: Diagnosis not present

## 2018-05-01 DIAGNOSIS — I129 Hypertensive chronic kidney disease with stage 1 through stage 4 chronic kidney disease, or unspecified chronic kidney disease: Secondary | ICD-10-CM | POA: Diagnosis not present

## 2018-05-01 DIAGNOSIS — J4540 Moderate persistent asthma, uncomplicated: Secondary | ICD-10-CM | POA: Diagnosis not present

## 2018-05-01 DIAGNOSIS — M67432 Ganglion, left wrist: Secondary | ICD-10-CM | POA: Diagnosis not present

## 2018-05-13 DIAGNOSIS — M25512 Pain in left shoulder: Secondary | ICD-10-CM | POA: Diagnosis not present

## 2018-05-13 DIAGNOSIS — M25511 Pain in right shoulder: Secondary | ICD-10-CM | POA: Diagnosis not present

## 2018-05-13 DIAGNOSIS — M2560 Stiffness of unspecified joint, not elsewhere classified: Secondary | ICD-10-CM | POA: Diagnosis not present

## 2018-05-13 DIAGNOSIS — M542 Cervicalgia: Secondary | ICD-10-CM | POA: Diagnosis not present

## 2018-05-15 DIAGNOSIS — M2560 Stiffness of unspecified joint, not elsewhere classified: Secondary | ICD-10-CM | POA: Diagnosis not present

## 2018-05-15 DIAGNOSIS — M542 Cervicalgia: Secondary | ICD-10-CM | POA: Diagnosis not present

## 2018-05-15 DIAGNOSIS — M25511 Pain in right shoulder: Secondary | ICD-10-CM | POA: Diagnosis not present

## 2018-05-15 DIAGNOSIS — M25512 Pain in left shoulder: Secondary | ICD-10-CM | POA: Diagnosis not present

## 2018-07-28 DIAGNOSIS — N183 Chronic kidney disease, stage 3 (moderate): Secondary | ICD-10-CM | POA: Diagnosis not present

## 2018-07-28 DIAGNOSIS — D692 Other nonthrombocytopenic purpura: Secondary | ICD-10-CM | POA: Diagnosis not present

## 2018-07-28 DIAGNOSIS — I129 Hypertensive chronic kidney disease with stage 1 through stage 4 chronic kidney disease, or unspecified chronic kidney disease: Secondary | ICD-10-CM | POA: Diagnosis not present

## 2018-07-28 DIAGNOSIS — K5901 Slow transit constipation: Secondary | ICD-10-CM | POA: Diagnosis not present

## 2018-11-17 DIAGNOSIS — J4540 Moderate persistent asthma, uncomplicated: Secondary | ICD-10-CM | POA: Diagnosis not present

## 2018-11-17 DIAGNOSIS — N183 Chronic kidney disease, stage 3 (moderate): Secondary | ICD-10-CM | POA: Diagnosis not present

## 2018-11-17 DIAGNOSIS — I129 Hypertensive chronic kidney disease with stage 1 through stage 4 chronic kidney disease, or unspecified chronic kidney disease: Secondary | ICD-10-CM | POA: Diagnosis not present

## 2018-12-04 DIAGNOSIS — M1711 Unilateral primary osteoarthritis, right knee: Secondary | ICD-10-CM | POA: Diagnosis not present

## 2018-12-11 DIAGNOSIS — M1711 Unilateral primary osteoarthritis, right knee: Secondary | ICD-10-CM | POA: Diagnosis not present

## 2018-12-18 DIAGNOSIS — M1711 Unilateral primary osteoarthritis, right knee: Secondary | ICD-10-CM | POA: Diagnosis not present

## 2019-01-29 DIAGNOSIS — H25813 Combined forms of age-related cataract, bilateral: Secondary | ICD-10-CM | POA: Diagnosis not present

## 2019-03-12 DIAGNOSIS — Z23 Encounter for immunization: Secondary | ICD-10-CM | POA: Diagnosis not present

## 2019-05-25 DIAGNOSIS — N1831 Chronic kidney disease, stage 3a: Secondary | ICD-10-CM | POA: Diagnosis not present

## 2019-05-25 DIAGNOSIS — J4540 Moderate persistent asthma, uncomplicated: Secondary | ICD-10-CM | POA: Diagnosis not present

## 2019-05-25 DIAGNOSIS — Z79899 Other long term (current) drug therapy: Secondary | ICD-10-CM | POA: Diagnosis not present

## 2019-05-25 DIAGNOSIS — I129 Hypertensive chronic kidney disease with stage 1 through stage 4 chronic kidney disease, or unspecified chronic kidney disease: Secondary | ICD-10-CM | POA: Diagnosis not present

## 2019-05-25 DIAGNOSIS — E7800 Pure hypercholesterolemia, unspecified: Secondary | ICD-10-CM | POA: Diagnosis not present

## 2019-05-25 DIAGNOSIS — R7309 Other abnormal glucose: Secondary | ICD-10-CM | POA: Diagnosis not present

## 2019-05-25 DIAGNOSIS — Z1389 Encounter for screening for other disorder: Secondary | ICD-10-CM | POA: Diagnosis not present

## 2019-05-25 DIAGNOSIS — Z0000 Encounter for general adult medical examination without abnormal findings: Secondary | ICD-10-CM | POA: Diagnosis not present

## 2019-08-10 ENCOUNTER — Ambulatory Visit: Payer: Medicare Other | Attending: Internal Medicine

## 2019-08-10 DIAGNOSIS — Z23 Encounter for immunization: Secondary | ICD-10-CM | POA: Insufficient documentation

## 2019-08-10 NOTE — Progress Notes (Signed)
   Covid-19 Vaccination Clinic  Name:  VAUGHN DIGHTON    MRN: [id number] DOB: [birth year]  08/10/2019  Ms. Moskowitz was observed post Covid-19 immunization for 15 minutes without incidence. She was provided with Vaccine Information Sheet and instruction to access the V-Safe system.   Ms. Mrotek was instructed to call 911 with any severe reactions post vaccine: [name] Kitchen Difficulty breathing  . Swelling of your face and throat  . A fast heartbeat  . A bad rash all over your body  . Dizziness and weakness    Immunizations Administered    Name Date Dose VIS Date Route   Pfizer COVID-19 Vaccine 08/10/2019  2:45 PM 0.3 mL 06/12/2019 Intramuscular   Manufacturer: [place]   Lot: [id number]   [place]: [id number]

## 2019-09-03 ENCOUNTER — Ambulatory Visit: Payer: Medicare Other | Attending: Internal Medicine

## 2019-09-03 DIAGNOSIS — Z23 Encounter for immunization: Secondary | ICD-10-CM | POA: Insufficient documentation

## 2019-09-03 NOTE — Progress Notes (Signed)
   Covid-19 Vaccination Clinic  Name:  [name]    MRN: [id number] DOB: 08/17/1927  09/03/2019  [name] was observed post Covid-19 immunization for 15 minutes without incident. She was provided with Vaccine Information Sheet and instruction to access the V-Safe system.   [name] was instructed to call 911 with any severe reactions post vaccine: [name] Kitchen Difficulty breathing  . Swelling of face and throat  . A fast heartbeat  . A bad rash all over body  . Dizziness and weakness   Immunizations Administered    Name Date Dose VIS Date Route   Pfizer COVID-19 Vaccine 09/03/2019 12:01 PM 0.3 mL 06/12/2019 Intramuscular   Manufacturer: [place]   Lot: [id number]   [place]: [id number]

## 2019-10-03 NOTE — Progress Notes (Signed)
Cardiology Office Note   Date:  10/05/2019   ID:  [name], DOB [birth year], MRN [id number]  PCP:  Lajean Manes, MD  Cardiologist: Dr. Ellyn Hack   No chief complaint on file.    History of Present Illness: [name] is a 86 y.o. female who present for ongoing assessment and management of CAD.  Per Dr. Allison Quarry note (copied for accuracy):  CAD/Cardiac History: Former patient of Dr. Terance Ice  a) 7/'02: 1st Cutting PTCA- OM2 --> restenosed 9/'02 NSTEMI--> 3.0 mm x 12 mm BMS-OM 2;  b) 11/'03 DOE w/ + Cardiolite - Cutter PTCA for ISR -- patent in 2004 (after False + Cardiolite);  c) 07/2012 - Unstable Angina (dicomfort across the upper chest, feels like 'sizzlng") -- PCI to proximal RCA with Promus Premier DES 2.75 mm x 20 mm (3.0 mm), patent OM2 stent ~10% ISR (HARDING) - at that time was cared for by Dr. Rollene Fare  d) 2D Echo 08/2012: EF 55-60%. GR 1 DD. Mild MR  e) Myoview 08/2014: Normal LV size and function. Normal wall motion. LOW RISK.  She was last seen by Dr. Ellyn Hack on 05/10/2017 with only complaints of shoulder and joint pain.  She was not very active due to arthritis.  She was without any cardiac symptoms.  She reported that her lovastatin dose was reduced from 20 mg daily to 10 mg daily because of arthralgias and LDL was well controlled.  Arthralgia symptoms were improved.  She comes today without any cardiac complaints.  She is followed routinely every 6 months by her primary care physician for labs.  She continues to have issues with both shoulders but she does not wish surgical intervention at this time.  She is medically compliant.  Labs are being followed also by her primary care physician.  Past Medical History:  Diagnosis Date  . Adenomatous colon polyp MAY 2013   HOSPITALIZED AT [facility] WITH HGB 7 -TRANSFUSED-MASS FOUND IN ASCENDING COLON  . Aortic sclerosis    With murmur  . Asthma   . CAD S/P percutaneous coronary angioplasty 2002, 2003,2014   a)  Unstable Angina 7/'02: 1st Cutting PTCA- OM2 --> restenosed 9/'02 NSTEMI--> 3.0 mm x 12 mm BMS-OM 2; b) 11/'03 DOE w/ + Cardiolite - Cutter PTCA for ISR -- patent in 2004 (after False + Cardiolite); c) 07/2012 - Unstable Angina -- PCI to proximal RCA with Promus Premier DES 2.75 mm x 20 mm (3.0 mm), patent OM2 stent ~10% ISR (HARDING)  . Complication of anesthesia 2009   HIP REPLACEMENT-PT HAD HARD TIME WAKING UP--FELT [place]  . Coronary stent restenosis due to scar tissue 05/2002   Cutting Balloon PTCA to OM2 BMS  . Diverticulosis 10/2012   Seen on Colonoscopy  . DJD (degenerative joint disease), thoracolumbar    Back, Hips & Knees  . Essential hypertension   . H/O Colon cancer 10/2011   GI - Drs. Carlean Purl - colonoscopy 10/2012: diverticulosis, stable ileocolic anastomosis of R colon  . Hyperlipidemia with target LDL less than 70   . Internal hemorrhoids   . Iron deficiency anemia due to chronic blood loss 10/2011   Blood Transfusion (EGD 09/2012 - unremarkable)  . Medication intolerance - Plavix, Effient, Brilinta    Easy bruising, GI bleeds (led to Dx of Colon CA), Brilinta - dyspnea  . Multiple lung nodules on CT 08/2012   scattered, bilateral but right>left.   . Non-Q wave ST elevation myocardial infarction (STEMI) involving left circumflex coronary artery  03/2001   a) Severe thrombic ISR of prior PTCA site in OM2 --> BMS PCI; b) Echo 08/2012: Normal LV size & function.  EF 55-60%, no regional WMA, Gr 1 DD, mild MR, mild-mod TR - NO Pulmonary HTN  . Osteoarthritis (arthritis due to wear and tear of joints)    Bilateral Hip Arthroplasty, L Knee TKA  . Osteoporosis     Past Surgical History:  Procedure Laterality Date  . APPENDECTOMY    . CHOLECYSTECTOMY    . COLONOSCOPY  11/16/2011   Procedure: COLONOSCOPY;  Surgeon: Gatha Mayer, MD;  Location: [place];  Service: Endoscopy;  Laterality: N/A;  . COLONOSCOPY  10/2012   Gessner: diverticulosis, stable R colon  ileocolic anastomosis  . COLONOSCOPY W/ BIOPSIES AND POLYPECTOMY  06/15/2008   adenomatous polyps, diverticulosis, internal hemorrhoids  . COLONOSCOPY WITH PROPOFOL N/A 07/18/2015   Procedure: COLONOSCOPY WITH PROPOFOL;  Surgeon: Gatha Mayer, MD;  Location: [facility];  Service: Endoscopy;  Laterality: N/A;  . CORONARY ANGIOPLASTY WITH STENT PLACEMENT  2002; 2003; 07/2012   a)  7/'02: 1st Cutting PTCA- OM2 --> restenosed 9/'02 --> 3.0 mm x 12 mm BMS-OM 2; b) 11/'03 - Cutter PTCA for ISR -- patent in 2004;   . ESOPHAGOGASTRODUODENOSCOPY N/A 10/23/2012   Procedure: ESOPHAGOGASTRODUODENOSCOPY (EGD);  Surgeon: Inda Castle, MD;  Location: [place];  Service: Endoscopy;  Laterality: N/A;  . INCISIONAL HERNIA REPAIR N/A 01/29/2013   Procedure: LAPAROSCOPIC INCISIONAL HERNIA;  Surgeon: Harl Bowie, MD;  Location: [facility];  Service: General;  Laterality: N/A;  . INSERTION OF MESH N/A 01/29/2013   Procedure: INSERTION OF MESH;  Surgeon: Harl Bowie, MD;  Location: [facility];  Service: General;  Laterality: N/A;  . LEFT HEART CATHETERIZATION WITH CORONARY ANGIOGRAM N/A 07/15/2012   Procedure: LEFT HEART CATHETERIZATION WITH CORONARY ANGIOGRAM;  Surgeon: Leonie Man, MD;  Location: [facility] CATH LAB: pRCA 80-90%, OM2 BMS ~10% ISR  . NM MYOVIEW LTD  08/2014    Normal LV size and function. Normal wall motion. LOW RISK.  [name] Kitchen PERCUTANEOUS CORONARY STENT INTERVENTION (PCI-S)  07/15/2012   Procedure: PERCUTANEOUS CORONARY STENT INTERVENTION (PCI-S);  Surgeon: Leonie Man, MD;  Location: [facility] CATH LAB: c) PCI to proximal RCA with Promus Premier DES 2.75 mm x 20 mm (3.0 mm)  . REPLACEMENT TOTAL KNEE Left 2003  . TOTAL HIP ARTHROPLASTY  09/08/2007   left, Dr. Wynelle Link  . TOTAL HIP ARTHROPLASTY Right 2003  . TRANSTHORACIC ECHOCARDIOGRAM  08/2012   Normal LV size & function.  EF 55-60%, no regional WMA, Gr 1 DD, mild MR, mild-mod TR; aortic sclerosis without stenosis     Current Outpatient  Medications  Medication Sig Dispense Refill  . acetaminophen (TYLENOL) 650 MG CR tablet Take 650 mg by mouth every 8 (eight) hours as needed for pain.    [name] Kitchen albuterol (PROVENTIL HFA;VENTOLIN HFA) 108 (90 BASE) MCG/ACT inhaler Inhale 2 puffs into the lungs every 6 (six) hours as needed for wheezing.    [name] Kitchen aspirin EC 81 MG tablet Take 81 mg by mouth daily.    [name] Kitchen CALCIUM-MAGNESIUM-ZINC PO Take 1 tablet 3 (three) times a week by mouth.    . fluticasone (FLONASE) 50 MCG/ACT nasal spray Place into the nose as needed.    . Loratadine-Pseudoephedrine (PX ALLERGY RELIEF D, LORATID, PO) Take 1 tablet daily by mouth.    . lovastatin (MEVACOR) 20 MG tablet Take 10 mg by mouth daily.    . Magnesium 250 MG  TABS Take 250 mg 4 (four) times a week by mouth.    . Melatonin 3 MG CAPS Take by mouth daily.    . metoprolol (LOPRESSOR) 50 MG tablet Take 25 mg by mouth 2 (two) times daily.     . nitroGLYCERIN (NITROSTAT) 0.4 MG SL tablet DISSOLVE 1 TABLET UNDER THE TONGUE EVERY 5 MINUTES AS NEEDED FOR CHEST PAIN 25 tablet 6  . traMADol (ULTRAM) 50 MG tablet Take 50 mg by mouth every 6 (six) hours as needed for moderate pain.     [name] Kitchen triamterene-hydrochlorothiazide (DYAZIDE) 37.5-25 MG capsule Take 1 capsule daily by mouth.     No current facility-administered medications for this visit.    Allergies:   Atorvastatin, Brilinta [ticagrelor], Clopidogrel bisulfate, Codeine, and Sulfonamide derivatives    Social History:  The patient  reports that she quit smoking about 37 years ago. Her smoking use included cigarettes. She quit after 30.00 years of use. She has never used smokeless tobacco. She reports current alcohol use. She reports that she does not use drugs.   Family History:  The patient's family history includes Colon cancer in her brother and sister; Heart disease in her mother and sister; Hypertension in her mother; Melanoma in her brother; Pancreatic cancer in her brother and father; Prostate cancer in her brother  and father; Skin cancer in her father.    ROS: All other systems are reviewed and negative. Unless otherwise mentioned in H&P    PHYSICAL EXAM: VS:  BP 128/64   Pulse 81   Ht 5' (1.524 m)   Wt 148 lb 6.4 oz (67.3 kg)   SpO2 97%   BMI 28.98 kg/m  , BMI Body mass index is 28.98 kg/m. GEN: Well nourished, well developed, in no acute distress, frail HEENT: normal Neck: no JVD, carotid bruits, or masses Cardiac: RRR; no murmurs, rubs, or gallops,no edema  Respiratory:  Clear to auscultation bilaterally, normal work of breathing GI: soft, nontender, nondistended, + BS MS: no deformity or atrophy Skin: warm and dry, no rash Neuro:  Strength and sensation are intact Psych: euthymic mood, full affect   EKG: Normal sinus rhythm heart rate of 81 bpm (personally reviewed)  Recent Labs: No results found for requested labs within last 8760 hours.    Lipid Panel    Component Value Date/Time   CHOL 122 10/23/2012 0612   TRIG 116 10/23/2012 0612   HDL 67 10/23/2012 0612   CHOLHDL 1.8 10/23/2012 0612   VLDL 23 10/23/2012 0612   LDLCALC 32 10/23/2012 0612      Wt Readings from Last 3 Encounters:  10/05/19 148 lb 6.4 oz (67.3 kg)  05/19/17 158 lb (71.7 kg)  05/10/17 158 lb (71.7 kg)      Other studies Reviewed: NM stress test Oct 20, 2014  Exercise Capacity:  Lexiscan with no exercise. BP Response:  Normal blood pressure response. Clinical Symptoms:  No significant symptoms noted. ECG Impression:  No significant ST segment change suggestive of ischemia. Comparison with Prior Nuclear Study: No images to compare  Overall Impression:  Normal stress nuclear study.  LV Wall Motion:  NL LV Function; NL Wall Motion  ASSESSMENT AND PLAN:  1.  Coronary artery disease: She offers no complaints of chest discomfort, dyspnea on exertion, fatigue, or dizziness.  Do to this I will not plan on any ischemic testing at this time.  2.  Hypertension: Blood pressure is well controlled  currently.  We will check an echocardiogram to evaluate her LV function.  Otherwise unless this is abnormal, we will plan on seeing her in a year and continue her current medication regimen.  3.  Hyperlipidemia: She continues on lovastatin 10 mg daily.  This was recently reduced by her PCP in the setting of low LDL.  PCP is following her labs every 6 months.  4.Chronic Shoulder Pain: Followed by PCP and orthopedics.  They have offered her surgical repair but she is refusing at this time.   Current medicines are reviewed at length with the patient today.  I have spent 25 minutes  dedicated to the care of this patient on the date of this encounter to include pre-visit review of records, assessment, management and diagnostic testing,with shared decision making.  Labs/ tests ordered today include: Echocardiogram  Phill Myron. West Pugh, ANP, AACC   10/05/2019 12:16 PM    [facility] [place] [address] Office [phone] Fax [phone]  Notice: This dictation was prepared with Dragon dictation along with smaller phrase technology. Any transcriptional errors that result from this process are unintentional and may not be corrected upon review.

## 2019-10-05 ENCOUNTER — Ambulatory Visit (INDEPENDENT_AMBULATORY_CARE_PROVIDER_SITE_OTHER): Payer: Medicare Other | Admitting: Adult Health

## 2019-10-05 ENCOUNTER — Encounter: Payer: Self-pay | Admitting: Adult Health

## 2019-10-05 ENCOUNTER — Other Ambulatory Visit: Payer: Self-pay

## 2019-10-05 VITALS — BP 128/64 | HR 81 | Ht 60.0 in | Wt 148.4 lb

## 2019-10-05 DIAGNOSIS — I10 Essential (primary) hypertension: Secondary | ICD-10-CM | POA: Diagnosis not present

## 2019-10-05 DIAGNOSIS — E7800 Pure hypercholesterolemia, unspecified: Secondary | ICD-10-CM | POA: Diagnosis not present

## 2019-10-05 DIAGNOSIS — I519 Heart disease, unspecified: Secondary | ICD-10-CM | POA: Diagnosis not present

## 2019-10-05 DIAGNOSIS — I2510 Atherosclerotic heart disease of native coronary artery without angina pectoris: Secondary | ICD-10-CM

## 2019-10-05 NOTE — Patient Instructions (Signed)
Medication Instructions:  Continue current medications  *If you need a refill on your cardiac medications before your next appointment, please call your pharmacy*   Lab Work: None Ordered  Testing/Procedures: Your physician has requested that you have an echocardiogram. Echocardiography is a painless test that uses sound waves to create images of your heart. It provides your doctor with information about the size and shape of your heart and how well your heart's chambers and valves are working. This procedure takes approximately one hour. There are no restrictions for this procedure.   Follow-Up: At [facility], you and your health needs are our priority.  As part of our continuing mission to provide you with exceptional heart care, we have created designated Provider Care Teams.  These Care Teams include your primary Cardiologist (physician) and Advanced Practice Providers (APPs -  Physician Assistants and Nurse Practitioners) who all work together to provide you with the care you need, when you need it.  We recommend signing up for the patient portal called "MyChart".  Sign up information is provided on this After Visit Summary.  MyChart is used to connect with patients for Virtual Visits (Telemedicine).  Patients are able to view lab/test results, encounter notes, upcoming appointments, etc.  Non-urgent messages can be sent to your provider as well.   To learn more about what you can do with MyChart, go to [email or web address].    Your next appointment:   1 year(s)  The format for your next appointment:   In Person  Provider:   You may see Glenetta Hew, MD or one of the following Advanced Practice Providers on your designated Care Team:    Rosaria Ferries, PA-C  Jory Sims, DNP, ANP  Cadence Kathlen Mody, NP

## 2019-10-09 ENCOUNTER — Other Ambulatory Visit (INDEPENDENT_AMBULATORY_CARE_PROVIDER_SITE_OTHER): Payer: Medicare Other

## 2019-10-09 DIAGNOSIS — I10 Essential (primary) hypertension: Secondary | ICD-10-CM

## 2019-10-21 ENCOUNTER — Other Ambulatory Visit (HOSPITAL_COMMUNITY): Payer: Medicare Other

## 2019-11-24 DIAGNOSIS — I129 Hypertensive chronic kidney disease with stage 1 through stage 4 chronic kidney disease, or unspecified chronic kidney disease: Secondary | ICD-10-CM | POA: Diagnosis not present

## 2019-11-24 DIAGNOSIS — L299 Pruritus, unspecified: Secondary | ICD-10-CM | POA: Diagnosis not present

## 2019-11-24 DIAGNOSIS — K5901 Slow transit constipation: Secondary | ICD-10-CM | POA: Diagnosis not present

## 2019-11-24 DIAGNOSIS — N1831 Chronic kidney disease, stage 3a: Secondary | ICD-10-CM | POA: Diagnosis not present

## 2019-11-24 DIAGNOSIS — D692 Other nonthrombocytopenic purpura: Secondary | ICD-10-CM | POA: Diagnosis not present

## 2019-12-15 ENCOUNTER — Encounter (HOSPITAL_COMMUNITY): Payer: Self-pay | Admitting: Emergency Medicine

## 2019-12-15 ENCOUNTER — Emergency Department (HOSPITAL_COMMUNITY)
Admission: EM | Admit: 2019-12-15 | Discharge: 2019-12-15 | Discharge status: Against Medical Advice | Payer: Medicare Other | Attending: Emergency Medicine | Admitting: Emergency Medicine

## 2019-12-15 DIAGNOSIS — M549 Dorsalgia, unspecified: Secondary | ICD-10-CM | POA: Diagnosis present

## 2019-12-15 DIAGNOSIS — Z5321 Procedure and treatment not carried out due to patient leaving prior to being seen by health care provider: Secondary | ICD-10-CM | POA: Diagnosis not present

## 2019-12-15 NOTE — ED Notes (Signed)
Max [phone]) called/would like a call back on her status and would like to come visit.  Thank you

## 2019-12-15 NOTE — ED Triage Notes (Signed)
Pt. Stated, I fell last week on my butt and my my back is still hurting and feels  like its worse.

## 2019-12-15 NOTE — ED Notes (Signed)
Pt named called to be room, no response. Tried contacting family by number provided, no response.

## 2019-12-16 ENCOUNTER — Ambulatory Visit
Admission: RE | Admit: 2019-12-16 | Discharge: 2019-12-16 | Disposition: A | Discharge status: Home / Self Care | Payer: Medicare Other | Source: Ambulatory Visit | Attending: Geriatric Medicine | Admitting: Geriatric Medicine

## 2019-12-16 ENCOUNTER — Other Ambulatory Visit: Payer: Self-pay | Admitting: Geriatric Medicine

## 2019-12-16 DIAGNOSIS — H903 Sensorineural hearing loss, bilateral: Secondary | ICD-10-CM | POA: Diagnosis not present

## 2019-12-16 DIAGNOSIS — M545 Low back pain, unspecified: Secondary | ICD-10-CM

## 2019-12-16 DIAGNOSIS — N1831 Chronic kidney disease, stage 3a: Secondary | ICD-10-CM | POA: Diagnosis not present

## 2019-12-16 DIAGNOSIS — I129 Hypertensive chronic kidney disease with stage 1 through stage 4 chronic kidney disease, or unspecified chronic kidney disease: Secondary | ICD-10-CM | POA: Diagnosis not present

## 2019-12-16 DIAGNOSIS — H6122 Impacted cerumen, left ear: Secondary | ICD-10-CM | POA: Diagnosis not present

## 2020-06-02 DIAGNOSIS — Z1389 Encounter for screening for other disorder: Secondary | ICD-10-CM | POA: Diagnosis not present

## 2020-06-02 DIAGNOSIS — I129 Hypertensive chronic kidney disease with stage 1 through stage 4 chronic kidney disease, or unspecified chronic kidney disease: Secondary | ICD-10-CM | POA: Diagnosis not present

## 2020-06-02 DIAGNOSIS — N1831 Chronic kidney disease, stage 3a: Secondary | ICD-10-CM | POA: Diagnosis not present

## 2020-06-02 DIAGNOSIS — E7800 Pure hypercholesterolemia, unspecified: Secondary | ICD-10-CM | POA: Diagnosis not present

## 2020-06-02 DIAGNOSIS — R7303 Prediabetes: Secondary | ICD-10-CM | POA: Diagnosis not present

## 2020-06-02 DIAGNOSIS — Z79899 Other long term (current) drug therapy: Secondary | ICD-10-CM | POA: Diagnosis not present

## 2020-06-02 DIAGNOSIS — Z0000 Encounter for general adult medical examination without abnormal findings: Secondary | ICD-10-CM | POA: Diagnosis not present

## 2020-07-08 DIAGNOSIS — Z20822 Contact with and (suspected) exposure to covid-19: Secondary | ICD-10-CM | POA: Diagnosis not present

## 2020-07-08 DIAGNOSIS — M791 Myalgia, unspecified site: Secondary | ICD-10-CM | POA: Diagnosis not present

## 2020-08-16 DIAGNOSIS — H905 Unspecified sensorineural hearing loss: Secondary | ICD-10-CM | POA: Diagnosis not present

## 2020-09-05 DIAGNOSIS — H25813 Combined forms of age-related cataract, bilateral: Secondary | ICD-10-CM | POA: Diagnosis not present

## 2020-09-05 DIAGNOSIS — H524 Presbyopia: Secondary | ICD-10-CM | POA: Diagnosis not present

## 2020-09-05 DIAGNOSIS — H52203 Unspecified astigmatism, bilateral: Secondary | ICD-10-CM | POA: Diagnosis not present

## 2020-09-05 DIAGNOSIS — H5203 Hypermetropia, bilateral: Secondary | ICD-10-CM | POA: Diagnosis not present

## 2020-11-04 DIAGNOSIS — M170 Bilateral primary osteoarthritis of knee: Secondary | ICD-10-CM | POA: Diagnosis not present

## 2020-11-04 DIAGNOSIS — M1711 Unilateral primary osteoarthritis, right knee: Secondary | ICD-10-CM | POA: Diagnosis not present

## 2020-12-08 DIAGNOSIS — J4540 Moderate persistent asthma, uncomplicated: Secondary | ICD-10-CM | POA: Diagnosis not present

## 2020-12-08 DIAGNOSIS — I129 Hypertensive chronic kidney disease with stage 1 through stage 4 chronic kidney disease, or unspecified chronic kidney disease: Secondary | ICD-10-CM | POA: Diagnosis not present

## 2020-12-08 DIAGNOSIS — R7303 Prediabetes: Secondary | ICD-10-CM | POA: Diagnosis not present

## 2020-12-08 DIAGNOSIS — N1831 Chronic kidney disease, stage 3a: Secondary | ICD-10-CM | POA: Diagnosis not present

## 2020-12-08 DIAGNOSIS — D692 Other nonthrombocytopenic purpura: Secondary | ICD-10-CM | POA: Diagnosis not present

## 2020-12-29 DIAGNOSIS — M1711 Unilateral primary osteoarthritis, right knee: Secondary | ICD-10-CM | POA: Diagnosis not present

## 2021-01-05 DIAGNOSIS — M1711 Unilateral primary osteoarthritis, right knee: Secondary | ICD-10-CM | POA: Diagnosis not present

## 2021-01-12 DIAGNOSIS — M1711 Unilateral primary osteoarthritis, right knee: Secondary | ICD-10-CM | POA: Diagnosis not present

## 2021-08-09 DIAGNOSIS — R7303 Prediabetes: Secondary | ICD-10-CM | POA: Diagnosis not present

## 2021-08-09 DIAGNOSIS — I129 Hypertensive chronic kidney disease with stage 1 through stage 4 chronic kidney disease, or unspecified chronic kidney disease: Secondary | ICD-10-CM | POA: Diagnosis not present

## 2021-08-09 DIAGNOSIS — Z23 Encounter for immunization: Secondary | ICD-10-CM | POA: Diagnosis not present

## 2021-08-09 DIAGNOSIS — Z0000 Encounter for general adult medical examination without abnormal findings: Secondary | ICD-10-CM | POA: Diagnosis not present

## 2021-08-09 DIAGNOSIS — E7800 Pure hypercholesterolemia, unspecified: Secondary | ICD-10-CM | POA: Diagnosis not present

## 2021-08-09 DIAGNOSIS — Z79899 Other long term (current) drug therapy: Secondary | ICD-10-CM | POA: Diagnosis not present

## 2021-08-09 DIAGNOSIS — D692 Other nonthrombocytopenic purpura: Secondary | ICD-10-CM | POA: Diagnosis not present

## 2021-08-09 DIAGNOSIS — Z1389 Encounter for screening for other disorder: Secondary | ICD-10-CM | POA: Diagnosis not present

## 2021-08-09 DIAGNOSIS — J4540 Moderate persistent asthma, uncomplicated: Secondary | ICD-10-CM | POA: Diagnosis not present

## 2021-08-09 DIAGNOSIS — M542 Cervicalgia: Secondary | ICD-10-CM | POA: Diagnosis not present

## 2021-08-09 DIAGNOSIS — H6123 Impacted cerumen, bilateral: Secondary | ICD-10-CM | POA: Diagnosis not present

## 2021-08-09 DIAGNOSIS — N1831 Chronic kidney disease, stage 3a: Secondary | ICD-10-CM | POA: Diagnosis not present

## 2021-09-11 DIAGNOSIS — H25813 Combined forms of age-related cataract, bilateral: Secondary | ICD-10-CM | POA: Diagnosis not present

## 2021-09-11 DIAGNOSIS — H5203 Hypermetropia, bilateral: Secondary | ICD-10-CM | POA: Diagnosis not present

## 2021-09-11 DIAGNOSIS — H52203 Unspecified astigmatism, bilateral: Secondary | ICD-10-CM | POA: Diagnosis not present

## 2021-09-11 DIAGNOSIS — H524 Presbyopia: Secondary | ICD-10-CM | POA: Diagnosis not present

## 2021-09-27 DIAGNOSIS — H2511 Age-related nuclear cataract, right eye: Secondary | ICD-10-CM | POA: Diagnosis not present

## 2021-09-27 DIAGNOSIS — H25011 Cortical age-related cataract, right eye: Secondary | ICD-10-CM | POA: Diagnosis not present

## 2021-09-27 DIAGNOSIS — H25812 Combined forms of age-related cataract, left eye: Secondary | ICD-10-CM | POA: Diagnosis not present

## 2021-10-04 DIAGNOSIS — H2512 Age-related nuclear cataract, left eye: Secondary | ICD-10-CM | POA: Diagnosis not present

## 2021-10-04 DIAGNOSIS — H25012 Cortical age-related cataract, left eye: Secondary | ICD-10-CM | POA: Diagnosis not present

## 2021-10-19 DIAGNOSIS — R9389 Abnormal findings on diagnostic imaging of other specified body structures: Secondary | ICD-10-CM | POA: Diagnosis not present

## 2022-02-14 DIAGNOSIS — R7303 Prediabetes: Secondary | ICD-10-CM | POA: Diagnosis not present

## 2022-02-14 DIAGNOSIS — I129 Hypertensive chronic kidney disease with stage 1 through stage 4 chronic kidney disease, or unspecified chronic kidney disease: Secondary | ICD-10-CM | POA: Diagnosis not present

## 2022-02-14 DIAGNOSIS — R000 Tachycardia, unspecified: Secondary | ICD-10-CM | POA: Diagnosis not present

## 2022-02-14 DIAGNOSIS — Z79899 Other long term (current) drug therapy: Secondary | ICD-10-CM | POA: Diagnosis not present

## 2022-02-14 DIAGNOSIS — R9389 Abnormal findings on diagnostic imaging of other specified body structures: Secondary | ICD-10-CM | POA: Diagnosis not present

## 2022-03-04 NOTE — H&P (Signed)
[name] is an 88 y.o. postmenopausal G0 who is admitted for Hysteroscopy with Dilation and Curettage, possible Myosure polypectomy, and pap smear for postmenopausal bleeding and thickened endometrium on [place] (2.14cm).  Patient was initially evaluated 09/2021 for postmenopausal bleeding which has been occurring on and off. Initial evaluation performed by Evlyn Kanner, FNP-C and due to stenotic cervical os, unable to obtain endometrial sampling outpatient. She was unsure if she desired to proceed with evaluation at her age but has decided she would like to proceed. She was counseled extensively that the thickened endometrium could represent a cancer.  TVUS (09/18/2021): Uterus 5.27 x 4.15 x 3.28cm Endometrial thickness 2.14cm Retroverted uterus. No uterine anomalies seen. Endometrium thickened and echogenic -- endometrium irregular and lobular in appearance with mass-like effect within 2.9 x 1.7cm, positive blood flow noted within endometrium. Neither ovary visualized. No adnexal masses seen.  Patient Active Problem List   Diagnosis Date Noted   Leukocytosis 09/15/2016   Change in bowel habits    History of colon cancer    Atypical chest pain 09/19/2014   Obesity (BMI 30-39.9) 03/14/2014   Medication intolerance - Plavix, Effient, Brilinta    Hyperlipidemia with target LDL less than 70    Essential hypertension    Diverticulosis of colon without hemorrhage 03/16/2013   Incisional hernia, without obstruction or gangrene 11/28/2012   Asthma 10/22/2012   Carcinoma of ascending colon ([place]) 11/16/2011   PERSONAL HX COLONIC POLYPS 11/29/2009   S/P NSTEMI -- involving left circumflex coronary artery (OM2) In-stent Thrombosis 03/02/2001   CAD S/P PCI: OM2 PTCA 7/02, OM2 Stent 9/02, ISR 11/03 - PTCA; RCA DES 1 /14/14 12/31/2000   MEDICAL/FAMILY/SOCIAL HX: No LMP recorded. Patient is postmenopausal.    Past Medical History:  Diagnosis Date   Adenomatous colon polyp MAY 2013   HOSPITALIZED  AT [facility] WITH HGB 7 -TRANSFUSED-MASS FOUND IN ASCENDING COLON   Aortic sclerosis    With murmur   Asthma    CAD S/P percutaneous coronary angioplasty 2002, 2003,2014   a) Unstable Angina 7/'02: 1st Cutting PTCA- OM2 --> restenosed 9/'02 NSTEMI--> 3.0 mm x 12 mm BMS-OM 2; b) 11/'03 DOE w/ + Cardiolite - Cutter PTCA for ISR -- patent in 2004 (after False + Cardiolite); c) 07/2012 - Unstable Angina -- PCI to proximal RCA with Promus Premier DES 2.75 mm x 20 mm (3.0 mm), patent OM2 stent ~10% ISR (HARDING)   Complication of anesthesia 2009   HIP REPLACEMENT-PT HAD HARD TIME WAKING UP--FELT LIKE SHE COULDN'T BREATHE   Coronary stent restenosis due to scar tissue 05/2002   Cutting Balloon PTCA to OM2 BMS   Diverticulosis 10/2012   Seen on Colonoscopy   DJD (degenerative joint disease), thoracolumbar    Back, Hips & Knees   Essential hypertension    H/O Colon cancer 10/2011   GI - Drs. Carlean Purl - colonoscopy 10/2012: diverticulosis, stable ileocolic anastomosis of R colon   Hyperlipidemia with target LDL less than 70    Internal hemorrhoids    Iron deficiency anemia due to chronic blood loss 10/2011   Blood Transfusion (EGD 09/2012 - unremarkable)   Medication intolerance - Plavix, Effient, Brilinta    Easy bruising, GI bleeds (led to Dx of Colon CA), Brilinta - dyspnea   Multiple lung nodules on CT 08/2012   scattered, bilateral but right>left.    Non-Q wave ST elevation myocardial infarction (STEMI) involving left circumflex coronary artery 03/2001   a) Severe thrombic ISR of prior PTCA site in OM2 -->  BMS PCI; b) Echo 08/2012: Normal LV size & function.  EF 55-60%, no regional WMA, Gr 1 DD, mild MR, mild-mod TR - NO Pulmonary HTN   Osteoarthritis (arthritis due to wear and tear of joints)    Bilateral Hip Arthroplasty, L Knee TKA   Osteoporosis     Past Surgical History:  Procedure Laterality Date   APPENDECTOMY     CHOLECYSTECTOMY     COLONOSCOPY  11/16/2011   Procedure: COLONOSCOPY;  Surgeon:  Gatha Mayer, MD;  Location: [place];  Service: Endoscopy;  Laterality: N/A;   COLONOSCOPY  10/2012   Carlean Purl: diverticulosis, stable R colon ileocolic anastomosis   COLONOSCOPY W/ BIOPSIES AND POLYPECTOMY  06/15/2008   adenomatous polyps, diverticulosis, internal hemorrhoids   COLONOSCOPY WITH PROPOFOL N/A 07/18/2015   Procedure: COLONOSCOPY WITH PROPOFOL;  Surgeon: Gatha Mayer, MD;  [facility];  Service: Endoscopy;  Laterality: N/A;   CORONARY ANGIOPLASTY WITH STENT PLACEMENT  2002; 2003; 07/2012   a)  7/'02: 1st Cutting PTCA- OM2 --> restenosed 9/'02 --> 3.0 mm x 12 mm BMS-OM 2; b) 11/'03 - Cutter PTCA for ISR -- patent in 2004;    ESOPHAGOGASTRODUODENOSCOPY N/A 10/23/2012   Procedure: ESOPHAGOGASTRODUODENOSCOPY (EGD);  Surgeon: Inda Castle, MD;  Location: [place];  Service: Endoscopy;  Laterality: N/A;   INCISIONAL HERNIA REPAIR N/A 01/29/2013   Procedure: LAPAROSCOPIC INCISIONAL HERNIA;  Surgeon: Harl Bowie, MD;  [facility];  Service: General;  Laterality: N/A;   INSERTION OF MESH N/A 01/29/2013   Procedure: INSERTION OF MESH;  Surgeon: Harl Bowie, MD;  [facility];  Service: General;  Laterality: N/A;   LEFT HEART CATHETERIZATION WITH CORONARY ANGIOGRAM N/A 07/15/2012   Procedure: LEFT HEART CATHETERIZATION WITH CORONARY ANGIOGRAM;  Surgeon: Leonie Man, MD;  Location: [facility] CATH LAB: pRCA 80-90%, OM2 BMS ~10% ISR   NM MYOVIEW LTD  08/2014    Normal LV size and function. Normal wall motion. LOW RISK.   PERCUTANEOUS CORONARY STENT INTERVENTION (PCI-S)  07/15/2012   Procedure: PERCUTANEOUS CORONARY STENT INTERVENTION (PCI-S);  Surgeon: Leonie Man, MD;  Location: [facility] CATH LAB: c) PCI to proximal RCA with Promus Premier DES 2.75 mm x 20 mm (3.0 mm)   REPLACEMENT TOTAL KNEE Left 2003   TOTAL HIP ARTHROPLASTY  09/08/2007   left, Dr. Wynelle Link   TOTAL HIP ARTHROPLASTY Right 2003   TRANSTHORACIC ECHOCARDIOGRAM  08/2012   Normal LV size &  function.  EF 55-60%, no regional WMA, Gr 1 DD, mild MR, mild-mod TR; aortic sclerosis without stenosis    Family History  Problem Relation Age of Onset   Pancreatic cancer Father    Prostate cancer Father    Skin cancer Father    Heart disease Mother    Hypertension Mother    Pancreatic cancer Brother    Heart disease Sister    Colon cancer Brother        ? Colostomy   Prostate cancer Brother    Colon cancer Sister    Melanoma Brother     Social History:  reports that she quit smoking about 39 years ago. Her smoking use included cigarettes. She has never used smokeless tobacco. She reports current alcohol use. She reports that she does not use drugs.  ALLERGIES/MEDS:  Allergies:  Allergies  Allergen Reactions   Atorvastatin Shortness Of Breath   Brilinta [Ticagrelor] Shortness Of Breath    BLEEDING   Clopidogrel Bisulfate Shortness Of Breath    BRUISING  Codeine Nausea And Vomiting   Sulfonamide Derivatives Other (See Comments)    Intestinal upset    No medications prior to admission.     Review of Systems  Constitutional: Negative.   HENT: Negative.    Eyes: Negative.   Respiratory: Negative.    Cardiovascular: Negative.   Gastrointestinal: Negative.   Genitourinary: Negative.   Musculoskeletal: Negative.   Skin: Negative.   Neurological: Negative.   Endo/Heme/Allergies: Negative.   Psychiatric/Behavioral: Negative.      There were no vitals taken for this visit. Gen:  NAD, pleasant and cooperative Cardio:  RRR Pulm:  CTAB, no wheezes/rales/rhonchi Abd:  Soft, non-distended, non-tender throughout, no rebound/guarding Ext:  No bilateral LE edema, no bilateral calf tenderness  No results found for this or any previous visit (from the past 24 hour(s)).  No results found.   ASSESSMENT/PLAN: [name] is a 88 y.o. postmenopausal G0 who is admitted for Hysteroscopy with Dilation and Curettage, possible Myosure polypectomy, and pap smear for  postmenopausal bleeding and thickened endometrium on [place] (2.14cm).  - Admit to [place] labs (CBC, T&S, COVID screen per protocol) - Diet:  Per anesthesia - IVF:  Per anesthesia - VTE Prophylaxis:  SCDs - Antibiotics: None - Anticipate D/C home same-day  Consents: I discussed with the patient that this surgery is performed to look inside the uterus and remove the uterine lining.  Prior to surgery, the risks and benefits of the surgery, as well as alternative treatments, have been discussed.  The risks include, but are not limited to bleeding, including the need for a blood transfusion, infection, damage to organs and tissues, including uterine perforation, requiring additional surgery, postoperative pain, short-term and long-term, failure of the procedure to control symptoms, need for hysterectomy to control bleeding, fluid overload, which could create electrolyte abnormalities and the need to stop the procedure before completion, inability to safely complete the procedure, deep vein thrombosis and/or pulmonary embolism, painful intercourse, complications the course of which cannot be predicted or prevented, and death.  Patient was consented for blood products.  The patient is aware that bleeding may result in the need for a blood transfusion which includes risk of transmission of HIV (1:2 million), Hepatitis C (1:2 million), and Hepatitis B (1:200 thousand) and transfusion reaction.  Patient voiced understanding of the above risks as well as understanding of indications for blood transfusion.  Drema Dallas, DO

## 2022-03-08 DIAGNOSIS — Z01818 Encounter for other preprocedural examination: Secondary | ICD-10-CM | POA: Diagnosis not present

## 2022-03-11 NOTE — Progress Notes (Unsigned)
Cardiology Office Note:    Date:  03/12/2022   ID:  [name], DOB 04-Jul-1927, MRN [id number]  PCP:  Lajean Manes, [place] [facility] Prov[facility] Cardiologist:  Glenetta Hew, MD Cardiology APP:  Ledora Bottcher, PA {Referring MD: Lajean Manes, MD   Chief Complaint  Patient presents with   Follow-up    CAD    History of Present Illness:    [name] is a 88 y.o. female with a hx of CAD, HLD, and HTN. She has been maintained on ASA, 20 mg lovastatin, BB, and triamterene-HCTZ.    From prior note: CAD/Cardiac History: Former patient of Dr. Terance Ice  a) 7/'02: STEMI, 1st Cutting PTCA- OM2 --> restenosed 9/'02 NSTEMI--> 3.0 mm x 12 mm BMS-OM 2;  b) 11/'03 DOE w/ + Cardiolite - Cutter PTCA for ISR -- patent in 2004 (after False + Cardiolite);  c) 07/2012 - Unstable Angina (dicomfort across the upper chest, feels like 'sizzlng") -- PCI to proximal RCA with Promus Premier DES 2.75 mm x 20 mm (3.0 mm), patent OM2 stent ~10% ISR (HARDING) - at that time was cared for by Dr. Rollene Fare  d) 2D Echo 08/2012: EF 55-60%. GR 1 DD. Mild MR  e) Myoview 08/2014: Normal LV size and function. Normal wall motion. LOW RISK.  She was last seen by K. Lawrence NP 10/05/19 and was doing well at that time. Unfortunately, she has developed post-menopausal bleeding and is scheduled for I&D.  She was added to my schedule for preoperative evaluation and tachycardia. She denies tachycardia and is unsure of this diagnosis. EKG today is NSR. Due to abnormal rhythm on exam, I obtained a rhythm strip that showed: sinus rhythm with PACs.  When asked about chest pain, she states that she hurts all over.  Bilateral shoulder pain is relieved with Salonpas.   Past Medical History:  Diagnosis Date   Adenomatous colon polyp MAY 2013   HOSPITALIZED AT [facility] WITH HGB 7 -TRANSFUSED-MASS FOUND IN ASCENDING COLON   Aortic sclerosis    With murmur   Asthma    CAD S/P percutaneous coronary  angioplasty 2002, 2003,2014   a) Unstable Angina 7/'02: 1st Cutting PTCA- OM2 --> restenosed 9/'02 NSTEMI--> 3.0 mm x 12 mm BMS-OM 2; b) 11/'03 DOE w/ + Cardiolite - Cutter PTCA for ISR -- patent in 2004 (after False + Cardiolite); c) 07/2012 - Unstable Angina -- PCI to proximal RCA with Promus Premier DES 2.75 mm x 20 mm (3.0 mm), patent OM2 stent ~10% ISR (HARDING)   Complication of anesthesia 2009   HIP REPLACEMENT-PT HAD HARD TIME WAKING UP--FELT LIKE SHE COULDN'T BREATHE   Coronary stent restenosis due to scar tissue 05/2002   Cutting Balloon PTCA to OM2 BMS   Diverticulosis 10/2012   Seen on Colonoscopy   DJD (degenerative joint disease), thoracolumbar    Back, Hips & Knees   Essential hypertension    H/O Colon cancer 10/2011   GI - Drs. Carlean Purl - colonoscopy 10/2012: diverticulosis, stable ileocolic anastomosis of R colon   Hyperlipidemia with target LDL less than 70    Internal hemorrhoids    Iron deficiency anemia due to chronic blood loss 10/2011   Blood Transfusion (EGD 09/2012 - unremarkable)   Medication intolerance - Plavix, Effient, Brilinta    Easy bruising, GI bleeds (led to Dx of Colon CA), Brilinta - dyspnea   Multiple lung nodules on CT 08/2012   scattered, bilateral but right>left.    Non-Q wave  ST elevation myocardial infarction (STEMI) involving left circumflex coronary artery 03/2001   a) Severe thrombic ISR of prior PTCA site in OM2 --> BMS PCI; b) Echo 08/2012: Normal LV size & function.  EF 55-60%, no regional WMA, Gr 1 DD, mild MR, mild-mod TR - NO Pulmonary HTN   Osteoarthritis (arthritis due to wear and tear of joints)    Bilateral Hip Arthroplasty, L Knee TKA   Osteoporosis     Past Surgical History:  Procedure Laterality Date   APPENDECTOMY     CHOLECYSTECTOMY     COLONOSCOPY  11/16/2011   Procedure: COLONOSCOPY;  Surgeon: Gatha Mayer, MD;  Location: [place];  Service: Endoscopy;  Laterality: N/A;   COLONOSCOPY  10/2012   Carlean Purl: diverticulosis, stable  R colon ileocolic anastomosis   COLONOSCOPY W/ BIOPSIES AND POLYPECTOMY  06/15/2008   adenomatous polyps, diverticulosis, internal hemorrhoids   COLONOSCOPY WITH PROPOFOL N/A 07/18/2015   Procedure: COLONOSCOPY WITH PROPOFOL;  Surgeon: Gatha Mayer, MD;  Location: [facility];  Service: Endoscopy;  Laterality: N/A;   CORONARY ANGIOPLASTY WITH STENT PLACEMENT  2002; 2003; 07/2012   a)  7/'02: 1st Cutting PTCA- OM2 --> restenosed 9/'02 --> 3.0 mm x 12 mm BMS-OM 2; b) 11/'03 - Cutter PTCA for ISR -- patent in 2004;    ESOPHAGOGASTRODUODENOSCOPY N/A 10/23/2012   Procedure: ESOPHAGOGASTRODUODENOSCOPY (EGD);  Surgeon: Inda Castle, MD;  Location: [place];  Service: Endoscopy;  Laterality: N/A;   INCISIONAL HERNIA REPAIR N/A 01/29/2013   Procedure: LAPAROSCOPIC INCISIONAL HERNIA;  Surgeon: Harl Bowie, MD;  Location: [facility];  Service: General;  Laterality: N/A;   INSERTION OF MESH N/A 01/29/2013   Procedure: INSERTION OF MESH;  Surgeon: Harl Bowie, MD;  Location: [facility];  Service: General;  Laterality: N/A;   LEFT HEART CATHETERIZATION WITH CORONARY ANGIOGRAM N/A 07/15/2012   Procedure: LEFT HEART CATHETERIZATION WITH CORONARY ANGIOGRAM;  Surgeon: Leonie Man, MD;  Location: [facility] CATH LAB: pRCA 80-90%, OM2 BMS ~10% ISR   NM MYOVIEW LTD  08/2014    Normal LV size and function. Normal wall motion. LOW RISK.   PERCUTANEOUS CORONARY STENT INTERVENTION (PCI-S)  07/15/2012   Procedure: PERCUTANEOUS CORONARY STENT INTERVENTION (PCI-S);  Surgeon: Leonie Man, MD;  Location: [facility] CATH LAB: c) PCI to proximal RCA with Promus Premier DES 2.75 mm x 20 mm (3.0 mm)   REPLACEMENT TOTAL KNEE Left 2003   TOTAL HIP ARTHROPLASTY  09/08/2007   left, Dr. Wynelle Link   TOTAL HIP ARTHROPLASTY Right 2003   TRANSTHORACIC ECHOCARDIOGRAM  08/2012   Normal LV size & function.  EF 55-60%, no regional WMA, Gr 1 DD, mild MR, mild-mod TR; aortic sclerosis without stenosis    Current Medications: Current Meds   Medication Sig   acetaminophen (TYLENOL) 650 MG CR tablet Take 650 mg by mouth every 8 (eight) hours as needed for pain.   albuterol (PROVENTIL HFA;VENTOLIN HFA) 108 (90 BASE) MCG/ACT inhaler Inhale 2 puffs into the lungs every 6 (six) hours as needed for wheezing.   aspirin EC 81 MG tablet Take 81 mg by mouth as needed. Per  patient taking 2 to 3 times weekly   CALCIUM-MAGNESIUM-ZINC PO Take 1 tablet 3 (three) times a week by mouth.   fluticasone (FLONASE) 50 MCG/ACT nasal spray Place into the nose as needed.   Loratadine-Pseudoephedrine (PX ALLERGY RELIEF D, LORATID, PO) Take 1 tablet by mouth as needed.   lovastatin (MEVACOR) 20 MG tablet Take 10 mg by mouth daily.  Magnesium 250 MG TABS Take 250 mg 4 (four) times a week by mouth.   Melatonin 3 MG CAPS Take by mouth daily.   metoprolol (LOPRESSOR) 50 MG tablet Take 25 mg by mouth 2 (two) times daily.    nitroGLYCERIN (NITROSTAT) 0.4 MG SL tablet DISSOLVE 1 TABLET UNDER THE TONGUE EVERY 5 MINUTES AS NEEDED FOR CHEST PAIN   triamterene-hydrochlorothiazide (DYAZIDE) 37.5-25 MG capsule Take 1 capsule daily by mouth.     Allergies:   Atorvastatin, Brilinta [ticagrelor], Clopidogrel bisulfate, Codeine, and Sulfonamide derivatives   Social History   Socioeconomic History   Marital status: Married    Spouse name: Not on file   Number of children: 2   Years of education: Not on file   Highest education level: Not on file  Occupational History   Occupation: retired  Tobacco Use   Smoking status: Former    Years: 30.00    Types: Cigarettes    Quit date: 07/02/1982    Years since quitting: 39.7   Smokeless tobacco: Never  Substance and Sexual Activity   Alcohol use: Yes    Alcohol/week: 0.0 standard drinks of alcohol    Comment: occ wine   Drug use: No   Sexual activity: Not on file  Other Topics Concern   Not on file  Social History Narrative   Married, Mother of 2 adopted children. She has 3 grandchildren.   She worked as a  Psychologist, occupational at [employer].   She quit smoking in 1984, and does not drink alcohol.   Social Determinants of Health   Financial Resource Strain: Not on file  Food Insecurity: Not on file  Transportation Needs: Not on file  Physical Activity: Not on file  Stress: Not on file  Social Connections: Not on file     Family History: The patient's family history includes Colon cancer in her brother and sister; Heart disease in her mother and sister; Hypertension in her mother; Melanoma in her brother; Pancreatic cancer in her brother and father; Prostate cancer in her brother and father; Skin cancer in her father.  ROS:   Please see the history of present illness.     All other systems reviewed and are negative.  EKGs/Labs/Other Studies Reviewed:    The following studies were reviewed today:  Nuclear stress test  2016 Impression Exercise Capacity:  Lexiscan with no exercise. BP Response:  Normal blood pressure response. Clinical Symptoms:  No significant symptoms noted. ECG Impression:  No significant ST segment change suggestive of ischemia. Comparison with Prior Nuclear Study: No images to compare   Overall Impression:  Normal stress nuclear study.   LV Wall Motion:  NL LV Function; NL Wall Motion   EKG:  EKG is  ordered today.  The ekg ordered today demonstrates sinus rhythm HR 98  Recent Labs: No results found for requested labs within last 365 days.  Recent Lipid Panel    Component Value Date/Time   CHOL 122 10/23/2012 0612   TRIG 116 10/23/2012 0612   HDL 67 10/23/2012 0612   CHOLHDL 1.8 10/23/2012 0612   VLDL 23 10/23/2012 0612   LDLCALC 32 10/23/2012 0612     Risk Assessment/Calculations:                Physical Exam:    VS:  BP 110/80   Pulse 98   Ht '4\' 11"'$  (1.499 m)   Wt 145 lb 3.2 oz (65.9 kg)   SpO2 98%   BMI 29.33 kg/m  Wt Readings from Last 3 Encounters:  03/12/22 145 lb 3.2 oz (65.9 kg)  12/15/19 150 lb (68 kg)  10/05/19 148 lb  6.4 oz (67.3 kg)     GEN:  Well nourished, well developed in no acute distress HEENT: Normal NECK: No JVD; No carotid bruits LYMPHATICS: No lymphadenopathy CARDIAC: RRR, no murmurs, rubs, gallops RESPIRATORY:  Clear to auscultation without rales, wheezing or rhonchi  ABDOMEN: Soft, non-tender, non-distended MUSCULOSKELETAL:  No edema; No deformity  SKIN: Warm and dry NEUROLOGIC:  Alert and oriented x 3 PSYCHIATRIC:  Normal affect   ASSESSMENT:    1. CAD S/P PCI: OM2 PTCA 7/02, OM2 Stent 9/02, ISR 11/03 - PTCA; RCA DES 1 /14/14   2. S/P NSTEMI -- involving left circumflex coronary artery (OM2) In-stent Thrombosis   3. Essential hypertension   4. Hyperlipidemia with target LDL less than 70   5. Preoperative cardiovascular examination    PLAN:    In order of problems listed above:  CAD History as above.  Continue ASA, statin, BB Myoview 2016 reassuring   Hypertension BP controlled   Hyperlipidemia Continue statin I do not feel strongly about repeating a lipid panel given her age   Preoperative risk evaluation for MACE during the perioperative period She has a known history of CAD with prior ischemic evaluation in 2016.  She is unable to achieve 4.0 METS, although is as active as she can be.  She denies chest pain, but states she has pain all over.  Bilateral shoulder pain is often relieved with Salonpas.  I had a long discussion with the patient and her daughter.  It is unclear to me if abnormal D&C may lead to a hysterectomy.  A D&C is a low risk procedure, but in a 88 yo with hx of CAD she is at least moderate risk.  We initially discussed possible stress testing, but the patient would not be amenable to heart catheterization and I do not want to hold up procedure. Through discussion with Dr. Ellyn Hack, since she is not having concerning symptoms, she may proceed without further cardiac testing. I will send a message to the requesting office.       Shared Decision  Making/Informed Consent The risks [chest pain, shortness of breath, cardiac arrhythmias, dizziness, blood pressure fluctuations, myocardial infarction, stroke/transient ischemic attack, nausea, vomiting, allergic reaction, radiation exposure, metallic taste sensation and life-threatening complications (estimated to be 1 in 10,000)], benefits (risk stratification, diagnosing coronary artery disease, treatment guidance) and alternatives of a nuclear stress test were discussed in detail with Ms. Fate and she agrees to proceed.    Medication Adjustments/Labs and Tests Ordered: Current medicines are reviewed at length with the patient today.  Concerns regarding medicines are outlined above.  No orders of the defined types were placed in this encounter.  No orders of the defined types were placed in this encounter.   There are no Patient Instructions on file for this visit.   Signed, Ledora Bottcher, PA  03/12/2022 2:54 PM    [place]

## 2022-03-12 ENCOUNTER — Ambulatory Visit: Payer: Medicare Other | Attending: Physician Assistant | Admitting: Physician Assistant

## 2022-03-12 ENCOUNTER — Encounter: Payer: Self-pay | Admitting: Physician Assistant

## 2022-03-12 VITALS — BP 110/80 | HR 98 | Ht 59.0 in | Wt 145.2 lb

## 2022-03-12 DIAGNOSIS — Z01810 Encounter for preprocedural cardiovascular examination: Secondary | ICD-10-CM | POA: Diagnosis not present

## 2022-03-12 DIAGNOSIS — I10 Essential (primary) hypertension: Secondary | ICD-10-CM

## 2022-03-12 DIAGNOSIS — I2510 Atherosclerotic heart disease of native coronary artery without angina pectoris: Secondary | ICD-10-CM

## 2022-03-12 DIAGNOSIS — E785 Hyperlipidemia, unspecified: Secondary | ICD-10-CM

## 2022-03-12 DIAGNOSIS — I214 Non-ST elevation (NSTEMI) myocardial infarction: Secondary | ICD-10-CM | POA: Diagnosis not present

## 2022-03-12 DIAGNOSIS — Z9861 Coronary angioplasty status: Secondary | ICD-10-CM | POA: Diagnosis not present

## 2022-03-12 NOTE — Patient Instructions (Signed)
Medication Instructions:  Your physician recommends that you continue on your current medications as directed. Please refer to the Current Medication list given to you today.  *If you need a refill on your cardiac medications before your next appointment, please call your pharmacy*   Lab Work: NONE ordered at this time of appointment   If you have labs (blood work) drawn today and your tests are completely normal, you will receive your results only by: [place] (if you have MyChart) OR A paper copy in the mail If you have any lab test that is abnormal or we need to change your treatment, we will call you to review the results.   Testing/Procedures: Your physician has requested that you have a lexiscan myoview. For further information please visit [email or web address]. Please follow instruction sheet, as given.   The test will take approximately 3 to 4 hours to complete; you may bring reading material.  If someone comes with you to your appointment, they will need to remain in the main lobby due to limited space in the testing area. **If you are pregnant or breastfeeding, please notify the nuclear lab prior to your appointment**  How to prepare for your Myocardial Perfusion Test: Do not eat or drink 3 hours prior to your test, except you may have water. Do not consume products containing caffeine (regular or decaffeinated) 12 hours prior to your test. (ex: coffee, chocolate, sodas, tea). Do bring a list of your current medications with you.  If not listed below, you may take your medications as normal. Do wear comfortable clothes (no dresses or overalls) and walking shoes, tennis shoes preferred (No heels or open toe shoes are allowed). Do NOT wear cologne, perfume, aftershave, or lotions (deodorant is allowed). If these instructions are not followed, your test will have to be rescheduled.    Follow-Up: At [facility], you and your health needs are our priority.  As part of  our continuing mission to provide you with exceptional heart care, we have created designated Provider Care Teams.  These Care Teams include your primary Cardiologist (physician) and Advanced Practice Providers (APPs -  Physician Assistants and Nurse Practitioners) who all work together to provide you with the care you need, when you need it.  We recommend signing up for the patient portal called "MyChart".  Sign up information is provided on this After Visit Summary.  MyChart is used to connect with patients for Virtual Visits (Telemedicine).  Patients are able to view lab/test results, encounter notes, upcoming appointments, etc.  Non-urgent messages can be sent to your provider as well.   To learn more about what you can do with MyChart, go to [email or web address].    Your next appointment:   6 month(s)  The format for your next appointment:   In Person  Provider:   Glenetta Hew, MD

## 2022-03-13 ENCOUNTER — Telehealth: Payer: Self-pay | Admitting: Physician Assistant

## 2022-03-13 NOTE — Telephone Encounter (Signed)
Based on Dr. Allison Quarry recommendations, will cancel nuclear stress test. She may proceed with D&C.  I will addend my note and send to requesting office.

## 2022-03-13 NOTE — Telephone Encounter (Signed)
Pt daughter called back wanting to inform that she received your message and she said thank you. Pt stress test has been cancelled.

## 2022-03-13 NOTE — Telephone Encounter (Signed)
Appt cancelled

## 2022-03-19 ENCOUNTER — Encounter (HOSPITAL_COMMUNITY): Payer: Medicare Other

## 2022-03-20 ENCOUNTER — Encounter (HOSPITAL_COMMUNITY): Payer: Self-pay | Admitting: Obstetrics and Gynecology

## 2022-03-20 ENCOUNTER — Other Ambulatory Visit: Payer: Self-pay

## 2022-03-20 NOTE — Progress Notes (Addendum)
SDW CALL  Spoke with pt's POA/daughter, [name] given pre-op instructions over the phone. The opportunity was given for the [facility]  to ask questions. No further questions asked. [name] verbalized understanding of instructions given.   PCP - Lajean Manes MD Cardiologist - Dr. Ellyn Hack  PPM/ICD - denies Device Orders -  Rep Notified -   Chest x-ray - 09/15/16 EKG - 03/12/22 Stress Test - 2016 ECHO - 08/27/12 Cardiac Cath - 07/15/12  Sleep Study - denies CPAP -   Fasting Blood Sugar - na Checks Blood Sugar _____ times a day  Blood Thinner Instructions:na Aspirin Instructions: follow your surgeon's instructions for when to stop aspirin.  ERAS Protcol -clear liquids until 0630 PRE-SURGERY Ensure or G2- no  COVID TEST- na   Anesthesia review: yes-cardiac history not undergoing a cardiac prod=cedure  Patient denies shortness of breath, fever, cough and chest pain over the phone call    Surgical Instructions    Your procedure is scheduled on 03/21/22  Report to [facility] Main Entrance "A" at 7:00 A.M., then check in with the Admitting office.  Call this number if you have problems the morning of surgery:  [phone]    Remember:  Do not eat after midnight the night before your surgery  You may drink clear liquids until 6:30am the morning of your surgery.   Clear liquids allowed are: Water, Non-Citrus Juices (without pulp), Carbonated Beverages, Clear Tea, Black Coffee ONLY (NO MILK, CREAM OR POWDERED CREAMER of any kind), and Gatorade   Take these medicines the morning of surgery with A SIP OF WATER:  metoprolol tartrate (LOPRESSOR)  acetaminophen (TYLENOL) if needed fluticasone (FLONASE) if needed loratadine (CLARITIN) if needed  As of today, STOP taking any Aspirin (unless otherwise instructed by your surgeon) Aleve, Naproxen, Ibuprofen, Motrin, Advil, Goody's, BC's, all herbal medications, fish oil, and all vitamins.  [place] is not responsible for  any belongings or valuables. .   Do NOT Smoke (Tobacco/Vaping)  24 hours prior to your procedure  If you use a CPAP at night, you may bring your mask for your overnight stay.   Contacts, glasses, hearing aids, dentures or partials may not be worn into surgery, please bring cases for these belongings   Patients discharged the day of surgery will not be allowed to drive home, and someone needs to stay with them for 24 hours.   SURGICAL WAITING ROOM VISITATION You may have 1 visitor in the pre-op area at a time determined by the pre-op nurse. (Visitor may not switch out) Patients having surgery or a procedure in a hospital may have two support people in the waiting room. Children under the age of 27 must have an adult with them who is not the patient. They may stay in the waiting area during the procedure and may switch out with other visitors. If the patient needs to stay at the hospital during part of their recovery, the visitor guidelines for inpatient rooms apply.  Please refer to the [facility] website for the visitor guidelines for Inpatients (after your surgery is over and you are in a regular room).     Special instructions:    Oral Hygiene is also important to reduce your risk of infection.  Remember - BRUSH YOUR TEETH THE MORNING OF SURGERY WITH YOUR REGULAR TOOTHPASTE   Day of Surgery:  Take a shower the day of or night before with antibacterial soap. Wear Clean/Comfortable clothing the morning of surgery Do not apply any deodorants/lotions.  Do not wear jewelry or makeup Do not wear lotions, powders, perfumes/colognes, or deodorant. Do not shave 48 hours prior to surgery.  Men may shave face and neck. Do not bring valuables to the hospital. Do not wear nail polish, gel polish, artificial nails, or any other type of covering on natural nails (fingers and toes) If you have artificial nails or gel coating that need to be removed by a nail salon, please have this removed  prior to surgery. Artificial nails or gel coating may interfere with anesthesia's ability to adequately monitor your vital signs. Remember to brush your teeth WITH YOUR REGULAR TOOTHPASTE.

## 2022-03-21 ENCOUNTER — Ambulatory Visit (HOSPITAL_BASED_OUTPATIENT_CLINIC_OR_DEPARTMENT_OTHER): Payer: Medicare Other | Admitting: Anesthesiology

## 2022-03-21 ENCOUNTER — Ambulatory Visit (HOSPITAL_COMMUNITY): Payer: Medicare Other | Admitting: Anesthesiology

## 2022-03-21 ENCOUNTER — Encounter (HOSPITAL_COMMUNITY): Payer: Self-pay | Admitting: Obstetrics and Gynecology

## 2022-03-21 ENCOUNTER — Encounter (HOSPITAL_COMMUNITY): Payer: Medicare Other

## 2022-03-21 ENCOUNTER — Other Ambulatory Visit: Payer: Self-pay

## 2022-03-21 ENCOUNTER — Inpatient Hospital Stay (HOSPITAL_COMMUNITY): Admit: 2022-03-21 | Payer: Medicare Other

## 2022-03-21 ENCOUNTER — Ambulatory Visit (HOSPITAL_COMMUNITY)
Admission: RE | Admit: 2022-03-21 | Discharge: 2022-03-21 | Disposition: A | Discharge status: Home / Self Care | Payer: Medicare Other | Attending: Obstetrics and Gynecology | Admitting: Obstetrics and Gynecology

## 2022-03-21 ENCOUNTER — Encounter (HOSPITAL_COMMUNITY)
Admission: RE | Disposition: A | Discharge status: Home / Self Care | Payer: Self-pay | Source: Home / Self Care | Attending: Obstetrics and Gynecology

## 2022-03-21 DIAGNOSIS — Z955 Presence of coronary angioplasty implant and graft: Secondary | ICD-10-CM | POA: Insufficient documentation

## 2022-03-21 DIAGNOSIS — R9389 Abnormal findings on diagnostic imaging of other specified body structures: Secondary | ICD-10-CM | POA: Diagnosis not present

## 2022-03-21 DIAGNOSIS — M1990 Unspecified osteoarthritis, unspecified site: Secondary | ICD-10-CM | POA: Insufficient documentation

## 2022-03-21 DIAGNOSIS — N9971 Accidental puncture and laceration of a genitourinary system organ or structure during a genitourinary system procedure: Secondary | ICD-10-CM | POA: Diagnosis not present

## 2022-03-21 DIAGNOSIS — I340 Nonrheumatic mitral (valve) insufficiency: Secondary | ICD-10-CM | POA: Diagnosis not present

## 2022-03-21 DIAGNOSIS — N950 Postmenopausal bleeding: Secondary | ICD-10-CM | POA: Insufficient documentation

## 2022-03-21 DIAGNOSIS — I252 Old myocardial infarction: Secondary | ICD-10-CM

## 2022-03-21 DIAGNOSIS — Z87891 Personal history of nicotine dependence: Secondary | ICD-10-CM

## 2022-03-21 DIAGNOSIS — I2510 Atherosclerotic heart disease of native coronary artery without angina pectoris: Secondary | ICD-10-CM

## 2022-03-21 DIAGNOSIS — J45909 Unspecified asthma, uncomplicated: Secondary | ICD-10-CM | POA: Diagnosis not present

## 2022-03-21 DIAGNOSIS — I10 Essential (primary) hypertension: Secondary | ICD-10-CM | POA: Diagnosis not present

## 2022-03-21 HISTORY — PX: LAPAROSCOPY: SHX197

## 2022-03-21 HISTORY — PX: DILATATION & CURETTAGE/HYSTEROSCOPY WITH MYOSURE: SHX6511

## 2022-03-21 LAB — CBC
HCT: 37.1 % (ref 36.0–46.0)
Hemoglobin: 12.5 g/dL (ref 12.0–15.0)
MCH: 32.2 pg (ref 26.0–34.0)
MCHC: 33.7 g/dL (ref 30.0–36.0)
MCV: 95.6 fL (ref 80.0–100.0)
Platelets: 236 10*3/uL (ref 150–400)
RBC: 3.88 MIL/uL (ref 3.87–5.11)
RDW: 13.2 % (ref 11.5–15.5)
WBC: 6.5 10*3/uL (ref 4.0–10.5)
nRBC: 0 % (ref 0.0–0.2)

## 2022-03-21 LAB — BASIC METABOLIC PANEL
Anion gap: 10 (ref 5–15)
BUN: 19 mg/dL (ref 8–23)
CO2: 23 mmol/L (ref 22–32)
Calcium: 9.7 mg/dL (ref 8.9–10.3)
Chloride: 105 mmol/L (ref 98–111)
Creatinine, Ser: 1.03 mg/dL — ABNORMAL HIGH (ref 0.44–1.00)
GFR, Estimated: 51 mL/min — ABNORMAL LOW (ref >60)
Glucose, Bld: 142 mg/dL — ABNORMAL HIGH (ref 70–99)
Potassium: 3.5 mmol/L (ref 3.5–5.1)
Sodium: 138 mmol/L (ref 135–145)

## 2022-03-21 SURGERY — DILATATION & CURETTAGE/HYSTEROSCOPY WITH MYOSURE
Anesthesia: General | Site: Uterus

## 2022-03-21 MED ORDER — DEXMEDETOMIDINE HCL IN NACL 80 MCG/20ML IV SOLN
INTRAVENOUS | Status: AC
  Filled 2022-03-21: qty 20

## 2022-03-21 MED ORDER — CHLORHEXIDINE GLUCONATE 0.12 % MT SOLN
OROMUCOSAL | Status: AC
  Administered 2022-03-21: 15 mL via OROMUCOSAL
  Filled 2022-03-21: qty 15

## 2022-03-21 MED ORDER — ROCURONIUM BROMIDE 10 MG/ML (PF) SYRINGE
PREFILLED_SYRINGE | INTRAVENOUS | Status: AC
  Filled 2022-03-21: qty 10

## 2022-03-21 MED ORDER — DEXAMETHASONE SODIUM PHOSPHATE 4 MG/ML IJ SOLN
INTRAMUSCULAR | Status: DC | PRN
  Administered 2022-03-21: 5 mg via INTRAVENOUS

## 2022-03-21 MED ORDER — PHENYLEPHRINE HCL (PRESSORS) 10 MG/ML IV SOLN
INTRAVENOUS | Status: DC | PRN
  Administered 2022-03-21 (×13): 80 ug via INTRAVENOUS

## 2022-03-21 MED ORDER — SODIUM CHLORIDE 0.9 % IR SOLN
Status: DC | PRN
  Administered 2022-03-21: 3000 mL

## 2022-03-21 MED ORDER — PHENYLEPHRINE HCL-NACL 20-0.9 MG/250ML-% IV SOLN
INTRAVENOUS | Status: DC | PRN
  Administered 2022-03-21: 50 ug/min via INTRAVENOUS
  Administered 2022-03-21: 25 ug/min via INTRAVENOUS

## 2022-03-21 MED ORDER — BUPIVACAINE HCL (PF) 0.25 % IJ SOLN
INTRAMUSCULAR | Status: AC
  Filled 2022-03-21: qty 30

## 2022-03-21 MED ORDER — PHENYLEPHRINE 80 MCG/ML (10ML) SYRINGE FOR IV PUSH (FOR BLOOD PRESSURE SUPPORT)
PREFILLED_SYRINGE | INTRAVENOUS | Status: AC
  Filled 2022-03-21: qty 10

## 2022-03-21 MED ORDER — CHLORHEXIDINE GLUCONATE 0.12 % MT SOLN: 15.0000 mL | Freq: Once | OROMUCOSAL | Status: AC

## 2022-03-21 MED ORDER — FENTANYL CITRATE (PF) 100 MCG/2ML IJ SOLN: 25.0000 ug | INTRAMUSCULAR | Status: DC | PRN

## 2022-03-21 MED ORDER — ORAL CARE MOUTH RINSE: 15.0000 mL | Freq: Once | OROMUCOSAL | Status: AC

## 2022-03-21 MED ORDER — PROPOFOL 10 MG/ML IV BOLUS
INTRAVENOUS | Status: DC | PRN
  Administered 2022-03-21: 20 mg via INTRAVENOUS
  Administered 2022-03-21: 30 mg via INTRAVENOUS
  Administered 2022-03-21: 100 mg via INTRAVENOUS
  Administered 2022-03-21: 50 mg via INTRAVENOUS
  Administered 2022-03-21: 20 mg via INTRAVENOUS

## 2022-03-21 MED ORDER — LIDOCAINE 2% (20 MG/ML) 5 ML SYRINGE
INTRAMUSCULAR | Status: AC
  Filled 2022-03-21: qty 5

## 2022-03-21 MED ORDER — ONDANSETRON HCL 4 MG/2ML IJ SOLN
INTRAMUSCULAR | Status: AC
  Filled 2022-03-21: qty 2

## 2022-03-21 MED ORDER — DEXMEDETOMIDINE HCL IN NACL 80 MCG/20ML IV SOLN
INTRAVENOUS | Status: DC | PRN
  Administered 2022-03-21: 12 ug via BUCCAL

## 2022-03-21 MED ORDER — LACTATED RINGERS IV SOLN: INTRAVENOUS | Status: DC

## 2022-03-21 MED ORDER — ROCURONIUM BROMIDE 100 MG/10ML IV SOLN
INTRAVENOUS | Status: DC | PRN
  Administered 2022-03-21: 30 mg via INTRAVENOUS

## 2022-03-21 MED ORDER — FENTANYL CITRATE (PF) 250 MCG/5ML IJ SOLN
INTRAMUSCULAR | Status: AC
  Filled 2022-03-21: qty 5

## 2022-03-21 MED ORDER — SUCCINYLCHOLINE CHLORIDE 200 MG/10ML IV SOSY
PREFILLED_SYRINGE | INTRAVENOUS | Status: DC | PRN
  Administered 2022-03-21: 100 mg via INTRAVENOUS

## 2022-03-21 MED ORDER — FENTANYL CITRATE (PF) 100 MCG/2ML IJ SOLN
INTRAMUSCULAR | Status: DC | PRN
  Administered 2022-03-21: 25 ug via INTRAVENOUS
  Administered 2022-03-21: 50 ug via INTRAVENOUS
  Administered 2022-03-21: 25 ug via INTRAVENOUS

## 2022-03-21 MED ORDER — 0.9 % SODIUM CHLORIDE (POUR BTL) OPTIME
TOPICAL | Status: DC | PRN
  Administered 2022-03-21 (×2): 1000 mL

## 2022-03-21 MED ORDER — LIDOCAINE 2% (20 MG/ML) 5 ML SYRINGE
INTRAMUSCULAR | Status: DC | PRN
  Administered 2022-03-21: 40 mg via INTRAVENOUS

## 2022-03-21 MED ORDER — PROPOFOL 10 MG/ML IV BOLUS
INTRAVENOUS | Status: AC
  Filled 2022-03-21: qty 20

## 2022-03-21 MED ORDER — BUPIVACAINE HCL 0.25 % IJ SOLN
INTRAMUSCULAR | Status: DC | PRN
  Administered 2022-03-21: 30 mL

## 2022-03-21 MED ORDER — DEXAMETHASONE SODIUM PHOSPHATE 10 MG/ML IJ SOLN
INTRAMUSCULAR | Status: AC
  Filled 2022-03-21: qty 1

## 2022-03-21 MED ORDER — ONDANSETRON HCL 4 MG/2ML IJ SOLN
INTRAMUSCULAR | Status: DC | PRN
  Administered 2022-03-21: 4 mg via INTRAVENOUS

## 2022-03-21 MED ORDER — ACETAMINOPHEN 500 MG PO TABS
1000.0000 mg | ORAL_TABLET | Freq: Once | ORAL | Status: AC
  Administered 2022-03-21: 500 mg via ORAL
  Filled 2022-03-21: qty 2

## 2022-03-21 MED ORDER — SUGAMMADEX SODIUM 200 MG/2ML IV SOLN
INTRAVENOUS | Status: DC | PRN
  Administered 2022-03-21: 200 mg via INTRAVENOUS

## 2022-03-21 MED ORDER — ONDANSETRON HCL 4 MG/2ML IJ SOLN: 4.0000 mg | Freq: Once | INTRAMUSCULAR | Status: DC | PRN

## 2022-03-21 MED ORDER — LACTATED RINGERS IV SOLN: INTRAVENOUS | Status: DC | PRN

## 2022-03-21 MED ORDER — MIDAZOLAM HCL 2 MG/2ML IJ SOLN
INTRAMUSCULAR | Status: AC
  Filled 2022-03-21: qty 2

## 2022-03-21 SURGICAL SUPPLY — 30 items
ADH SKN CLS LQ APL DERMABOND (GAUZE/BANDAGES/DRESSINGS) ×2
CANISTER SUCT 3000ML PPV (MISCELLANEOUS) ×3 IMPLANT
CATH ROBINSON RED A/P 16FR (CATHETERS) ×3 IMPLANT
CNTNR URN SCR LID CUP LEK RST (MISCELLANEOUS) IMPLANT
CONT SPEC 4OZ STRL OR WHT (MISCELLANEOUS) ×2
DERMABOND ADVANCED .7 DNX6 (GAUZE/BANDAGES/DRESSINGS) IMPLANT
DEVICE MYOSURE LITE (MISCELLANEOUS) IMPLANT
DEVICE MYOSURE REACH (MISCELLANEOUS) IMPLANT
DILATOR CANAL MILEX (MISCELLANEOUS) IMPLANT
GLOVE BIO SURGEON STRL SZ 6.5 (GLOVE) ×3 IMPLANT
GLOVE SURG ENC TEXT LTX SZ6.5 (GLOVE) ×3 IMPLANT
GLOVE SURG UNDER POLY LF SZ7 (GLOVE) ×3 IMPLANT
GOWN STRL REUS W/ TWL LRG LVL3 (GOWN DISPOSABLE) ×6 IMPLANT
GOWN STRL REUS W/TWL LRG LVL3 (GOWN DISPOSABLE) ×4
IRRIG SUCT STRYKERFLOW 2 WTIP (MISCELLANEOUS) ×2
IRRIGATION SUCT STRKRFLW 2 WTP (MISCELLANEOUS) IMPLANT
KIT PROCEDURE FLUENT (KITS) ×3 IMPLANT
KIT TURNOVER KIT B (KITS) ×3 IMPLANT
NS IRRIG 1000ML POUR BTL (IV SOLUTION) IMPLANT
PACK LAPAROSCOPY BASIN (CUSTOM PROCEDURE TRAY) IMPLANT
PACK VAGINAL MINOR WOMEN LF (CUSTOM PROCEDURE TRAY) ×3 IMPLANT
PAD OB MATERNITY 4.3X12.25 (PERSONAL CARE ITEMS) ×3 IMPLANT
SEAL ROD LENS SCOPE MYOSURE (ABLATOR) ×3 IMPLANT
SET TUBE SMOKE EVAC HIGH FLOW (TUBING) IMPLANT
SLEEVE Z-THREAD 5X100MM (TROCAR) IMPLANT
SUT VIC AB 4-0 PS2 27 (SUTURE) IMPLANT
TOWEL GREEN STERILE FF (TOWEL DISPOSABLE) ×6 IMPLANT
TRAY FOLEY W/BAG SLVR 14FR (SET/KITS/TRAYS/PACK) IMPLANT
TROCAR Z-THREAD OPTICAL 5X100M (TROCAR) IMPLANT
UNDERPAD 30X36 HEAVY ABSORB (UNDERPADS AND DIAPERS) ×3 IMPLANT

## 2022-03-21 NOTE — Anesthesia Procedure Notes (Signed)
Procedure Name: LMA Insertion Date/Time: 03/21/2022 9:38 AM  Performed by: Michele Rockers, CRNAPre-anesthesia Checklist: Patient identified, Patient being monitored, Timeout performed, Emergency Drugs available and Suction available Patient Re-evaluated:Patient Re-evaluated prior to induction Oxygen Delivery Method: Circle system utilized Preoxygenation: Pre-oxygenation with 100% oxygen Induction Type: IV induction Ventilation: Mask ventilation without difficulty LMA: LMA inserted LMA Size: 4.0 Placement Confirmation: positive ETCO2 and breath sounds checked- equal and bilateral Tube secured with: Tape Dental Injury: Teeth and Oropharynx as per pre-operative assessment

## 2022-03-21 NOTE — Anesthesia Procedure Notes (Signed)
Procedure Name: Intubation Date/Time: 03/21/2022 10:29 AM  Performed by: Michele Rockers, CRNAPre-anesthesia Checklist: Patient identified, Patient being monitored, Timeout performed, Emergency Drugs available and Suction available Patient Re-evaluated:Patient Re-evaluated prior to induction Oxygen Delivery Method: Circle system utilized Preoxygenation: Pre-oxygenation with 100% oxygen Induction Type: IV induction Ventilation: Mask ventilation without difficulty Laryngoscope Size: Miller and 2 Grade View: Grade I Tube type: Oral Tube size: 7.0 mm Number of attempts: 1 Airway Equipment and Method: Stylet Placement Confirmation: ETT inserted through vocal cords under direct vision, positive ETCO2 and breath sounds checked- equal and bilateral Secured at: 21 cm Tube secured with: Tape Dental Injury: Teeth and Oropharynx as per pre-operative assessment

## 2022-03-21 NOTE — Op Note (Signed)
Pre Op Dx:   1. Postmenopausal bleeding 2. Thickened endometrium on ultrasound (2.14cm)  Post Op Dx:   1. Postmenopausal bleeding 2. Thickened endometrium on ultrasound (2.14cm) 3. Uterine perforation  Procedure:   1. Pap smear 2. Hysteroscopy with Dilation and Curettage 3. Diagnostic Laparoscopy   Surgeon:  Dr. Drema Dallas Assistants:  None Anesthesia:  General   EBL:  30cc  IVF:  See anesthesia documentation UOP:  25cc Fluid Deficit:  620cc   Drains:  Foley catheter Specimen removed:  Endometrial curettings and pap smear -- collected to pathology. Device(s) implanted: None Case Type:  Clean-contaminated Findings:  Normal-appearing atrophic cervix. Uterus sounded to 6cm at start of the case. Proliferative-appearing endometrium but difficulty visualizing the inner endometrial cavity. There was what appeared to be a proliferative-appearing ~3cm mass. Bilateral tubal ostia were not visualized. Omental adhesions on the right side of the abdomen was noted. Uterus appeared small and retroverted. Unable to see bilateral tubes in their entirety and unable to visualize the ovaries due to inability to fully manipulate the uterus. Fluid was noted within the pelvis with minimal bleeding. Uterine perforation visualized at the fundus. Small bowel and colon visualized and assessed without any evidence of bowel injury. Complications: None Indications:  88 y.o. postmenopausal G0 with postmenopausal bleeding and thickened endometrium on ultrasound (2.14cm). Uterine perforation was noted at the time of surgery and therefore diagnostic laparoscopy was performed after consent obtained from patient's daughter, Rise Paganini.   Description of each procedure:  After informed consent was obtained the patient was taken to the operating room in the dorsal supine position.  After administration of general anesthesia, the patient was placed in the dorsal lithotomy position and prepped and draped in the usual sterile  fashion.  Her bladder was emptied using an in and out catheter.  A pre-operative time-out was completed.  Pap smear, using the broom and cytobrush, was collected. The anterior lip of the cervix was grasped with a single-tooth tenaculum and the cervix was serially dilated to accommodate the hysteroscope.  The hysteroscope was advanced and the findings as above was noted.  A sharp curette was used to gently curettage the endometrium. The diagnostic 3.2m hysteroscope was used and there was no visible uterine perforation. The 5.54mhysteroscope was used and a portion of the suspected mass was resected using the Myosure Reach (minimally). There was a significant increase in fluid deficit noted and uterine perforation was suspected. The uterus sounded significantly beyond 6cm. The single-tooth tenaculum was removed and its sites were hemostatic.  Adequate hemostasis was noted.    Decision was made to proceed with diagnostic laparoscopy. LMA converted to endotracheal tube. She was prepped and draped in the usual sterile fashion. Foley catheter was placed and spongestick placed in the vagina. An operative timeout was performed again. The abdomen was entered through a Palmer's point incision using a 61m46mrocar under direct visualization and a pneumoperitoneum was established. The findings as above were noted. Two left lower quadrant 61mm60mrts were placed under direct visualization. The entry point was visualized from the inferior port and atraumatic entry confirmed. The fluid within the pelvis was suctioned. The fundal uterine perforation was noted with minimal oozing. The small bowel and colon were inspected without any evidence of injury. The fundal perforation was cauterized using electrosurgery. Adequate hemostasis was noted. Pneumoperitoneum was reduced and the ports were withdrawn. The skin was closed with 4-0 Vicryl in subcuticular fashion and skin glue placed atop. The spongestick was removed. The patient was  returned to dorsal supine position, awakened and extubated in the OR having appeared to tolerate the procedure well. All sponge, needle, and instrument counts were correct x 2 at the end of the case.  Disposition:  PACU  Drema Dallas, DO

## 2022-03-21 NOTE — Transfer of Care (Signed)
Immediate Anesthesia Transfer of Care Note  Patient: [name]  Procedure(s) Performed: DILATATION & CURETTAGE/HYSTEROSCOPY WITH MYOSURE, PAP SMEAR (Uterus) DIAGNOSTIC LAPAROSCOPY (Abdomen)  Patient Location: PACU  Anesthesia Type:General  Level of Consciousness: sedated, patient cooperative and responds to stimulation  Airway & Oxygen Therapy: Patient Spontanous Breathing and Patient connected to nasal cannula oxygen  Post-op Assessment: Report given to RN, Post -op Vital signs reviewed and stable and Patient moving all extremities X 4  Post vital signs: Reviewed and stable  Last Vitals:  Vitals Value Taken Time  BP 137/70 03/21/22 1150  Temp 36.4 C 03/21/22 1150  Pulse 77 03/21/22 1152  Resp 10 03/21/22 1152  SpO2 98 % 03/21/22 1152  Vitals shown include unvalidated device data.  Last Pain:  Vitals:   03/21/22 1150  TempSrc:   PainSc: Asleep         Complications: No notable events documented.

## 2022-03-21 NOTE — Anesthesia Postprocedure Evaluation (Signed)
Anesthesia Post Note  Patient: [name]  Procedure(s) Performed: DILATATION & CURETTAGE/HYSTEROSCOPY WITH MYOSURE, PAP SMEAR (Uterus) DIAGNOSTIC LAPAROSCOPY (Abdomen)     Patient location during evaluation: PACU Anesthesia Type: General Level of consciousness: awake and alert, oriented and patient cooperative Pain management: pain level controlled Vital Signs Assessment: post-procedure vital signs reviewed and stable Respiratory status: spontaneous breathing, nonlabored ventilation and respiratory function stable Cardiovascular status: blood pressure returned to baseline and stable Postop Assessment: no apparent nausea or vomiting Anesthetic complications: no   No notable events documented.  Last Vitals:  Vitals:   03/21/22 1215 03/21/22 1224  BP: 137/63 (!) 144/61  Pulse: 83 84  Resp: 17 13  Temp:  (!) 36.4 C  SpO2: 96% 96%    Last Pain:  Vitals:   03/21/22 1215  TempSrc:   PainSc: 0-No pain                 Pervis Hocking

## 2022-03-21 NOTE — Anesthesia Preprocedure Evaluation (Addendum)
Anesthesia Evaluation  Patient identified by MRN, date of birth, ID band Patient awake    Reviewed: Allergy & Precautions, NPO status , Patient's Chart, lab work & pertinent test results, reviewed documented beta blocker date and time   History of Anesthesia Complications Negative for: history of anesthetic complications  Airway Mallampati: III  TM Distance: >3 FB Neck ROM: Full    Dental  (+) Teeth Intact, Dental Advisory Given   Pulmonary asthma (uses albuterol at least once daily) , former smoker,  Quit smoking 1994   Pulmonary exam normal breath sounds clear to auscultation       Cardiovascular hypertension, Pt. on medications and Pt. on home beta blockers + CAD, + Past MI and + Cardiac Stents  Normal cardiovascular exam+ Valvular Problems/Murmurs (mild MR, mild-mod TR) MR  Rhythm:Regular Rate:Normal  a) 7/'02: STEMI, 1st Cutting PTCA- OM2 -->restenosed 9/'02 NSTEMI-->3.0 mm x 12 mm BMS-OM 2;  b) 11/'03 DOE w/ + Cardiolite - Cutter PTCA for ISR -- patent in 2004 (after False + Cardiolite);  c) 07/2012 - Unstable Angina (dicomfort across the upper chest, feels like 'sizzlng") -- PCI to proximal RCA with Promus Premier DES 2.75 mm x 20 mm (3.0 mm), patent OM2 stent ~10% ISR (HARDING) - at that time was cared for by Dr. Rollene Fare  d) 2D Echo 08/2012: EF 55-60%. GR 1 DD. Mild MR  e) Myoview 08/2014: Normal LV size and function. Normal wall motion. LOW RISK.    Neuro/Psych negative neurological ROS  negative psych ROS   GI/Hepatic Neg liver ROS, Hx CRC   Endo/Other  negative endocrine ROS  Renal/GU negative Renal ROS  negative genitourinary   Musculoskeletal  (+) Arthritis ,   Abdominal   Peds  Hematology negative hematology ROS (+)   Anesthesia Other Findings   Reproductive/Obstetrics negative OB ROS                            Anesthesia Physical Anesthesia Plan  ASA: 3  Anesthesia  Plan: General   Post-op Pain Management: Tylenol PO (pre-op)*   Induction: Intravenous  PONV Risk Score and Plan: 3 and Ondansetron and Treatment may vary due to age or medical condition  Airway Management Planned: LMA  Additional Equipment: None  Intra-op Plan:   Post-operative Plan: Extubation in OR  Informed Consent: I have reviewed the patients History and Physical, chart, labs and discussed the procedure including the risks, benefits and alternatives for the proposed anesthesia with the patient or authorized representative who has indicated his/her understanding and acceptance.     Dental advisory given and Consent reviewed with POA  Plan Discussed with: CRNA  Anesthesia Plan Comments:        Anesthesia Quick Evaluation

## 2022-03-21 NOTE — Interval H&P Note (Signed)
History and Physical Interval Note:  03/21/2022 9:19 AM  [name]  has presented today for surgery, with the diagnosis of Postmenopausal Bleeding.  The various methods of treatment have been discussed with the patient and family. After consideration of risks, benefits and other options for treatment, the patient has consented to  Procedure(s): DILATATION & CURETTAGE/HYSTEROSCOPY WITH MYOSURE, PAP SMEAR (N/A) as a surgical intervention.  Patient has been cleared by Cardiology for this procedure. The patient's history has been reviewed, patient examined, no change in status, stable for surgery.  I have reviewed the patient's chart and labs.  Questions were answered to the patient's satisfaction.     Drema Dallas

## 2022-03-22 ENCOUNTER — Encounter (HOSPITAL_COMMUNITY): Payer: Self-pay | Admitting: Obstetrics and Gynecology

## 2022-03-22 LAB — SURGICAL PATHOLOGY

## 2022-03-25 LAB — TYPE AND SCREEN
ABO/RH(D): O NEG
Antibody Screen: POSITIVE
Donor AG Type: NEGATIVE
Donor AG Type: NEGATIVE
Unit division: 0
Unit division: 0

## 2022-03-25 LAB — BPAM RBC
Blood Product Expiration Date: 202310182359
Blood Product Expiration Date: 202310192359
Unit Type and Rh: 9500
Unit Type and Rh: 9500

## 2022-04-02 LAB — CYTOLOGY - PAP
Diagnosis: NEGATIVE
Diagnosis: REACTIVE

## 2022-07-27 ENCOUNTER — Inpatient Hospital Stay (HOSPITAL_COMMUNITY)
Admission: EM | Admit: 2022-07-27 | Discharge: 2022-07-29 | DRG: 309 | Disposition: A | Discharge status: Home / Self Care | Payer: Medicare Other | Attending: Family Medicine | Admitting: Family Medicine

## 2022-07-27 ENCOUNTER — Emergency Department (HOSPITAL_COMMUNITY): Payer: Medicare Other

## 2022-07-27 ENCOUNTER — Emergency Department (HOSPITAL_COMMUNITY)
Admission: EM | Admit: 2022-07-27 | Discharge: 2022-07-27 | Discharge status: Against Medical Advice | Payer: Medicare Other | Source: Home / Self Care

## 2022-07-27 DIAGNOSIS — R011 Cardiac murmur, unspecified: Secondary | ICD-10-CM | POA: Diagnosis present

## 2022-07-27 DIAGNOSIS — Z7982 Long term (current) use of aspirin: Secondary | ICD-10-CM

## 2022-07-27 DIAGNOSIS — Z800 Family history of malignant neoplasm of digestive organs: Secondary | ICD-10-CM

## 2022-07-27 DIAGNOSIS — J9811 Atelectasis: Secondary | ICD-10-CM | POA: Diagnosis not present

## 2022-07-27 DIAGNOSIS — Z85038 Personal history of other malignant neoplasm of large intestine: Secondary | ICD-10-CM | POA: Diagnosis not present

## 2022-07-27 DIAGNOSIS — Z885 Allergy status to narcotic agent status: Secondary | ICD-10-CM

## 2022-07-27 DIAGNOSIS — R0602 Shortness of breath: Secondary | ICD-10-CM | POA: Insufficient documentation

## 2022-07-27 DIAGNOSIS — I5032 Chronic diastolic (congestive) heart failure: Secondary | ICD-10-CM | POA: Diagnosis present

## 2022-07-27 DIAGNOSIS — Z79899 Other long term (current) drug therapy: Secondary | ICD-10-CM

## 2022-07-27 DIAGNOSIS — I2489 Other forms of acute ischemic heart disease: Secondary | ICD-10-CM | POA: Diagnosis present

## 2022-07-27 DIAGNOSIS — Z5321 Procedure and treatment not carried out due to patient leaving prior to being seen by health care provider: Secondary | ICD-10-CM | POA: Insufficient documentation

## 2022-07-27 DIAGNOSIS — I4891 Unspecified atrial fibrillation: Principal | ICD-10-CM | POA: Diagnosis present

## 2022-07-27 DIAGNOSIS — M810 Age-related osteoporosis without current pathological fracture: Secondary | ICD-10-CM | POA: Diagnosis not present

## 2022-07-27 DIAGNOSIS — I700 Atherosclerosis of aorta: Secondary | ICD-10-CM | POA: Diagnosis present

## 2022-07-27 DIAGNOSIS — R0609 Other forms of dyspnea: Secondary | ICD-10-CM | POA: Diagnosis present

## 2022-07-27 DIAGNOSIS — H9190 Unspecified hearing loss, unspecified ear: Secondary | ICD-10-CM | POA: Diagnosis not present

## 2022-07-27 DIAGNOSIS — N1832 Chronic kidney disease, stage 3b: Secondary | ICD-10-CM | POA: Diagnosis present

## 2022-07-27 DIAGNOSIS — I080 Rheumatic disorders of both mitral and aortic valves: Secondary | ICD-10-CM | POA: Diagnosis present

## 2022-07-27 DIAGNOSIS — E785 Hyperlipidemia, unspecified: Secondary | ICD-10-CM | POA: Diagnosis present

## 2022-07-27 DIAGNOSIS — Z8249 Family history of ischemic heart disease and other diseases of the circulatory system: Secondary | ICD-10-CM

## 2022-07-27 DIAGNOSIS — Z1152 Encounter for screening for COVID-19: Secondary | ICD-10-CM

## 2022-07-27 DIAGNOSIS — R000 Tachycardia, unspecified: Secondary | ICD-10-CM | POA: Diagnosis not present

## 2022-07-27 DIAGNOSIS — Z96643 Presence of artificial hip joint, bilateral: Secondary | ICD-10-CM | POA: Diagnosis not present

## 2022-07-27 DIAGNOSIS — Z87891 Personal history of nicotine dependence: Secondary | ICD-10-CM

## 2022-07-27 DIAGNOSIS — I252 Old myocardial infarction: Secondary | ICD-10-CM

## 2022-07-27 DIAGNOSIS — M19012 Primary osteoarthritis, left shoulder: Secondary | ICD-10-CM | POA: Diagnosis present

## 2022-07-27 DIAGNOSIS — Z882 Allergy status to sulfonamides status: Secondary | ICD-10-CM

## 2022-07-27 DIAGNOSIS — I4819 Other persistent atrial fibrillation: Secondary | ICD-10-CM | POA: Diagnosis not present

## 2022-07-27 DIAGNOSIS — I130 Hypertensive heart and chronic kidney disease with heart failure and stage 1 through stage 4 chronic kidney disease, or unspecified chronic kidney disease: Secondary | ICD-10-CM | POA: Diagnosis not present

## 2022-07-27 DIAGNOSIS — Z9049 Acquired absence of other specified parts of digestive tract: Secondary | ICD-10-CM

## 2022-07-27 DIAGNOSIS — R9431 Abnormal electrocardiogram [ECG] [EKG]: Secondary | ICD-10-CM | POA: Diagnosis not present

## 2022-07-27 DIAGNOSIS — J45909 Unspecified asthma, uncomplicated: Secondary | ICD-10-CM | POA: Diagnosis present

## 2022-07-27 DIAGNOSIS — D631 Anemia in chronic kidney disease: Secondary | ICD-10-CM | POA: Diagnosis not present

## 2022-07-27 DIAGNOSIS — Z955 Presence of coronary angioplasty implant and graft: Secondary | ICD-10-CM

## 2022-07-27 DIAGNOSIS — G8929 Other chronic pain: Secondary | ICD-10-CM | POA: Diagnosis present

## 2022-07-27 DIAGNOSIS — Z974 Presence of external hearing-aid: Secondary | ICD-10-CM

## 2022-07-27 DIAGNOSIS — E872 Acidosis, unspecified: Secondary | ICD-10-CM | POA: Diagnosis present

## 2022-07-27 DIAGNOSIS — M19011 Primary osteoarthritis, right shoulder: Secondary | ICD-10-CM | POA: Diagnosis present

## 2022-07-27 DIAGNOSIS — I2510 Atherosclerotic heart disease of native coronary artery without angina pectoris: Secondary | ICD-10-CM | POA: Diagnosis not present

## 2022-07-27 DIAGNOSIS — Z96652 Presence of left artificial knee joint: Secondary | ICD-10-CM | POA: Diagnosis present

## 2022-07-27 DIAGNOSIS — I10 Essential (primary) hypertension: Secondary | ICD-10-CM | POA: Diagnosis not present

## 2022-07-27 DIAGNOSIS — I4719 Other supraventricular tachycardia: Secondary | ICD-10-CM | POA: Diagnosis present

## 2022-07-27 DIAGNOSIS — Z888 Allergy status to other drugs, medicaments and biological substances status: Secondary | ICD-10-CM

## 2022-07-27 DIAGNOSIS — Z86010 Personal history of colonic polyps: Secondary | ICD-10-CM

## 2022-07-27 LAB — BASIC METABOLIC PANEL
Anion gap: 13 (ref 5–15)
BUN: 17 mg/dL (ref 8–23)
CO2: 21 mmol/L — ABNORMAL LOW (ref 22–32)
Calcium: 10.2 mg/dL (ref 8.9–10.3)
Chloride: 102 mmol/L (ref 98–111)
Creatinine, Ser: 1.08 mg/dL — ABNORMAL HIGH (ref 0.44–1.00)
GFR, Estimated: 48 mL/min — ABNORMAL LOW (ref >60)
Glucose, Bld: 129 mg/dL — ABNORMAL HIGH (ref 70–99)
Potassium: 3.9 mmol/L (ref 3.5–5.1)
Sodium: 136 mmol/L (ref 135–145)

## 2022-07-27 LAB — CBC
HCT: 35.5 % — ABNORMAL LOW (ref 36.0–46.0)
Hemoglobin: 12.1 g/dL (ref 12.0–15.0)
MCH: 31.8 pg (ref 26.0–34.0)
MCHC: 34.1 g/dL (ref 30.0–36.0)
MCV: 93.4 fL (ref 80.0–100.0)
Platelets: 288 10*3/uL (ref 150–400)
RBC: 3.8 MIL/uL — ABNORMAL LOW (ref 3.87–5.11)
RDW: 13.5 % (ref 11.5–15.5)
WBC: 7.9 10*3/uL (ref 4.0–10.5)
nRBC: 0 % (ref 0.0–0.2)

## 2022-07-27 LAB — RESP PANEL BY RT-PCR (RSV, FLU A&B, COVID)  RVPGX2
Influenza A by PCR: NEGATIVE
Influenza B by PCR: NEGATIVE
Resp Syncytial Virus by PCR: NEGATIVE
SARS Coronavirus 2 by RT PCR: NEGATIVE

## 2022-07-27 LAB — MAGNESIUM: Magnesium: 2 mg/dL (ref 1.7–2.4)

## 2022-07-27 LAB — TROPONIN I (HIGH SENSITIVITY)
Troponin I (High Sensitivity): 80 ng/L — ABNORMAL HIGH (ref <18)
Troponin I (High Sensitivity): 79 ng/L — ABNORMAL HIGH (ref <18)

## 2022-07-27 LAB — TSH: TSH: 1.806 u[IU]/mL (ref 0.350–4.500)

## 2022-07-27 MED ORDER — METOPROLOL TARTRATE 25 MG PO TABS
12.5000 mg | ORAL_TABLET | Freq: Two times a day (BID) | ORAL | Status: DC
  Administered 2022-07-27 – 2022-07-28 (×2): 12.5 mg via ORAL
  Filled 2022-07-27 (×2): qty 1

## 2022-07-27 MED ORDER — PROCHLORPERAZINE EDISYLATE 10 MG/2ML IJ SOLN: 5.0000 mg | Freq: Four times a day (QID) | INTRAMUSCULAR | Status: DC | PRN

## 2022-07-27 MED ORDER — ASPIRIN 81 MG PO TBEC: 81.0000 mg | DELAYED_RELEASE_TABLET | ORAL | Status: DC

## 2022-07-27 MED ORDER — HEPARIN BOLUS VIA INFUSION
3000.0000 [IU] | Freq: Once | INTRAVENOUS | Status: AC
  Administered 2022-07-27: 3000 [IU] via INTRAVENOUS
  Filled 2022-07-27: qty 3000

## 2022-07-27 MED ORDER — MELATONIN 3 MG PO TABS: 3.0000 mg | ORAL_TABLET | Freq: Every evening | ORAL | Status: DC | PRN

## 2022-07-27 MED ORDER — ACETAMINOPHEN 325 MG PO TABS
650.0000 mg | ORAL_TABLET | Freq: Four times a day (QID) | ORAL | Status: DC | PRN
  Administered 2022-07-28 – 2022-07-29 (×2): 650 mg via ORAL
  Filled 2022-07-27 (×2): qty 2

## 2022-07-27 MED ORDER — ACETAMINOPHEN 500 MG PO TABS
1000.0000 mg | ORAL_TABLET | Freq: Once | ORAL | Status: AC
  Administered 2022-07-27: 1000 mg via ORAL
  Filled 2022-07-27: qty 2

## 2022-07-27 MED ORDER — DILTIAZEM LOAD VIA INFUSION
20.0000 mg | Freq: Once | INTRAVENOUS | Status: AC
  Administered 2022-07-27: 20 mg via INTRAVENOUS
  Filled 2022-07-27: qty 20

## 2022-07-27 MED ORDER — HEPARIN (PORCINE) 25000 UT/250ML-% IV SOLN
850.0000 [IU]/h | INTRAVENOUS | Status: DC
  Administered 2022-07-27: 850 [IU]/h via INTRAVENOUS
  Filled 2022-07-27: qty 250

## 2022-07-27 MED ORDER — DILTIAZEM HCL-DEXTROSE 125-5 MG/125ML-% IV SOLN (PREMIX)
5.0000 mg/h | INTRAVENOUS | Status: DC
  Administered 2022-07-27: 5 mg/h via INTRAVENOUS
  Filled 2022-07-27: qty 125

## 2022-07-27 NOTE — ED Provider Triage Note (Signed)
Emergency Medicine Provider Triage Evaluation Note  [name] , a 89 y.o. female  was evaluated in triage.  Pt complains of shortness of breath.  Patient reports shortness of breath worsening over the past few weeks.  Reports some chest pain states she is unaware of whether or not it is radiating pain from her shoulders her foot is new.  Was seen at [facility] found to be in atrial fibrillation and told to come the emergency department for further evaluation.  Denies history of atrial fibrillation and is not currently on anticoagulation.  Denies cough, congestion, fever..  Review of Systems  Positive: See above Negative:   Physical Exam  BP (!) 156/79 (BP Location: Right Arm)   Pulse (!) 116   Temp 97.8 F (36.6 C) (Oral)   Resp 18   SpO2 98%  Gen:   Awake, no distress   Resp:  Normal effort  MSK:   Moves extremities without difficulty  Other:  Irregularly irregular rhythm  Medical Decision Making  Medically screening exam initiated at 5:35 PM.  Appropriate orders placed.  [name] was informed that the remainder of the evaluation will be completed by another provider, this initial triage assessment does not replace that evaluation, and the importance of remaining in the ED until their evaluation is complete.  Chest x-ray available in imaging from earlier today   Wilnette Kales, [place] 07/27/22 1747

## 2022-07-27 NOTE — Progress Notes (Signed)
ANTICOAGULATION CONSULT NOTE - Initial Consult  Pharmacy Consult for Heparin infusion Indication: atrial fibrillation  Allergies  Allergen Reactions   Atorvastatin Shortness Of Breath   Brilinta [Ticagrelor] Shortness Of Breath    BLEEDING   Clopidogrel Bisulfate Shortness Of Breath    BRUISING   Codeine Nausea And Vomiting   Sulfonamide Derivatives Other (See Comments)    Intestinal upset    Patient Measurements: Height: '4\' 11"'$  (149.9 cm) Weight: 66.2 kg (146 lb) IBW/kg (Calculated) : 43.2 Heparin Dosing Weight: 57.7 kg  Vital Signs: Temp: 97.8 F (36.6 C) (01/26 1655) Temp Source: Oral (01/26 1655) BP: 159/88 (01/26 2045) Pulse Rate: 106 (01/26 2045)  Labs: Recent Labs    07/27/22 1739  HGB 12.1  HCT 35.5*  PLT 288  CREATININE 1.08*  TROPONINIHS 80*    Estimated Creatinine Clearance: 27.5 mL/min (A) (by C-G formula based on SCr of 1.08 mg/dL (H)).   Medical History: Past Medical History:  Diagnosis Date   Adenomatous colon polyp MAY 2013   HOSPITALIZED AT [facility] WITH HGB 7 -TRANSFUSED-MASS FOUND IN ASCENDING COLON   Aortic sclerosis    With murmur   Asthma    CAD S/P percutaneous coronary angioplasty 2002, 2003,2014   a) Unstable Angina 7/'02: 1st Cutting PTCA- OM2 --> restenosed 9/'02 NSTEMI--> 3.0 mm x 12 mm BMS-OM 2; b) 11/'03 DOE w/ + Cardiolite - Cutter PTCA for ISR -- patent in 2004 (after False + Cardiolite); c) 07/2012 - Unstable Angina -- PCI to proximal RCA with Promus Premier DES 2.75 mm x 20 mm (3.0 mm), patent OM2 stent ~10% ISR (HARDING)   Complication of anesthesia 2009   HIP REPLACEMENT-PT HAD HARD TIME WAKING UP--FELT LIKE SHE COULDN'T BREATHE   Coronary stent restenosis due to scar tissue 05/2002   Cutting Balloon PTCA to OM2 BMS   Diverticulosis 10/2012   Seen on Colonoscopy   DJD (degenerative joint disease), thoracolumbar    Back, Hips & Knees   Essential hypertension    H/O Colon cancer 10/2011   GI - Drs. Carlean Purl - colonoscopy  10/2012: diverticulosis, stable ileocolic anastomosis of R colon   Hyperlipidemia with target LDL less than 70    Internal hemorrhoids    Iron deficiency anemia due to chronic blood loss 10/2011   Blood Transfusion (EGD 09/2012 - unremarkable)   Medication intolerance - Plavix, Effient, Brilinta    Easy bruising, GI bleeds (led to Dx of Colon CA), Brilinta - dyspnea   Multiple lung nodules on CT 08/2012   scattered, bilateral but right>left.    Non-Q wave ST elevation myocardial infarction (STEMI) involving left circumflex coronary artery 03/2001   a) Severe thrombic ISR of prior PTCA site in OM2 --> BMS PCI; b) Echo 08/2012: Normal LV size & function.  EF 55-60%, no regional WMA, Gr 1 DD, mild MR, mild-mod TR - NO Pulmonary HTN   Osteoarthritis (arthritis due to wear and tear of joints)    Bilateral Hip Arthroplasty, L Knee TKA   Osteoporosis     Medications:  (Not in a hospital admission)   Assessment: 89 yo F presenting from [facility] physicians clinic after eval for SOB and found to be in Afib w/ RVR. Pt is not taking any oral anticoagulation prior to admission. Diltiazem infusion started for rate control of Afib in the ED. Pharmacy consulted to dose heparin infusion for Afib.   Hgb 12.1, Plt 288 No s/sx of bleeding  Goal of Therapy:  Heparin level 0.3-0.7 units/ml Monitor platelets by  anticoagulation protocol: Yes   Plan:  Give heparin 3000 units IV bolus x1 from infusion, then initiate heparin infusion at 850 units/hr Check heparin level in 8 hours Monitor daily CBC, heparin level, and for s/sx of bleeding F/u plan for transition to oral anticoagulation  Luisa Hart, PharmD, BCPS Clinical Pharmacist 07/27/2022 9:30 PM   Please refer to [facility] for pharmacy phone number

## 2022-07-27 NOTE — H&P (Addendum)
History and Physical  [name] IRS:[id number] DOB: 11-19-1927 DOA: 07/27/2022  Referring physician: Dr. Gilford Raid, EDP PCP: [place], [place]  Outpatient Specialists: Gynecology, cardiology, medical oncology. Patient coming from: Home through [facility] urgent care.  Chief Complaint: Shortness of breath and new onset A-fib with RVR.  HPI: [name] is a 89 y.o. female with medical history significant for colon cancer, arthritis, anemia of chronic disease, coronary artery disease status post PCI with stent placement on aspirin, hyperlipidemia, hypertension, chronic diastolic CHF, mild mitral regurgitation, who initially presented to [facility] urgent care with complaints of shortness of breath.  While at urgent care she was noted to be in A-fib with RVR.  This is new onset.  Calculated CHA2DS2-VASc score is 5.  Denies chest pain or cough.  EMS was activated.  She was advised to go to the ED for further evaluation and management.  In the ED, she received Cardizem bolus and was started on Cardizem drip.  She was also started on heparin drip for primary CVA prevention.  TRH, hospitalist service, was asked to admit for new onset atrial fibrillation with RVR.  ED Course: Tmax 98.5.  BP 134/85, pulse 85, respiratory rate 18, O2 saturation 99% on room air.  Lab studies significant for serum bicarb 21, troponin 80, 79.  Review of Systems: Review of systems as noted in the HPI. All other systems reviewed and are negative.   Past Medical History:  Diagnosis Date   Adenomatous colon polyp MAY 2013   HOSPITALIZED AT [facility] WITH HGB 7 -TRANSFUSED-MASS FOUND IN ASCENDING COLON   Aortic sclerosis    With murmur   Asthma    CAD S/P percutaneous coronary angioplasty 2002, 2003,2014   a) Unstable Angina 7/'02: 1st Cutting PTCA- OM2 --> restenosed 9/'02 NSTEMI--> 3.0 mm x 12 mm BMS-OM 2; b) 11/'03 DOE w/ + Cardiolite - Cutter PTCA for ISR -- patent in 2004 (after False + Cardiolite); c)  07/2012 - Unstable Angina -- PCI to proximal RCA with Promus Premier DES 2.75 mm x 20 mm (3.0 mm), patent OM2 stent ~10% ISR (HARDING)   Complication of anesthesia 2009   HIP REPLACEMENT-PT HAD HARD TIME WAKING UP--FELT LIKE SHE COULDN'T BREATHE   Coronary stent restenosis due to scar tissue 05/2002   Cutting Balloon PTCA to OM2 BMS   Diverticulosis 10/2012   Seen on Colonoscopy   DJD (degenerative joint disease), thoracolumbar    Back, Hips & Knees   Essential hypertension    H/O Colon cancer 10/2011   GI - Drs. Carlean Purl - colonoscopy 10/2012: diverticulosis, stable ileocolic anastomosis of R colon   Hyperlipidemia with target LDL less than 70    Internal hemorrhoids    Iron deficiency anemia due to chronic blood loss 10/2011   Blood Transfusion (EGD 09/2012 - unremarkable)   Medication intolerance - Plavix, Effient, Brilinta    Easy bruising, GI bleeds (led to Dx of Colon CA), Brilinta - dyspnea   Multiple lung nodules on CT 08/2012   scattered, bilateral but right>left.    Non-Q wave ST elevation myocardial infarction (STEMI) involving left circumflex coronary artery 03/2001   a) Severe thrombic ISR of prior PTCA site in OM2 --> BMS PCI; b) Echo 08/2012: Normal LV size & function.  EF 55-60%, no regional WMA, Gr 1 DD, mild MR, mild-mod TR - NO Pulmonary HTN   Osteoarthritis (arthritis due to wear and tear of joints)    Bilateral Hip Arthroplasty, L Knee TKA   Osteoporosis  Past Surgical History:  Procedure Laterality Date   APPENDECTOMY     CHOLECYSTECTOMY     COLONOSCOPY  11/16/2011   Procedure: COLONOSCOPY;  Surgeon: Gatha Mayer, MD;  Location: [place];  Service: Endoscopy;  Laterality: N/A;   COLONOSCOPY  10/2012   Carlean Purl: diverticulosis, stable R colon ileocolic anastomosis   COLONOSCOPY W/ BIOPSIES AND POLYPECTOMY  06/15/2008   adenomatous polyps, diverticulosis, internal hemorrhoids   COLONOSCOPY WITH PROPOFOL N/A 07/18/2015   Procedure: COLONOSCOPY WITH PROPOFOL;   Surgeon: Gatha Mayer, MD;  Location: [facility];  Service: Endoscopy;  Laterality: N/A;   CORONARY ANGIOPLASTY WITH STENT PLACEMENT  2002; 2003; 07/2012   a)  7/'02: 1st Cutting PTCA- OM2 --> restenosed 9/'02 --> 3.0 mm x 12 mm BMS-OM 2; b) 11/'03 - Cutter PTCA for ISR -- patent in 2004;    [place] N/A 03/21/2022   Procedure: DILATATION & CURETTAGE/HYSTEROSCOPY WITH MYOSURE, PAP SMEAR;  Surgeon: Drema Dallas, DO;  Location: [place];  Service: Gynecology;  Laterality: N/A;   ESOPHAGOGASTRODUODENOSCOPY N/A 10/23/2012   Procedure: ESOPHAGOGASTRODUODENOSCOPY (EGD);  Surgeon: Inda Castle, MD;  Location: [place];  Service: Endoscopy;  Laterality: N/A;   INCISIONAL HERNIA REPAIR N/A 01/29/2013   Procedure: LAPAROSCOPIC INCISIONAL HERNIA;  Surgeon: Harl Bowie, MD;  Location: [facility];  Service: General;  Laterality: N/A;   INSERTION OF MESH N/A 01/29/2013   Procedure: INSERTION OF MESH;  Surgeon: Harl Bowie, MD;  Location: [facility];  Service: General;  Laterality: N/A;   LAPAROSCOPY  03/21/2022   Procedure: DIAGNOSTIC LAPAROSCOPY;  Surgeon: Drema Dallas, DO;  Location: [place];  Service: Gynecology;;   LEFT HEART CATHETERIZATION WITH CORONARY ANGIOGRAM N/A 07/15/2012   Procedure: LEFT HEART CATHETERIZATION WITH CORONARY ANGIOGRAM;  Surgeon: Leonie Man, MD;  Location: [facility] CATH LAB: pRCA 80-90%, OM2 BMS ~10% ISR   NM MYOVIEW LTD  08/2014    Normal LV size and function. Normal wall motion. LOW RISK.   PERCUTANEOUS CORONARY STENT INTERVENTION (PCI-S)  07/15/2012   Procedure: PERCUTANEOUS CORONARY STENT INTERVENTION (PCI-S);  Surgeon: Leonie Man, MD;  Location: [facility] CATH LAB: c) PCI to proximal RCA with Promus Premier DES 2.75 mm x 20 mm (3.0 mm)   REPLACEMENT TOTAL KNEE Left 2003   TOTAL HIP ARTHROPLASTY  09/08/2007   left, Dr. Wynelle Link   TOTAL HIP ARTHROPLASTY Right 2003   TRANSTHORACIC ECHOCARDIOGRAM  08/2012   Normal LV size & function.   EF 55-60%, no regional WMA, Gr 1 DD, mild MR, mild-mod TR; aortic sclerosis without stenosis    Social History:  reports that she quit smoking about 40 years ago. Her smoking use included cigarettes. She has never used smokeless tobacco. She reports current alcohol use. She reports that she does not use drugs.   Allergies  Allergen Reactions   Atorvastatin Shortness Of Breath   Brilinta [Ticagrelor] Shortness Of Breath    BLEEDING   Clopidogrel Bisulfate Shortness Of Breath    BRUISING   Codeine Nausea And Vomiting   Sulfonamide Derivatives Other (See Comments)    Intestinal upset    Family History  Problem Relation Age of Onset   Pancreatic cancer Father    Prostate cancer Father    Skin cancer Father    Heart disease Mother    Hypertension Mother    Pancreatic cancer Brother    Heart disease Sister    Colon cancer Brother        ? Colostomy  Prostate cancer Brother    Colon cancer Sister    Melanoma Brother       Prior to Admission medications   Medication Sig Start Date End Date Taking? Authorizing Provider  acetaminophen (TYLENOL) 500 MG tablet Take 500 mg by mouth every 8 (eight) hours as needed for moderate pain.    [provider]  albuterol (PROVENTIL HFA;VENTOLIN HFA) 108 (90 BASE) MCG/ACT inhaler Inhale 2 puffs into the lungs every 6 (six) hours as needed for wheezing.    [provider]  aspirin EC 81 MG tablet Take 81 mg by mouth 3 (three) times a week. Swallow whole.    [provider]  CALCIUM-MAGNESIUM-ZINC PO Take 0.5 tablets by mouth daily.    [provider]  fluticasone (FLONASE) 50 MCG/ACT nasal spray Place 1 spray into the nose daily as needed for allergies.    [provider]  loratadine (CLARITIN) 10 MG tablet Take 10 mg by mouth daily as needed for allergies.    [provider]  melatonin 5 MG TABS Take 5 mg by mouth at bedtime as needed (sleep).    [provider]  metoprolol tartrate  (LOPRESSOR) 25 MG tablet Take 12.5 mg by mouth 2 (two) times daily.    [provider]  nitroGLYCERIN (NITROSTAT) 0.4 MG SL tablet DISSOLVE 1 TABLET UNDER THE TONGUE EVERY 5 MINUTES AS NEEDED FOR CHEST PAIN Patient not taking: Reported on 03/19/2022 10/01/16   Leonie Man, MD  triamterene-hydrochlorothiazide (DYAZIDE) 37.5-25 MG capsule Take 1 capsule daily by mouth.    [provider]    Physical Exam: BP (!) 140/75   Pulse 87   Temp 97.8 F (36.6 C) (Oral)   Resp 12   Ht '4\' 11"'$  (1.499 m)   Wt 66.2 kg   SpO2 100%   BMI 29.49 kg/m   General: 89 y.o. year-old female well developed well nourished in no acute distress.  Alert and oriented x3. Cardiovascular: Irregular rate and rhythm with no rubs or gallops.  No thyromegaly or JVD noted.  No lower extremity edema. 2/4 pulses in all 4 extremities. Respiratory: Clear to auscultation with no wheezes or rales. Good inspiratory effort. Abdomen: Soft nontender nondistended with normal bowel sounds x4 quadrants. Muskuloskeletal: No cyanosis, clubbing or edema noted bilaterally Neuro: CN II-XII intact, strength, sensation, reflexes Skin: No ulcerative lesions noted or rashes Psychiatry: Judgement and insight appear normal. Mood is appropriate for condition and setting          Labs on Admission:  Basic Metabolic Panel: Recent Labs  Lab 07/27/22 1739 07/27/22 2049  NA 136  --   K 3.9  --   CL 102  --   CO2 21*  --   GLUCOSE 129*  --   BUN 17  --   CREATININE 1.08*  --   CALCIUM 10.2  --   MG  --  2.0   Liver Function Tests: No results for input(s): "AST", "ALT", "ALKPHOS", "BILITOT", "PROT", "ALBUMIN" in the last 168 hours. No results for input(s): "LIPASE", "AMYLASE" in the last 168 hours. No results for input(s): "AMMONIA" in the last 168 hours. CBC: Recent Labs  Lab 07/27/22 1739  WBC 7.9  HGB 12.1  HCT 35.5*  MCV 93.4  PLT 288   Cardiac Enzymes: No results for input(s): "CKTOTAL", "CKMB",  "CKMBINDEX", "TROPONINI" in the last 168 hours.  BNP (last 3 results) No results for input(s): "BNP" in the last 8760 hours.  ProBNP (last 3 results)  No results for input(s): "PROBNP" in the last 8760 hours.  CBG: No results for input(s): "GLUCAP" in the last 168 hours.  Radiological Exams on Admission: DG Chest Port 1 View  Result Date: 07/27/2022 CLINICAL DATA:  AFib EXAM: PORTABLE CHEST 1 VIEW COMPARISON:  Radiograph 07/27/2022 FINDINGS: Stable cardiomediastinal silhouette. Low lung volumes accentuate pulmonary vascularity. Aortic atherosclerotic calcification. Left basilar atelectasis. No pleural effusion or pneumothorax. Elevated right hemidiaphragm. Degenerative arthritis both shoulders. IMPRESSION: No active disease. Electronically Signed   By: Placido Sou M.D.   On: 07/27/2022 21:20    EKG: I independently viewed the EKG done and my findings are as followed: A-fib, rate of 107.  Nonspecific ST-T changes.  QTc 450.  Assessment/Plan Present on Admission:  Atrial fibrillation with rapid ventricular response (HCC)  Principal Problem:   Atrial fibrillation with rapid ventricular response (HCC)  New onset atrial fibrillation with rapid ventricular response Received Cardizem bolus, started on Cardizem drip CHA2DS2-VASc score 5 Started on heparin drip for primary CVA prevention. Follow 2D echo for new atrial fibrillation Consult cardiology in the morning Continue to monitor on telemetry  Elevated troponin, suspect demand ischemia in the setting of A-fib with RVR. Initial high-sensitivity troponin 80, repeat 79. No evidence of acute ischemia on twelve-lead EKG Monitor on telemetry  History of colon cancer History of postmenopausal bleeding Closely monitor H&H while on anticoagulation Currently hemoglobin is stable at 12.1, which is baseline.  Coronary artery disease status post PCI with stenting on aspirin Continue home aspirin, scheduled 3 times a week, for now until  seen by cardiology Not on cholesterol medication prior to admission.  CKD 3B Appears to be at her baseline creatinine 1.08 with GFR 48. Avoid nephrotoxic agents, dehydration and hypotension Monitor urine output Repeat chemistry panel in the morning.  Mild non anion gap metabolic acidosis Serum bicarb 21, anion gap 13 Gentle IV fluid hydration LR 50 cc/h x 1 day.   DVT prophylaxis: Heparin drip  Code Status: Full code  Family Communication: None at bedside  Disposition Plan: Admitted to progressive care unit  Consults called: Consult cardiology in the morning  Admission status: Inpatient status.   Status is: Inpatient The patient requires at least 2 midnights for further evaluation and treatment of present condition.   Kayleen Memos MD [facility] Pager [phone]  If 7PM-7AM, please contact night-coverage [email or web address] Password [facility]  07/27/2022, 10:11 PM

## 2022-07-27 NOTE — ED Triage Notes (Addendum)
Patient BIB [facility] from [place] walk in for evaluation of shortness of breath, patient found to be in afib with no history. Patient denies chest pain, is alert and in no apparent distress at this time. Patient sent to ED for further evaluation. 20g left AC.

## 2022-07-27 NOTE — ED Notes (Signed)
Admitting provider at bedside

## 2022-07-27 NOTE — ED Notes (Signed)
Patient states she does not want to be evaluated in the emergency department and did not want to be transported here. Patient states "if my heart stops beating, I'm old and that is what happens". Patient is alert, oriented, and in no apparent distress at this time. Patient demanding to be taken to the lobby so that she can call her daughter to take her home.

## 2022-07-27 NOTE — ED Provider Notes (Signed)
[facility] Provider Note   CSN: [id number] Arrival date & time: 07/27/22  1648     History  Chief Complaint  Patient presents with   Atrial Fibrillation    [name] is a 89 y.o. female.  Pt is a 89 yo female with pmhx significant for cad, hld, htn, colon cancer, arthritis, anemia, and hoh.  Pt said she went to the [facility] clinic for sob.  She was found to be in afib.  She has never had afib before.  EMS was called to bring her here.  Pt denies f/c.  No cough.         Home Medications Prior to Admission medications   Medication Sig Start Date End Date Taking? Authorizing Provider  acetaminophen (TYLENOL) 500 MG tablet Take 500 mg by mouth every 8 (eight) hours as needed for moderate pain.    [provider]  albuterol (PROVENTIL HFA;VENTOLIN HFA) 108 (90 BASE) MCG/ACT inhaler Inhale 2 puffs into the lungs every 6 (six) hours as needed for wheezing.    [provider]  aspirin EC 81 MG tablet Take 81 mg by mouth 3 (three) times a week. Swallow whole.    [provider]  CALCIUM-MAGNESIUM-ZINC PO Take 0.5 tablets by mouth daily.    [provider]  fluticasone (FLONASE) 50 MCG/ACT nasal spray Place 1 spray into the nose daily as needed for allergies.    [provider]  loratadine (CLARITIN) 10 MG tablet Take 10 mg by mouth daily as needed for allergies.    [provider]  melatonin 5 MG TABS Take 5 mg by mouth at bedtime as needed (sleep).    [provider]  metoprolol tartrate (LOPRESSOR) 25 MG tablet Take 12.5 mg by mouth 2 (two) times daily.    [provider]  nitroGLYCERIN (NITROSTAT) 0.4 MG SL tablet DISSOLVE 1 TABLET UNDER THE TONGUE EVERY 5 MINUTES AS NEEDED FOR CHEST PAIN Patient not taking: Reported on 03/19/2022 10/01/16   Leonie Man, MD  triamterene-hydrochlorothiazide (DYAZIDE) 37.5-25 MG capsule Take 1 capsule daily by mouth.     [provider]      Allergies    Atorvastatin, Brilinta [ticagrelor], Clopidogrel bisulfate, Codeine, and Sulfonamide derivatives    Review of Systems   Review of Systems  Respiratory:  Positive for shortness of breath.   All other systems reviewed and are negative.   Physical Exam Updated Vital Signs BP (!) 140/75   Pulse 87   Temp 97.8 F (36.6 C) (Oral)   Resp 12   Ht '4\' 11"'$  (1.499 m)   Wt 66.2 kg   SpO2 100%   BMI 29.49 kg/m  Physical Exam Vitals and nursing note reviewed.  Constitutional:      Appearance: Normal appearance.  HENT:     Head: Normocephalic and atraumatic.     Right Ear: External ear normal.     Left Ear: External ear normal.     Nose: Nose normal.     Mouth/Throat:     Mouth: Mucous membranes are moist.     Pharynx: Oropharynx is clear.  Eyes:     Extraocular Movements: Extraocular movements intact.     Conjunctiva/sclera: Conjunctivae normal.     Pupils: Pupils are equal, round, and reactive to light.  Cardiovascular:     Rate and Rhythm: Tachycardia present. Rhythm irregular.     Pulses: Normal pulses.     Heart sounds: Normal heart sounds.  Pulmonary:     Effort: Pulmonary effort is normal.     Breath sounds: Normal breath sounds.  Abdominal:     General: Abdomen is flat. Bowel sounds are normal.     Palpations: Abdomen is soft.  Musculoskeletal:        General: Normal range of motion.     Cervical back: Normal range of motion and neck supple.  Skin:    General: Skin is warm.     Capillary Refill: Capillary refill takes less than 2 seconds.  Neurological:     General: No focal deficit present.     Mental Status: She is alert and oriented to person, place, and time.  Psychiatric:        Mood and Affect: Mood normal.        Behavior: Behavior normal.     ED Results / Procedures / Treatments   Labs (all labs ordered are listed, but only abnormal results are displayed) Labs Reviewed  BASIC METABOLIC PANEL - Abnormal;  Notable for the following components:      Result Value   CO2 21 (*)    Glucose, Bld 129 (*)    Creatinine, Ser 1.08 (*)    GFR, Estimated 48 (*)    All other components within normal limits  CBC - Abnormal; Notable for the following components:   RBC 3.80 (*)    HCT 35.5 (*)    All other components within normal limits  TROPONIN I (HIGH SENSITIVITY) - Abnormal; Notable for the following components:   Troponin I (High Sensitivity) 80 (*)    All other components within normal limits  TROPONIN I (HIGH SENSITIVITY) - Abnormal; Notable for the following components:   Troponin I (High Sensitivity) 79 (*)    All other components within normal limits  RESP PANEL BY RT-PCR (RSV, FLU A&B, COVID)  RVPGX2  TSH  MAGNESIUM  HEPARIN LEVEL (UNFRACTIONATED)  CBC    EKG EKG Interpretation  Date/Time:  Friday July 27 2022 20:51:22 EST Ventricular Rate:  107 PR Interval:    QRS Duration: 75 QT Interval:  337 QTC Calculation: 450 R Axis:   -8 Text Interpretation: Atrial fibrillation Low voltage, precordial leads Minimal ST depression, lateral leads No significant change since last tracing Confirmed by Isla Pence [phone]) on 07/27/2022 8:53:26 PM  Radiology DG Chest Port 1 View  Result Date: 07/27/2022 CLINICAL DATA:  AFib EXAM: PORTABLE CHEST 1 VIEW COMPARISON:  Radiograph 07/27/2022 FINDINGS: Stable cardiomediastinal silhouette. Low lung volumes accentuate pulmonary vascularity. Aortic atherosclerotic calcification. Left basilar atelectasis. No pleural effusion or pneumothorax. Elevated right hemidiaphragm. Degenerative arthritis both shoulders. IMPRESSION: No active disease. Electronically Signed   By: Placido Sou M.D.   On: 07/27/2022 21:20    Procedures Procedures    Medications Ordered in ED Medications  diltiazem (CARDIZEM) 1 mg/mL load via infusion 20 mg (20 mg Intravenous Bolus from Bag 07/27/22 2131)    And  diltiazem (CARDIZEM) 125 mg in dextrose 5% 125 mL (1 mg/mL)  infusion (5 mg/hr Intravenous New Bag/Given 07/27/22 2130)  heparin ADULT infusion 100 units/mL (25000 units/260m) (850 Units/hr Intravenous New Bag/Given 07/27/22 2133)  acetaminophen (TYLENOL) tablet 1,000 mg (has no administration in time range)  heparin bolus via infusion 3,000 Units (3,000 Units Intravenous Bolus from Bag 07/27/22 2133)    ED Course/ Medical Decision Making/ A&P  Medical Decision Making Amount and/or Complexity of Data Reviewed Labs: ordered. Radiology: ordered.  Risk OTC drugs. Prescription drug management. Decision regarding hospitalization.   This patient presents to the ED for concern of sob, this involves an extensive number of treatment options, and is a complaint that carries with it a high risk of complications and morbidity.  The differential diagnosis includes afib, pna, covid/flu, electrolyte abn   Co morbidities that complicate the patient evaluation  cad, hld, htn, colon cancer, arthritis, anemia, and hoh   Additional history obtained:  Additional history obtained from epic chart review External records from outside source obtained and reviewed including daughter   Lab Tests:  I Ordered, and personally interpreted labs.  The pertinent results include:  cbc nl, bmp nl; trop elevated at 80, but 2nd trop is flat at 79; tsh nl; covid/flu neg   Imaging Studies ordered:  I ordered imaging studies including cxr  I independently visualized and interpreted imaging which showed No active disease.  I agree with the radiologist interpretation   Cardiac Monitoring:  The patient was maintained on a cardiac monitor.  I personally viewed and interpreted the cardiac monitored which showed an underlying rhythm of: afib with rvr   Medicines ordered and prescription drug management:  I ordered medication including cardizem  and heparin for afib   Reevaluation of the patient after these medicines showed that the patient  improved I have reviewed the patients home medicines and have made adjustments as needed  Critical Interventions:  Cardizem/heparin   Consultations Obtained:  I requested consultation with the hospitalist (Dr. [place] Crane),  and discussed lab and imaging findings as well as pertinent plan - she will admit   Problem List / ED Course:  Afib with RVR:  new onset.  HR controlled on dilt.  Heparin started. CHA2DS2/VAS Stroke Risk Points: 76 (age, female, htn, hx cad)         Reevaluation:  After the interventions noted above, I reevaluated the patient and found that they have :improved   Social Determinants of Health:  Lives at home   Dispostion:  After consideration of the diagnostic results and the patients response to treatment, I feel that the patent would benefit from admission.    CRITICAL CARE Performed by: Isla Pence   Total critical care time: 30 minutes  Critical care time was exclusive of separately billable procedures and treating other patients.  Critical care was necessary to treat or prevent imminent or life-threatening deterioration.  Critical care was time spent personally by me on the following activities: development of treatment plan with patient and/or surrogate as well as nursing, discussions with consultants, evaluation of patient's response to treatment, examination of patient, obtaining history from patient or surrogate, ordering and performing treatments and interventions, ordering and review of laboratory studies, ordering and review of radiographic studies, pulse oximetry and re-evaluation of patient's condition.         Final Clinical Impression(s) / ED Diagnoses Final diagnoses:  Atrial fibrillation with RVR [facility])    Rx / DC Orders ED Discharge Orders     None         Isla Pence, MD 07/27/22 2210

## 2022-07-27 NOTE — ED Notes (Signed)
ED Provider at bedside. 

## 2022-07-27 NOTE — ED Triage Notes (Signed)
Patient BIB [facility] from [place] walk in for evaluation of shortness of breath, patient found to be in afib with no history. Patient denies chest pain, is alert and in no apparent distress at this time. Patient sent to ED for further evaluation. Upon arrival to ED, patient insistent that she be taken to the lobby to call her daughter to pick her up and take her home. After patient's daughter arrived to ED she talked to patient who decided to check back in and be evaluated.

## 2022-07-28 DIAGNOSIS — I4891 Unspecified atrial fibrillation: Secondary | ICD-10-CM | POA: Diagnosis not present

## 2022-07-28 LAB — COMPREHENSIVE METABOLIC PANEL
ALT: 17 U/L (ref 0–44)
AST: 25 U/L (ref 15–41)
Albumin: 3.7 g/dL (ref 3.5–5.0)
Alkaline Phosphatase: 49 U/L (ref 38–126)
Anion gap: 10 (ref 5–15)
BUN: 19 mg/dL (ref 8–23)
CO2: 23 mmol/L (ref 22–32)
Calcium: 9.5 mg/dL (ref 8.9–10.3)
Chloride: 105 mmol/L (ref 98–111)
Creatinine, Ser: 1.1 mg/dL — ABNORMAL HIGH (ref 0.44–1.00)
GFR, Estimated: 47 mL/min — ABNORMAL LOW (ref >60)
Glucose, Bld: 129 mg/dL — ABNORMAL HIGH (ref 70–99)
Potassium: 3.7 mmol/L (ref 3.5–5.1)
Sodium: 138 mmol/L (ref 135–145)
Total Bilirubin: 1.9 mg/dL — ABNORMAL HIGH (ref 0.3–1.2)
Total Protein: 6.4 g/dL — ABNORMAL LOW (ref 6.5–8.1)

## 2022-07-28 LAB — MAGNESIUM: Magnesium: 1.9 mg/dL (ref 1.7–2.4)

## 2022-07-28 LAB — PHOSPHORUS: Phosphorus: 3.5 mg/dL (ref 2.5–4.6)

## 2022-07-28 LAB — CBC
HCT: 33 % — ABNORMAL LOW (ref 36.0–46.0)
Hemoglobin: 11.1 g/dL — ABNORMAL LOW (ref 12.0–15.0)
MCH: 31.5 pg (ref 26.0–34.0)
MCHC: 33.6 g/dL (ref 30.0–36.0)
MCV: 93.8 fL (ref 80.0–100.0)
Platelets: 248 10*3/uL (ref 150–400)
RBC: 3.52 MIL/uL — ABNORMAL LOW (ref 3.87–5.11)
RDW: 13.4 % (ref 11.5–15.5)
WBC: 8.6 10*3/uL (ref 4.0–10.5)
nRBC: 0 % (ref 0.0–0.2)

## 2022-07-28 LAB — HEPARIN LEVEL (UNFRACTIONATED)
Heparin Unfractionated: >1.1 IU/mL — ABNORMAL HIGH (ref 0.30–0.70)
Heparin Unfractionated: 0.29 IU/mL — ABNORMAL LOW (ref 0.30–0.70)

## 2022-07-28 MED ORDER — HEPARIN (PORCINE) 25000 UT/250ML-% IV SOLN: 950.0000 [IU]/h | INTRAVENOUS | Status: DC

## 2022-07-28 MED ORDER — DILTIAZEM HCL 60 MG PO TABS
60.0000 mg | ORAL_TABLET | Freq: Three times a day (TID) | ORAL | Status: DC
  Administered 2022-07-28 – 2022-07-29 (×3): 60 mg via ORAL
  Filled 2022-07-28 (×5): qty 1

## 2022-07-28 MED ORDER — APIXABAN 5 MG PO TABS: 5.0000 mg | ORAL_TABLET | Freq: Two times a day (BID) | ORAL | Status: DC

## 2022-07-28 MED ORDER — APIXABAN 2.5 MG PO TABS: 2.5000 mg | ORAL_TABLET | Freq: Two times a day (BID) | ORAL | Status: DC

## 2022-07-28 MED ORDER — HEPARIN BOLUS VIA INFUSION
750.0000 [IU] | Freq: Once | INTRAVENOUS | Status: AC
  Administered 2022-07-28: 750 [IU] via INTRAVENOUS
  Filled 2022-07-28: qty 750

## 2022-07-28 MED ORDER — ORAL CARE MOUTH RINSE: 15.0000 mL | OROMUCOSAL | Status: DC | PRN

## 2022-07-28 MED ORDER — APIXABAN 5 MG PO TABS
5.0000 mg | ORAL_TABLET | Freq: Two times a day (BID) | ORAL | Status: DC
  Administered 2022-07-28 – 2022-07-29 (×4): 5 mg via ORAL
  Filled 2022-07-28 (×4): qty 1

## 2022-07-28 MED ORDER — HEPARIN (PORCINE) 25000 UT/250ML-% IV SOLN: 700.0000 [IU]/h | INTRAVENOUS | Status: DC

## 2022-07-28 NOTE — Progress Notes (Addendum)
ANTICOAGULATION CONSULT NOTE   Pharmacy Consult for Heparin infusion Indication: atrial fibrillation  Allergies  Allergen Reactions   Atorvastatin Shortness Of Breath   Brilinta [Ticagrelor] Shortness Of Breath    BLEEDING   Clopidogrel Bisulfate Shortness Of Breath    BRUISING   Codeine Nausea And Vomiting   Sulfonamide Derivatives Other (See Comments)    Intestinal upset   Patient Measurements: Height: '4\' 11"'$  (149.9 cm) Weight: 66.2 kg (146 lb) IBW/kg (Calculated) : 43.2 Heparin Dosing Weight: 57.7 kg  Vital Signs: Temp: 98.4 F (36.9 C) (01/27 0514) Temp Source: Oral (01/27 0514) BP: 122/60 (01/27 0514) Pulse Rate: 77 (01/27 0514)  Labs: Recent Labs    07/27/22 1739 07/27/22 2049 07/28/22 0500  HGB 12.1  --  11.1*  HCT 35.5*  --  33.0*  PLT 288  --  248  HEPARINUNFRC  --   --  >1.10*  CREATININE 1.08*  --  1.10*  TROPONINIHS 80* 79*  --      Estimated Creatinine Clearance: 27 mL/min (A) (by C-G formula based on SCr of 1.1 mg/dL (H)).   Medical History: Past Medical History:  Diagnosis Date   Adenomatous colon polyp MAY 2013   HOSPITALIZED AT [facility] WITH HGB 7 -TRANSFUSED-MASS FOUND IN ASCENDING COLON   Aortic sclerosis    With murmur   Asthma    CAD S/P percutaneous coronary angioplasty 2002, 2003,2014   a) Unstable Angina 7/'02: 1st Cutting PTCA- OM2 --> restenosed 9/'02 NSTEMI--> 3.0 mm x 12 mm BMS-OM 2; b) 11/'03 DOE w/ + Cardiolite - Cutter PTCA for ISR -- patent in 2004 (after False + Cardiolite); c) 07/2012 - Unstable Angina -- PCI to proximal RCA with Promus Premier DES 2.75 mm x 20 mm (3.0 mm), patent OM2 stent ~10% ISR (HARDING)   Complication of anesthesia 2009   HIP REPLACEMENT-PT HAD HARD TIME WAKING UP--FELT LIKE SHE COULDN'T BREATHE   Coronary stent restenosis due to scar tissue 05/2002   Cutting Balloon PTCA to OM2 BMS   Diverticulosis 10/2012   Seen on Colonoscopy   DJD (degenerative joint disease), thoracolumbar    Back, Hips & Knees    Essential hypertension    H/O Colon cancer 10/2011   GI - Drs. Carlean Purl - colonoscopy 10/2012: diverticulosis, stable ileocolic anastomosis of R colon   Hyperlipidemia with target LDL less than 70    Internal hemorrhoids    Iron deficiency anemia due to chronic blood loss 10/2011   Blood Transfusion (EGD 09/2012 - unremarkable)   Medication intolerance - Plavix, Effient, Brilinta    Easy bruising, GI bleeds (led to Dx of Colon CA), Brilinta - dyspnea   Multiple lung nodules on CT 08/2012   scattered, bilateral but right>left.    Non-Q wave ST elevation myocardial infarction (STEMI) involving left circumflex coronary artery 03/2001   a) Severe thrombic ISR of prior PTCA site in OM2 --> BMS PCI; b) Echo 08/2012: Normal LV size & function.  EF 55-60%, no regional WMA, Gr 1 DD, mild MR, mild-mod TR - NO Pulmonary HTN   Osteoarthritis (arthritis due to wear and tear of joints)    Bilateral Hip Arthroplasty, L Knee TKA   Osteoporosis     Medications:  (Not in a hospital admission)  Assessment: 89 yo F presenting from [facility] physicians clinic after eval for SOB and found to be in Afib w/ RVR. Pt is not taking any oral anticoagulation prior to admission. Diltiazem infusion started for rate control of Afib in the ED.  Pharmacy consulted to dose heparin infusion for Afib.   Hgb 12.1, Plt 288 No s/sx of bleeding  1/27 AM update:  Heparin level drawn from same arm as infusion  Goal of Therapy:  Heparin level 0.3-0.7 units/ml Monitor platelets by anticoagulation protocol: Yes   Plan:  Re-drawn heparin level  Narda Bonds, PharmD, BCPS Clinical Pharmacist Phone: [phone]

## 2022-07-28 NOTE — ED Notes (Signed)
ED TO INPATIENT HANDOFF REPORT  ED Nurse Name and Phone #: Anderson Malta [phone]  S Name/Age/Gender [name] 89 y.o. female Room/Bed: 012C/012C  Code Status   Code Status: Full Code  Home/SNF/Other Home Patient oriented to: self, place, time, and situation Is this baseline? Yes   Triage Complete: Triage complete  Chief Complaint Atrial fibrillation with rapid ventricular response ([place]) [I48.91]  Triage Note Patient BIB [facility] from [facility] walk in for evaluation of shortness of breath, patient found to be in afib with no history. Patient denies chest pain, is alert and in no apparent distress at this time. Patient sent to ED for further evaluation. Upon arrival to ED, patient insistent that she be taken to the lobby to call her daughter to pick her up and take her home. After patient's daughter arrived to ED she talked to patient who decided to check back in and be evaluated.   Allergies Allergies  Allergen Reactions   Atorvastatin Shortness Of Breath   Brilinta [Ticagrelor] Shortness Of Breath    BLEEDING   Clopidogrel Bisulfate Shortness Of Breath    BRUISING   Codeine Nausea And Vomiting   Sulfonamide Derivatives Other (See Comments)    Intestinal upset    Level of Care/Admitting Diagnosis ED Disposition     ED Disposition  Admit   Condition  --   Comment  Hospital Area: [place] [[id number]]  Level of Care: Progressive [102]  Admit to Progressive based on following criteria: CARDIOVASCULAR & THORACIC of moderate stability with acute coronary syndrome symptoms/low risk myocardial infarction/hypertensive urgency/arrhythmias/heart failure potentially compromising stability and stable post cardiovascular intervention patients.  May admit patient to [facility] or [facility] if equivalent level of care is available:: No  Covid Evaluation: Asymptomatic - no recent exposure (last 10 days) testing not required  Diagnosis: Atrial fibrillation  with rapid ventricular response [facility]) [[id number]]  Admitting Physician: Kayleen Memos [[id number]]  Attending Physician: Kayleen Memos [[id number]]  Certification:: I certify this patient will need inpatient services for at least 2 midnights  Estimated Length of Stay: 2          B Medical/Surgery History Past Medical History:  Diagnosis Date   Adenomatous colon polyp MAY 2013   HOSPITALIZED AT [facility] WITH HGB 7 -TRANSFUSED-MASS FOUND IN ASCENDING COLON   Aortic sclerosis    With murmur   Asthma    CAD S/P percutaneous coronary angioplasty 2002, 2003,2014   a) Unstable Angina 7/'02: 1st Cutting PTCA- OM2 --> restenosed 9/'02 NSTEMI--> 3.0 mm x 12 mm BMS-OM 2; b) 11/'03 DOE w/ + Cardiolite - Cutter PTCA for ISR -- patent in 2004 (after False + Cardiolite); c) 07/2012 - Unstable Angina -- PCI to proximal RCA with Promus Premier DES 2.75 mm x 20 mm (3.0 mm), patent OM2 stent ~10% ISR (HARDING)   Complication of anesthesia 2009   HIP REPLACEMENT-PT HAD HARD TIME WAKING UP--FELT LIKE SHE COULDN'T BREATHE   Coronary stent restenosis due to scar tissue 05/2002   Cutting Balloon PTCA to OM2 BMS   Diverticulosis 10/2012   Seen on Colonoscopy   DJD (degenerative joint disease), thoracolumbar    Back, Hips & Knees   Essential hypertension    H/O Colon cancer 10/2011   GI - Drs. Carlean Purl - colonoscopy 10/2012: diverticulosis, stable ileocolic anastomosis of R colon   Hyperlipidemia with target LDL less than 70    Internal hemorrhoids    Iron deficiency anemia due to chronic blood loss 10/2011  Blood Transfusion (EGD 09/2012 - unremarkable)   Medication intolerance - Plavix, Effient, Brilinta    Easy bruising, GI bleeds (led to Dx of Colon CA), Brilinta - dyspnea   Multiple lung nodules on CT 08/2012   scattered, bilateral but right>left.    Non-Q wave ST elevation myocardial infarction (STEMI) involving left circumflex coronary artery 03/2001   a) Severe thrombic ISR of prior PTCA site in OM2 --> BMS  PCI; b) Echo 08/2012: Normal LV size & function.  EF 55-60%, no regional WMA, Gr 1 DD, mild MR, mild-mod TR - NO Pulmonary HTN   Osteoarthritis (arthritis due to wear and tear of joints)    Bilateral Hip Arthroplasty, L Knee TKA   Osteoporosis    Past Surgical History:  Procedure Laterality Date   APPENDECTOMY     CHOLECYSTECTOMY     COLONOSCOPY  11/16/2011   Procedure: COLONOSCOPY;  Surgeon: Gatha Mayer, MD;  Location: [place];  Service: Endoscopy;  Laterality: N/A;   COLONOSCOPY  10/2012   Carlean Purl: diverticulosis, stable R colon ileocolic anastomosis   COLONOSCOPY W/ BIOPSIES AND POLYPECTOMY  06/15/2008   adenomatous polyps, diverticulosis, internal hemorrhoids   COLONOSCOPY WITH PROPOFOL N/A 07/18/2015   Procedure: COLONOSCOPY WITH PROPOFOL;  Surgeon: Gatha Mayer, MD;  Location: [facility];  Service: Endoscopy;  Laterality: N/A;   CORONARY ANGIOPLASTY WITH STENT PLACEMENT  2002; 2003; 07/2012   a)  7/'02: 1st Cutting PTCA- OM2 --> restenosed 9/'02 --> 3.0 mm x 12 mm BMS-OM 2; b) 11/'03 - Cutter PTCA for ISR -- patent in 2004;    [place] N/A 03/21/2022   Procedure: DILATATION & CURETTAGE/HYSTEROSCOPY WITH MYOSURE, PAP SMEAR;  Surgeon: Drema Dallas, DO;  Location: [place];  Service: Gynecology;  Laterality: N/A;   ESOPHAGOGASTRODUODENOSCOPY N/A 10/23/2012   Procedure: ESOPHAGOGASTRODUODENOSCOPY (EGD);  Surgeon: Inda Castle, MD;  Location: [place];  Service: Endoscopy;  Laterality: N/A;   INCISIONAL HERNIA REPAIR N/A 01/29/2013   Procedure: LAPAROSCOPIC INCISIONAL HERNIA;  Surgeon: Harl Bowie, MD;  Location: [facility];  Service: General;  Laterality: N/A;   INSERTION OF MESH N/A 01/29/2013   Procedure: INSERTION OF MESH;  Surgeon: Harl Bowie, MD;  Location: [facility];  Service: General;  Laterality: N/A;   LAPAROSCOPY  03/21/2022   Procedure: DIAGNOSTIC LAPAROSCOPY;  Surgeon: Drema Dallas, DO;  Location: [place];  Service:  Gynecology;;   LEFT HEART CATHETERIZATION WITH CORONARY ANGIOGRAM N/A 07/15/2012   Procedure: LEFT HEART CATHETERIZATION WITH CORONARY ANGIOGRAM;  Surgeon: Leonie Man, MD;  Location: [facility] CATH LAB: pRCA 80-90%, OM2 BMS ~10% ISR   NM MYOVIEW LTD  08/2014    Normal LV size and function. Normal wall motion. LOW RISK.   PERCUTANEOUS CORONARY STENT INTERVENTION (PCI-S)  07/15/2012   Procedure: PERCUTANEOUS CORONARY STENT INTERVENTION (PCI-S);  Surgeon: Leonie Man, MD;  Location: [facility] CATH LAB: c) PCI to proximal RCA with Promus Premier DES 2.75 mm x 20 mm (3.0 mm)   REPLACEMENT TOTAL KNEE Left 2003   TOTAL HIP ARTHROPLASTY  09/08/2007   left, Dr. Wynelle Link   TOTAL HIP ARTHROPLASTY Right 2003   TRANSTHORACIC ECHOCARDIOGRAM  08/2012   Normal LV size & function.  EF 55-60%, no regional WMA, Gr 1 DD, mild MR, mild-mod TR; aortic sclerosis without stenosis     A IV Location/Drains/Wounds Patient Lines/Drains/Airways Status     Active Line/Drains/Airways     Name Placement date Placement time Site Days   Peripheral IV 07/27/22  18 G Anterior;Left;Proximal Forearm 07/27/22  2100  Forearm  1   Peripheral IV 07/27/22 20 G Anterior;Distal;Right Forearm 07/27/22  2130  Forearm  1   Incision - 3 Ports Abdomen 1: Mid;Upper 2: Left;Lateral;Lower 3: Left;Lateral;Upper 03/21/22  1106  -- 129            Intake/Output Last 24 hours  Intake/Output Summary (Last 24 hours) at 07/28/2022 1027 Last data filed at 07/28/2022 0645 Gross per 24 hour  Intake 107.06 ml  Output --  Net 107.06 ml    Labs/Imaging Results for orders placed or performed during the hospital encounter of 07/27/22 (from the past 48 hour(s))  Basic metabolic panel     Status: Abnormal   Collection Time: 07/27/22  5:39 PM  Result Value Ref Range   Sodium 136 135 - 145 mmol/L   Potassium 3.9 3.5 - 5.1 mmol/L   Chloride 102 98 - 111 mmol/L   CO2 21 (L) 22 - 32 mmol/L   Glucose, Bld 129 (H) 70 - 99 mg/dL    Comment: Glucose  reference range applies only to samples taken after fasting for at least 8 hours.   BUN 17 8 - 23 mg/dL   Creatinine, Ser 1.08 (H) 0.44 - 1.00 mg/dL   Calcium 10.2 8.9 - 10.3 mg/dL   GFR, Estimated 48 (L) >60 mL/min    Comment: (NOTE) Calculated using the CKD-EPI Creatinine Equation (2021)    Anion gap 13 5 - 15    Comment: Performed at [facility] [address], [place], [place] [address]  CBC     Status: Abnormal   Collection Time: 07/27/22  5:39 PM  Result Value Ref Range   WBC 7.9 4.0 - 10.5 K/uL   RBC 3.80 (L) 3.87 - 5.11 MIL/uL   Hemoglobin 12.1 12.0 - 15.0 g/dL   HCT 35.5 (L) 36.0 - 46.0 %   MCV 93.4 80.0 - 100.0 fL   MCH 31.8 26.0 - 34.0 pg   MCHC 34.1 30.0 - 36.0 g/dL   RDW 13.5 11.5 - 15.5 %   Platelets 288 150 - 400 K/uL   nRBC 0.0 0.0 - 0.2 %    Comment: Performed at [facility], [place] [address], [place], [place] [address]  Troponin I (High Sensitivity)     Status: Abnormal   Collection Time: 07/27/22  5:39 PM  Result Value Ref Range   Troponin I (High Sensitivity) 80 (H) <18 ng/L    Comment: (NOTE) Elevated high sensitivity troponin I (hsTnI) values and significant  changes across serial measurements may suggest ACS but many other  chronic and acute conditions are known to elevate hsTnI results.  Refer to the "Links" section for chest pain algorithms and additional  guidance. Performed at [facility], [place] [address] [place], [place] [address]   TSH     Status: None   Collection Time: 07/27/22  5:39 PM  Result Value Ref Range   TSH 1.806 0.350 - 4.500 uIU/mL    Comment: Performed by a 3rd Generation assay with a functional sensitivity of <=0.01 uIU/mL. Performed at [facility], [place] [address], [place], [place] [address]   Troponin I (High Sensitivity)     Status: Abnormal   Collection Time: 07/27/22  8:49 PM  Result Value Ref Range   Troponin I (High Sensitivity) 79 (H) <18 ng/L    Comment: (NOTE) Elevated high sensitivity  troponin I (hsTnI) values and significant  changes across  serial measurements may suggest ACS but many other  chronic and acute conditions are known to elevate hsTnI results.  Refer to the "Links" section for chest pain algorithms and additional  guidance. Performed at [facility], [place] [address] [place], [place] [address]   Magnesium     Status: None   Collection Time: 07/27/22  8:49 PM  Result Value Ref Range   Magnesium 2.0 1.7 - 2.4 mg/dL    Comment: Performed at [facility], [place] [address] [place], [place] [address]  Resp panel by RT-PCR (RSV, Flu A&B, Covid) Anterior Nasal Swab     Status: None   Collection Time: 07/27/22  8:55 PM   Specimen: Anterior Nasal Swab  Result Value Ref Range   SARS Coronavirus 2 by RT PCR NEGATIVE NEGATIVE    Comment: (NOTE) SARS-CoV-2 target nucleic acids are NOT DETECTED.  The SARS-CoV-2 RNA is generally detectable in upper respiratory specimens during the acute phase of infection. The lowest concentration of SARS-CoV-2 viral copies this assay can detect is 138 copies/mL. A negative result does not preclude SARS-Cov-2 infection and should not be used as the sole basis for treatment or other patient management decisions. A negative result may occur with  improper specimen collection/handling, submission of specimen other than nasopharyngeal swab, presence of viral mutation(s) within the areas targeted by this assay, and inadequate number of viral copies(<138 copies/mL). A negative result must be combined with clinical observations, patient history, and epidemiological information. The expected result is Negative.  Fact Sheet for Patients:  EntrepreneurPulse.com.au  Fact Sheet for Healthcare Providers:  IncredibleEmployment.be  This test is no t yet approved or cleared by the [place] FDA and  has been authorized for detection and/or diagnosis of SARS-CoV-2 by FDA under an Emergency Use  Authorization (EUA). This EUA will remain  in effect (meaning this test can be used) for the duration of the COVID-19 declaration under Section 564(b)(1) of the Act, 21 U.S.C.section 360bbb-3(b)(1), unless the authorization is terminated  or revoked sooner.       Influenza A by PCR NEGATIVE NEGATIVE   Influenza B by PCR NEGATIVE NEGATIVE    Comment: (NOTE) The Xpert Xpress SARS-CoV-2/FLU/RSV plus assay is intended as an aid in the diagnosis of influenza from Nasopharyngeal swab specimens and should not be used as a sole basis for treatment. Nasal washings and aspirates are unacceptable for Xpert Xpress SARS-CoV-2/FLU/RSV testing.  Fact Sheet for Patients: EntrepreneurPulse.com.au  Fact Sheet for Healthcare Providers: IncredibleEmployment.be  This test is not yet approved or cleared by the [place] FDA and has been authorized for detection and/or diagnosis of SARS-CoV-2 by FDA under an Emergency Use Authorization (EUA). This EUA will remain in effect (meaning this test can be used) for the duration of the COVID-19 declaration under Section 564(b)(1) of the Act, 21 U.S.C. section 360bbb-3(b)(1), unless the authorization is terminated or revoked.     Resp Syncytial Virus by PCR NEGATIVE NEGATIVE    Comment: (NOTE) Fact Sheet for Patients: EntrepreneurPulse.com.au  Fact Sheet for Healthcare Providers: IncredibleEmployment.be  This test is not yet approved or cleared by the [place] FDA and has been authorized for detection and/or diagnosis of SARS-CoV-2 by FDA under an Emergency Use Authorization (EUA). This EUA will remain in effect (meaning this test can be used) for the duration of the COVID-19 declaration under Section 564(b)(1) of the Act, 21 U.S.C. section 360bbb-3(b)(1), unless the authorization is terminated or revoked.  Performed at [facility],  [address] [place],  [place] [address]   Heparin level (unfractionated)     Status: Abnormal   Collection Time: 07/28/22  5:00 AM  Result Value Ref Range   Heparin Unfractionated >1.10 (H) 0.30 - 0.70 IU/mL    Comment: (NOTE) The clinical reportable range upper limit is being lowered to >1.10 to align with the FDA approved guidance for the current laboratory assay.  If heparin results are below expected values, and patient dosage has  been confirmed, suggest follow up testing of antithrombin III levels. Performed at [facility], [place] [address] [place], [place] [address]   CBC     Status: Abnormal   Collection Time: 07/28/22  5:00 AM  Result Value Ref Range   WBC 8.6 4.0 - 10.5 K/uL   RBC 3.52 (L) 3.87 - 5.11 MIL/uL   Hemoglobin 11.1 (L) 12.0 - 15.0 g/dL   HCT 33.0 (L) 36.0 - 46.0 %   MCV 93.8 80.0 - 100.0 fL   MCH 31.5 26.0 - 34.0 pg   MCHC 33.6 30.0 - 36.0 g/dL   RDW 13.4 11.5 - 15.5 %   Platelets 248 150 - 400 K/uL   nRBC 0.0 0.0 - 0.2 %    Comment: Performed at [facility], [place] [address], [place], [place] [address]  Comprehensive metabolic panel     Status: Abnormal   Collection Time: 07/28/22  5:00 AM  Result Value Ref Range   Sodium 138 135 - 145 mmol/L   Potassium 3.7 3.5 - 5.1 mmol/L   Chloride 105 98 - 111 mmol/L   CO2 23 22 - 32 mmol/L   Glucose, Bld 129 (H) 70 - 99 mg/dL    Comment: Glucose reference range applies only to samples taken after fasting for at least 8 hours.   BUN 19 8 - 23 mg/dL   Creatinine, Ser 1.10 (H) 0.44 - 1.00 mg/dL   Calcium 9.5 8.9 - 10.3 mg/dL   Total Protein 6.4 (L) 6.5 - 8.1 g/dL   Albumin 3.7 3.5 - 5.0 g/dL   AST 25 15 - 41 U/L   ALT 17 0 - 44 U/L   Alkaline Phosphatase 49 38 - 126 U/L   Total Bilirubin 1.9 (H) 0.3 - 1.2 mg/dL   GFR, Estimated 47 (L) >60 mL/min    Comment: (NOTE) Calculated using the CKD-EPI Creatinine Equation (2021)    Anion gap 10 5 - 15    Comment: Performed at [facility] [address], [place], [place]  [address]  Magnesium     Status: None   Collection Time: 07/28/22  5:00 AM  Result Value Ref Range   Magnesium 1.9 1.7 - 2.4 mg/dL    Comment: Performed at [facility], [place] [address], [place],  [address]  Phosphorus     Status: None   Collection Time: 07/28/22  5:00 AM  Result Value Ref Range   Phosphorus 3.5 2.5 - 4.6 mg/dL    Comment: Performed at [facility] [address] [place], [place] [address]  Heparin level (unfractionated)     Status: Abnormal   Collection Time: 07/28/22  7:28 AM  Result Value Ref Range   Heparin Unfractionated 0.29 (L) 0.30 - 0.70 IU/mL    Comment: (NOTE) The clinical reportable range upper limit is being lowered to >1.10 to align with the FDA approved guidance for the current laboratory assay.  If heparin results are below expected values, and  patient dosage has  been confirmed, suggest follow up testing of antithrombin III levels. Performed at [facility], [place] [address] [place], [place] [address]    DG Chest Port 1 View  Result Date: 07/27/2022 CLINICAL DATA:  AFib EXAM: PORTABLE CHEST 1 VIEW COMPARISON:  Radiograph 07/27/2022 FINDINGS: Stable cardiomediastinal silhouette. Low lung volumes accentuate pulmonary vascularity. Aortic atherosclerotic calcification. Left basilar atelectasis. No pleural effusion or pneumothorax. Elevated right hemidiaphragm. Degenerative arthritis both shoulders. IMPRESSION: No active disease. Electronically Signed   By: Placido Sou M.D.   On: 07/27/2022 21:20    Pending Labs Unresulted Labs (From admission, onward)     Start     Ordered   07/29/22 0500  Heparin level (unfractionated)  Daily,   R      07/27/22 2102   07/28/22 1800  Heparin level (unfractionated)  Once-Timed,   TIMED        07/28/22 0959   07/28/22 0500  CBC  Daily,   R      07/27/22 2102            Vitals/Pain Today's Vitals   07/28/22 0333 07/28/22 0514 07/28/22 0823 07/28/22 0919  BP: (!) 142/70 122/60 122/67    Pulse: 83 77 82   Resp: '17 16 19   '$ Temp: 98.8 F (37.1 C) 98.4 F (36.9 C)  98.3 F (36.8 C)  TempSrc: Oral Oral  Oral  SpO2: 97% 98% 97%   Weight:      Height:      PainSc: 0-No pain 0-No pain      Isolation Precautions No active isolations  Medications Medications  diltiazem (CARDIZEM) 1 mg/mL load via infusion 20 mg (20 mg Intravenous Bolus from Bag 07/27/22 2131)    And  diltiazem (CARDIZEM) 125 mg in dextrose 5% 125 mL (1 mg/mL) infusion (5 mg/hr Intravenous New Bag/Given 07/27/22 2130)  metoprolol tartrate (LOPRESSOR) tablet 12.5 mg (12.5 mg Oral Given 07/28/22 1023)  aspirin EC tablet 81 mg (has no administration in time range)  acetaminophen (TYLENOL) tablet 650 mg (has no administration in time range)  prochlorperazine (COMPAZINE) injection 5 mg (has no administration in time range)  melatonin tablet 3 mg (has no administration in time range)  heparin ADULT infusion 100 units/mL (25000 units/287m) (950 Units/hr Intravenous Rate/Dose Change 07/28/22 1024)  heparin bolus via infusion 3,000 Units (3,000 Units Intravenous Bolus from Bag 07/27/22 2133)  acetaminophen (TYLENOL) tablet 1,000 mg (1,000 mg Oral Given 07/27/22 2214)  heparin bolus via infusion 750 Units (750 Units Intravenous Bolus from Bag 07/28/22 1022)    Mobility walks with person assist     Focused Assessments Cardiac Assessment Handoff:  Cardiac Rhythm: Atrial fibrillation Lab Results  Component Value Date   CKTOTAL 82 11/14/2011   CKMB 2.5 11/14/2011   TROPONINI <0.03 09/16/2016   No results found for: "DDIMER" Does the Patient currently have chest pain? No   , Pulmonary Assessment Handoff:  Lung sounds: Bilateral Breath Sounds: Inspiratory wheezes O2 Device: Room Air      R Recommendations: See Admitting Provider Note  Report given to:   Additional Notes: Hard of Hearing, Daughter at bedside

## 2022-07-28 NOTE — TOC Initial Note (Signed)
Transition of Care [facility]) - Initial/Assessment Note    Patient Details  Name: [name] MRN: [id number] Date of Birth: [birth year]  Transition of Care [facility]) CM/SW Contact:    Verdell Carmine, RN Phone Number: 07/28/2022, 8:33 AM  Clinical Narrative:                 Transitions of Care (TOC) has reviewed the patient and found no needs at this time. The patient will be discussed at daily progressive rounds by TOC. If a need is identified, please place a TOC consult        Patient Goals and CMS Choice            Expected Discharge Plan and Services                                              Prior Living Arrangements/Services                       Activities of Daily Living      Permission Sought/Granted                  Emotional Assessment              Admission diagnosis:  Atrial fibrillation with rapid ventricular response ([place]) [I48.91] Patient Active Problem List   Diagnosis Date Noted   Atrial fibrillation with rapid ventricular response ([place]) 07/27/2022   Leukocytosis 09/15/2016   Change in bowel habits    History of colon cancer    Atypical chest pain 09/19/2014   Obesity (BMI 30-39.9) 03/14/2014   Medication intolerance - Plavix, Effient, Brilinta    Hyperlipidemia with target LDL less than 70    Essential hypertension    Diverticulosis of colon without hemorrhage 03/16/2013   Incisional hernia, without obstruction or gangrene 11/28/2012   Asthma 10/22/2012   Carcinoma of ascending colon ([place]) 11/16/2011   PERSONAL HX COLONIC POLYPS 11/29/2009   S/P NSTEMI -- involving left circumflex coronary artery (OM2) In-stent Thrombosis 03/02/2001   CAD S/P PCI: OM2 PTCA 7/02, OM2 Stent 9/02, ISR 11/03 - PTCA; RCA DES 1 /14/14 12/31/2000   PCP:  Pa, [place]:   [place], [address] AT [place] [address]  [place] [address] Phone: [phone] Fax: [phone]     Social Determinants of Health (SDOH) Social History: SDOH Screenings   Tobacco Use: Medium Risk (03/22/2022)   SDOH Interventions:     Readmission Risk Interventions     No data to display

## 2022-07-28 NOTE — Discharge Instructions (Signed)
Information on my medicine - ELIQUIS (apixaban)  Why was Eliquis prescribed for you? Eliquis was prescribed for you to reduce the risk of a blood clot forming that can cause a stroke if you have a medical condition called atrial fibrillation (a type of irregular heartbeat).  What do You need to know about Eliquis ? Take your Eliquis TWICE DAILY - one tablet in the morning and one tablet in the evening with or without food. If you have difficulty swallowing the tablet whole please discuss with your pharmacist how to take the medication safely.  Take Eliquis exactly as prescribed by your doctor and DO NOT stop taking Eliquis without talking to the doctor who prescribed the medication.  Stopping may increase your risk of developing a stroke.  Refill your prescription before you run out.  After discharge, you should have regular check-up appointments with your healthcare provider that is prescribing your Eliquis.  In the future your dose may need to be changed if your kidney function or weight changes by a significant amount or as you get older.  What do you do if you miss a dose? If you miss a dose, take it as soon as you remember on the same day and resume taking twice daily.  Do not take more than one dose of ELIQUIS at the same time to make up a missed dose.  Important Safety Information A possible side effect of Eliquis is bleeding. You should call your healthcare provider right away if you experience any of the following: Bleeding from an injury or your nose that does not stop. Unusual colored urine (red or dark brown) or unusual colored stools (red or black). Unusual bruising for unknown reasons. A serious fall or if you hit your head (even if there is no bleeding).  Some medicines may interact with Eliquis and might increase your risk of bleeding or clotting while on Eliquis. To help avoid this, consult your healthcare provider or pharmacist prior to using any new prescription or  non-prescription medications, including herbals, vitamins, non-steroidal anti-inflammatory drugs (NSAIDs) and supplements.  This website has more information on Eliquis (apixaban): http://www.eliquis.com/eliquis/home

## 2022-07-28 NOTE — Consult Note (Addendum)
Cardiology Consultation   Patient ID: [name] MRN: [id number]; DOB: 1927-09-02  Admit date: 07/27/2022 Date of Consult: 07/28/2022  PCP:  Pa, [place] Prov[facility] Cardiologist:  Glenetta Hew, MD  Cardiology APP:  Ledora Bottcher, [place]       Patient Profile:   [name] is a 89 y.o. female with a hx of CAD outlined below, HTN, HLD, mild MR, aortic sclerosis 2014, DJD, asthma, anemia of chronic disease, colon CA, stable lung nodules considered benign, who is being seen 07/28/2022 for the evaluation of atrial fib RVR at the request of Dr. Florene Glen.  CAD/Cardiac History: Former patient of Dr. Terance Ice  a) 7/'02: STEMI, 1st Cutting PTCA- OM2 --> restenosed 9/'02 NSTEMI--> 3.0 mm x 12 mm BMS-OM 2;  b) 11/'03 DOE w/ + Cardiolite - Cutter PTCA for ISR -- patent in 2004 (after False + Cardiolite);  c) 07/2012 - Unstable Angina (dicomfort across the upper chest, feels like 'sizzlng") -- PCI to proximal RCA with Promus Premier DES 2.75 mm x 20 mm (3.0 mm), patent OM2 stent ~10% ISR ([place]) - at that time was cared for by Dr. Rollene Fare  d) 2D Echo 08/2012: EF 55-60%. GR 1 DD. Mild MR  e) Myoview 08/2014: Normal LV size and function. Normal wall motion. LOW RISK.  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  Her hearing aids do not help very much and the batteries are dead.  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  She is able to hear very loud conversation and writing was used to communicate further.  Patient has no awareness of the atrial fibrillation, new diagnosis.  No history of being lightheaded or dizzy, no history of palpitations at all.  She has noticed increasing shortness of breath for about a week.  She has episodes like this because of her asthma, but it got to the point that her inhaler  was not helping.  She went to [facility] and after her ECG was reviewed, she was sent to the hospital.  Currently, her heart rate is controlled on low-dose IV Cardizem.  She is still unaware of the atrial fibrillation, but is breathing better.  She has regular chest pain.  She feels it is related to arthritis in her shoulders, left is worse than the right.  The pain starts in her shoulders and goes across her chest.  It is random, both at rest and with exertion.  She had an episode while we were talking and it started spontaneously.  It was associated with chest wall tenderness.  She initially described the location of the chest pain as epigastric and this area was tender.  Her abdomen is also tender, which she states is unusual for her.   Past Medical History:  Diagnosis Date   Adenomatous colon polyp MAY 2013   HOSPITALIZED AT [facility] WITH HGB 7 -TRANSFUSED-MASS FOUND IN ASCENDING COLON   Aortic sclerosis    With murmur   Asthma    CAD S/P percutaneous coronary angioplasty 2002, 2003,2014   a) Unstable Angina 7/'02: 1st Cutting PTCA- OM2 --> restenosed 9/'02 NSTEMI--> 3.0 mm x 12 mm BMS-OM 2; b) 11/'03 DOE w/ + Cardiolite - Cutter PTCA for ISR -- patent in 2004 (after False + Cardiolite); c) 07/2012 - Unstable Angina -- PCI to proximal  RCA with Promus Premier DES 2.75 mm x 20 mm (3.0 mm), patent OM2 stent ~10% ISR ([place])   Complication of anesthesia 2009   HIP REPLACEMENT-PT HAD HARD TIME WAKING UP--FELT LIKE SHE COULDN'T BREATHE   Coronary stent restenosis due to scar tissue 05/2002   Cutting Balloon PTCA to OM2 BMS   Diverticulosis 10/2012   Seen on Colonoscopy   DJD (degenerative joint disease), thoracolumbar    Back, Hips & Knees   Essential hypertension    H/O Colon cancer 10/2011   GI - Drs. Carlean Purl - colonoscopy 10/2012: diverticulosis, stable ileocolic anastomosis of R colon   Hyperlipidemia with target LDL less than 70    Internal hemorrhoids    Iron deficiency anemia  due to chronic blood loss 10/2011   Blood Transfusion (EGD 09/2012 - unremarkable)   Medication intolerance - Plavix, Effient, Brilinta    Easy bruising, GI bleeds (led to Dx of Colon CA), Brilinta - dyspnea   Multiple lung nodules on CT 08/2012   scattered, bilateral but right>left.    Non-Q wave ST elevation myocardial infarction (STEMI) involving left circumflex coronary artery 03/2001   a) Severe thrombic ISR of prior PTCA site in OM2 --> BMS PCI; b) Echo 08/2012: Normal LV size & function.  EF 55-60%, no regional WMA, Gr 1 DD, mild MR, mild-mod TR - NO Pulmonary HTN   Osteoarthritis (arthritis due to wear and tear of joints)    Bilateral Hip Arthroplasty, L Knee TKA   Osteoporosis     Past Surgical History:  Procedure Laterality Date   APPENDECTOMY     CHOLECYSTECTOMY     COLONOSCOPY  11/16/2011   Procedure: COLONOSCOPY;  Surgeon: Gatha Mayer, MD;  Location: [place];  Service: Endoscopy;  Laterality: N/A;   COLONOSCOPY  10/2012   Carlean Purl: diverticulosis, stable R colon ileocolic anastomosis   COLONOSCOPY W/ BIOPSIES AND POLYPECTOMY  06/15/2008   adenomatous polyps, diverticulosis, internal hemorrhoids   COLONOSCOPY WITH PROPOFOL N/A 07/18/2015   Procedure: COLONOSCOPY WITH PROPOFOL;  Surgeon: Gatha Mayer, MD;  Location: [facility];  Service: Endoscopy;  Laterality: N/A;   CORONARY ANGIOPLASTY WITH STENT PLACEMENT  2002; 2003; 07/2012   a)  7/'02: 1st Cutting PTCA- OM2 --> restenosed 9/'02 --> 3.0 mm x 12 mm BMS-OM 2; b) 11/'03 - Cutter PTCA for ISR -- patent in 2004;    [place] N/A 03/21/2022   Procedure: DILATATION & CURETTAGE/HYSTEROSCOPY WITH MYOSURE, PAP SMEAR;  Surgeon: Drema Dallas, DO;  Location: [place];  Service: Gynecology;  Laterality: N/A;   ESOPHAGOGASTRODUODENOSCOPY N/A 10/23/2012   Procedure: ESOPHAGOGASTRODUODENOSCOPY (EGD);  Surgeon: Inda Castle, MD;  Location: [place];  Service: Endoscopy;  Laterality: N/A;    INCISIONAL HERNIA REPAIR N/A 01/29/2013   Procedure: LAPAROSCOPIC INCISIONAL HERNIA;  Surgeon: Harl Bowie, MD;  Location: [facility];  Service: General;  Laterality: N/A;   INSERTION OF MESH N/A 01/29/2013   Procedure: INSERTION OF MESH;  Surgeon: Harl Bowie, MD;  Location: [facility];  Service: General;  Laterality: N/A;   LAPAROSCOPY  03/21/2022   Procedure: DIAGNOSTIC LAPAROSCOPY;  Surgeon: Drema Dallas, DO;  Location: [place];  Service: Gynecology;;   LEFT HEART CATHETERIZATION WITH CORONARY ANGIOGRAM N/A 07/15/2012   Procedure: LEFT HEART CATHETERIZATION WITH CORONARY ANGIOGRAM;  Surgeon: Leonie Man, MD;  Location[facility] CATH LAB: pRCA 80-90%, OM2 BMS ~10% ISR   NM MYOVIEW LTD  08/2014    Normal LV size and function. Normal wall motion.  LOW RISK.   PERCUTANEOUS CORONARY STENT INTERVENTION (PCI-S)  07/15/2012   Procedure: PERCUTANEOUS CORONARY STENT INTERVENTION (PCI-S);  Surgeon: Leonie Man, MD;  Location: [facility] CATH LAB: c) PCI to proximal RCA with Promus Premier DES 2.75 mm x 20 mm (3.0 mm)   REPLACEMENT TOTAL KNEE Left 2003   TOTAL HIP ARTHROPLASTY  09/08/2007   left, Dr. Wynelle Link   TOTAL HIP ARTHROPLASTY Right 2003   TRANSTHORACIC ECHOCARDIOGRAM  08/2012   Normal LV size & function.  EF 55-60%, no regional WMA, Gr 1 DD, mild MR, mild-mod TR; aortic sclerosis without stenosis     Home Medications:  Prior to Admission medications   Medication Sig Start Date End Date Taking? Authorizing Provider  acetaminophen (TYLENOL) 500 MG tablet Take 500 mg by mouth every 8 (eight) hours as needed for moderate pain.    [provider]  albuterol (PROVENTIL HFA;VENTOLIN HFA) 108 (90 BASE) MCG/ACT inhaler Inhale 2 puffs into the lungs every 6 (six) hours as needed for wheezing.    [provider]  aspirin EC 81 MG tablet Take 81 mg by mouth 3 (three) times a week. Swallow whole.    [provider]  CALCIUM-MAGNESIUM-ZINC PO Take 0.5 tablets by mouth daily.     [provider]  fluticasone (FLONASE) 50 MCG/ACT nasal spray Place 1 spray into the nose daily as needed for allergies.    [provider]  loratadine (CLARITIN) 10 MG tablet Take 10 mg by mouth daily as needed for allergies.    [provider]  melatonin 5 MG TABS Take 5 mg by mouth at bedtime as needed (sleep).    [provider]  metoprolol tartrate (LOPRESSOR) 25 MG tablet Take 12.5 mg by mouth 2 (two) times daily.    [provider]  nitroGLYCERIN (NITROSTAT) 0.4 MG SL tablet DISSOLVE 1 TABLET UNDER THE TONGUE EVERY 5 MINUTES AS NEEDED FOR CHEST PAIN Patient not taking: Reported on 03/19/2022 10/01/16   Leonie Man, MD  triamterene-hydrochlorothiazide (DYAZIDE) 37.5-25 MG capsule Take 1 capsule daily by mouth.    [provider]    Inpatient Medications: Scheduled Meds:  [START ON 07/30/2022] aspirin EC  81 mg Oral Q M,W,F   metoprolol tartrate  12.5 mg Oral BID   Continuous Infusions:  diltiazem (CARDIZEM) infusion 5 mg/hr (07/27/22 2130)   heparin 950 Units/hr (07/28/22 1024)   PRN Meds: acetaminophen, melatonin, prochlorperazine  Allergies:    Allergies  Allergen Reactions   Atorvastatin Shortness Of Breath   Brilinta [Ticagrelor] Shortness Of Breath    BLEEDING   Clopidogrel Bisulfate Shortness Of Breath    BRUISING   Codeine Nausea And Vomiting   Sulfonamide Derivatives Other (See Comments)    Intestinal upset    Social History:   Social History   Socioeconomic History   Marital status: Married    Spouse name: Not on file   Number of children: 2   Years of education: Not on file   Highest education level: Not on file  Occupational History   Occupation: retired  Tobacco Use   Smoking status: Former    Years: 30.00    Types: Cigarettes    Quit date: 07/02/1982    Years since quitting: 40.0   Smokeless tobacco: Never  Vaping Use   Vaping Use: Never used  Substance and Sexual Activity   Alcohol use:  Yes    Alcohol/week: 0.0 standard drinks of alcohol    Comment: occ wine   Drug use:  No   Sexual activity: Not on file  Other Topics Concern   Not on file  Social History Narrative   Married, Mother of 2 adopted children. She has 3 grandchildren.   She worked as a Psychologist, occupational at [employer].   She quit smoking in 1984, and does not drink alcohol.   Social Determinants of Health   Financial Resource Strain: Not on file  Food Insecurity: Not on file  Transportation Needs: Not on file  Physical Activity: Not on file  Stress: Not on file  Social Connections: Not on file  Intimate Partner Violence: Not on file    Family History:   Family History  Problem Relation Age of Onset   Pancreatic cancer Father    Prostate cancer Father    Skin cancer Father    Heart disease Mother    Hypertension Mother    Pancreatic cancer Brother    Heart disease Sister    Colon cancer Brother        ? Colostomy   Prostate cancer Brother    Colon cancer Sister    Melanoma Brother      ROS:  Please see the history of present illness.  All other ROS reviewed and negative.     Physical Exam/Data:   Vitals:   07/28/22 0333 07/28/22 0514 07/28/22 0823 07/28/22 0919  BP: (!) 142/70 122/60 122/67   Pulse: 83 77 82   Resp: '17 16 19   '$ Temp: 98.8 F (37.1 C) 98.4 F (36.9 C)  98.3 F (36.8 C)  TempSrc: Oral Oral  Oral  SpO2: 97% 98% 97%   Weight:      Height:        Intake/Output Summary (Last 24 hours) at 07/28/2022 1137 Last data filed at 07/28/2022 0645 Gross per 24 hour  Intake 107.06 ml  Output --  Net 107.06 ml      07/27/2022    8:52 PM 03/21/2022    7:16 AM 03/12/2022    2:05 PM  Last 3 Weights  Weight (lbs) 146 lb 142 lb 145 lb 3.2 oz  Weight (kg) 66.225 kg 64.411 kg 65.862 kg     Body mass index is 29.49 kg/m.  General:  Well nourished, well developed, elderly female, in no acute distress HEENT: normal for age Neck: no JVD Vascular: No carotid bruits; Distal  pulses 2+ bilaterally Cardiac:  normal S1, S2; irregular rate and rhythm; soft murmur  Lungs:  clear to auscultation bilaterally, no wheezing, rhonchi or rales  Abd: soft, nontender, no hepatomegaly  Ext: no edema Musculoskeletal:  No deformities, BUE and BLE strength weak but equal Skin: warm and dry  Neuro:  CNs 2-12 intact, no focal abnormalities noted Psych:  Normal affect   EKG:  The EKG was personally reviewed and demonstrates: Atrial fibrillation, heart rate 107, no acute ischemic changes, previous ECGs sinus rhythm Telemetry:  Telemetry was personally reviewed and demonstrates: Atrial fibrillation, heart rate generally controlled  Relevant CV Studies:  ECHO: 08/27/2012 - Left ventricle: The cavity size was mildly reduced. Wall    thickness was normal. Systolic function was normal. The    estimated ejection fraction was in the range of 55% to    60%. Wall motion was normal; there were no regional wall    motion abnormalities. Doppler parameters are consistent    with abnormal left ventricular relaxation (grade 1    diastolic dysfunction).  - Ventricular septum: Thickness was mildly increased.  - Mitral valve: Calcified annulus.  Mild regurgitation. Valve    area by pressure half-time: 2.42cm^2.  - Atrial septum: No defect or patent foramen ovale was    identified.  - Tricuspid valve: Mild-moderate regurgitation.   MYOVIEW: 09/28/2014 Impression Exercise Capacity:  [place] with no exercise. BP Response:  Normal blood pressure response. Clinical Symptoms:  No significant symptoms noted. ECG Impression:  No significant ST segment change suggestive of ischemia. Comparison with Prior Nuclear Study: No images to compare   Overall Impression:  Normal stress nuclear study.  Laboratory Data:  High Sensitivity Troponin:   Recent Labs  Lab 07/27/22 1739 07/27/22 2049  TROPONINIHS 80* 79*     Chemistry Recent Labs  Lab 07/27/22 1739 07/27/22 2049 07/28/22 0500  NA 136   --  138  K 3.9  --  3.7  CL 102  --  105  CO2 21*  --  23  GLUCOSE 129*  --  129*  BUN 17  --  19  CREATININE 1.08*  --  1.10*  CALCIUM 10.2  --  9.5  MG  --  2.0 1.9  GFRNONAA 48*  --  47*  ANIONGAP 13  --  10    Recent Labs  Lab 07/28/22 0500  PROT 6.4*  ALBUMIN 3.7  AST 25  ALT 17  ALKPHOS 49  BILITOT 1.9*   Lipids No results for input(s): "CHOL", "TRIG", "HDL", "LABVLDL", "LDLCALC", "CHOLHDL" in the last 168 hours.  Hematology Recent Labs  Lab 07/27/22 1739 07/28/22 0500  WBC 7.9 8.6  RBC 3.80* 3.52*  HGB 12.1 11.1*  HCT 35.5* 33.0*  MCV 93.4 93.8  MCH 31.8 31.5  MCHC 34.1 33.6  RDW 13.5 13.4  PLT 288 248   Thyroid  Recent Labs  Lab 07/27/22 1739  TSH 1.806    BNPNo results for input(s): "BNP", "PROBNP" in the last 168 hours.  DDimer No results for input(s): "DDIMER" in the last 168 hours.   Radiology/Studies:  DG Chest Port 1 View  Result Date: 07/27/2022 CLINICAL DATA:  AFib EXAM: PORTABLE CHEST 1 VIEW COMPARISON:  Radiograph 07/27/2022 FINDINGS: Stable cardiomediastinal silhouette. Low lung volumes accentuate pulmonary vascularity. Aortic atherosclerotic calcification. Left basilar atelectasis. No pleural effusion or pneumothorax. Elevated right hemidiaphragm. Degenerative arthritis both shoulders. IMPRESSION: No active disease. Electronically Signed   By: Placido Sou M.D.   On: 07/27/2022 21:20     Assessment and Plan:   Atrial fibrillation -Unclear onset -Rate initially elevated, but controlled on Cardizem 5 mg/h -She is on heparin drip, CHA2DS2-VASc is 5 so she will need DOAC -Echo is ordered, follow-up on results -If she does not spontaneously convert, discuss options with MD  2.  Chest pain -- She has chest wall tenderness and symptoms occur with and without exertion -Last cath was 2014 she had DES RCA with other vessels patent, last stress test 2016 and was normal -She does not have exertional chest pain -Initial troponin elevated to  80, MD advise on further testing  3.  Abdominal pain -She says she does not normally have this -Her daughter is not aware of any abdominal issues either -Evaluation per [facility]   Risk Assessment/Risk Scores:      CHA2DS2-VASc Score = 5   This indicates a 7.2% annual risk of stroke. The patient's score is based upon: CHF History: 0 HTN History: 1 Diabetes History: 0 Stroke History: 0 Vascular Disease History: 1 Age Score: 2 Gender Score: 1    For questions or updates, please contact [place] Please  consult [email or web address] for contact info under    Signed, Rosaria Ferries, PA-C  07/28/2022 11:37 AM As above, patient seen and examined.  Briefly she is a 89 year old female with past medical history of coronary artery disease, hypertension, hyperlipidemia, colon cancer, anemia for evaluation of new onset atrial fibrillation.  Patient does have a history of coronary disease with previous PCI.  Last echocardiogram in 2014 showed normal LV function and mild mitral regurgitation.  Patient recently complained of increased dyspnea on exertion.  She states that she has had this intermittently for years due to asthma.  She noticed increasing dyspnea over the last week and went to urgent care.  She was found to be in atrial fibrillation and was sent to the hospital.  Cardiology now asked to evaluate.  Patient does state that she has occasional pain in her shoulders bilaterally that is "related to weather".  She denies palpitations or syncope. Chest x-ray shows no active disease.  Creatinine 1.10, troponin 80 and 79, hemoglobin 11.1 with normal MCV.  TSH 1.806.  Electrocardiogram shows atrial fibrillation with no significant ST changes.  1 new onset atrial fibrillation-patient's heart rate is controlled with IV Cardizem.  Will transition to oral Cardizem.  CHA2DS2-VASc.  CHA2DS2-VASc is 5.  Will discontinue IV heparin and treat with apixaban 5 mg twice daily.  Her dyspnea is intermittent and  chronic for years by her report.  She is not volume overloaded on examination.  Note her TSH is normal.  Will arrange echocardiogram to reassess LV function.  Likely discharge tomorrow morning with outpatient follow-up.  Would likely plan to cardiovert when she has been fully anticoagulated for 21 consecutive days.  If she does not hold sinus rhythm then best option likely rate control and anticoagulation long-term as I am not convinced that she has symptoms related to atrial fibrillation.  Note she apparently has had some difficulties with vaginal bleeding in the past.  Will need to follow closely with addition of apixaban.  2 minimally elevated troponin-no clear trend and not consistent with acute coronary syndrome.  3 coronary artery disease-given addition of apixaban will discontinue aspirin.  Patient is not on a statin as she is intolerant.  4 hypertension-given addition of Cardizem we will discontinue metoprolol.  Will treat with 60 mg every 8 hours and transition to Cardizem CD 180 mg daily tomorrow if she tolerates.  Follow blood pressure and adjust medications as needed.  Kirk Ruths, MD

## 2022-07-28 NOTE — Progress Notes (Signed)
PROGRESS NOTE    [name]  GDJ:[id number] DOB: 01/28/1928 DOA: 07/27/2022 PCP: [facility]  Chief Complaint  Patient presents with   Atrial Fibrillation    Brief Narrative:  [name] is [name] 89 y.o. female with medical history significant for colon cancer, arthritis, anemia of chronic disease, coronary artery disease status post PCI with stent placement on aspirin, hyperlipidemia, hypertension, chronic diastolic CHF, mild mitral regurgitation, chronic left shoulder pain, who initially presented to [facility] urgent care with complaints of shortness of breath.  While at urgent care she was noted to be in [name]-fib with RVR.  This is new onset.  Calculated CHA2DS2-VASc score is 5.  Denies any anginal symptoms or cough.  States she uses alcohol modestly.  EMS was activated.  She was advised to go to the ED for further evaluation and management.   In the ED, she received Cardizem bolus and was started on Cardizem drip.  She was also started on heparin drip for primary CVA prevention.  TRH, hospitalist service, was asked to admit for new onset atrial fibrillation with RVR.   Assessment & Plan:   Principal Problem:   Atrial fibrillation with rapid ventricular response (HCC)  New onset atrial fibrillation with rapid ventricular response CHA2DS2-VASc score 5 Normal TSH Echo pending Continue dilt.  Eliquis. Cardiology c/s, appreciate recs  Elevated troponin, suspect demand ischemia in the setting of [name]-fib with RVR. Troponins mildly elevated, but flat Echo pending Appreciate cards recs   History of colon cancer History of postmenopausal bleeding Follow outpatient Monitor with anticoagulation   Coronary artery disease status post PCI with stenting on aspirin Planning to stop aspirin with addition of eliquis   CKD 3B Creatinine appears around baseline  Mildly Elevated Bilirubin Trend  Mild non anion gap metabolic acidosis resolved   Chronic left shoulder  pain As needed analgesics   Hearing impairment, wears hearing aids Extremely hard of hearing      DVT prophylaxis: eliquis Code Status: full Family Communication: none Disposition:   Status is: Inpatient Remains inpatient appropriate because: continued cards eval   Consultants:  cardiology  Procedures:  Echo pending  Antimicrobials:  Anti-infectives (From admission, onward)    None       Subjective: Interview limited with hearing issues C/o intermittent sob/cp  Objective: Vitals:   07/28/22 0514 07/28/22 0823 07/28/22 0919 07/28/22 1214  BP: 122/60 122/67  (!) 119/59  Pulse: 77 82  78  Resp: '16 19  19  '$ Temp: 98.4 F (36.9 C)  98.3 F (36.8 C) (!) 97.1 F (36.2 C)  TempSrc: Oral  Oral Oral  SpO2: 98% 97%  100%  Weight:    65.7 kg  Height:    '4\' 11"'$  (1.499 m)    Intake/Output Summary (Last 24 hours) at 07/28/2022 1416 Last data filed at 07/28/2022 0645 Gross per 24 hour  Intake 107.06 ml  Output --  Net 107.06 ml   Filed Weights   07/27/22 2052 07/28/22 1214  Weight: 66.2 kg 65.7 kg    Examination:  General exam: Appears calm and comfortable  Respiratory system: Clear to auscultation.  Cardiovascular system: irregularly irregular Gastrointestinal system: Abdomen is nondistended, soft and nontender.  Central nervous system: Alert and oriented. Extremely hard of hearing. Extremities: no LEE    Data Reviewed: I have personally reviewed following labs and imaging studies  CBC: Recent Labs  Lab 07/27/22 1739 07/28/22 0500  WBC 7.9 8.6  HGB 12.1 11.1*  HCT 35.5* 33.0*  MCV 93.4 93.8  PLT 288 626    Basic Metabolic Panel: Recent Labs  Lab 07/27/22 1739 07/27/22 2049 07/28/22 0500  NA 136  --  138  K 3.9  --  3.7  CL 102  --  105  CO2 21*  --  23  GLUCOSE 129*  --  129*  BUN 17  --  19  CREATININE 1.08*  --  1.10*  CALCIUM 10.2  --  9.5  MG  --  2.0 1.9  PHOS  --   --  3.5    GFR: Estimated Creatinine Clearance: 26.9  mL/min ([name]) (by C-G formula based on SCr of 1.1 mg/dL (H)).  Liver Function Tests: Recent Labs  Lab 07/28/22 0500  AST 25  ALT 17  ALKPHOS 49  BILITOT 1.9*  PROT 6.4*  ALBUMIN 3.7    CBG: No results for input(s): "GLUCAP" in the last 168 hours.   Recent Results (from the past 240 hour(s))  Resp panel by RT-PCR (RSV, Flu [name]&B, Covid) Anterior Nasal Swab     Status: None   Collection Time: 07/27/22  8:55 PM   Specimen: Anterior Nasal Swab  Result Value Ref Range Status   SARS Coronavirus 2 by RT PCR Mcgrath Mcgrath Final    Comment: (NOTE) SARS-CoV-2 target nucleic acids are NOT DETECTED.  The SARS-CoV-2 RNA is generally detectable in upper respiratory specimens during the acute phase of infection. The lowest concentration of SARS-CoV-2 viral copies this assay can detect is 138 copies/mL. [name] result does not preclude SARS-Cov-2 infection and should not be used as the sole basis for treatment or other patient management decisions. [name] Mcgrath result may occur with  improper specimen collection/handling, submission of specimen other than nasopharyngeal swab, presence of viral mutation(s) within the areas targeted by this assay, and inadequate number of viral copies(<138 copies/mL). [name] Mcgrath result must be combined with clinical observations, patient history, and epidemiological information. The expected result is Mcgrath.  Fact Sheet for Patients:  EntrepreneurPulse.com.au  Fact Sheet for Healthcare Providers:  IncredibleEmployment.be  This test is no t yet approved or cleared by the [place] FDA and  has been authorized for detection and/or diagnosis of SARS-CoV-2 by FDA under an Emergency Use Authorization (EUA). This EUA will remain  in effect (meaning this test can be used) for the duration of the COVID-19 declaration under Section 564(b)(1) of the Act, 21 U.S.C.section 360bbb-3(b)(1), unless the authorization is  terminated  or revoked sooner.       Influenza [name] by PCR Mcgrath Mcgrath Final   Influenza B by PCR Mcgrath Mcgrath Final    Comment: (NOTE) The Xpert Xpress SARS-CoV-2/FLU/RSV plus assay is intended as an aid in the diagnosis of influenza from Nasopharyngeal swab specimens and should not be used as [place] sole basis for treatment. Nasal washings and aspirates are unacceptable for Xpert Xpress SARS-CoV-2/FLU/RSV testing.  Fact Sheet for Patients: EntrepreneurPulse.com.au  Fact Sheet for Healthcare Providers: IncredibleEmployment.be  This test is not yet approved or cleared by the [place] FDA and has been authorized for detection and/or diagnosis of SARS-CoV-2 by FDA under an Emergency Use Authorization (EUA). This EUA will remain in effect (meaning this test can be used) for the duration of the COVID-19 declaration under Section 564(b)(1) of the Act, 21 U.S.C. section 360bbb-3(b)(1), unless the authorization is terminated or revoked.     Resp Syncytial Virus by PCR Mcgrath Mcgrath Final    Comment: (NOTE) Fact Sheet for Patients: EntrepreneurPulse.com.au  Fact Sheet  for Healthcare Providers: IncredibleEmployment.be  This test is not yet approved or cleared by the [place] and has been authorized for detection and/or diagnosis of SARS-CoV-2 by FDA under an Emergency Use Authorization (EUA). This EUA will remain in effect (meaning this test can be used) for the duration of the COVID-19 declaration under Section 564(b)(1) of the Act, 21 U.S.C. section 360bbb-3(b)(1), unless the authorization is terminated or revoked.  Performed at [facility], [place] [address], [place], [place] [address]          Radiology Studies: DG Chest Port 1 View  Result Date: 07/27/2022 CLINICAL DATA:  AFib EXAM: PORTABLE CHEST 1 VIEW COMPARISON:  Radiograph 07/27/2022 FINDINGS: Stable  cardiomediastinal silhouette. Low lung volumes accentuate pulmonary vascularity. Aortic atherosclerotic calcification. Left basilar atelectasis. No pleural effusion or pneumothorax. Elevated right hemidiaphragm. Degenerative arthritis both shoulders. IMPRESSION: No active disease. Electronically Signed   By: Placido Sou M.D.   On: 07/27/2022 21:20        Scheduled Meds:  apixaban  5 mg Oral BID   diltiazem  60 mg Oral Q8H   Continuous Infusions:   LOS: 1 day    Time spent: over 30 min    Fayrene Helper, MD [facility]   To contact the attending provider between 7A-7P or the covering provider during after hours 7P-7A, please log into the web site [email or web address] and access using universal [place] password for that web site. If you do not have the password, please call the hospital operator.  07/28/2022, 2:16 PM

## 2022-07-28 NOTE — Progress Notes (Signed)
ANTICOAGULATION CONSULT NOTE - Follow-up Note  Pharmacy Consult for Heparin Indication: atrial fibrillation  Allergies  Allergen Reactions   Atorvastatin Shortness Of Breath   Brilinta [Ticagrelor] Shortness Of Breath    BLEEDING   Clopidogrel Bisulfate Shortness Of Breath    BRUISING   Codeine Nausea And Vomiting   Sulfonamide Derivatives Other (See Comments)    Intestinal upset    Patient Measurements: Height: '4\' 11"'$  (149.9 cm) Weight: 66.2 kg (146 lb) IBW/kg (Calculated) : 43.2 Heparin Dosing Weight: 57.7 kg  Vital Signs: Temp: 98.3 F (36.8 C) (01/27 0919) Temp Source: Oral (01/27 0919) BP: 122/67 (01/27 0823) Pulse Rate: 82 (01/27 0823)  Labs: Recent Labs    07/27/22 1739 07/27/22 2049 07/28/22 0500 07/28/22 0728  HGB 12.1  --  11.1*  --   HCT 35.5*  --  33.0*  --   PLT 288  --  248  --   HEPARINUNFRC  --   --  >1.10* 0.29*  CREATININE 1.08*  --  1.10*  --   TROPONINIHS 80* 79*  --   --     Estimated Creatinine Clearance: 27 mL/min (A) (by C-G formula based on SCr of 1.1 mg/dL (H)).   Medical History: Past Medical History:  Diagnosis Date   Adenomatous colon polyp MAY 2013   HOSPITALIZED AT [facility] WITH HGB 7 -TRANSFUSED-MASS FOUND IN ASCENDING COLON   Aortic sclerosis    With murmur   Asthma    CAD S/P percutaneous coronary angioplasty 2002, 2003,2014   a) Unstable Angina 7/'02: 1st Cutting PTCA- OM2 --> restenosed 9/'02 NSTEMI--> 3.0 mm x 12 mm BMS-OM 2; b) 11/'03 DOE w/ + Cardiolite - Cutter PTCA for ISR -- patent in 2004 (after False + Cardiolite); c) 07/2012 - Unstable Angina -- PCI to proximal RCA with Promus Premier DES 2.75 mm x 20 mm (3.0 mm), patent OM2 stent ~10% ISR (HARDING)   Complication of anesthesia 2009   HIP REPLACEMENT-PT HAD HARD TIME WAKING UP--FELT LIKE SHE COULDN'T BREATHE   Coronary stent restenosis due to scar tissue 05/2002   Cutting Balloon PTCA to OM2 BMS   Diverticulosis 10/2012   Seen on Colonoscopy   DJD (degenerative  joint disease), thoracolumbar    Back, Hips & Knees   Essential hypertension    H/O Colon cancer 10/2011   GI - Drs. Carlean Purl - colonoscopy 10/2012: diverticulosis, stable ileocolic anastomosis of R colon   Hyperlipidemia with target LDL less than 70    Internal hemorrhoids    Iron deficiency anemia due to chronic blood loss 10/2011   Blood Transfusion (EGD 09/2012 - unremarkable)   Medication intolerance - Plavix, Effient, Brilinta    Easy bruising, GI bleeds (led to Dx of Colon CA), Brilinta - dyspnea   Multiple lung nodules on CT 08/2012   scattered, bilateral but right>left.    Non-Q wave ST elevation myocardial infarction (STEMI) involving left circumflex coronary artery 03/2001   a) Severe thrombic ISR of prior PTCA site in OM2 --> BMS PCI; b) Echo 08/2012: Normal LV size & function.  EF 55-60%, no regional WMA, Gr 1 DD, mild MR, mild-mod TR - NO Pulmonary HTN   Osteoarthritis (arthritis due to wear and tear of joints)    Bilateral Hip Arthroplasty, L Knee TKA   Osteoporosis     Medications:  (Not in a hospital admission)  Scheduled:   [START ON 07/30/2022] aspirin EC  81 mg Oral Q M,W,F   metoprolol tartrate  12.5 mg Oral BID  Infusions:   diltiazem (CARDIZEM) infusion 5 mg/hr (07/27/22 2130)   heparin 850 Units/hr (07/28/22 0823)   PRN: acetaminophen, melatonin, prochlorperazine  Assessment: 83 yof with a history of colon cancer, arthritis, anemia, CAD s/p PCI, HLD, HTN, HF, mild mitral regurgitation, chronic left shoulder pain. Patient is presenting from [place] clinic after eval for SOB and found to be in Afib w/ RVR. Pt is not taking any oral anticoagulation prior to admission. Diltiazem infusion started for rate control of Afib in the ED. Pharmacy consulted to dose heparin infusion for Afib.   Patient was not on anticoagulation prior to arrival. Started on 850 units/hr IV heparin following 3000 unit IV heparin bolus. Resulting heparin level is 0.29 which is  sub-therapeutic. Previous level of >1.1 is false as level drawn from same arm as infusion.  No issues with infusion or bleeding per RN.  Hgb 12.1; plt 288  Goal of Therapy:  Heparin level 0.3-0.7 units/ml Monitor platelets by anticoagulation protocol: Yes   Plan:  Give IV heparin 750 units bolus x 1 Increase heparin infusion to 950 units/hr Check anti-Xa level at 1800 and daily while on heparin Continue to monitor H&H and platelets  Lorelei Pont, PharmD, BCPS 07/28/2022 9:54 AM ED Clinical Pharmacist -  [phone]

## 2022-07-29 ENCOUNTER — Inpatient Hospital Stay (HOSPITAL_COMMUNITY): Payer: Medicare Other

## 2022-07-29 DIAGNOSIS — I4819 Other persistent atrial fibrillation: Secondary | ICD-10-CM | POA: Diagnosis not present

## 2022-07-29 DIAGNOSIS — I4891 Unspecified atrial fibrillation: Secondary | ICD-10-CM | POA: Diagnosis not present

## 2022-07-29 LAB — CBC
HCT: 34.4 % — ABNORMAL LOW (ref 36.0–46.0)
Hemoglobin: 11.4 g/dL — ABNORMAL LOW (ref 12.0–15.0)
MCH: 31.1 pg (ref 26.0–34.0)
MCHC: 33.1 g/dL (ref 30.0–36.0)
MCV: 94 fL (ref 80.0–100.0)
Platelets: 302 10*3/uL (ref 150–400)
RBC: 3.66 MIL/uL — ABNORMAL LOW (ref 3.87–5.11)
RDW: 13.3 % (ref 11.5–15.5)
WBC: 9.4 10*3/uL (ref 4.0–10.5)
nRBC: 0 % (ref 0.0–0.2)

## 2022-07-29 LAB — ECHOCARDIOGRAM COMPLETE
Area-P 1/2: 3.42 cm2
Calc EF: 67.4 %
Height: 59 in
S' Lateral: 1.9 cm
Single Plane A2C EF: 64.8 %
Single Plane A4C EF: 70.5 %
Weight: 2316.8 oz

## 2022-07-29 MED ORDER — APIXABAN 5 MG PO TABS: 5.0000 mg | ORAL_TABLET | Freq: Two times a day (BID) | ORAL | 1 refills | Status: DC

## 2022-07-29 MED ORDER — DILTIAZEM HCL ER COATED BEADS 240 MG PO CP24: 240.0000 mg | ORAL_CAPSULE | Freq: Every day | ORAL | 1 refills | Status: DC

## 2022-07-29 MED ORDER — LIDOCAINE 5 % EX PTCH
1.0000 | MEDICATED_PATCH | CUTANEOUS | Status: DC
  Administered 2022-07-29: 1 via TRANSDERMAL
  Filled 2022-07-29: qty 1

## 2022-07-29 MED ORDER — DILTIAZEM HCL ER COATED BEADS 240 MG PO CP24
240.0000 mg | ORAL_CAPSULE | Freq: Every day | ORAL | Status: DC
  Administered 2022-07-29: 240 mg via ORAL
  Filled 2022-07-29: qty 1

## 2022-07-29 NOTE — Discharge Summary (Signed)
Physician Discharge Summary  [name] SHF:[id number] DOB: [birth year] DOA: 07/27/2022  PCP: [facility]  Admit date: 07/27/2022 Discharge date: 07/29/2022  Time spent: 40 minutes  Recommendations for Outpatient Follow-up:  Follow outpatient CBC/CMP  Follow with cardiology outpatient   Discharge Diagnoses:  Principal Problem:   Atrial fibrillation with rapid ventricular response [name])   Discharge Condition: stable  Diet recommendation: heart healthy  Filed Weights   07/27/22 2052 07/28/22 1214  Weight: 66.2 kg 65.7 kg    History of present illness:  [name] is [name] 89 y.o. female with medical history significant for colon cancer, arthritis, anemia of chronic disease, coronary artery disease status post PCI with stent placement on aspirin, hyperlipidemia, hypertension, chronic diastolic CHF, mild mitral regurgitation, chronic left shoulder pain, who initially presented to [facility] with complaints of shortness of breath.  While at urgent care she was noted to be in [name] with RVR.  This is new onset.  Calculated CHA2DS2-VASc score is 5.  Denies any anginal symptoms or cough.  States she uses alcohol modestly.  EMS was activated.  She was advised to go to the ED for further evaluation and management.   In the ED, she received Cardizem bolus and was started on Cardizem drip.  She was also started on heparin drip for primary CVA prevention.  TRH, hospitalist service, was asked to admit for new onset atrial fibrillation with RVR.  She was admitted for new onset atrial fibrillation.  Started on eliquis and diltiazem.  Rate improved on the day of discharge.  Hospitalization c/b mild delirium which has improved by the time of discharge.  See below for additional details  [facility] Course:  Assessment and Plan: New onset atrial fibrillation with rapid ventricular response CHA2DS2-VASc score 5 Normal TSH Echo with EF 60-65%, no RWMA Continue dilt.   Eliquis. Cardiology c/s, appreciate recs - planning to discharge on diltiazem and eliquis, follow outpatient    Elevated troponin, suspect demand ischemia in the setting of [name] with RVR. Troponins mildly elevated, but flat Echo without RWMA, EF 60-65%, grade II diastolic dysfunction Appreciate cards recs   Delirium  Mild noted this AM Mcgrath related, she's improved today  History of colon cancer History of postmenopausal bleeding Follow outpatient Monitor with anticoagulation   Coronary artery disease status post PCI with stenting on aspirin Planning to stop aspirin with addition of eliquis   CKD 3B Creatinine appears around baseline   Mildly Elevated Bilirubin Trend, follow outpatient    Mild non anion gap metabolic acidosis resolved   Chronic left shoulder pain As needed analgesics   Hearing impairment, wears hearing aids Extremely hard of hearing    Procedures: Echo IMPRESSIONS     1. Left ventricular ejection fraction, by estimation, is 60 to 65%. The  left ventricle has normal function. The left ventricle has no regional  wall motion abnormalities. Left ventricular diastolic parameters are  consistent with Grade II diastolic  dysfunction (pseudonormalization). Elevated left atrial pressure.   2. Right ventricular systolic function is normal. The right ventricular  size is normal.   3. The mitral valve is normal in structure. Moderate mitral valve  regurgitation. No evidence of mitral stenosis. Moderate mitral annular  calcification.   4. The aortic valve is tricuspid. There is mild thickening of the aortic  valve. Aortic valve regurgitation is not visualized. Aortic valve  sclerosis is present, with no evidence of aortic valve stenosis.   5. The inferior vena cava  is normal in size with greater than 50%  respiratory variability, suggesting right atrial pressure of 3 mmHg.   Comparison(s): During the study the rhythm is not atrial fibrillation, but   sinus rhythm with very frequent PACs and frequent brief episodes of atrial  tachycardia.    Consultations: cardiology  Discharge Exam: Vitals:   07/29/22 0939 07/29/22 1500  BP: (!) 145/96 (!) 141/77  Pulse:    Resp: 18 15  Temp:  98.2 F (36.8 C)  SpO2:  95%   Daughter at bedside Concern for agitation this AM, resolved by the time I saw her No complaints other than chronic L shoulder pain this AM  General: No acute distress. Cardiovascular: irregularly irregular, rate in 80-90's when I reviewed tele prior to discharge Lungs: Clear to auscultation bilaterally Abdomen: Soft, nontender, nondistended with normal active bowel sounds. No masses. No hepatosplenomegaly. Neurological: Alert. Moves all extremities 4. Cranial nerves II through XII grossly intact. Extremities: No clubbing or cyanosis. No edema.   Discharge Instructions   Discharge Instructions     Amb Referral to AFIB Clinic   Complete by: As directed    Call MD for:  difficulty breathing, headache or visual disturbances   Complete by: As directed    Call MD for:  extreme fatigue   Complete by: As directed    Call MD for:  hives   Complete by: As directed    Call MD for:  persistant dizziness or light-headedness   Complete by: As directed    Call MD for:  persistant nausea and vomiting   Complete by: As directed    Call MD for:  redness, tenderness, or signs of infection (pain, swelling, redness, odor or green/yellow discharge around incision site)   Complete by: As directed    Call MD for:  severe uncontrolled pain   Complete by: As directed    Call MD for:  temperature >100.4   Complete by: As directed    Diet - low sodium heart healthy   Complete by: As directed    Discharge instructions   Complete by: As directed    You were seen for new onset atrial fibrillation.  Your ultrasound of your heart shows Kennis Buell normal pump or squeeze, but does show some stiffness.    We've started you on eliquis for your  atrial fibrillation.  This decreases the risk of stroke.  We've also started you on diltiazem to help keep your heart rate under control.  You should follow up with the cardiologists as an outpatient for further adjustment of your medicines.  Watch for signs of bleeding on this new medicine.  Follow up with your PCP outpatient.  Stop your aspirin.  Return for new, recurrent, or worsening symptoms.  Please ask your PCP to request records from this hospitalization so they know what was done and what the next steps will be.   Increase activity slowly   Complete by: As directed       Allergies as of 07/29/2022       Reactions   Atorvastatin Shortness Of Breath   Brilinta [ticagrelor] Shortness Of Breath   BLEEDING   Clopidogrel Bisulfate Shortness Of Breath   BRUISING   Codeine Nausea And Vomiting   Sulfonamide Derivatives Other (See Comments)   Intestinal upset        Medication List     STOP taking these medications    aspirin EC 81 MG tablet   metoprolol tartrate 25 MG tablet Commonly known as:  LOPRESSOR   triamterene-hydrochlorothiazide 37.5-25 MG capsule Commonly known as: DYAZIDE       TAKE these medications    acetaminophen 500 MG tablet Commonly known as: TYLENOL Take 500 mg by mouth every 8 (eight) hours as needed for moderate pain.   albuterol 108 (90 Base) MCG/ACT inhaler Commonly known as: VENTOLIN HFA Inhale 2 puffs into the lungs every 6 (six) hours as needed for wheezing.   apixaban 5 MG Tabs tablet Commonly known as: ELIQUIS Take 1 tablet (5 mg total) by mouth 2 (two) times daily.   CALCIUM-MAGNESIUM-ZINC PO Take 0.5 tablets by mouth daily.   diltiazem 240 MG 24 hr capsule Commonly known as: CARDIZEM CD Take 1 capsule (240 mg total) by mouth daily. Start taking on: July 30, 2022   fluticasone 50 MCG/ACT nasal spray Commonly known as: FLONASE Place 1 spray into the nose daily as needed for allergies.   loratadine 10 MG tablet Commonly  known as: CLARITIN Take 10 mg by mouth daily as needed for allergies.   melatonin 5 MG Tabs Take 5 mg by mouth at bedtime as needed (sleep).   nitroGLYCERIN 0.4 MG SL tablet Commonly known as: NITROSTAT DISSOLVE 1 TABLET UNDER THE TONGUE EVERY 5 MINUTES AS NEEDED FOR CHEST PAIN What changed: See the new instructions.       Allergies  Allergen Reactions   Atorvastatin Shortness Of Breath   Brilinta [Ticagrelor] Shortness Of Breath    BLEEDING   Clopidogrel Bisulfate Shortness Of Breath    BRUISING   Codeine Nausea And Vomiting   Sulfonamide Derivatives Other (See Comments)    Intestinal upset      The results of significant diagnostics from this hospitalization (including imaging, microbiology, ancillary and laboratory) are listed below for reference.    Significant Diagnostic Studies: ECHOCARDIOGRAM COMPLETE  Result Date: 07/29/2022    ECHOCARDIOGRAM REPORT   Patient Name:   MAKEILA YAMAGUCHI Date of Exam: 07/29/2022 Medical Rec #:  [id number]      Height:       59.0 in Accession #:    [id number]     Weight:       144.8 lb Date of Birth:  1927/12/06      BSA:          1.608 m Patient Age:    26 years       BP:           146/96 mmHg Patient Gender: F              HR:           109 bpm. Exam Location:  Inpatient Procedure: 2D Echo, Cardiac Doppler and Color Doppler Indications:    I48.1 Persistent atrial fibrillation  History:        Patient has prior history of Echocardiogram examinations. CAD;                 Risk Factors:Hypertension and Dyslipidemia.  Sonographer:    Phineas Douglas Referring Phys: [id number] [place]  1. Left ventricular ejection fraction, by estimation, is 60 to 65%. The left ventricle has normal function. The left ventricle has no regional wall motion abnormalities. Left ventricular diastolic parameters are consistent with Grade II diastolic dysfunction (pseudonormalization). Elevated left atrial pressure.  2. Right ventricular systolic function is  normal. The right ventricular size is normal.  3. The mitral valve is normal in structure. Moderate mitral valve regurgitation. No evidence of mitral stenosis. Moderate mitral annular calcification.  4. The aortic valve is tricuspid. There is mild thickening of the aortic valve. Aortic valve regurgitation is not visualized. Aortic valve sclerosis is present, with no evidence of aortic valve stenosis.  5. The inferior vena cava is normal in size with greater than 50% respiratory variability, suggesting right atrial pressure of 3 mmHg. Comparison(s): During the study the rhythm is not atrial fibrillation, but sinus rhythm with very frequent PACs and frequent brief episodes of atrial tachycardia. FINDINGS  Left Ventricle: Left ventricular ejection fraction, by estimation, is 60 to 65%. The left ventricle has normal function. The left ventricle has no regional wall motion abnormalities. The left ventricular internal cavity size was normal in size. There is  borderline concentric left ventricular hypertrophy. Left ventricular diastolic parameters are consistent with Grade II diastolic dysfunction (pseudonormalization). Elevated left atrial pressure. Right Ventricle: The right ventricular size is normal. No increase in right ventricular wall thickness. Right ventricular systolic function is normal. Left Atrium: Left atrial size was normal in size. Right Atrium: Right atrial size was normal in size. Pericardium: There is no evidence of pericardial effusion. Mitral Valve: The mitral valve is normal in structure. Moderate mitral annular calcification. Moderate mitral valve regurgitation, with centrally-directed jet. No evidence of mitral valve stenosis. Tricuspid Valve: The tricuspid valve is normal in structure. Tricuspid valve regurgitation is not demonstrated. No evidence of tricuspid stenosis. Aortic Valve: The aortic valve is tricuspid. There is mild thickening of the aortic valve. Aortic valve regurgitation is not  visualized. Aortic valve sclerosis is present, with no evidence of aortic valve stenosis. Pulmonic Valve: The pulmonic valve was normal in structure. Pulmonic valve regurgitation is mild. No evidence of pulmonic stenosis. Aorta: The aortic root is normal in size and structure. Venous: The inferior vena cava is normal in size with greater than 50% respiratory variability, suggesting right atrial pressure of 3 mmHg. IAS/Shunts: No atrial level shunt detected by color flow Doppler.  LEFT VENTRICLE PLAX 2D LVIDd:         3.50 cm     Diastology LVIDs:         1.90 cm     LV e' medial:    5.77 cm/s LV PW:         1.10 cm     LV E/e' medial:  23.4 LV IVS:        1.10 cm     LV e' lateral:   10.80 cm/s LVOT diam:     1.80 cm     LV E/e' lateral: 12.5 LV SV:         59 LV SV Index:   37 LVOT Area:     2.54 cm  LV Volumes (MOD) LV vol d, MOD A2C: 62.2 ml LV vol d, MOD A4C: 63.4 ml LV vol s, MOD A2C: 21.9 ml LV vol s, MOD A4C: 18.7 ml LV SV MOD A2C:     40.3 ml LV SV MOD A4C:     63.4 ml LV SV MOD BP:      43.2 ml RIGHT VENTRICLE             IVC RV Basal diam:  3.40 cm     IVC diam: 2.00 cm RV S prime:     12.40 cm/s TAPSE (M-mode): 1.5 cm LEFT ATRIUM             Index        RIGHT ATRIUM           Index LA diam:  3.30 cm 2.05 cm/m   RA Area:     11.60 cm LA Vol (A2C):   40.4 ml 25.13 ml/m  RA Volume:   24.60 ml  15.30 ml/m LA Vol (A4C):   38.7 ml 24.07 ml/m LA Biplane Vol: 40.5 ml 25.19 ml/m  AORTIC VALVE LVOT Vmax:   113.00 cm/s LVOT Vmean:  75.900 cm/s LVOT VTI:    0.231 m  AORTA Ao Root diam: 3.20 cm Ao Asc diam:  3.00 cm MITRAL VALVE MV Area (PHT): 3.42 cm     SHUNTS MV Decel Time: 222 msec     Systemic VTI:  0.23 m MV E velocity: 135.00 cm/s  Systemic Diam: 1.80 cm MV Aryon Nham velocity: 108.00 cm/s MV E/Aviela Blundell ratio:  1.25 Mihai Croitoru MD Electronically signed by Sanda Klein MD Signature Date/Time: 07/29/2022/3:04:25 PM    Final    DG Chest Port 1 View  Result Date: 07/27/2022 CLINICAL DATA:  AFib EXAM:  PORTABLE CHEST 1 VIEW COMPARISON:  Radiograph 07/27/2022 FINDINGS: Stable cardiomediastinal silhouette. Low lung volumes accentuate pulmonary vascularity. Aortic atherosclerotic calcification. Left basilar atelectasis. No pleural effusion or pneumothorax. Elevated right hemidiaphragm. Degenerative arthritis both shoulders. IMPRESSION: No active disease. Electronically Signed   By: Placido Sou M.D.   On: 07/27/2022 21:20    Microbiology: Recent Results (from the past 240 hour(s))  Resp panel by RT-PCR (RSV, Flu Magalie Almon&B, Covid) Anterior Nasal Swab     Status: None   Collection Time: 07/27/22  8:55 PM   Specimen: Anterior Nasal Swab  Result Value Ref Range Status   SARS Coronavirus 2 by RT PCR NEGATIVE NEGATIVE Final    Comment: (NOTE) SARS-CoV-2 target nucleic acids are NOT DETECTED.  The SARS-CoV-2 RNA is generally detectable in upper respiratory specimens during the acute phase of infection. The lowest concentration of SARS-CoV-2 viral copies this assay can detect is 138 copies/mL. Rande Dario negative result does not preclude SARS-Cov-2 infection and should not be used as the sole basis for treatment or other patient management decisions. Moyses Pavey negative result may occur with  improper specimen collection/handling, submission of specimen other than nasopharyngeal swab, presence of viral mutation(s) within the areas targeted by this assay, and inadequate number of viral copies(<138 copies/mL). Ahmiyah Coil negative result must be combined with clinical observations, patient history, and epidemiological information. The expected result is Negative.  Fact Sheet for Patients:  EntrepreneurPulse.com.au  Fact Sheet for Healthcare Providers:  IncredibleEmployment.be  This test is no t yet approved or cleared by the [place] FDA and  has been authorized for detection and/or diagnosis of SARS-CoV-2 by FDA under an Emergency Use Authorization (EUA). This EUA will remain  in  effect (meaning this test can be used) for the duration of the COVID-19 declaration under Section 564(b)(1) of the Act, 21 U.S.C.section 360bbb-3(b)(1), unless the authorization is terminated  or revoked sooner.       Influenza Francisco Ostrovsky by PCR NEGATIVE NEGATIVE Final   Influenza B by PCR NEGATIVE NEGATIVE Final    Comment: (NOTE) The Xpert Xpress SARS-CoV-2/FLU/RSV plus assay is intended as an aid in the diagnosis of influenza from Nasopharyngeal swab specimens and should not be used as Kandace Elrod sole basis for treatment. Nasal washings and aspirates are unacceptable for Xpert Xpress SARS-CoV-2/FLU/RSV testing.  Fact Sheet for Patients: EntrepreneurPulse.com.au  Fact Sheet for Healthcare Providers: IncredibleEmployment.be  This test is not yet approved or cleared by the [place] FDA and has been authorized for detection and/or diagnosis of SARS-CoV-2 by FDA under an Emergency Use Authorization (  EUA). This EUA will remain in effect (meaning this test can be used) for the duration of the COVID-19 declaration under Section 564(b)(1) of the Act, 21 U.S.C. section 360bbb-3(b)(1), unless the authorization is terminated or revoked.     Resp Syncytial Virus by PCR NEGATIVE NEGATIVE Final    Comment: (NOTE) Fact Sheet for Patients: EntrepreneurPulse.com.au  Fact Sheet for Healthcare Providers: IncredibleEmployment.be  This test is not yet approved or cleared by the [place] FDA and has been authorized for detection and/or diagnosis of SARS-CoV-2 by FDA under an Emergency Use Authorization (EUA). This EUA will remain in effect (meaning this test can be used) for the duration of the COVID-19 declaration under Section 564(b)(1) of the Act, 21 U.S.C. section 360bbb-3(b)(1), unless the authorization is terminated or revoked.  Performed at [facility], [place] [address] [place], [place] [address]       Labs: Basic Metabolic Panel: Recent Labs  Lab 07/27/22 1739 07/27/22 2049 07/28/22 0500  NA 136  --  138  K 3.9  --  3.7  CL 102  --  105  CO2 21*  --  23  GLUCOSE 129*  --  129*  BUN 17  --  19  CREATININE 1.08*  --  1.10*  CALCIUM 10.2  --  9.5  MG  --  2.0 1.9  PHOS  --   --  3.5   Liver Function Tests: Recent Labs  Lab 07/28/22 0500  AST 25  ALT 17  ALKPHOS 49  BILITOT 1.9*  PROT 6.4*  ALBUMIN 3.7   No results for input(s): "LIPASE", "AMYLASE" in the last 168 hours. No results for input(s): "AMMONIA" in the last 168 hours. CBC: Recent Labs  Lab 07/27/22 1739 07/28/22 0500 07/29/22 0150  WBC 7.9 8.6 9.4  HGB 12.1 11.1* 11.4*  HCT 35.5* 33.0* 34.4*  MCV 93.4 93.8 94.0  PLT 288 248 302   Cardiac Enzymes: No results for input(s): "CKTOTAL", "CKMB", "CKMBINDEX", "TROPONINI" in the last 168 hours. BNP: BNP (last 3 results) No results for input(s): "BNP" in the last 8760 hours.  ProBNP (last 3 results) No results for input(s): "PROBNP" in the last 8760 hours.  CBG: No results for input(s): "GLUCAP" in the last 168 hours.     Signed:  Fayrene Helper MD.  [facility] 07/29/2022, 5:48 PM

## 2022-07-29 NOTE — Progress Notes (Signed)
Rounding Note    Patient Name: [name] Date of Encounter: 07/29/2022  [place] Cardiologist: Glenetta Hew, MD   Subjective   Confused; Denies CP or dyspnea  Inpatient Medications    Scheduled Meds:  apixaban  5 mg Oral BID   diltiazem  60 mg Oral Q8H   Continuous Infusions:  PRN Meds: acetaminophen, melatonin, mouth rinse, prochlorperazine   Vital Signs    Vitals:   07/28/22 1637 07/28/22 2042 07/29/22 0005 07/29/22 0523  BP: 122/64 131/75 (!) 122/58 117/79  Pulse: 65 73 (!) 121   Resp: '20 20 18 20  '$ Temp: 97.9 F (36.6 C) 98.2 F (36.8 C) 98.3 F (36.8 C) 98.4 F (36.9 C)  TempSrc: Oral Oral Oral Oral  SpO2: 96% 96% 97% 96%  Weight:      Height:        Intake/Output Summary (Last 24 hours) at 07/29/2022 0807 Last data filed at 07/28/2022 2055 Gross per 24 hour  Intake 240 ml  Output --  Net 240 ml      07/28/2022   12:14 PM 07/27/2022    8:52 PM 03/21/2022    7:16 AM  Last 3 Weights  Weight (lbs) 144 lb 12.8 oz 146 lb 142 lb  Weight (kg) 65.681 kg 66.225 kg 64.411 kg      Telemetry    Sinus with runs of PAT converting back to atrial fibrillation - Personally Reviewed   Physical Exam   GEN: No acute distress.   Neck: No JVD Cardiac: irregular, tachycardic Respiratory: Clear to auscultation bilaterally. GI: Soft, nontender, non-distended  MS: No edema Neuro:  Nonfocal  Psych: Confused  Labs    High Sensitivity Troponin:   Recent Labs  Lab 07/27/22 1739 07/27/22 2049  TROPONINIHS 80* 79*     Chemistry Recent Labs  Lab 07/27/22 1739 07/27/22 2049 07/28/22 0500  NA 136  --  138  K 3.9  --  3.7  CL 102  --  105  CO2 21*  --  23  GLUCOSE 129*  --  129*  BUN 17  --  19  CREATININE 1.08*  --  1.10*  CALCIUM 10.2  --  9.5  MG  --  2.0 1.9  PROT  --   --  6.4*  ALBUMIN  --   --  3.7  AST  --   --  25  ALT  --   --  17  ALKPHOS  --   --  49  BILITOT  --   --  1.9*  GFRNONAA 48*  --  47*  ANIONGAP 13  --   10     Hematology Recent Labs  Lab 07/27/22 1739 07/28/22 0500 07/29/22 0150  WBC 7.9 8.6 9.4  RBC 3.80* 3.52* 3.66*  HGB 12.1 11.1* 11.4*  HCT 35.5* 33.0* 34.4*  MCV 93.4 93.8 94.0  MCH 31.8 31.5 31.1  MCHC 34.1 33.6 33.1  RDW 13.5 13.4 13.3  PLT 288 248 302   Thyroid  Recent Labs  Lab 07/27/22 1739  TSH 1.806    Radiology    DG Chest Port 1 View  Result Date: 07/27/2022 CLINICAL DATA:  AFib EXAM: PORTABLE CHEST 1 VIEW COMPARISON:  Radiograph 07/27/2022 FINDINGS: Stable cardiomediastinal silhouette. Low lung volumes accentuate pulmonary vascularity. Aortic atherosclerotic calcification. Left basilar atelectasis. No pleural effusion or pneumothorax. Elevated right hemidiaphragm. Degenerative arthritis both shoulders. IMPRESSION: No active disease. Electronically Signed   By: Placido Sou M.D.   On:  07/27/2022 21:20     Patient Profile     89 year old female with past medical history of coronary artery disease, hypertension, hyperlipidemia, colon cancer, anemia for evaluation of new onset atrial fibrillation.  Patient does have a history of coronary disease with previous PCI.  Last echocardiogram in 2014 showed normal LV function and mild mitral regurgitation.   Assessment & Plan    1 new onset atrial fibrillation-patient with newly diagnosed atrial fibrillation.  She was initially treated with Cardizem and converted to sinus rhythm with runs of PAT.  Back in atrial fibrillation this morning with elevated rate.  Change Cardizem to CD2 140 mg daily.  CHA2DS2-VASc is 5.  Continue apixaban.  Await echocardiogram.  Her TSH is normal.  If she does not hold sinus rhythm then best option likely rate control and anticoagulation long-term as I am not convinced that she has symptoms related to atrial fibrillation.  Note she apparently has had some difficulties with vaginal bleeding in the past.  Will need to follow closely with addition of apixaban.   2 minimally elevated troponin-no  clear trend and not consistent with acute coronary syndrome.   3 coronary artery disease-given addition of apixaban will discontinue aspirin.  Patient is not on a statin as she is intolerant.   4 hypertension-plan to change to Cardizem CD 240 mg daily.  Follow blood pressure and adjust as needed.  Note patient is somewhat confused this morning.  For questions or updates, please contact [place] Please consult [email or web address] for contact info under        Signed, Kirk Ruths, MD  07/29/2022, 8:07 AM

## 2022-07-29 NOTE — Evaluation (Signed)
Physical Therapy Evaluation Patient Details Name: [name] MRN: [id number] DOB: 1927/08/23 Today's Date: 07/29/2022  History of Present Illness  Pt is a 89 y.o. F who presents 07/27/2022 for evaluation of new onset fibrillation. Significant PMH: CAD, HTN, HLD, colon CA, anemia.  Clinical Impression  PTA, pt lives alone, uses a Rollator for mobility and is independent with ADL's. Pt family checks in daily and brings meals as needed. Pt carries her phone on her 24/7 and has landlines in every room. Pt appears to be fairly close to her functional baseline and is mobilizing well. Pt ambulating 200 ft with a walker at a supervision level. HR 108-150 afib (RN notified), SpO2 98% on RA, DOE 2/4. Will continue to follow acutely. Don't anticipate need for PT follow up.      Recommendations for follow up therapy are one component of a multi-disciplinary discharge planning process, led by the attending physician.  Recommendations may be updated based on patient status, additional functional criteria and insurance authorization.  Follow Up Recommendations No PT follow up      Assistance Recommended at Discharge Intermittent Supervision/Assistance  Patient can return home with the following  Assistance with cooking/housework;Assist for transportation    Equipment Recommendations None recommended by PT  Recommendations for Other Services       Functional Status Assessment Patient has had a recent decline in their functional status and demonstrates the ability to make significant improvements in function in a reasonable and predictable amount of time.     Precautions / Restrictions Precautions Precautions: Fall;Other (comment) Precaution Comments: watch HR Restrictions Weight Bearing Restrictions: No      Mobility  Bed Mobility               General bed mobility comments: Sitting EOB upon arrival    Transfers Overall transfer level: Modified independent Equipment used: Rolling  walker (2 wheels)                    Ambulation/Gait Ambulation/Gait assistance: Supervision Gait Distance (Feet): 200 Feet Assistive device: Rolling walker (2 wheels) Gait Pattern/deviations: Step-through pattern       General Gait Details: Good cadence, lightly utilizing RW  Stairs            Wheelchair Mobility    Modified Rankin (Stroke Patients Only)       Balance Overall balance assessment: Mild deficits observed, not formally tested                                           Pertinent Vitals/Pain Pain Assessment Pain Assessment: Faces Faces Pain Scale: Hurts a little bit Pain Location: chronic cervical and bilateral shoulders Pain Descriptors / Indicators: Aching Pain Intervention(s): Monitored during session    Home Living Family/patient expects to be discharged to:: Private residence Living Arrangements: Alone Available Help at Discharge: Family;Available PRN/intermittently (daughter/son) Type of Home: House Home Access: Level entry       Home Layout: One level Home Equipment: Advice worker (2 wheels);Rollator (4 wheels) Additional Comments: Daughter works close by and calls 2-3 times/day, pt son lives in [place]    Prior Function Prior Level of Function : Needs assist             Mobility Comments: uses Rollator, keeps cell phone on the seat ADLs Comments: cooks Healthy choice or soup/salads, pt family brings meals intermittently, does  laundry     Hand Dominance        Extremity/Trunk Assessment   Upper Extremity Assessment Upper Extremity Assessment: Generalized weakness;RUE deficits/detail;LUE deficits/detail RUE Deficits / Details: Shoulder flexion AROM WFL, tightness along upper traps LUE Deficits / Details: Shoulder flexion AROM to ~100 degrees, tightness along upper traps    Lower Extremity Assessment Lower Extremity Assessment: Overall WFL for tasks assessed    Cervical / Trunk  Assessment Cervical / Trunk Assessment: Kyphotic (mild)  Communication   Communication: HOH  Cognition Arousal/Alertness: Awake/alert Behavior During Therapy: WFL for tasks assessed/performed Overall Cognitive Status: Within Functional Limits for tasks assessed                                          General Comments      Exercises     Assessment/Plan    PT Assessment Patient needs continued PT services  PT Problem List Decreased activity tolerance;Decreased balance;Decreased mobility;Cardiopulmonary status limiting activity       PT Treatment Interventions DME instruction;Gait training;Functional mobility training;Therapeutic activities;Balance training;Patient/family education    PT Goals (Current goals can be found in the Care Plan section)  Acute Rehab PT Goals Patient Stated Goal: return home PT Goal Formulation: With patient Time For Goal Achievement: 08/12/22 Potential to Achieve Goals: Good    Frequency Min 2X/week     Co-evaluation               AM-PAC PT "6 Clicks" Mobility  Outcome Measure Help needed turning from your back to your side while in a flat bed without using bedrails?: None Help needed moving from lying on your back to sitting on the side of a flat bed without using bedrails?: None Help needed moving to and from a bed to a chair (including a wheelchair)?: None Help needed standing up from a chair using your arms (e.g., wheelchair or bedside chair)?: None Help needed to walk in hospital room?: A Little Help needed climbing 3-5 steps with a railing? : A Little 6 Click Score: 22    End of Session   Activity Tolerance: Patient tolerated treatment well Patient left: in bed;with call bell/phone within reach Nurse Communication: Mobility status;Other (comment) (vitals) PT Visit Diagnosis: Difficulty in walking, not elsewhere classified (R26.2)    Time: 9485-4627 PT Time Calculation (min) (ACUTE ONLY): 22  min   Charges:   PT Evaluation $PT Eval Low Complexity: [place], PT, DPT Acute Rehabilitation Services Office [phone]   Deno Etienne 07/29/2022, 3:31 PM

## 2022-07-29 NOTE — Progress Notes (Signed)
Echocardiogram 2D Echocardiogram has been performed.  [name] 07/29/2022, 1:14 PM

## 2022-07-29 NOTE — Progress Notes (Signed)
Echo was attempted at 8:00 but not performed due to patient's altered mental status. Patient got out of bed and would not lie back down. Patient states "if you don't get out of my way I'm going to punch you." Nurses called for assistance.

## 2022-08-06 ENCOUNTER — Encounter (HOSPITAL_COMMUNITY): Payer: Self-pay | Admitting: Internal Medicine

## 2022-08-06 ENCOUNTER — Inpatient Hospital Stay (HOSPITAL_COMMUNITY)
Admission: EM | Admit: 2022-08-06 | Discharge: 2022-08-08 | DRG: 291 | Disposition: A | Discharge status: Home Health | Payer: Medicare Other | Attending: Internal Medicine | Admitting: Internal Medicine

## 2022-08-06 ENCOUNTER — Other Ambulatory Visit: Payer: Self-pay

## 2022-08-06 ENCOUNTER — Emergency Department (HOSPITAL_COMMUNITY): Payer: Medicare Other

## 2022-08-06 DIAGNOSIS — D72829 Elevated white blood cell count, unspecified: Secondary | ICD-10-CM | POA: Diagnosis not present

## 2022-08-06 DIAGNOSIS — E669 Obesity, unspecified: Secondary | ICD-10-CM | POA: Diagnosis present

## 2022-08-06 DIAGNOSIS — Z1152 Encounter for screening for COVID-19: Secondary | ICD-10-CM | POA: Diagnosis not present

## 2022-08-06 DIAGNOSIS — I10 Essential (primary) hypertension: Secondary | ICD-10-CM | POA: Diagnosis not present

## 2022-08-06 DIAGNOSIS — J810 Acute pulmonary edema: Secondary | ICD-10-CM

## 2022-08-06 DIAGNOSIS — E785 Hyperlipidemia, unspecified: Secondary | ICD-10-CM | POA: Diagnosis not present

## 2022-08-06 DIAGNOSIS — I2510 Atherosclerotic heart disease of native coronary artery without angina pectoris: Secondary | ICD-10-CM | POA: Diagnosis not present

## 2022-08-06 DIAGNOSIS — I110 Hypertensive heart disease with heart failure: Secondary | ICD-10-CM | POA: Diagnosis not present

## 2022-08-06 DIAGNOSIS — Z87891 Personal history of nicotine dependence: Secondary | ICD-10-CM | POA: Diagnosis not present

## 2022-08-06 DIAGNOSIS — Z974 Presence of external hearing-aid: Secondary | ICD-10-CM | POA: Diagnosis not present

## 2022-08-06 DIAGNOSIS — Z7901 Long term (current) use of anticoagulants: Secondary | ICD-10-CM

## 2022-08-06 DIAGNOSIS — R0602 Shortness of breath: Principal | ICD-10-CM

## 2022-08-06 DIAGNOSIS — I4891 Unspecified atrial fibrillation: Secondary | ICD-10-CM | POA: Diagnosis not present

## 2022-08-06 DIAGNOSIS — R062 Wheezing: Secondary | ICD-10-CM | POA: Diagnosis not present

## 2022-08-06 DIAGNOSIS — I5033 Acute on chronic diastolic (congestive) heart failure: Secondary | ICD-10-CM | POA: Diagnosis not present

## 2022-08-06 DIAGNOSIS — Z955 Presence of coronary angioplasty implant and graft: Secondary | ICD-10-CM | POA: Diagnosis not present

## 2022-08-06 DIAGNOSIS — M810 Age-related osteoporosis without current pathological fracture: Secondary | ICD-10-CM | POA: Diagnosis present

## 2022-08-06 DIAGNOSIS — Z9861 Coronary angioplasty status: Secondary | ICD-10-CM

## 2022-08-06 DIAGNOSIS — I509 Heart failure, unspecified: Secondary | ICD-10-CM | POA: Diagnosis not present

## 2022-08-06 DIAGNOSIS — J45901 Unspecified asthma with (acute) exacerbation: Secondary | ICD-10-CM | POA: Diagnosis not present

## 2022-08-06 DIAGNOSIS — Z85038 Personal history of other malignant neoplasm of large intestine: Secondary | ICD-10-CM

## 2022-08-06 DIAGNOSIS — I340 Nonrheumatic mitral (valve) insufficiency: Secondary | ICD-10-CM | POA: Diagnosis present

## 2022-08-06 DIAGNOSIS — Z79899 Other long term (current) drug therapy: Secondary | ICD-10-CM

## 2022-08-06 DIAGNOSIS — Z888 Allergy status to other drugs, medicaments and biological substances status: Secondary | ICD-10-CM

## 2022-08-06 DIAGNOSIS — I252 Old myocardial infarction: Secondary | ICD-10-CM

## 2022-08-06 DIAGNOSIS — H9190 Unspecified hearing loss, unspecified ear: Secondary | ICD-10-CM | POA: Diagnosis not present

## 2022-08-06 DIAGNOSIS — I4819 Other persistent atrial fibrillation: Secondary | ICD-10-CM | POA: Diagnosis not present

## 2022-08-06 DIAGNOSIS — Z8249 Family history of ischemic heart disease and other diseases of the circulatory system: Secondary | ICD-10-CM

## 2022-08-06 DIAGNOSIS — Z96643 Presence of artificial hip joint, bilateral: Secondary | ICD-10-CM | POA: Diagnosis present

## 2022-08-06 DIAGNOSIS — Z9049 Acquired absence of other specified parts of digestive tract: Secondary | ICD-10-CM | POA: Diagnosis not present

## 2022-08-06 DIAGNOSIS — Z6830 Body mass index (BMI) 30.0-30.9, adult: Secondary | ICD-10-CM

## 2022-08-06 DIAGNOSIS — R000 Tachycardia, unspecified: Secondary | ICD-10-CM | POA: Diagnosis not present

## 2022-08-06 DIAGNOSIS — R0902 Hypoxemia: Secondary | ICD-10-CM | POA: Diagnosis not present

## 2022-08-06 DIAGNOSIS — Z885 Allergy status to narcotic agent status: Secondary | ICD-10-CM

## 2022-08-06 DIAGNOSIS — M1990 Unspecified osteoarthritis, unspecified site: Secondary | ICD-10-CM | POA: Diagnosis present

## 2022-08-06 DIAGNOSIS — J9601 Acute respiratory failure with hypoxia: Secondary | ICD-10-CM | POA: Diagnosis present

## 2022-08-06 DIAGNOSIS — R0600 Dyspnea, unspecified: Secondary | ICD-10-CM | POA: Diagnosis not present

## 2022-08-06 DIAGNOSIS — J90 Pleural effusion, not elsewhere classified: Secondary | ICD-10-CM | POA: Diagnosis not present

## 2022-08-06 DIAGNOSIS — Z800 Family history of malignant neoplasm of digestive organs: Secondary | ICD-10-CM

## 2022-08-06 DIAGNOSIS — I214 Non-ST elevation (NSTEMI) myocardial infarction: Secondary | ICD-10-CM | POA: Diagnosis present

## 2022-08-06 DIAGNOSIS — Z96652 Presence of left artificial knee joint: Secondary | ICD-10-CM | POA: Diagnosis present

## 2022-08-06 DIAGNOSIS — Z882 Allergy status to sulfonamides status: Secondary | ICD-10-CM

## 2022-08-06 DIAGNOSIS — Z86010 Personal history of colonic polyps: Secondary | ICD-10-CM

## 2022-08-06 LAB — CBC
HCT: 34.6 % — ABNORMAL LOW (ref 36.0–46.0)
Hemoglobin: 11.4 g/dL — ABNORMAL LOW (ref 12.0–15.0)
MCH: 31.9 pg (ref 26.0–34.0)
MCHC: 32.9 g/dL (ref 30.0–36.0)
MCV: 96.9 fL (ref 80.0–100.0)
Platelets: 269 10*3/uL (ref 150–400)
RBC: 3.57 MIL/uL — ABNORMAL LOW (ref 3.87–5.11)
RDW: 14.6 % (ref 11.5–15.5)
WBC: 10.6 10*3/uL — ABNORMAL HIGH (ref 4.0–10.5)
nRBC: 0 % (ref 0.0–0.2)

## 2022-08-06 LAB — RESPIRATORY PANEL BY PCR

## 2022-08-06 LAB — COMPREHENSIVE METABOLIC PANEL
ALT: 22 U/L (ref 0–44)
AST: 34 U/L (ref 15–41)
Albumin: 3.7 g/dL (ref 3.5–5.0)
Alkaline Phosphatase: 50 U/L (ref 38–126)
Anion gap: 12 (ref 5–15)
BUN: 16 mg/dL (ref 8–23)
CO2: 20 mmol/L — ABNORMAL LOW (ref 22–32)
Calcium: 9.5 mg/dL (ref 8.9–10.3)
Chloride: 107 mmol/L (ref 98–111)
Creatinine, Ser: 0.9 mg/dL (ref 0.44–1.00)
GFR, Estimated: 60 mL/min — ABNORMAL LOW (ref >60)
Glucose, Bld: 206 mg/dL — ABNORMAL HIGH (ref 70–99)
Potassium: 3.5 mmol/L (ref 3.5–5.1)
Sodium: 139 mmol/L (ref 135–145)
Total Bilirubin: 1.8 mg/dL — ABNORMAL HIGH (ref 0.3–1.2)
Total Protein: 7 g/dL (ref 6.5–8.1)

## 2022-08-06 LAB — TROPONIN I (HIGH SENSITIVITY)
Troponin I (High Sensitivity): 43 ng/L — ABNORMAL HIGH (ref <18)
Troponin I (High Sensitivity): 44 ng/L — ABNORMAL HIGH (ref <18)

## 2022-08-06 LAB — RESP PANEL BY RT-PCR (RSV, FLU A&B, COVID)  RVPGX2
Influenza A by PCR: NEGATIVE
Influenza B by PCR: NEGATIVE
Resp Syncytial Virus by PCR: NEGATIVE
SARS Coronavirus 2 by RT PCR: NEGATIVE

## 2022-08-06 LAB — I-STAT VENOUS BLOOD GAS, ED
Acid-base deficit: 2 mmol/L (ref 0.0–2.0)
Bicarbonate: 22.7 mmol/L (ref 20.0–28.0)
Calcium, Ion: 1.22 mmol/L (ref 1.15–1.40)
HCT: 32 % — ABNORMAL LOW (ref 36.0–46.0)
Hemoglobin: 10.9 g/dL — ABNORMAL LOW (ref 12.0–15.0)
O2 Saturation: 70 %
Potassium: 3.5 mmol/L (ref 3.5–5.1)
Sodium: 139 mmol/L (ref 135–145)
TCO2: 24 mmol/L (ref 22–32)
pCO2, Ven: 36.2 mmHg — ABNORMAL LOW (ref 44–60)
pH, Ven: 7.406 (ref 7.25–7.43)
pO2, Ven: 36 mmHg (ref 32–45)

## 2022-08-06 LAB — I-STAT CHEM 8, ED
BUN: 23 mg/dL (ref 8–23)
Calcium, Ion: 1.22 mmol/L (ref 1.15–1.40)
Chloride: 110 mmol/L (ref 98–111)
Creatinine, Ser: 0.8 mg/dL (ref 0.44–1.00)
Glucose, Bld: 198 mg/dL — ABNORMAL HIGH (ref 70–99)
HCT: 33 % — ABNORMAL LOW (ref 36.0–46.0)
Hemoglobin: 11.2 g/dL — ABNORMAL LOW (ref 12.0–15.0)
Potassium: 3.5 mmol/L (ref 3.5–5.1)
Sodium: 140 mmol/L (ref 135–145)
TCO2: 22 mmol/L (ref 22–32)

## 2022-08-06 LAB — MAGNESIUM: Magnesium: 5.2 mg/dL — ABNORMAL HIGH (ref 1.7–2.4)

## 2022-08-06 LAB — BRAIN NATRIURETIC PEPTIDE: B Natriuretic Peptide: 243.6 pg/mL — ABNORMAL HIGH (ref 0.0–100.0)

## 2022-08-06 MED ORDER — POLYETHYLENE GLYCOL 3350 17 G PO PACK: 17.0000 g | PACK | Freq: Every day | ORAL | Status: DC | PRN

## 2022-08-06 MED ORDER — DILTIAZEM HCL ER COATED BEADS 240 MG PO CP24
240.0000 mg | ORAL_CAPSULE | Freq: Every day | ORAL | Status: DC
  Administered 2022-08-06 – 2022-08-08 (×3): 240 mg via ORAL
  Filled 2022-08-06 (×3): qty 1

## 2022-08-06 MED ORDER — APIXABAN 5 MG PO TABS
5.0000 mg | ORAL_TABLET | Freq: Two times a day (BID) | ORAL | Status: DC
  Administered 2022-08-06 – 2022-08-08 (×5): 5 mg via ORAL
  Filled 2022-08-06 (×5): qty 1

## 2022-08-06 MED ORDER — POTASSIUM CHLORIDE CRYS ER 20 MEQ PO TBCR: 40.0000 meq | EXTENDED_RELEASE_TABLET | Freq: Once | ORAL | Status: DC

## 2022-08-06 MED ORDER — ACETAMINOPHEN 325 MG PO TABS
650.0000 mg | ORAL_TABLET | Freq: Four times a day (QID) | ORAL | Status: DC | PRN
  Administered 2022-08-06 – 2022-08-07 (×2): 650 mg via ORAL
  Filled 2022-08-06 (×2): qty 2

## 2022-08-06 MED ORDER — MELATONIN 5 MG PO TABS
5.0000 mg | ORAL_TABLET | Freq: Every evening | ORAL | Status: DC | PRN
  Administered 2022-08-06 – 2022-08-07 (×2): 5 mg via ORAL
  Filled 2022-08-06 (×2): qty 1

## 2022-08-06 MED ORDER — SODIUM CHLORIDE 0.9% FLUSH
3.0000 mL | Freq: Two times a day (BID) | INTRAVENOUS | Status: DC
  Administered 2022-08-06 – 2022-08-08 (×4): 3 mL via INTRAVENOUS

## 2022-08-06 MED ORDER — DILTIAZEM HCL-DEXTROSE 125-5 MG/125ML-% IV SOLN (PREMIX)
5.0000 mg/h | INTRAVENOUS | Status: DC
  Administered 2022-08-06: 5 mg/h via INTRAVENOUS
  Filled 2022-08-06: qty 125

## 2022-08-06 MED ORDER — APIXABAN 5 MG PO TABS: 5.0000 mg | ORAL_TABLET | Freq: Two times a day (BID) | ORAL | Status: DC

## 2022-08-06 MED ORDER — ACETAMINOPHEN 650 MG RE SUPP: 650.0000 mg | Freq: Four times a day (QID) | RECTAL | Status: DC | PRN

## 2022-08-06 MED ORDER — FUROSEMIDE 10 MG/ML IJ SOLN
40.0000 mg | Freq: Once | INTRAMUSCULAR | Status: AC
  Administered 2022-08-06: 40 mg via INTRAVENOUS
  Filled 2022-08-06: qty 4

## 2022-08-06 MED ORDER — DILTIAZEM HCL ER COATED BEADS 240 MG PO CP24: 240.0000 mg | ORAL_CAPSULE | Freq: Every day | ORAL | Status: DC

## 2022-08-06 MED ORDER — IPRATROPIUM-ALBUTEROL 0.5-2.5 (3) MG/3ML IN SOLN
3.0000 mL | Freq: Four times a day (QID) | RESPIRATORY_TRACT | Status: DC | PRN
  Administered 2022-08-06: 3 mL via RESPIRATORY_TRACT
  Filled 2022-08-06: qty 3

## 2022-08-06 MED ORDER — DILTIAZEM LOAD VIA INFUSION
10.0000 mg | Freq: Once | INTRAVENOUS | Status: AC
  Administered 2022-08-06: 10 mg via INTRAVENOUS
  Filled 2022-08-06: qty 10

## 2022-08-06 MED ORDER — POTASSIUM CHLORIDE CRYS ER 20 MEQ PO TBCR
40.0000 meq | EXTENDED_RELEASE_TABLET | Freq: Once | ORAL | Status: AC
  Administered 2022-08-06: 40 meq via ORAL
  Filled 2022-08-06: qty 2

## 2022-08-06 NOTE — ED Notes (Signed)
ED TO INPATIENT HANDOFF REPORT  ED Nurse Name and Phone #: Andee Poles [phone]  S Name/Age/Gender [name] 89 y.o. female Room/Bed: 032C/032C  Code Status   Code Status: Full Code  Home/SNF/Other Home Patient oriented to: self, place, time, and situation Is this baseline? Yes   Triage Complete: Triage complete  Chief Complaint Acute respiratory failure with hypoxia ([place]) [J96.01]  Triage Note Pt BIB EMS from home complaining of shortness of breath. Per EMS, patient was recently discharged with a new diagnosis of AFIB. Pt was placed on Cardizem, taking it as prescribed. Patient has a history of asthma and has some baseline difficulty breathing. Pt was 90% on RA.  EMS Vitals 166/88 HR 80-130 in A-fib 100% on breathing treatment  Given  10 albuterol 1 atrovent '125mg'$  solumedrol 2g of Magnesium  18# L AC   Allergies Allergies  Allergen Reactions   Atorvastatin Shortness Of Breath   Brilinta [Ticagrelor] Shortness Of Breath    BLEEDING   Clopidogrel Bisulfate Shortness Of Breath    BRUISING   Codeine Nausea And Vomiting   Sulfonamide Derivatives Other (See Comments)    Intestinal upset    Level of Care/Admitting Diagnosis ED Disposition     ED Disposition  Admit   Condition  --   Comment  Hospital Area: [facility] [[id number]]  Level of Care: Progressive [102]  Admit to Progressive based on following criteria: CARDIOVASCULAR & THORACIC of moderate stability with acute coronary syndrome symptoms/low risk myocardial infarction/hypertensive urgency/arrhythmias/heart failure potentially compromising stability and stable post cardiovascular intervention patients.  Admit to Progressive based on following criteria: RESPIRATORY PROBLEMS hypoxemic/hypercapnic respiratory failure that is responsive to NIPPV (BiPAP) or High Flow Nasal Cannula (6-80 lpm). Frequent assessment/intervention, no > Q2 hrs < Q4 hrs, to maintain oxygenation and pulmonary  hygiene.  May place patient in observation at [facility] or [place] if equivalent level of care is available:: No  Covid Evaluation: Asymptomatic - no recent exposure (last 10 days) testing not required  Diagnosis: Acute respiratory failure with hypoxia [facility]) [[id number]]  Admitting Physician: Marcelyn Bruins [[id number]]  Attending Physician: Marcelyn Bruins [phone]          B Medical/Surgery History Past Medical History:  Diagnosis Date   Adenomatous colon polyp MAY 2013   HOSPITALIZED AT [facility] WITH HGB 7 -TRANSFUSED-MASS FOUND IN ASCENDING COLON   Aortic sclerosis    With murmur   Asthma    CAD S/P percutaneous coronary angioplasty 2002, 2003,2014   a) Unstable Angina 7/'02: 1st Cutting PTCA- OM2 --> restenosed 9/'02 NSTEMI--> 3.0 mm x 12 mm BMS-OM 2; b) 11/'03 DOE w/ + Cardiolite - Cutter PTCA for ISR -- patent in 2004 (after False + Cardiolite); c) 07/2012 - Unstable Angina -- PCI to proximal RCA with Promus Premier DES 2.75 mm x 20 mm (3.0 mm), patent OM2 stent ~10% ISR (HARDING)   Complication of anesthesia 2009   HIP REPLACEMENT-PT HAD HARD TIME WAKING UP--FELT LIKE SHE COULDN'T BREATHE   Coronary stent restenosis due to scar tissue 05/2002   Cutting Balloon PTCA to OM2 BMS   Diverticulosis 10/2012   Seen on Colonoscopy   DJD (degenerative joint disease), thoracolumbar    Back, Hips & Knees   Essential hypertension    H/O Colon cancer 10/2011   GI - Drs. Carlean Purl - colonoscopy 10/2012: diverticulosis, stable ileocolic anastomosis of R colon   Hyperlipidemia with target LDL less than 70    Internal hemorrhoids    Iron  deficiency anemia due to chronic blood loss 10/2011   Blood Transfusion (EGD 09/2012 - unremarkable)   Medication intolerance - Plavix, Effient, Brilinta    Easy bruising, GI bleeds (led to Dx of Colon CA), Brilinta - dyspnea   Multiple lung nodules on CT 08/2012   scattered, bilateral but right>left.    Non-Q wave ST elevation myocardial infarction  (STEMI) involving left circumflex coronary artery 03/2001   a) Severe thrombic ISR of prior PTCA site in OM2 --> BMS PCI; b) Echo 08/2012: Normal LV size & function.  EF 55-60%, no regional WMA, Gr 1 DD, mild MR, mild-mod TR - NO Pulmonary HTN   Osteoarthritis (arthritis due to wear and tear of joints)    Bilateral Hip Arthroplasty, L Knee TKA   Osteoporosis    Past Surgical History:  Procedure Laterality Date   APPENDECTOMY     CHOLECYSTECTOMY     COLONOSCOPY  11/16/2011   Procedure: COLONOSCOPY;  Surgeon: Gatha Mayer, MD;  Location: [facility];  Service: Endoscopy;  Laterality: N/A;   COLONOSCOPY  10/2012   Carlean Purl: diverticulosis, stable R colon ileocolic anastomosis   COLONOSCOPY W/ BIOPSIES AND POLYPECTOMY  06/15/2008   adenomatous polyps, diverticulosis, internal hemorrhoids   COLONOSCOPY WITH PROPOFOL N/A 07/18/2015   Procedure: COLONOSCOPY WITH PROPOFOL;  Surgeon: Gatha Mayer, MD;  Location: [facility];  Service: Endoscopy;  Laterality: N/A;   CORONARY ANGIOPLASTY WITH STENT PLACEMENT  2002; 2003; 07/2012   a)  7/'02: 1st Cutting PTCA- OM2 --> restenosed 9/'02 --> 3.0 mm x 12 mm BMS-OM 2; b) 11/'03 - Cutter PTCA for ISR -- patent in 2004;    [place] N/A 03/21/2022   Procedure: DILATATION & CURETTAGE/HYSTEROSCOPY WITH MYOSURE, PAP SMEAR;  Surgeon: Drema Dallas, DO;  Location: [facility];  Service: Gynecology;  Laterality: N/A;   ESOPHAGOGASTRODUODENOSCOPY N/A 10/23/2012   Procedure: ESOPHAGOGASTRODUODENOSCOPY (EGD);  Surgeon: Inda Castle, MD;  Location: [place];  Service: Endoscopy;  Laterality: N/A;   INCISIONAL HERNIA REPAIR N/A 01/29/2013   Procedure: LAPAROSCOPIC INCISIONAL HERNIA;  Surgeon: Harl Bowie, MD;  Location: [facility];  Service: General;  Laterality: N/A;   INSERTION OF MESH N/A 01/29/2013   Procedure: INSERTION OF MESH;  Surgeon: Harl Bowie, MD;  Location: [facility];  Service: General;  Laterality: N/A;    LAPAROSCOPY  03/21/2022   Procedure: DIAGNOSTIC LAPAROSCOPY;  Surgeon: Drema Dallas, DO;  Location: [place];  Service: Gynecology;;   LEFT HEART CATHETERIZATION WITH CORONARY ANGIOGRAM N/A 07/15/2012   Procedure: LEFT HEART CATHETERIZATION WITH CORONARY ANGIOGRAM;  Surgeon: Leonie Man, MD;  Location: [facility] CATH LAB: pRCA 80-90%, OM2 BMS ~10% ISR   NM MYOVIEW LTD  08/2014    Normal LV size and function. Normal wall motion. LOW RISK.   PERCUTANEOUS CORONARY STENT INTERVENTION (PCI-S)  07/15/2012   Procedure: PERCUTANEOUS CORONARY STENT INTERVENTION (PCI-S);  Surgeon: Leonie Man, MD;  Location: [facility] CATH LAB: c) PCI to proximal RCA with Promus Premier DES 2.75 mm x 20 mm (3.0 mm)   REPLACEMENT TOTAL KNEE Left 2003   TOTAL HIP ARTHROPLASTY  09/08/2007   left, Dr. Wynelle Link   TOTAL HIP ARTHROPLASTY Right 2003   TRANSTHORACIC ECHOCARDIOGRAM  08/2012   Normal LV size & function.  EF 55-60%, no regional WMA, Gr 1 DD, mild MR, mild-mod TR; aortic sclerosis without stenosis     A IV Location/Drains/Wounds Patient Lines/Drains/Airways Status     Active Line/Drains/Airways     Name Placement  date Placement time Site Days   Peripheral IV 08/06/22 18 G Left Antecubital 08/06/22  0945  Antecubital  less than 1   Incision - 3 Ports Abdomen 1: Mid;Upper 2: Left;Lateral;Lower 3: Left;Lateral;Upper 03/21/22  1106  -- 138            Intake/Output Last 24 hours No intake or output data in the 24 hours ending 08/06/22 1240  Labs/Imaging Results for orders placed or performed during the hospital encounter of 08/06/22 (from the past 48 hour(s))  Comprehensive metabolic panel     Status: Abnormal   Collection Time: 08/06/22  9:45 AM  Result Value Ref Range   Sodium 139 135 - 145 mmol/L   Potassium 3.5 3.5 - 5.1 mmol/L    Comment: HEMOLYSIS AT THIS LEVEL MAY AFFECT RESULT   Chloride 107 98 - 111 mmol/L   CO2 20 (L) 22 - 32 mmol/L   Glucose, Bld 206 (H) 70 - 99 mg/dL    Comment: Glucose reference  range applies only to samples taken after fasting for at least 8 hours.   BUN 16 8 - 23 mg/dL   Creatinine, Ser 0.90 0.44 - 1.00 mg/dL   Calcium 9.5 8.9 - 10.3 mg/dL   Total Protein 7.0 6.5 - 8.1 g/dL   Albumin 3.7 3.5 - 5.0 g/dL   AST 34 15 - 41 U/L    Comment: HEMOLYSIS AT THIS LEVEL MAY AFFECT RESULT   ALT 22 0 - 44 U/L    Comment: HEMOLYSIS AT THIS LEVEL MAY AFFECT RESULT   Alkaline Phosphatase 50 38 - 126 U/L   Total Bilirubin 1.8 (H) 0.3 - 1.2 mg/dL    Comment: HEMOLYSIS AT THIS LEVEL MAY AFFECT RESULT   GFR, Estimated 60 (L) >60 mL/min    Comment: (NOTE) Calculated using the CKD-EPI Creatinine Equation (2021)    Anion gap 12 5 - 15    Comment: Performed at [facility], [place] [address] [place], [place] [address]  CBC     Status: Abnormal   Collection Time: 08/06/22  9:45 AM  Result Value Ref Range   WBC 10.6 (H) 4.0 - 10.5 K/uL   RBC 3.57 (L) 3.87 - 5.11 MIL/uL   Hemoglobin 11.4 (L) 12.0 - 15.0 g/dL   HCT 34.6 (L) 36.0 - 46.0 %   MCV 96.9 80.0 - 100.0 fL   MCH 31.9 26.0 - 34.0 pg   MCHC 32.9 30.0 - 36.0 g/dL   RDW 14.6 11.5 - 15.5 %   Platelets 269 150 - 400 K/uL   nRBC 0.0 0.0 - 0.2 %    Comment: Performed at [facility], [place] [address] [place], [place] [address]  Magnesium     Status: Abnormal   Collection Time: 08/06/22  9:45 AM  Result Value Ref Range   Magnesium 5.2 (H) 1.7 - 2.4 mg/dL    Comment: Performed at [facility] [address], [place], [place] [address]  Brain natriuretic peptide     Status: Abnormal   Collection Time: 08/06/22  9:45 AM  Result Value Ref Range   B Natriuretic Peptide 243.6 (H) 0.0 - 100.0 pg/mL    Comment: Performed at [facility] [address] [place], [place] [address]  Troponin I (High Sensitivity)     Status: Abnormal   Collection Time: 08/06/22  9:45 AM  Result Value Ref Range   Troponin I (High Sensitivity) 43 (H) <18 ng/L    Comment: (NOTE)  Elevated high sensitivity troponin I (hsTnI) values  and significant  changes across serial measurements may suggest ACS but many other  chronic and acute conditions are known to elevate hsTnI results.  Refer to the "Links" section for chest pain algorithms and additional  guidance. Performed at [facility], [place] [address], [place], [place] [address]   Resp panel by RT-PCR (RSV, Flu A&B, Covid) Anterior Nasal Swab     Status: None   Collection Time: 08/06/22  9:46 AM   Specimen: Anterior Nasal Swab  Result Value Ref Range   SARS Coronavirus 2 by RT PCR NEGATIVE NEGATIVE   Influenza A by PCR NEGATIVE NEGATIVE   Influenza B by PCR NEGATIVE NEGATIVE    Comment: (NOTE) The Xpert Xpress SARS-CoV-2/FLU/RSV plus assay is intended as an aid in the diagnosis of influenza from Nasopharyngeal swab specimens and should not be used as a sole basis for treatment. Nasal washings and aspirates are unacceptable for Xpert Xpress SARS-CoV-2/FLU/RSV testing.  Fact Sheet for Patients: EntrepreneurPulse.com.au  Fact Sheet for Healthcare Providers: IncredibleEmployment.be  This test is not yet approved or cleared by the [place] FDA and has been authorized for detection and/or diagnosis of SARS-CoV-2 by FDA under an Emergency Use Authorization (EUA). This EUA will remain in effect (meaning this test can be used) for the duration of the COVID-19 declaration under Section 564(b)(1) of the Act, 21 U.S.C. section 360bbb-3(b)(1), unless the authorization is terminated or revoked.     Resp Syncytial Virus by PCR NEGATIVE NEGATIVE    Comment: (NOTE) Fact Sheet for Patients: EntrepreneurPulse.com.au  Fact Sheet for Healthcare Providers: IncredibleEmployment.be  This test is not yet approved or cleared by the [place] FDA and has been authorized for detection and/or diagnosis of SARS-CoV-2 by FDA under an Emergency Use Authorization (EUA). This EUA will remain in  effect (meaning this test can be used) for the duration of the COVID-19 declaration under Section 564(b)(1) of the Act, 21 U.S.C. section 360bbb-3(b)(1), unless the authorization is terminated or revoked.  Performed at [facility], [place] [address], [place], [place] [address]   I-Stat venous blood gas, ED     Status: Abnormal   Collection Time: 08/06/22  9:52 AM  Result Value Ref Range   pH, Ven 7.406 7.25 - 7.43   pCO2, Ven 36.2 (L) 44 - 60 mmHg   pO2, Ven 36 32 - 45 mmHg   Bicarbonate 22.7 20.0 - 28.0 mmol/L   TCO2 24 22 - 32 mmol/L   O2 Saturation 70 %   Acid-base deficit 2.0 0.0 - 2.0 mmol/L   Sodium 139 135 - 145 mmol/L   Potassium 3.5 3.5 - 5.1 mmol/L   Calcium, Ion 1.22 1.15 - 1.40 mmol/L   HCT 32.0 (L) 36.0 - 46.0 %   Hemoglobin 10.9 (L) 12.0 - 15.0 g/dL   Sample type VENOUS    Comment NOTIFIED PHYSICIAN   I-Stat Chem 8, ED     Status: Abnormal   Collection Time: 08/06/22  9:53 AM  Result Value Ref Range   Sodium 140 135 - 145 mmol/L   Potassium 3.5 3.5 - 5.1 mmol/L   Chloride 110 98 - 111 mmol/L   BUN 23 8 - 23 mg/dL   Creatinine, Ser 0.80 0.44 - 1.00 mg/dL   Glucose, Bld 198 (H) 70 - 99 mg/dL    Comment: Glucose reference range applies only to samples taken after fasting for at least 8 hours.   Calcium, Ion 1.22  1.15 - 1.40 mmol/L   TCO2 22 22 - 32 mmol/L   Hemoglobin 11.2 (L) 12.0 - 15.0 g/dL   HCT 33.0 (L) 36.0 - 46.0 %  Troponin I (High Sensitivity)     Status: Abnormal   Collection Time: 08/06/22 11:47 AM  Result Value Ref Range   Troponin I (High Sensitivity) 44 (H) <18 ng/L    Comment: (NOTE) Elevated high sensitivity troponin I (hsTnI) values and significant  changes across serial measurements may suggest ACS but many other  chronic and acute conditions are known to elevate hsTnI results.  Refer to the "Links" section for chest pain algorithms and additional  guidance. Performed at [facility], [place] [address], [place], [place] [address]     DG Chest Port 1 View  Result Date: 08/06/2022 CLINICAL DATA:  dyspnea EXAM: PORTABLE CHEST 1 VIEW COMPARISON:  July 27, 2022, September 15, 2016 FINDINGS: The cardiomediastinal silhouette is unchanged in contour. Query small RIGHT pleural effusion. No pneumothorax. Peribronchial cuffing with mild increased bibasilar reticulation. LEFT retrocardiac opacity, favored atelectasis. IMPRESSION: Constellation of findings are favored to reflect mild pulmonary edema with possible small RIGHT pleural effusion. Electronically Signed   By: Valentino Saxon M.D.   On: 08/06/2022 11:00    Pending Labs Unresulted Labs (From admission, onward)     Start     Ordered   08/07/22 0500  Comprehensive metabolic panel  Tomorrow morning,   R        08/06/22 1230   08/07/22 0500  CBC  Tomorrow morning,   R        08/06/22 1230   08/06/22 0947  Blood gas, venous  Once,   R        08/06/22 0946            Vitals/Pain Today's Vitals   08/06/22 1000 08/06/22 1015 08/06/22 1030 08/06/22 1145  BP:  (!) 154/66 133/71 130/82  Pulse: (!) 109 97 (!) 124 (!) 37  Resp: '19 18 19 20  '$ Temp:      TempSrc:      SpO2: 100% 100% 100% 97%  Weight:      Height:      PainSc:        Isolation Precautions No active isolations  Medications Medications  potassium chloride SA (KLOR-CON M) CR tablet 40 mEq (40 mEq Oral Not Given 08/06/22 1224)  diltiazem (CARDIZEM CD) 24 hr capsule 240 mg (has no administration in time range)  apixaban (ELIQUIS) tablet 5 mg (has no administration in time range)  melatonin tablet 5 mg (has no administration in time range)  sodium chloride flush (NS) 0.9 % injection 3 mL (has no administration in time range)  acetaminophen (TYLENOL) tablet 650 mg (has no administration in time range)    Or  acetaminophen (TYLENOL) suppository 650 mg (has no administration in time range)  polyethylene glycol (MIRALAX / GLYCOLAX) packet 17 g (has no administration in time range)  furosemide (LASIX)  injection 40 mg (40 mg Intravenous Given 08/06/22 1237)    Mobility walks with device     Focused Assessments    R Recommendations: See Admitting Provider Note  Report given to:   Additional Notes: [place]

## 2022-08-06 NOTE — ED Triage Notes (Signed)
Pt BIB EMS from home complaining of shortness of breath. Per EMS, patient was recently discharged with a new diagnosis of AFIB. Pt was placed on Cardizem, taking it as prescribed. Patient has a history of asthma and has some baseline difficulty breathing. Pt was 90% on RA.  EMS Vitals 166/88 HR 80-130 in A-fib 100% on breathing treatment  Given  10 albuterol 1 atrovent '125mg'$  solumedrol 2g of Magnesium  18# L AC

## 2022-08-06 NOTE — ED Provider Notes (Signed)
[facility] Provider Note   CSN: [id number] Arrival date & time: 08/06/22  8832     History  Chief Complaint  Patient presents with   Shortness of Breath    [name] is a 89 y.o. female.   Shortness of Breath Patient presents for shortness of breath.  Medical history includes asthma, CAD, HLD, HTN, atrial fibrillation.  She had a recent diagnosis of atrial fibrillation and was started on diltiazem and Eliquis.  She was discharged 1 week ago.  She has been adherent to her home medications.  She states that she felt well last night.  This morning, she woke with shortness of breath.  When EMS arrived on scene, she had labored breathing and accessory muscle use.  She does have a history of asthma and they did note diffuse wheezing on lung auscultation.  Prior to arrival, she was given magnesium, Solu-Medrol, albuterol, and Atrovent.  She does not feel like the breathing treatments are helping.  Vital signs prior to arrival notable for hypertension, irregular heart rate in the range of 80-130.  SpO2 is 90% on room air.  Glucose was in the range of 200.  Chart review, she has not prescribed any diuretic medications.     Home Medications Prior to Admission medications   Medication Sig Start Date End Date Taking? Authorizing Provider  acetaminophen (TYLENOL) 500 MG tablet Take 500 mg by mouth every 8 (eight) hours as needed for moderate pain.    [provider]  albuterol (PROVENTIL HFA;VENTOLIN HFA) 108 (90 BASE) MCG/ACT inhaler Inhale 2 puffs into the lungs every 6 (six) hours as needed for wheezing.    [provider]  apixaban (ELIQUIS) 5 MG TABS tablet Take 1 tablet (5 mg total) by mouth 2 (two) times daily. 07/29/22 09/27/22  Elodia Florence., MD  CALCIUM-MAGNESIUM-ZINC PO Take 0.5 tablets by mouth daily.    [provider]  diltiazem (CARDIZEM CD) 240 MG 24 hr capsule Take 1 capsule (240 mg total) by mouth  daily. 07/30/22 09/28/22  Elodia Florence., MD  fluticasone (FLONASE) 50 MCG/ACT nasal spray Place 1 spray into the nose daily as needed for allergies.    [provider]  loratadine (CLARITIN) 10 MG tablet Take 10 mg by mouth daily as needed for allergies.    [provider]  melatonin 5 MG TABS Take 5 mg by mouth at bedtime as needed (sleep).    [provider]  nitroGLYCERIN (NITROSTAT) 0.4 MG SL tablet DISSOLVE 1 TABLET UNDER THE TONGUE EVERY 5 MINUTES AS NEEDED FOR CHEST PAIN Patient taking differently: Place 0.4 mg under the tongue every 5 (five) minutes as needed for chest pain. 10/01/16   Leonie Man, MD      Allergies    Atorvastatin, Brilinta [ticagrelor], Clopidogrel bisulfate, Codeine, and Sulfonamide derivatives    Review of Systems   Review of Systems  Respiratory:  Positive for shortness of breath.   All other systems reviewed and are negative.   Physical Exam Updated Vital Signs BP 130/82   Pulse (!) 37   Temp (!) 97.5 F (36.4 C) (Axillary)   Resp 20   Ht '4\' 11"'$  (1.499 m)   Wt 63.5 kg   SpO2 97%   BMI 28.28 kg/m  Physical Exam Vitals and nursing note reviewed.  Constitutional:      General: She is not in acute distress.    Appearance: She is well-developed. She is  ill-appearing. She is not toxic-appearing or diaphoretic.  HENT:     Head: Normocephalic and atraumatic.     Mouth/Throat:     Mouth: Mucous membranes are moist.  Eyes:     Extraocular Movements: Extraocular movements intact.     Conjunctiva/sclera: Conjunctivae normal.  Cardiovascular:     Rate and Rhythm: Tachycardia present. Rhythm irregular.     Heart sounds: No murmur heard. Pulmonary:     Effort: Pulmonary effort is normal. Tachypnea present. No respiratory distress.     Breath sounds: Decreased breath sounds, wheezing and rales present.     Comments: Biapical B-lines on bedside ultrasound Chest:     Chest wall: No tenderness.  Abdominal:      Palpations: Abdomen is soft.     Tenderness: There is no abdominal tenderness.  Musculoskeletal:        General: No swelling. Normal range of motion.     Cervical back: Neck supple.     Right lower leg: No edema.     Left lower leg: No edema.  Skin:    General: Skin is warm and dry.     Capillary Refill: Capillary refill takes less than 2 seconds.     Coloration: Skin is not cyanotic or pale.  Neurological:     General: No focal deficit present.     Mental Status: She is alert and oriented to person, place, and time.  Psychiatric:        Mood and Affect: Mood normal.        Behavior: Behavior normal.     ED Results / Procedures / Treatments   Labs (all labs ordered are listed, but only abnormal results are displayed) Labs Reviewed  COMPREHENSIVE METABOLIC PANEL - Abnormal; Notable for the following components:      Result Value   CO2 20 (*)    Glucose, Bld 206 (*)    Total Bilirubin 1.8 (*)    GFR, Estimated 60 (*)    All other components within normal limits  CBC - Abnormal; Notable for the following components:   WBC 10.6 (*)    RBC 3.57 (*)    Hemoglobin 11.4 (*)    HCT 34.6 (*)    All other components within normal limits  MAGNESIUM - Abnormal; Notable for the following components:   Magnesium 5.2 (*)    All other components within normal limits  BRAIN NATRIURETIC PEPTIDE - Abnormal; Notable for the following components:   B Natriuretic Peptide 243.6 (*)    All other components within normal limits  I-STAT CHEM 8, ED - Abnormal; Notable for the following components:   Glucose, Bld 198 (*)    Hemoglobin 11.2 (*)    HCT 33.0 (*)    All other components within normal limits  I-STAT VENOUS BLOOD GAS, ED - Abnormal; Notable for the following components:   pCO2, Ven 36.2 (*)    HCT 32.0 (*)    Hemoglobin 10.9 (*)    All other components within normal limits  TROPONIN I (HIGH SENSITIVITY) - Abnormal; Notable for the following components:   Troponin I (High  Sensitivity) 43 (*)    All other components within normal limits  RESP PANEL BY RT-PCR (RSV, FLU A&B, COVID)  RVPGX2  BLOOD GAS, VENOUS  TROPONIN I (HIGH SENSITIVITY)    EKG EKG Interpretation  Date/Time:  Monday August 06 2022 10:00:50 EST Ventricular Rate:  123 PR Interval:    QRS Duration: 151 QT Interval:  343 QTC Calculation: 383  R Axis:   45 Text Interpretation: Atrial fibrillation Nonspecific intraventricular conduction delay Confirmed by Godfrey Pick [phone]) on 08/06/2022 10:02:10 AM  Radiology DG Chest Port 1 View  Result Date: 08/06/2022 CLINICAL DATA:  dyspnea EXAM: PORTABLE CHEST 1 VIEW COMPARISON:  July 27, 2022, September 15, 2016 FINDINGS: The cardiomediastinal silhouette is unchanged in contour. Query small RIGHT pleural effusion. No pneumothorax. Peribronchial cuffing with mild increased bibasilar reticulation. LEFT retrocardiac opacity, favored atelectasis. IMPRESSION: Constellation of findings are favored to reflect mild pulmonary edema with possible small RIGHT pleural effusion. Electronically Signed   By: Valentino Saxon M.D.   On: 08/06/2022 11:00    Procedures Procedures    Medications Ordered in ED Medications  potassium chloride SA (KLOR-CON M) CR tablet 40 mEq (has no administration in time range)  furosemide (LASIX) injection 40 mg (has no administration in time range)    ED Course/ Medical Decision Making/ A&P                             Medical Decision Making Amount and/or Complexity of Data Reviewed Labs: ordered. Radiology: ordered.  Risk Prescription drug management.   This patient presents to the ED for concern of shortness of breath, this involves an extensive number of treatment options, and is a complaint that carries with it a high risk of complications and morbidity.  The differential diagnosis includes asthma exacerbation, CHF, pneumonia, PE, ACS, hypertensive emergency   Co morbidities that complicate the patient  evaluation  asthma, CAD, HLD, HTN, atrial fibrillation   Additional history obtained:  Additional history obtained from patient's daughter External records from outside source obtained and reviewed including EMR   Lab Tests:  I Ordered, and personally interpreted labs.  The pertinent results include: Baseline anemia, mild leukocytosis, moderate elevation in BNP, low additional potassium with hemolysis present suggest hypokalemia.  Magnesium is elevated.  She will troponin is mildly elevated.   Imaging Studies ordered:  I ordered imaging studies including chest x-ray I independently visualized and interpreted imaging which showed pulm edema with right pleural effusion I agree with the radiologist interpretation   Cardiac Monitoring: / EKG:  The patient was maintained on a cardiac monitor.  I personally viewed and interpreted the cardiac monitored which showed an underlying rhythm of: Atrial fibrillation with RVR  Problem List / ED Course / Critical interventions / Medication management  Patient presenting for shortness of breath.  She reports that the onset was this morning.  EMS noted respiratory distress on scene.  She was SpO2 90% on room air.  She does have history of asthma and was provided with treatment for asthma exacerbation prior to arrival.  She has had minimal improvement.  On arrival in the ED, she remains tachypneic with increased work of breathing, while on nebulized breathing treatment.  On bedside ultrasound, she has biapical B-lines.  I suspect cardiogenic pulmonary edema.  BiPAP to be initiated.  Laboratory workup was ordered.  On lab work, she does have a moderately elevated BNP.  Potassium is low-normal with analysis present.  Potassium replacement was ordered.  Other notable lab findings include hypomagnesemia, mild elevation in troponin, and mild leukocytosis.  On x-ray, findings are consistent with cardiogenic pulmonary edema.  Patient was given Lasix.  She  remained on BiPAP and was tolerating it well.  She remained tachycardic in the range of 1 10-1 30.  Blood pressure remains normal.  At this time, will allow permissive tachycardia.  Patient to be admitted for further management. I ordered medication including potassium chloride for hypokalemia; Lasix for diuresis Reevaluation of the patient after these medicines showed that the patient improved I have reviewed the patients home medicines and have made adjustments as needed   Social Determinants of Health:  Has access to outpatient care, lives independently  CRITICAL CARE Performed by: Godfrey Pick   Total critical care time: 35 minutes  Critical care time was exclusive of separately billable procedures and treating other patients.  Critical care was necessary to treat or prevent imminent or life-threatening deterioration.  Critical care was time spent personally by me on the following activities: development of treatment plan with patient and/or surrogate as well as nursing, discussions with consultants, evaluation of patient's response to treatment, examination of patient, obtaining history from patient or surrogate, ordering and performing treatments and interventions, ordering and review of laboratory studies, ordering and review of radiographic studies, pulse oximetry and re-evaluation of patient's condition.          Final Clinical Impression(s) / ED Diagnoses Final diagnoses:  SOB (shortness of breath)  Acute on chronic congestive heart failure, unspecified heart failure type ([place])  Acute pulmonary edema [facility])    Rx / DC Orders ED Discharge Orders     None         Godfrey Pick, MD 08/06/22 1212

## 2022-08-06 NOTE — H&P (Addendum)
History and Physical   [name] QIO:[id number] DOB: 1928-02-20 DOA: 08/06/2022  PCP: Charlane Ferretti, MD   Patient coming from: Home  Chief Complaint: Shortness of breath  HPI: [name] is a 89 y.o. female with medical history significant of CAD, hyperlipidemia, hypertension, obesity, colon cancer, asthma, atrial fibrillation presenting with shortness of breath.  History obtained with assistance of family due to patient being on BiPAP.  Patient felt okay last night but woke this morning with shortness of breath.  EMS was called and noted increased work  breathing with accessory muscle use and diffuse wheezing.  Family felt he could hear wheezing over the phone when they spoke prior to EMS being called as well.  She received magnesium, Solu-Medrol, albuterol, Atrovent and route.  Patient reported on arrival that she does not feel that much better after these treatments.  Of note she was recently admitted with new onset A-fib with RVR.  Discharged on diltiazem and Eliquis.  Echo at that time showed EF 60-65%, G2 DD.  She has been compliant with her medications.  She denies fevers, chills, chest pain, abdominal pain, constipation, diarrhea, nausea, vomiting.  ED Course: Vital signs in ED significant for blood pressure in the 324M systolic which initial presenting blood pressure in the 010 systolic.  Heart rate in the 110s to 130s in A-fib.  Patient 90% on room air at 1 point but has been receiving breathing treatments and then placed on BiPAP for comfort.  Lab workup included CMP with bicarb 20, glucose 206, T. bili 1.8.  CBC with mild leukocytosis to 10.6, hemoglobin stable 11.4.  Magnesium 5.2.  Troponin 43 with repeat pending.  Respiratory panel for flu COVID and RSV negative.  Chest x-ray showed findings consistent with pulmonary edema with possible small right pleural effusion.  Patient received dose of IV Lasix, 40 mill equivalents p.o. potassium and placed on BiPAP in the ED as  above.  Review of Systems: As per HPI otherwise all other systems reviewed and are negative.  Past Medical History:  Diagnosis Date   Adenomatous colon polyp MAY 2013   HOSPITALIZED AT [facility] WITH HGB 7 -TRANSFUSED-MASS FOUND IN ASCENDING COLON   Aortic sclerosis    With murmur   Asthma    CAD S/P percutaneous coronary angioplasty 2002, 2003,2014   a) Unstable Angina 7/'02: 1st Cutting PTCA- OM2 --> restenosed 9/'02 NSTEMI--> 3.0 mm x 12 mm BMS-OM 2; b) 11/'03 DOE w/ + Cardiolite - Cutter PTCA for ISR -- patent in 2004 (after False + Cardiolite); c) 07/2012 - Unstable Angina -- PCI to proximal RCA with Promus Premier DES 2.75 mm x 20 mm (3.0 mm), patent OM2 stent ~10% ISR (HARDING)   Complication of anesthesia 2009   HIP REPLACEMENT-PT HAD HARD TIME WAKING UP--FELT LIKE SHE COULDN'T BREATHE   Coronary stent restenosis due to scar tissue 05/2002   Cutting Balloon PTCA to OM2 BMS   Diverticulosis 10/2012   Seen on Colonoscopy   DJD (degenerative joint disease), thoracolumbar    Back, Hips & Knees   Essential hypertension    H/O Colon cancer 10/2011   GI - Drs. Carlean Purl - colonoscopy 10/2012: diverticulosis, stable ileocolic anastomosis of R colon   Hyperlipidemia with target LDL less than 70    Internal hemorrhoids    Iron deficiency anemia due to chronic blood loss 10/2011   Blood Transfusion (EGD 09/2012 - unremarkable)   Medication intolerance - Plavix, Effient, Brilinta    Easy bruising, GI bleeds (  led to Dx of Colon CA), Brilinta - dyspnea   Multiple lung nodules on CT 08/2012   scattered, bilateral but right>left.    Non-Q wave ST elevation myocardial infarction (STEMI) involving left circumflex coronary artery 03/2001   a) Severe thrombic ISR of prior PTCA site in OM2 --> BMS PCI; b) Echo 08/2012: Normal LV size & function.  EF 55-60%, no regional WMA, Gr 1 DD, mild MR, mild-mod TR - NO Pulmonary HTN   Osteoarthritis (arthritis due to wear and tear of joints)    Bilateral Hip  Arthroplasty, L Knee TKA   Osteoporosis     Past Surgical History:  Procedure Laterality Date   APPENDECTOMY     CHOLECYSTECTOMY     COLONOSCOPY  11/16/2011   Procedure: COLONOSCOPY;  Surgeon: Gatha Mayer, MD;  Location: [place];  Service: Endoscopy;  Laterality: N/A;   COLONOSCOPY  10/2012   Carlean Purl: diverticulosis, stable R colon ileocolic anastomosis   COLONOSCOPY W/ BIOPSIES AND POLYPECTOMY  06/15/2008   adenomatous polyps, diverticulosis, internal hemorrhoids   COLONOSCOPY WITH PROPOFOL N/A 07/18/2015   Procedure: COLONOSCOPY WITH PROPOFOL;  Surgeon: Gatha Mayer, MD;  Location: [facility];  Service: Endoscopy;  Laterality: N/A;   CORONARY ANGIOPLASTY WITH STENT PLACEMENT  2002; 2003; 07/2012   a)  7/'02: 1st Cutting PTCA- OM2 --> restenosed 9/'02 --> 3.0 mm x 12 mm BMS-OM 2; b) 11/'03 - Cutter PTCA for ISR -- patent in 2004;    [place] N/A 03/21/2022   Procedure: DILATATION & CURETTAGE/HYSTEROSCOPY WITH MYOSURE, PAP SMEAR;  Surgeon: Drema Dallas, DO;  Location: [place];  Service: Gynecology;  Laterality: N/A;   ESOPHAGOGASTRODUODENOSCOPY N/A 10/23/2012   Procedure: ESOPHAGOGASTRODUODENOSCOPY (EGD);  Surgeon: Inda Castle, MD;  Location: [place];  Service: Endoscopy;  Laterality: N/A;   INCISIONAL HERNIA REPAIR N/A 01/29/2013   Procedure: LAPAROSCOPIC INCISIONAL HERNIA;  Surgeon: Harl Bowie, MD;  Location: [facility];  Service: General;  Laterality: N/A;   INSERTION OF MESH N/A 01/29/2013   Procedure: INSERTION OF MESH;  Surgeon: Harl Bowie, MD;  Location: [facility];  Service: General;  Laterality: N/A;   LAPAROSCOPY  03/21/2022   Procedure: DIAGNOSTIC LAPAROSCOPY;  Surgeon: Drema Dallas, DO;  Location: [place];  Service: Gynecology;;   LEFT HEART CATHETERIZATION WITH CORONARY ANGIOGRAM N/A 07/15/2012   Procedure: LEFT HEART CATHETERIZATION WITH CORONARY ANGIOGRAM;  Surgeon: Leonie Man, MD;  Location[facility] CATH LAB:  pRCA 80-90%, OM2 BMS ~10% ISR   NM MYOVIEW LTD  08/2014    Normal LV size and function. Normal wall motion. LOW RISK.   PERCUTANEOUS CORONARY STENT INTERVENTION (PCI-S)  07/15/2012   Procedure: PERCUTANEOUS CORONARY STENT INTERVENTION (PCI-S);  Surgeon: Leonie Man, MD;  Location: [facility] CATH LAB: c) PCI to proximal RCA with Promus Premier DES 2.75 mm x 20 mm (3.0 mm)   REPLACEMENT TOTAL KNEE Left 2003   TOTAL HIP ARTHROPLASTY  09/08/2007   left, Dr. Wynelle Link   TOTAL HIP ARTHROPLASTY Right 2003   TRANSTHORACIC ECHOCARDIOGRAM  08/2012   Normal LV size & function.  EF 55-60%, no regional WMA, Gr 1 DD, mild MR, mild-mod TR; aortic sclerosis without stenosis    Social History  reports that she quit smoking about 40 years ago. Her smoking use included cigarettes. She has never used smokeless tobacco. She reports current alcohol use. She reports that she does not use drugs.  Allergies  Allergen Reactions   Atorvastatin Shortness Of Breath  Brilinta [Ticagrelor] Shortness Of Breath    BLEEDING   Clopidogrel Bisulfate Shortness Of Breath    BRUISING   Codeine Nausea And Vomiting   Sulfonamide Derivatives Other (See Comments)    Intestinal upset    Family History  Problem Relation Age of Onset   Pancreatic cancer Father    Prostate cancer Father    Skin cancer Father    Heart disease Mother    Hypertension Mother    Pancreatic cancer Brother    Heart disease Sister    Colon cancer Brother        ? Colostomy   Prostate cancer Brother    Colon cancer Sister    Melanoma Brother   Reviewed on admission  Prior to Admission medications   Medication Sig Start Date End Date Taking? Authorizing Provider  acetaminophen (TYLENOL) 500 MG tablet Take 500 mg by mouth every 8 (eight) hours as needed for moderate pain.    [provider]  albuterol (PROVENTIL HFA;VENTOLIN HFA) 108 (90 BASE) MCG/ACT inhaler Inhale 2 puffs into the lungs every 6 (six) hours as needed for wheezing.     [provider]  apixaban (ELIQUIS) 5 MG TABS tablet Take 1 tablet (5 mg total) by mouth 2 (two) times daily. 07/29/22 09/27/22  Elodia Florence., MD  CALCIUM-MAGNESIUM-ZINC PO Take 0.5 tablets by mouth daily.    [provider]  diltiazem (CARDIZEM CD) 240 MG 24 hr capsule Take 1 capsule (240 mg total) by mouth daily. 07/30/22 09/28/22  Elodia Florence., MD  fluticasone (FLONASE) 50 MCG/ACT nasal spray Place 1 spray into the nose daily as needed for allergies.    [provider]  loratadine (CLARITIN) 10 MG tablet Take 10 mg by mouth daily as needed for allergies.    [provider]  melatonin 5 MG TABS Take 5 mg by mouth at bedtime as needed (sleep).    [provider]  nitroGLYCERIN (NITROSTAT) 0.4 MG SL tablet DISSOLVE 1 TABLET UNDER THE TONGUE EVERY 5 MINUTES AS NEEDED FOR CHEST PAIN Patient taking differently: Place 0.4 mg under the tongue every 5 (five) minutes as needed for chest pain. 10/01/16   Leonie Man, MD    Physical Exam: Vitals:   08/06/22 1000 08/06/22 1015 08/06/22 1030 08/06/22 1145  BP:  (!) 154/66 133/71 130/82  Pulse: (!) 109 97 (!) 124 (!) 37  Resp: '19 18 19 20  '$ Temp:      TempSrc:      SpO2: 100% 100% 100% 97%  Weight:      Height:        Physical Exam Constitutional:      General: She is not in acute distress.    Appearance: Normal appearance.  HENT:     Head: Normocephalic and atraumatic.     Mouth/Throat:     Mouth: Mucous membranes are moist.     Pharynx: Oropharynx is clear.  Eyes:     Extraocular Movements: Extraocular movements intact.     Pupils: Pupils are equal, round, and reactive to light.  Cardiovascular:     Rate and Rhythm: Normal rate and regular rhythm.     Pulses: Normal pulses.     Heart sounds: Normal heart sounds.  Pulmonary:     Effort: Pulmonary effort is normal. No respiratory distress.     Breath sounds: Examination of the left-lower field reveals rales. Rales (trace)  present. No wheezing.  Abdominal:     General: Bowel sounds are  normal. There is no distension.     Palpations: Abdomen is soft.     Tenderness: There is no abdominal tenderness.  Musculoskeletal:        General: No swelling or deformity.  Skin:    General: Skin is warm and dry.  Neurological:     General: No focal deficit present.     Mental Status: Mental status is at baseline.    Labs on Admission: I have personally reviewed following labs and imaging studies  CBC: Recent Labs  Lab 08/06/22 0945 08/06/22 0952 08/06/22 0953  WBC 10.6*  --   --   HGB 11.4* 10.9* 11.2*  HCT 34.6* 32.0* 33.0*  MCV 96.9  --   --   PLT 269  --   --     Basic Metabolic Panel: Recent Labs  Lab 08/06/22 0945 08/06/22 0952 08/06/22 0953  NA 139 139 140  K 3.5 3.5 3.5  CL 107  --  110  CO2 20*  --   --   GLUCOSE 206*  --  198*  BUN 16  --  23  CREATININE 0.90  --  0.80  CALCIUM 9.5  --   --   MG 5.2*  --   --     GFR: Estimated Creatinine Clearance: 36.3 mL/min (by C-G formula based on SCr of 0.8 mg/dL).  Liver Function Tests: Recent Labs  Lab 08/06/22 0945  AST 34  ALT 22  ALKPHOS 50  BILITOT 1.8*  PROT 7.0  ALBUMIN 3.7    Urine analysis:    Component Value Date/Time   COLORURINE YELLOW 09/15/2016 2104   APPEARANCEUR CLEAR 09/15/2016 2104   LABSPEC 1.017 09/15/2016 2104   PHURINE 5.0 09/15/2016 2104   GLUCOSEU NEGATIVE 09/15/2016 2104   HGBUR NEGATIVE 09/15/2016 2104   BILIRUBINUR NEGATIVE 09/15/2016 2104   KETONESUR NEGATIVE 09/15/2016 2104   PROTEINUR NEGATIVE 09/15/2016 2104   UROBILINOGEN 0.2 10/22/2012 1726   NITRITE NEGATIVE 09/15/2016 2104   LEUKOCYTESUR SMALL (A) 09/15/2016 2104    Radiological Exams on Admission: DG Chest Port 1 View  Result Date: 08/06/2022 CLINICAL DATA:  dyspnea EXAM: PORTABLE CHEST 1 VIEW COMPARISON:  July 27, 2022, September 15, 2016 FINDINGS: The cardiomediastinal silhouette is unchanged in contour. Query small RIGHT pleural  effusion. No pneumothorax. Peribronchial cuffing with mild increased bibasilar reticulation. LEFT retrocardiac opacity, favored atelectasis. IMPRESSION: Constellation of findings are favored to reflect mild pulmonary edema with possible small RIGHT pleural effusion. Electronically Signed   By: Valentino Saxon M.D.   On: 08/06/2022 11:00    EKG: Independently reviewed.  A-fib rhythm 103 bpm.  Significant baseline wander.  Suspect nonspecific intraventricular conduction delay.  Difficult to interpret due to quality.  Assessment/Plan Principal Problem:   Acute respiratory failure with hypoxia (HCC) Active Problems:   Obesity (BMI 30-39.9)   Asthma   CAD S/P PCI: OM2 PTCA 7/02, OM2 Stent 9/02, ISR 11/03 - PTCA; RCA DES 1 /14/14   S/P NSTEMI -- involving left circumflex coronary artery (OM2) In-stent Thrombosis   Hyperlipidemia with target LDL less than 70   Essential hypertension   Atrial fibrillation with rapid ventricular response (HCC)   Acute respite failure with hypoxia Asthma exacerbation Acute diastolic CHF > Patient presenting with new onset shortness of breath this morning noted to have significant accessory muscle use and diffuse wheezing.  Did not respond significantly to initial breathing treatments steroids magnesium and route by EMS. > Patient was noted to have evidence of pulmonary edema on  chest x-ray and B-lines consistent with edema on POCUS in ED.  BNP mildly elevated at 243.  Magnesium elevated at 5.2 status post IV mag infusion.  Appears to be more volume overload, diastolic dysfunction driven than asthma (the degree of asthma exacerbation could have triggered increased heart rate leading to worsening diastolic function). Cardiac wheeze less likely. > Echo during recent admission for new onset A-fib showed grade 2 diastolic dysfunction and EF of 60-65%.  > Did have her hydrochlorothiazide discontinued at that time which made been helping prevent her from retaining fluid,  this combined with elevated heart rate / Afib may have led to exacerbation of diastolic dysfunction. > Received dose of IV Lasix in the ED and has been placed on BiPAP and has improved work of breathing.  VBG looks okay with normal pH and mildly low pCO2 at 36.2. > By the time of my exam, no wheezing and trace rales (suspect BiPAP helped significantly) - Monitor in progressive unit - Continue with BiPAP, wean/ trial off as tolerated - Will proceed with initial dose of IV Lasix and reevaluate in the morning if she needs further - Strict I's and O's, daily weights - Trend renal function and electrolytes - No new echo, recent previous - As needed DuoNeb - Check Viral panel for possible asthma cause  Hypomagnesemia > Magnesium noted to be elevated to 5.2 in setting of recent IV magnesium administration.  Is receiving Lasix as above. - Recheck magnesium the morning, monitor response to Lasix  CAD > Significant CAD history with multiple stenting and history of in-stent thrombosis.  Has had medication tolerance issues to multiple antiplatelets.  ASA stopped with recent addition of Eliquis. - Continue home Eliquis respiratory failure with hypoxia.   Atrial fibrillation with RVR > Recently diagnosed during admission a week or 2 ago.  Discharged on diltiazem and Eliquis. > Heart rate currently in the 110s to 120s as above, will give home meds when off BiPAP; if further elevates to persistently in the 130s will need to start on infusion medication. - Continue with home diltiazem, Eliquis ADDENDUM > Rate on floor 130s-140s, starting IV diltiazem bolus and infusion  Hypertension - Continue home diltiazem  Obesity - Noted  History of colon cancer in 2013 > Status post resection - Noted  DVT prophylaxis: Eliquis Code Status:   Full Family Communication:  Updated at bedside  Disposition Plan:   Patient is from:  Home  Anticipated DC to:  Home  Anticipated DC date:  1 to 3  days  Anticipated DC barriers: None  Consults called:  None Admission status:  Observation, progressive  Severity of Illness: The appropriate patient status for this patient is OBSERVATION. Observation status is judged to be reasonable and necessary in order to provide the required intensity of service to ensure the patient's safety. The patient's presenting symptoms, physical exam findings, and initial radiographic and laboratory data in the context of their medical condition is felt to place them at decreased risk for further clinical deterioration. Furthermore, it is anticipated that the patient will be medically stable for discharge from the hospital within 2 midnights of admission.    Marcelyn Bruins MD [facility]  How to contact the [facility] Attending or Consulting provider [place] or covering provider during after hours [place], for this patient?   Check the care team in [facility] and look for a) attending/consulting TR[facility] provider listed and b) the [facility] team listed Log into [email or web address] and use [place]'s  universal password to access. If you do not have the password, please contact the hospital operator. Locate the [facility] provider you are looking for under [facility] and page to a number that you can be directly reached. If you still have difficulty reaching the provider, please page the [facility] (Director on Call) for the Hospitalists listed on amion for assistance.  08/06/2022, 12:30 PM

## 2022-08-06 NOTE — Progress Notes (Signed)
Pt refusing BIPAP QHS at this time and is on 2L [place] stable.Will continue to monitor

## 2022-08-06 NOTE — Progress Notes (Signed)
Heart Failure Navigator Progress Note  Assessed for Heart & Vascular TOC clinic readiness.  Patient EF 60-65%, has a close cardiology appointment with [facility] on 08/10/2022.   Navigator will sign off at this time. Earnestine Leys, BSN, RN Heart Failure Transport planner Only

## 2022-08-06 NOTE — Progress Notes (Signed)
Pt placed on bipap 10/5 on 40 % and is tolerating well. RT will monitor.

## 2022-08-07 DIAGNOSIS — J45901 Unspecified asthma with (acute) exacerbation: Secondary | ICD-10-CM | POA: Diagnosis not present

## 2022-08-07 DIAGNOSIS — J9601 Acute respiratory failure with hypoxia: Secondary | ICD-10-CM | POA: Diagnosis not present

## 2022-08-07 DIAGNOSIS — E669 Obesity, unspecified: Secondary | ICD-10-CM | POA: Diagnosis not present

## 2022-08-07 DIAGNOSIS — I5033 Acute on chronic diastolic (congestive) heart failure: Secondary | ICD-10-CM | POA: Diagnosis not present

## 2022-08-07 LAB — COMPREHENSIVE METABOLIC PANEL
ALT: 20 U/L (ref 0–44)
AST: 23 U/L (ref 15–41)
Albumin: 3.5 g/dL (ref 3.5–5.0)
Alkaline Phosphatase: 47 U/L (ref 38–126)
Anion gap: 12 (ref 5–15)
BUN: 21 mg/dL (ref 8–23)
CO2: 21 mmol/L — ABNORMAL LOW (ref 22–32)
Calcium: 9.5 mg/dL (ref 8.9–10.3)
Chloride: 106 mmol/L (ref 98–111)
Creatinine, Ser: 0.96 mg/dL (ref 0.44–1.00)
GFR, Estimated: 56 mL/min — ABNORMAL LOW (ref >60)
Glucose, Bld: 187 mg/dL — ABNORMAL HIGH (ref 70–99)
Potassium: 3.7 mmol/L (ref 3.5–5.1)
Sodium: 139 mmol/L (ref 135–145)
Total Bilirubin: 1.7 mg/dL — ABNORMAL HIGH (ref 0.3–1.2)
Total Protein: 6.6 g/dL (ref 6.5–8.1)

## 2022-08-07 LAB — CBC
HCT: 31.6 % — ABNORMAL LOW (ref 36.0–46.0)
Hemoglobin: 10.6 g/dL — ABNORMAL LOW (ref 12.0–15.0)
MCH: 31.5 pg (ref 26.0–34.0)
MCHC: 33.5 g/dL (ref 30.0–36.0)
MCV: 94 fL (ref 80.0–100.0)
Platelets: 264 10*3/uL (ref 150–400)
RBC: 3.36 MIL/uL — ABNORMAL LOW (ref 3.87–5.11)
RDW: 14.5 % (ref 11.5–15.5)
WBC: 11.3 10*3/uL — ABNORMAL HIGH (ref 4.0–10.5)
nRBC: 0 % (ref 0.0–0.2)

## 2022-08-07 MED ORDER — POTASSIUM CHLORIDE 20 MEQ PO PACK
40.0000 meq | PACK | Freq: Once | ORAL | Status: AC
  Administered 2022-08-07: 40 meq via ORAL
  Filled 2022-08-07: qty 2

## 2022-08-07 MED ORDER — ACETAMINOPHEN 325 MG PO TABS
650.0000 mg | ORAL_TABLET | Freq: Once | ORAL | Status: AC
  Administered 2022-08-07: 650 mg via ORAL
  Filled 2022-08-07: qty 2

## 2022-08-07 MED ORDER — POTASSIUM CHLORIDE CRYS ER 20 MEQ PO TBCR
40.0000 meq | EXTENDED_RELEASE_TABLET | Freq: Once | ORAL | Status: DC
  Filled 2022-08-07: qty 2

## 2022-08-07 MED ORDER — FUROSEMIDE 10 MG/ML IJ SOLN
40.0000 mg | Freq: Once | INTRAMUSCULAR | Status: AC
  Administered 2022-08-07: 40 mg via INTRAVENOUS
  Filled 2022-08-07 (×2): qty 4

## 2022-08-07 MED ORDER — LEVALBUTEROL HCL 0.63 MG/3ML IN NEBU: 0.6300 mg | INHALATION_SOLUTION | Freq: Four times a day (QID) | RESPIRATORY_TRACT | Status: DC | PRN

## 2022-08-07 MED ORDER — FUROSEMIDE 40 MG PO TABS
40.0000 mg | ORAL_TABLET | Freq: Every day | ORAL | Status: DC
  Administered 2022-08-08: 40 mg via ORAL
  Filled 2022-08-07: qty 1

## 2022-08-07 MED ORDER — IPRATROPIUM BROMIDE 0.02 % IN SOLN: 0.5000 mg | Freq: Four times a day (QID) | RESPIRATORY_TRACT | Status: DC | PRN

## 2022-08-07 MED ORDER — MELATONIN 5 MG PO CHEW: 5.0000 mg | CHEWABLE_TABLET | Freq: Every evening | ORAL | Status: DC | PRN

## 2022-08-07 MED ORDER — TRAMADOL HCL 50 MG PO TABS
25.0000 mg | ORAL_TABLET | Freq: Four times a day (QID) | ORAL | Status: DC | PRN
  Administered 2022-08-07 – 2022-08-08 (×2): 25 mg via ORAL
  Filled 2022-08-07 (×2): qty 1

## 2022-08-07 NOTE — Evaluation (Signed)
Physical Therapy Evaluation Patient Details Name: [name] MRN: [id number] DOB: 1927/11/26 Today's Date: 08/07/2022  History of Present Illness  Pt is a 89 y.o. F who presents 07/27/2022 for evaluation of new onset fibrillation. Significant PMH: CAD, HTN, HLD, colon CA, anemia.  Clinical Impression   Pt admitted with above diagnosis. Lives at home alone (family consistently checks in, and local), in a single-level home with a level entry; Prior to admission, pt was able to manage mobility and basic ADLs independently, drives, simple cooking; Presents to PT with decr endurance, decr functional capacity, generalized weakness; currently needing min assist for bed mobility, supervision to stand to RW and walk short distnce in room; pt experienced DOE, and currently Patient requires supplemental oxygen to maintain oxygen saturations at acceptable, safe levels with physical activity;  Pt currently with functional limitations due to the deficits listed below (see PT Problem List). Pt will benefit from skilled PT to increase their independence and safety with mobility to allow discharge to the venue listed below.          Recommendations for follow up therapy are one component of a multi-disciplinary discharge planning process, led by the attending physician.  Recommendations may be updated based on patient status, additional functional criteria and insurance authorization.  Follow Up Recommendations Home health PT      Assistance Recommended at Discharge Intermittent Supervision/Assistance  Patient can return home with the following  Assistance with cooking/housework;Assist for transportation    Equipment Recommendations None recommended by PT (well-equipped)  Recommendations for Other Services       Functional Status Assessment Patient has had a recent decline in their functional status and demonstrates the ability to make significant improvements in function in a reasonable and predictable  amount of time.     Precautions / Restrictions Precautions Precautions: Fall;Other (comment) Precaution Comments: monitor O2, watch HR Restrictions Weight Bearing Restrictions: No      Mobility  Bed Mobility Overal bed mobility: Needs Assistance Bed Mobility: Supine to Sit     Supine to sit: Min assist     General bed mobility comments: Min handheld assist to pull to sit    Transfers Overall transfer level: Needs assistance Equipment used: Rolling walker (2 wheels) Transfers: Sit to/from Stand Sit to Stand: Supervision           General transfer comment: Supervision for safety    Ambulation/Gait Ambulation/Gait assistance: Min guard Gait Distance (Feet): 18 Feet Assistive device: Rolling walker (2 wheels) Gait Pattern/deviations: Step-through pattern       General Gait Details: cues to self-monitor for activity tolerance; walked in the roomon room air, and O2 sats dropped to 85%, recovered with time, restarting O2, and seated rest  Stairs            Wheelchair Mobility    Modified Rankin (Stroke Patients Only)       Balance Overall balance assessment: Mild deficits observed, not formally tested                                           Pertinent Vitals/Pain Pain Assessment Pain Assessment: Faces Faces Pain Scale: No hurt Pain Intervention(s): Monitored during session    Home Living Family/patient expects to be discharged to:: Private residence Living Arrangements: Alone Available Help at Discharge: Family;Available PRN/intermittently Type of Home: House Home Access: Level entry  Home Layout: One level Home Equipment: Conservation officer, nature (2 wheels);Rollator (4 wheels);Toilet riser;BSC/3in1;Shower seat;Grab bars - tub/shower Additional Comments: Daughter works close by and calls 2-3 times/day, pt son lives in [place]    Prior Function Prior Level of Function : Needs assist             Mobility Comments: uses  Rollator, keeps cell phone on the seat ADLs Comments: cooks Healthy choice or soup/salads, pt family brings meals intermittently, does laundry. MOD I for ADLs     Hand Dominance   Dominant Hand: Right    Extremity/Trunk Assessment   Upper Extremity Assessment Upper Extremity Assessment: Defer to OT evaluation    Lower Extremity Assessment Lower Extremity Assessment: Generalized weakness    Cervical / Trunk Assessment Cervical / Trunk Assessment: Kyphotic  Communication   Communication: HOH  Cognition Arousal/Alertness: Awake/alert Behavior During Therapy: WFL for tasks assessed/performed Overall Cognitive Status: Within Functional Limits for tasks assessed                                          General Comments      Exercises     Assessment/Plan    PT Assessment Patient needs continued PT services  PT Problem List Decreased activity tolerance;Decreased balance;Decreased mobility;Cardiopulmonary status limiting activity       PT Treatment Interventions DME instruction;Gait training;Functional mobility training;Therapeutic activities;Balance training;Patient/family education    PT Goals (Current goals can be found in the Care Plan section)  Acute Rehab PT Goals Patient Stated Goal: wants to feel confident about returning home PT Goal Formulation: With patient Time For Goal Achievement: 08/21/22 Potential to Achieve Goals: Good    Frequency Min 3X/week     Co-evaluation               AM-PAC PT "6 Clicks" Mobility  Outcome Measure Help needed turning from your back to your side while in a flat bed without using bedrails?: None Help needed moving from lying on your back to sitting on the side of a flat bed without using bedrails?: A Little Help needed moving to and from a bed to a chair (including a wheelchair)?: A Little Help needed standing up from a chair using your arms (e.g., wheelchair or bedside chair)?: A Little Help needed to  walk in hospital room?: A Little Help needed climbing 3-5 steps with a railing? : A Little 6 Click Score: 19    End of Session Equipment Utilized During Treatment: Gait belt;Oxygen Activity Tolerance: Patient tolerated treatment well Patient left: in chair;with call bell/phone within reach;with chair alarm set Nurse Communication: Mobility status PT Visit Diagnosis: Difficulty in walking, not elsewhere classified (R26.2)    Time: 1610-9604 PT Time Calculation (min) (ACUTE ONLY): 34 min   Charges:   PT Evaluation $PT Eval Moderate Complexity: 1 Mod PT Treatments $Gait Training: 8-22 mins        Roney Marion, [place] Office [phone]   Colletta [place] 08/07/2022, 3:40 PM

## 2022-08-07 NOTE — Progress Notes (Signed)
Patient in no respiratory distress at this time. BiPAP on standby. Vitals stable.

## 2022-08-07 NOTE — Progress Notes (Signed)
Physical Therapy Treatment Note  (Full PT eval to follow)  SATURATION QUALIFICATIONS: (This note is used to comply with regulatory documentation for home oxygen)  Patient Saturations on Room Air at Rest = 93%  Patient Saturations on Room Air while Ambulating = 85%  Patient Saturations on 2 Liters of oxygen while Ambulating = 92%  Please briefly explain why patient needs home oxygen: Patient requires supplemental oxygen to maintain oxygen saturations at acceptable, safe levels with physical activity.  Roney Marion, [place] Office [phone]

## 2022-08-07 NOTE — Evaluation (Signed)
Occupational Therapy Evaluation Patient Details Name: [name] MRN: [id number] DOB: [birth year] Today's Date: 08/07/2022   History of Present Illness Pt is a 89 y.o. F who presents 07/27/2022 for evaluation of new onset fibrillation. Significant PMH: CAD, HTN, HLD, colon CA, anemia.   Clinical Impression   PTA, pt lives alone, typically Modified Independent with ADLs, basic household IADLs and mobility with Rollator. Pt presents now with deficits in cardiopulmonary endurance and overall strength. Pt requires Min A for bed mobility, min guard for short distance mobility using RW - limited by SOB w/ activity. Pt requires Setup for UB ADL and no more than Min A for LB ADLs. Began discussing energy conservation strategies w/ pt with plans to further reinforce in next sessions. Anticipate good progress to return home with [place] followup. Pt reports her family may be able to provide increased assist as needed at DC.  SpO2 on 2 L O2 at rest 97%, on RA 94% at rest Desats to 89% on RA w/ activity, 2/4 DOE Recovers to 91% on RA w/ rest break though still SOB so replaced 2 L O2 with return to 94%. HR up to 110bpm.      Recommendations for follow up therapy are one component of a multi-disciplinary discharge planning process, led by the attending physician.  Recommendations may be updated based on patient status, additional functional criteria and insurance authorization.   Follow Up Recommendations  Home health OT     Assistance Recommended at Discharge Set up Supervision/Assistance  Patient can return home with the following A little help with bathing/dressing/bathroom;Assistance with cooking/housework;Assist for transportation    Functional Status Assessment  Patient has had a recent decline in their functional status and demonstrates the ability to make significant improvements in function in a reasonable and predictable amount of time.  Equipment Recommendations  None recommended by OT     Recommendations for Other Services       Precautions / Restrictions Precautions Precautions: Fall;Other (comment) Precaution Comments: monitor O2, watch HR Restrictions Weight Bearing Restrictions: No      Mobility Bed Mobility Overal bed mobility: Needs Assistance Bed Mobility: Supine to Sit, Sit to Supine     Supine to sit: Min assist Sit to supine: Min assist   General bed mobility comments: light trunk assist to sit EOB and Min A for BLE back into bed    Transfers Overall transfer level: Needs assistance Equipment used: Rolling walker (2 wheels) Transfers: Sit to/from Stand Sit to Stand: Supervision                  Balance Overall balance assessment: Mild deficits observed, not formally tested                                         ADL either performed or assessed with clinical judgement   ADL Overall ADL's : Needs assistance/impaired Eating/Feeding: Independent;Sitting   Grooming: Supervision/safety;Standing;Oral care Grooming Details (indicate cue type and reason): standing at sink, leaning on sink for support, limited by SOB Upper Body Bathing: Set up   Lower Body Bathing: Supervison/ safety;Sit to/from stand   Upper Body Dressing : Set up;Sitting   Lower Body Dressing: Minimal assistance Lower Body Dressing Details (indicate cue type and reason): assist for socks, does not wear at baseline Toilet Transfer: Min guard;Ambulation;Rolling walker (2 wheels)   Toileting- Clothing Manipulation and Hygiene: Minimal assistance;Sitting/lateral lean;Sit  to/from stand       Functional mobility during ADLs: Min guard;Rolling walker (2 wheels) General ADL Comments: Focus on energy conservation strategies, self monitoring of symptoms, and implementing rest breaks as needed. Discussed BSC use at night with pt reporting family can bring down from attic, having daughter stay the night initially to ensure doing well     Vision Ability to  See in Adequate Light: 0 Adequate Patient Visual Report: No change from baseline Vision Assessment?: No apparent visual deficits     Perception     Praxis      Pertinent Vitals/Pain Pain Assessment Pain Assessment: Faces Faces Pain Scale: No hurt Pain Intervention(s): Monitored during session     Hand Dominance Right   Extremity/Trunk Assessment Upper Extremity Assessment Upper Extremity Assessment: LUE deficits/detail;RUE deficits/detail RUE Deficits / Details: Shoulder flexion AROM WFL, tightness along upper traps LUE Deficits / Details: Shoulder flexion AROM to ~100 degrees, tightness along upper traps   Lower Extremity Assessment Lower Extremity Assessment: Defer to PT evaluation   Cervical / Trunk Assessment Cervical / Trunk Assessment: Kyphotic   Communication Communication Communication: HOH   Cognition Arousal/Alertness: Awake/alert Behavior During Therapy: WFL for tasks assessed/performed Overall Cognitive Status: Within Functional Limits for tasks assessed                                       General Comments       Exercises     Shoulder Instructions      Home Living Family/patient expects to be discharged to:: Private residence Living Arrangements: Alone Available Help at Discharge: Family;Available PRN/intermittently Type of Home: House Home Access: Level entry     Home Layout: One level     Bathroom Shower/Tub: Occupational psychologist: Handicapped height (riser over her toilet)     Home Equipment: Conservation officer, nature (2 wheels);Rollator (4 wheels);Toilet riser;BSC/3in1;Shower seat;Grab bars - tub/shower   Additional Comments: Daughter works close by and calls 2-3 times/day, pt son lives in [place]      Prior Functioning/Environment Prior Level of Function : Needs assist             Mobility Comments: uses Rollator, keeps cell phone on the seat ADLs Comments: cooks Healthy choice or soup/salads, pt family  brings meals intermittently, does laundry. MOD I for ADLs        OT Problem List: Decreased strength;Decreased activity tolerance;Impaired balance (sitting and/or standing);Cardiopulmonary status limiting activity      OT Treatment/Interventions: Self-care/ADL training;Therapeutic exercise;Energy conservation;DME and/or AE instruction;Therapeutic activities;Patient/family education    OT Goals(Current goals can be found in the care plan section) Acute Rehab OT Goals Patient Stated Goal: regain strength and feel better OT Goal Formulation: With patient Time For Goal Achievement: 08/21/22 Potential to Achieve Goals: Good  OT Frequency: Min 2X/week    Co-evaluation              AM-PAC OT "6 Clicks" Daily Activity     Outcome Measure Help from another person eating meals?: None Help from another person taking care of personal grooming?: A Little Help from another person toileting, which includes using toliet, bedpan, or urinal?: A Little Help from another person bathing (including washing, rinsing, drying)?: A Little Help from another person to put on and taking off regular upper body clothing?: A Little Help from another person to put on and taking off regular lower body clothing?: A  Little 6 Click Score: 19   End of Session Equipment Utilized During Treatment: Gait belt;Rolling walker (2 wheels);Oxygen Nurse Communication: Mobility status;Other (comment) (O2)  Activity Tolerance: Patient tolerated treatment well Patient left: in bed;with call bell/phone within reach;with bed alarm set  OT Visit Diagnosis: Unsteadiness on feet (R26.81);Other abnormalities of gait and mobility (R26.89)                Time: 3762-8315 OT Time Calculation (min): 27 min Charges:  OT General Charges $OT Visit: 1 Visit OT Evaluation $OT Eval Low Complexity: 1 Low OT Treatments $Self Care/Home Management : 8-22 mins  Malachy Chamber, OTR/L Acute Rehab Services Office: [phone]   Layla Maw 08/07/2022, 11:34 AM

## 2022-08-07 NOTE — Progress Notes (Signed)
PROGRESS NOTE        PATIENT DETAILS Name: [name] Age: 89 y.o. Sex: female Date of Birth: 1927/11/17 Admit Date: 08/06/2022 Admitting Physician Marcelyn Bruins, MD OAC:ZYSA, Siri Cole, MD  Brief Summary: Patient is a 89 y.o.  female with history of chronic HFpEF, CAD, HLD, HTN recent hospitalization for new onset A-fib with RVR-presenting with acute hypoxic respiratory failure due to HFpEF exacerbation and A-fib RVR.  Significant events: 2/5>> admit to [facility]-hypoxia requiring BiPAP due to CHF exacerbation.  Significant studies: 1/28>> echo: EF 63-01%, grade 2 diastolic dysfunction, moderate MR 2/05>> CXR: Pulmonary edema/small right pleural effusion.  Significant microbiology data: 2/5>> respiratory virus panel: Negative 2/5>> COVID/influenza/RSV PCR: Negative  Procedures: None  Consults: None  Subjective: Overall much better-some cough-on 2 L of oxygen this morning.  Objective: Vitals: Blood pressure 120/69, pulse (!) 101, temperature 97.6 F (36.4 C), temperature source Oral, resp. rate 19, height '4\' 11"'$  (1.499 m), weight 68.2 kg, SpO2 96 %.   Exam: Gen Exam:Alert awake-not in any distress HEENT:atraumatic, normocephalic Chest: Bibasilar rales. CVS:S1S2 irregular Abdomen:soft non tender, non distended Extremities:no edema Neurology: Non focal Skin: no rash  Pertinent Labs/Radiology:    Latest Ref Rng & Units 08/07/2022    4:57 AM 08/06/2022    9:53 AM 08/06/2022    9:52 AM  CBC  WBC 4.0 - 10.5 K/uL 11.3     Hemoglobin 12.0 - 15.0 g/dL 10.6  11.2  10.9   Hematocrit 36.0 - 46.0 % 31.6  33.0  32.0   Platelets 150 - 400 K/uL 264       Lab Results  Component Value Date   NA 139 08/07/2022   K 3.7 08/07/2022   CL 106 08/07/2022   CO2 21 (L) 08/07/2022      Assessment/Plan: Acute hypoxic respiratory failure due to HFpEF exacerbation Liberated off BiPAP yesterday evening Currently stable on 2-3 L of oxygen via [place] No  peripheral edema but still has some bibasilar rales-repeat another dose of IV Lasix today Oxygen as tolerated  Persistent A-fib with RVR Remains in A-fib Rate better controlled Continue oral Cardizem-I have asked nurse to titrate off Cardizem infusion Continue Eliquis.  CAD-s/p last PCI to RCA 2014 No anginal symptoms  Moderate mitral regurgitation Stable for outpatient follow-up with primary cardiologist  Bronchial asthma Doubt exacerbation-ruled out Continue bronchodilators  History of colon cancer S/p colectomy/resection 2013 Outpatient follow-up with PCP/GI for surveillance as previously scheduled  Hard of hearing Very hard of hearing-in spite of having hearing aids  Obesity: Estimated body mass index is 30.37 kg/m as calculated from the following:   Height as of this encounter: '4\' 11"'$  (1.499 m).   Weight as of this encounter: 68.2 kg.   Code status:   Code Status: Full Code   DVT Prophylaxis: apixaban (ELIQUIS) tablet 5 mg    Family Communication: Daughter at bedside   Disposition Plan: Status is: Inpatient Remains inpatient appropriate because: Severity of illness   Planned Discharge Destination:Home health   Diet: Diet Order             Diet Heart Room service appropriate? Yes; Fluid consistency: Thin; Fluid restriction: 1800 mL Fluid  Diet effective now                     Antimicrobial agents: Anti-infectives (From admission, onward)  None        MEDICATIONS: Scheduled Meds:  apixaban  5 mg Oral BID   diltiazem  240 mg Oral Daily   furosemide  40 mg Intravenous Once   [START ON 08/08/2022] furosemide  40 mg Oral Daily   potassium chloride  40 mEq Oral Once   sodium chloride flush  3 mL Intravenous Q12H   Continuous Infusions:  diltiazem (CARDIZEM) infusion 5 mg/hr (08/06/22 1526)   PRN Meds:.acetaminophen **OR** acetaminophen, ipratropium-albuterol, melatonin, polyethylene glycol   I have personally reviewed following labs  and imaging studies  LABORATORY DATA: CBC: Recent Labs  Lab 08/06/22 0945 08/06/22 0952 08/06/22 0953 08/07/22 0457  WBC 10.6*  --   --  11.3*  HGB 11.4* 10.9* 11.2* 10.6*  HCT 34.6* 32.0* 33.0* 31.6*  MCV 96.9  --   --  94.0  PLT 269  --   --  676    Basic Metabolic Panel: Recent Labs  Lab 08/06/22 0945 08/06/22 0952 08/06/22 0953 08/07/22 0457  NA 139 139 140 139  K 3.5 3.5 3.5 3.7  CL 107  --  110 106  CO2 20*  --   --  21*  GLUCOSE 206*  --  198* 187*  BUN 16  --  23 21  CREATININE 0.90  --  0.80 0.96  CALCIUM 9.5  --   --  9.5  MG 5.2*  --   --   --     GFR: Estimated Creatinine Clearance: 31.4 mL/min (by C-G formula based on SCr of 0.96 mg/dL).  Liver Function Tests: Recent Labs  Lab 08/06/22 0945 08/07/22 0457  AST 34 23  ALT 22 20  ALKPHOS 50 47  BILITOT 1.8* 1.7*  PROT 7.0 6.6  ALBUMIN 3.7 3.5   No results for input(s): "LIPASE", "AMYLASE" in the last 168 hours. No results for input(s): "AMMONIA" in the last 168 hours.  Coagulation Profile: No results for input(s): "INR", "PROTIME" in the last 168 hours.  Cardiac Enzymes: No results for input(s): "CKTOTAL", "CKMB", "CKMBINDEX", "TROPONINI" in the last 168 hours.  BNP (last 3 results) No results for input(s): "PROBNP" in the last 8760 hours.  Lipid Profile: No results for input(s): "CHOL", "HDL", "LDLCALC", "TRIG", "CHOLHDL", "LDLDIRECT" in the last 72 hours.  Thyroid Function Tests: No results for input(s): "TSH", "T4TOTAL", "FREET4", "T3FREE", "THYROIDAB" in the last 72 hours.  Anemia Panel: No results for input(s): "VITAMINB12", "FOLATE", "FERRITIN", "TIBC", "IRON", "RETICCTPCT" in the last 72 hours.  Urine analysis:    Component Value Date/Time   COLORURINE YELLOW 09/15/2016 2104   APPEARANCEUR CLEAR 09/15/2016 2104   LABSPEC 1.017 09/15/2016 2104   PHURINE 5.0 09/15/2016 2104   GLUCOSEU NEGATIVE 09/15/2016 2104   HGBUR NEGATIVE 09/15/2016 2104   BILIRUBINUR NEGATIVE  09/15/2016 2104   KETONESUR NEGATIVE 09/15/2016 2104   PROTEINUR NEGATIVE 09/15/2016 2104   UROBILINOGEN 0.2 10/22/2012 1726   NITRITE NEGATIVE 09/15/2016 2104   LEUKOCYTESUR SMALL (A) 09/15/2016 2104    Sepsis Labs: Lactic Acid, Venous No results found for: "LATICACIDVEN"  MICROBIOLOGY: Recent Results (from the past 240 hour(s))  Resp panel by RT-PCR (RSV, Flu A&B, Covid) Anterior Nasal Swab     Status: None   Collection Time: 08/06/22  9:46 AM   Specimen: Anterior Nasal Swab  Result Value Ref Range Status   SARS Coronavirus 2 by RT PCR NEGATIVE NEGATIVE Final   Influenza A by PCR NEGATIVE NEGATIVE Final   Influenza B by PCR NEGATIVE NEGATIVE Final  Comment: (NOTE) The Xpert Xpress SARS-CoV-2/FLU/RSV plus assay is intended as an aid in the diagnosis of influenza from Nasopharyngeal swab specimens and should not be used as a sole basis for treatment. Nasal washings and aspirates are unacceptable for Xpert Xpress SARS-CoV-2/FLU/RSV testing.  Fact Sheet for Patients: EntrepreneurPulse.com.au  Fact Sheet for Healthcare Providers: IncredibleEmployment.be  This test is not yet approved or cleared by the [place] FDA and has been authorized for detection and/or diagnosis of SARS-CoV-2 by FDA under an Emergency Use Authorization (EUA). This EUA will remain in effect (meaning this test can be used) for the duration of the COVID-19 declaration under Section 564(b)(1) of the Act, 21 U.S.C. section 360bbb-3(b)(1), unless the authorization is terminated or revoked.     Resp Syncytial Virus by PCR NEGATIVE NEGATIVE Final    Comment: (NOTE) Fact Sheet for Patients: EntrepreneurPulse.com.au  Fact Sheet for Healthcare Providers: IncredibleEmployment.be  This test is not yet approved or cleared by the [place] FDA and has been authorized for detection and/or diagnosis of SARS-CoV-2 by FDA under an  Emergency Use Authorization (EUA). This EUA will remain in effect (meaning this test can be used) for the duration of the COVID-19 declaration under Section 564(b)(1) of the Act, 21 U.S.C. section 360bbb-3(b)(1), unless the authorization is terminated or revoked.  Performed at [facility], [place] [address], [place], [place] [address]   Respiratory (~20 pathogens) panel by PCR     Status: None   Collection Time: 08/06/22  9:58 AM   Specimen: Nasopharyngeal Swab; Respiratory  Result Value Ref Range Status   Adenovirus NOT DETECTED NOT DETECTED Final   Coronavirus 229E NOT DETECTED NOT DETECTED Final    Comment: (NOTE) The Coronavirus on the Respiratory Panel, DOES NOT test for the novel  Coronavirus (2019 nCoV)    Coronavirus HKU1 NOT DETECTED NOT DETECTED Final   Coronavirus NL63 NOT DETECTED NOT DETECTED Final   Coronavirus OC43 NOT DETECTED NOT DETECTED Final   Metapneumovirus NOT DETECTED NOT DETECTED Final   Rhinovirus / Enterovirus NOT DETECTED NOT DETECTED Final   Influenza A NOT DETECTED NOT DETECTED Final   Influenza B NOT DETECTED NOT DETECTED Final   Parainfluenza Virus 1 NOT DETECTED NOT DETECTED Final   Parainfluenza Virus 2 NOT DETECTED NOT DETECTED Final   Parainfluenza Virus 3 NOT DETECTED NOT DETECTED Final   Parainfluenza Virus 4 NOT DETECTED NOT DETECTED Final   Respiratory Syncytial Virus NOT DETECTED NOT DETECTED Final   Bordetella pertussis NOT DETECTED NOT DETECTED Final   Bordetella Parapertussis NOT DETECTED NOT DETECTED Final   Chlamydophila pneumoniae NOT DETECTED NOT DETECTED Final   Mycoplasma pneumoniae NOT DETECTED NOT DETECTED Final    Comment: Performed at [facility], [place]. [address] [place], [place] [address]    RADIOLOGY STUDIES/RESULTS: DG Chest Port 1 View  Result Date: 08/06/2022 CLINICAL DATA:  dyspnea EXAM: PORTABLE CHEST 1 VIEW COMPARISON:  July 27, 2022, September 15, 2016 FINDINGS: The cardiomediastinal silhouette is  unchanged in contour. Query small RIGHT pleural effusion. No pneumothorax. Peribronchial cuffing with mild increased bibasilar reticulation. LEFT retrocardiac opacity, favored atelectasis. IMPRESSION: Constellation of findings are favored to reflect mild pulmonary edema with possible small RIGHT pleural effusion. Electronically Signed   By: Valentino Saxon M.D.   On: 08/06/2022 11:00     LOS: 1 day   Oren Binet, MD  [facility]    To contact the attending provider between 7A-7P or the covering provider during after hours 7P-7A, please  log into the web site [email or web address] and access using universal [place] password for that web site. If you do not have the password, please call the hospital operator.  08/07/2022, 9:35 AM

## 2022-08-08 ENCOUNTER — Other Ambulatory Visit (HOSPITAL_COMMUNITY): Payer: Self-pay

## 2022-08-08 DIAGNOSIS — J9601 Acute respiratory failure with hypoxia: Secondary | ICD-10-CM | POA: Diagnosis not present

## 2022-08-08 DIAGNOSIS — I5033 Acute on chronic diastolic (congestive) heart failure: Secondary | ICD-10-CM | POA: Diagnosis not present

## 2022-08-08 DIAGNOSIS — I10 Essential (primary) hypertension: Secondary | ICD-10-CM | POA: Diagnosis not present

## 2022-08-08 DIAGNOSIS — J810 Acute pulmonary edema: Secondary | ICD-10-CM | POA: Diagnosis not present

## 2022-08-08 LAB — BASIC METABOLIC PANEL
Anion gap: 11 (ref 5–15)
BUN: 37 mg/dL — ABNORMAL HIGH (ref 8–23)
CO2: 23 mmol/L (ref 22–32)
Calcium: 9.5 mg/dL (ref 8.9–10.3)
Chloride: 104 mmol/L (ref 98–111)
Creatinine, Ser: 1.1 mg/dL — ABNORMAL HIGH (ref 0.44–1.00)
GFR, Estimated: 47 mL/min — ABNORMAL LOW (ref >60)
Glucose, Bld: 155 mg/dL — ABNORMAL HIGH (ref 70–99)
Potassium: 4 mmol/L (ref 3.5–5.1)
Sodium: 138 mmol/L (ref 135–145)

## 2022-08-08 LAB — MAGNESIUM: Magnesium: 2.1 mg/dL (ref 1.7–2.4)

## 2022-08-08 MED ORDER — DILTIAZEM HCL ER COATED BEADS 300 MG PO CP24
300.0000 mg | ORAL_CAPSULE | Freq: Every day | ORAL | 1 refills | Status: AC
  Filled 2022-08-08: qty 30, 30d supply, fill #0

## 2022-08-08 MED ORDER — POTASSIUM CHLORIDE CRYS ER 10 MEQ PO TBCR
20.0000 meq | EXTENDED_RELEASE_TABLET | Freq: Every day | ORAL | 0 refills | Status: DC
  Filled 2022-08-08: qty 30, 15d supply, fill #0

## 2022-08-08 MED ORDER — FUROSEMIDE 40 MG PO TABS
40.0000 mg | ORAL_TABLET | Freq: Every day | ORAL | 1 refills | Status: DC
  Filled 2022-08-08: qty 30, 30d supply, fill #0

## 2022-08-08 NOTE — Progress Notes (Addendum)
Physical Therapy Treatment Patient Details Name: [name] MRN: [id number] DOB: 1928/01/04 Today's Date: 08/08/2022   History of Present Illness Pt is a 89 y.o. F who presents 07/27/2022 for evaluation of new onset fibrillation. Significant PMH: CAD, HTN, HLD, colon CA, anemia.    PT Comments    Pt tolerated today's session well, focusing on endurance and monitoring energy levels with mobility. Pt found on room air, attempting ambulation on room air but pt desatting to ~85%, noting mild increase in WOB. Pt able to recover with a seated rest break with 2L O2 [place] applied to continue mobilizing in room. Pt maintaining SPO2 at or above 88% on 2L [place], cueing pt to take rest breaks as needed and pt requesting to return to rest. Educated pt on sitting in chair and pt agreeable. Pt will continue to benefit from skilled acute PT to progress mobility, discharge plan remains appropriate.    Recommendations for follow up therapy are one component of a multi-disciplinary discharge planning process, led by the attending physician.  Recommendations may be updated based on patient status, additional functional criteria and insurance authorization.  Follow Up Recommendations  Home health PT     Assistance Recommended at Discharge Intermittent Supervision/Assistance  Patient can return home with the following Assistance with cooking/housework;Assist for transportation   Equipment Recommendations  None recommended by PT (well-equipped)    Recommendations for Other Services       Precautions / Restrictions Precautions Precautions: Fall;Other (comment) Precaution Comments: monitor O2, watch HR Restrictions Weight Bearing Restrictions: No     Mobility  Bed Mobility               General bed mobility comments: Pt seated in chair upon arrival, left in chair at end of session    Transfers Overall transfer level: Needs assistance Equipment used: Rolling walker (2 wheels) Transfers: Sit  to/from Stand Sit to Stand: Supervision           General transfer comment: Supervision for safety    Ambulation/Gait Ambulation/Gait assistance: Min guard Gait Distance (Feet): 18 Feet (x3, x23. Pt with three trials with seated rest breaks between trials) Assistive device: Rolling walker (2 wheels) Gait Pattern/deviations: Step-through pattern, Trunk flexed Gait velocity: decreased     General Gait Details: pt cued for endurance and activity tolerance. Pt found on room air, ambulating in room but desatting, 2L applied and able to maintain during mobility. Pt cued for forward gaze as downward gaze noted   Stairs             Wheelchair Mobility    Modified Rankin (Stroke Patients Only)       Balance Overall balance assessment: Mild deficits observed, not formally tested                                          Cognition Arousal/Alertness: Awake/alert Behavior During Therapy: WFL for tasks assessed/performed Overall Cognitive Status: Within Functional Limits for tasks assessed                                 General Comments: Pt pleasant throughout session        Exercises      General Comments General comments (skin integrity, edema, etc.): desatting to ~85% on room air, recovers with seated rest breaks to low 90s, requires  2L O2 [place] with ambulation to maintain 88% or higher      Pertinent Vitals/Pain Pain Assessment Pain Assessment: No/denies pain    Home Living                          Prior Function            PT Goals (current goals can now be found in the care plan section) Acute Rehab PT Goals Patient Stated Goal: wants to feel confident about returning home PT Goal Formulation: With patient Time For Goal Achievement: 08/21/22 Potential to Achieve Goals: Good Progress towards PT goals: Progressing toward goals    Frequency    Min 3X/week      PT Plan Current plan remains appropriate     Co-evaluation              AM-PAC PT "6 Clicks" Mobility   Outcome Measure  Help needed turning from your back to your side while in a flat bed without using bedrails?: None Help needed moving from lying on your back to sitting on the side of a flat bed without using bedrails?: A Little Help needed moving to and from a bed to a chair (including a wheelchair)?: A Little Help needed standing up from a chair using your arms (e.g., wheelchair or bedside chair)?: A Little Help needed to walk in hospital room?: A Little Help needed climbing 3-5 steps with a railing? : A Little 6 Click Score: 19    End of Session Equipment Utilized During Treatment: Oxygen Activity Tolerance: Patient tolerated treatment well Patient left: in chair;with call bell/phone within reach;with family/visitor present;with chair alarm set Nurse Communication: Mobility status PT Visit Diagnosis: Difficulty in walking, not elsewhere classified (R26.2)     Time: 0102-7253 PT Time Calculation (min) (ACUTE ONLY): 24 min  Charges:  $Gait Training: 8-22 mins $Therapeutic Activity: 8-22 mins                     Charlynne Cousins, PT DPT Acute Rehabilitation Services Office [phone]    Luvenia Heller 08/08/2022, 1:01 PM

## 2022-08-08 NOTE — Progress Notes (Signed)
SATURATION QUALIFICATIONS: (This note is used to comply with regulatory documentation for home oxygen)  Patient Saturations on Room Air at Rest = 93%  Patient Saturations on Room Air while Ambulating = 88%  Patient Saturations on 2 Liters of oxygen while Ambulating = 92%  Please briefly explain why patient needs home oxygen: pt gets short of breath while ambulating

## 2022-08-08 NOTE — Plan of Care (Signed)
  Problem: Education: Goal: Ability to demonstrate management of disease process will improve Outcome: Progressing Goal: Ability to verbalize understanding of medication therapies will improve Outcome: Progressing   Problem: Activity: Goal: Capacity to carry out activities will improve Outcome: Progressing   Problem: Clinical Measurements: Goal: Respiratory complications will improve Outcome: Progressing Goal: Cardiovascular complication will be avoided Outcome: Progressing   Problem: Clinical Measurements: Goal: Cardiovascular complication will be avoided Outcome: Progressing   Problem: Coping: Goal: Level of anxiety will decrease Outcome: Progressing

## 2022-08-08 NOTE — Discharge Summary (Signed)
PATIENT DETAILS Name: [name] Age: 89 y.o. Sex: female Date of Birth: 24-Dec-1927 MRN: [id number]. Admitting Physician: Marcelyn Bruins, MD PPI:RJJO, Siri Cole, MD  Admit Date: 08/06/2022 Discharge date: 08/08/2022  Recommendations for Outpatient Follow-up:  Follow up with PCP in 1-2 weeks Please obtain CMP/CBC in one week  Admitted From:  Home  Disposition: Home health   Discharge Condition: good  CODE STATUS:   Code Status: Full Code   Diet recommendation:  Diet Order             Diet - low sodium heart healthy           Diet Heart Room service appropriate? Yes; Fluid consistency: Thin; Fluid restriction: 1800 mL Fluid  Diet effective now                    Brief Summary: Patient is a 89 y.o.  female with history of chronic HFpEF, CAD, HLD, HTN recent hospitalization for new onset A-fib with RVR-presenting with acute hypoxic respiratory failure due to HFpEF exacerbation and A-fib RVR.   Significant events: 2/5>> admit to [facility]-hypoxia requiring BiPAP due to CHF exacerbation.   Significant studies: 1/28>> echo: EF 84-16%, grade 2 diastolic dysfunction, moderate MR 2/05>> CXR: Pulmonary edema/small right pleural effusion.   Significant microbiology data: 2/5>> respiratory virus panel: Negative 2/5>> COVID/influenza/RSV PCR: Negative   Procedures: None   Consults: None  Brief Hospital Course: Acute hypoxic respiratory failure due to HFpEF exacerbation Initially required BiPAP-then transition to nasal cannula.  On room air this morning Overall much improved-euvolemic Has been already transition to oral Lasix Stable for discharge-continue diuretics on discharge (diuretics were held during her most recent hospitalization)   Persistent A-fib with RVR Remains in A-fib Briefly on Cardizem infusion Now controlled on oral medications Remains on Eliquis Per daughter-has an appointment coming up in a few days with cardiology-she has been asked to  keep this appointment.   CAD-s/p last PCI to RCA 2014 No anginal symptoms   Moderate mitral regurgitation Stable for outpatient follow-up with primary cardiologist   Bronchial asthma Doubt exacerbation-ruled out Continue bronchodilators   History of colon cancer S/p colectomy/resection 2013 Outpatient follow-up with PCP/GI for surveillance as previously scheduled   Hard of hearing Very hard of hearing-in spite of having hearing aids   Obesity: Estimated body mass index is 30.37 kg/m as calculated from the following:   Height as of this encounter: '4\' 11"'$  (1.499 m).   Weight as of this encounter: 68.2 kg.     Discharge Diagnoses:  Principal Problem:   Acute respiratory failure with hypoxia (HCC) Active Problems:   Obesity (BMI 30-39.9)   CAD S/P PCI: OM2 PTCA 7/02, OM2 Stent 9/02, ISR 11/03 - PTCA; RCA DES 1 /14/14   S/P NSTEMI -- involving left circumflex coronary artery (OM2) In-stent Thrombosis   Essential hypertension   Atrial fibrillation with rapid ventricular response (HCC)   Acute on chronic diastolic CHF (congestive heart failure) (HCC)   Asthma, chronic, unspecified asthma severity, with acute exacerbation   Discharge Instructions:  Activity:  As tolerated    Discharge Instructions     (HEART FAILURE PATIENTS) Call MD:  Anytime you have any of the following symptoms: 1) 3 pound weight gain in 24 hours or 5 pounds in 1 week 2) shortness of breath, with or without a dry hacking cough 3) swelling in the hands, feet or stomach 4) if you have to sleep on extra pillows at night in order  to breathe.   Complete by: As directed    Call MD for:  difficulty breathing, headache or visual disturbances   Complete by: As directed    Diet - low sodium heart healthy   Complete by: As directed    Increase activity slowly   Complete by: As directed       Allergies as of 08/08/2022       Reactions   Brilinta [ticagrelor] Shortness Of Breath   Bleeding    Lipitor  [atorvastatin] Shortness Of Breath, Other (See Comments)   Myalgias    Plavix [clopidogrel] Shortness Of Breath, Other (See Comments)   Bleeding    Codeine Nausea And Vomiting   Mobic [meloxicam] Other (See Comments)   Stomach upset   Paxil [paroxetine] Other (See Comments)   Stomach upset Myalgias  Chills    Sulfa Antibiotics Diarrhea, Other (See Comments)   Stomach upset   Sulfonamide Derivatives Diarrhea, Other (See Comments)   Stomach upset        Medication List     TAKE these medications    albuterol 108 (90 Base) MCG/ACT inhaler Commonly known as: VENTOLIN HFA Inhale 2 puffs into the lungs every 6 (six) hours as needed for wheezing.   apixaban 5 MG Tabs tablet Commonly known as: ELIQUIS Take 1 tablet (5 mg total) by mouth 2 (two) times daily.   diltiazem 300 MG 24 hr capsule Commonly known as: CARDIZEM CD Take 1 capsule (300 mg total) by mouth daily. What changed:  medication strength how much to take   diphenhydrAMINE 25 MG tablet Commonly known as: BENADRYL Take 25 mg by mouth 2 (two) times daily as needed for allergies.   fluticasone 50 MCG/ACT nasal spray Commonly known as: FLONASE Place 1 spray into the nose daily as needed for allergies.   furosemide 40 MG tablet Commonly known as: LASIX Take 1 tablet (40 mg total) by mouth daily. Start taking on: August 09, 2022   LORazepam 0.5 MG tablet Commonly known as: ATIVAN Take 0.5 mg by mouth 2 (two) times daily as needed for anxiety.   Melatonin 5 MG Chew Chew 5 mg by mouth at bedtime as needed (sleep).   multivitamin tablet Take 1 tablet by mouth daily.   nitroGLYCERIN 0.4 MG SL tablet Commonly known as: NITROSTAT DISSOLVE 1 TABLET UNDER THE TONGUE EVERY 5 MINUTES AS NEEDED FOR CHEST PAIN   potassium chloride 10 MEQ tablet Commonly known as: KLOR-CON M Take 2 tablets (20 mEq total) by mouth daily. Start taking on: August 09, 2022        Follow-up Information     Charlane Ferretti, MD.  Schedule an appointment as soon as possible for a visit in 1 week(s).   Specialty: Internal Medicine Contact information: [address] [place] [address] [phone]         Lendon Colonel, NP Follow up on 08/10/2022.   Specialties: Cardiology, Radiology, Cardiology Why: appt at 2:20 pm Contact information: [address] [place] [address] [phone]                Allergies  Allergen Reactions   Brilinta [Ticagrelor] Shortness Of Breath    Bleeding    Lipitor [Atorvastatin] Shortness Of Breath and Other (See Comments)    Myalgias    Plavix [Clopidogrel] Shortness Of Breath and Other (See Comments)    Bleeding    Codeine Nausea And Vomiting   Mobic [Meloxicam] Other (See Comments)  Stomach upset   Paxil [Paroxetine] Other (See Comments)    Stomach upset Myalgias  Chills    Sulfa Antibiotics Diarrhea and Other (See Comments)    Stomach upset   Sulfonamide Derivatives Diarrhea and Other (See Comments)    Stomach upset     Other Procedures/Studies: DG Chest Port 1 View  Result Date: 08/06/2022 CLINICAL DATA:  dyspnea EXAM: PORTABLE CHEST 1 VIEW COMPARISON:  July 27, 2022, September 15, 2016 FINDINGS: The cardiomediastinal silhouette is unchanged in contour. Query small RIGHT pleural effusion. No pneumothorax. Peribronchial cuffing with mild increased bibasilar reticulation. LEFT retrocardiac opacity, favored atelectasis. IMPRESSION: Constellation of findings are favored to reflect mild pulmonary edema with possible small RIGHT pleural effusion. Electronically Signed   By: Valentino Saxon M.D.   On: 08/06/2022 11:00   ECHOCARDIOGRAM COMPLETE  Result Date: 07/29/2022    ECHOCARDIOGRAM REPORT   Patient Name:   ASHARIA LOTTER Date of Exam: 07/29/2022 Medical Rec #:  [id number]      Height:       59.0 in Accession #:    [id number]     Weight:       144.8 lb Date of Birth:  December 30, 1927      BSA:          1.608 m Patient Age:    37  years       BP:           146/96 mmHg Patient Gender: F              HR:           109 bpm. Exam Location:  Inpatient Procedure: 2D Echo, Cardiac Doppler and Color Doppler Indications:    I48.1 Persistent atrial fibrillation  History:        Patient has prior history of Echocardiogram examinations. CAD;                 Risk Factors:Hypertension and Dyslipidemia.  Sonographer:    Phineas Douglas Referring Phys: [id number] [place]  1. Left ventricular ejection fraction, by estimation, is 60 to 65%. The left ventricle has normal function. The left ventricle has no regional wall motion abnormalities. Left ventricular diastolic parameters are consistent with Grade II diastolic dysfunction (pseudonormalization). Elevated left atrial pressure.  2. Right ventricular systolic function is normal. The right ventricular size is normal.  3. The mitral valve is normal in structure. Moderate mitral valve regurgitation. No evidence of mitral stenosis. Moderate mitral annular calcification.  4. The aortic valve is tricuspid. There is mild thickening of the aortic valve. Aortic valve regurgitation is not visualized. Aortic valve sclerosis is present, with no evidence of aortic valve stenosis.  5. The inferior vena cava is normal in size with greater than 50% respiratory variability, suggesting right atrial pressure of 3 mmHg. Comparison(s): During the study the rhythm is not atrial fibrillation, but sinus rhythm with very frequent PACs and frequent brief episodes of atrial tachycardia. FINDINGS  Left Ventricle: Left ventricular ejection fraction, by estimation, is 60 to 65%. The left ventricle has normal function. The left ventricle has no regional wall motion abnormalities. The left ventricular internal cavity size was normal in size. There is  borderline concentric left ventricular hypertrophy. Left ventricular diastolic parameters are consistent with Grade II diastolic dysfunction (pseudonormalization). Elevated left  atrial pressure. Right Ventricle: The right ventricular size is normal. No increase in right ventricular wall thickness. Right ventricular systolic function is normal. Left Atrium: Left atrial size was normal  in size. Right Atrium: Right atrial size was normal in size. Pericardium: There is no evidence of pericardial effusion. Mitral Valve: The mitral valve is normal in structure. Moderate mitral annular calcification. Moderate mitral valve regurgitation, with centrally-directed jet. No evidence of mitral valve stenosis. Tricuspid Valve: The tricuspid valve is normal in structure. Tricuspid valve regurgitation is not demonstrated. No evidence of tricuspid stenosis. Aortic Valve: The aortic valve is tricuspid. There is mild thickening of the aortic valve. Aortic valve regurgitation is not visualized. Aortic valve sclerosis is present, with no evidence of aortic valve stenosis. Pulmonic Valve: The pulmonic valve was normal in structure. Pulmonic valve regurgitation is mild. No evidence of pulmonic stenosis. Aorta: The aortic root is normal in size and structure. Venous: The inferior vena cava is normal in size with greater than 50% respiratory variability, suggesting right atrial pressure of 3 mmHg. IAS/Shunts: No atrial level shunt detected by color flow Doppler.  LEFT VENTRICLE PLAX 2D LVIDd:         3.50 cm     Diastology LVIDs:         1.90 cm     LV e' medial:    5.77 cm/s LV PW:         1.10 cm     LV E/e' medial:  23.4 LV IVS:        1.10 cm     LV e' lateral:   10.80 cm/s LVOT diam:     1.80 cm     LV E/e' lateral: 12.5 LV SV:         59 LV SV Index:   37 LVOT Area:     2.54 cm  LV Volumes (MOD) LV vol d, MOD A2C: 62.2 ml LV vol d, MOD A4C: 63.4 ml LV vol s, MOD A2C: 21.9 ml LV vol s, MOD A4C: 18.7 ml LV SV MOD A2C:     40.3 ml LV SV MOD A4C:     63.4 ml LV SV MOD BP:      43.2 ml RIGHT VENTRICLE             IVC RV Basal diam:  3.40 cm     IVC diam: 2.00 cm RV S prime:     12.40 cm/s TAPSE (M-mode): 1.5 cm  LEFT ATRIUM             Index        RIGHT ATRIUM           Index LA diam:        3.30 cm 2.05 cm/m   RA Area:     11.60 cm LA Vol (A2C):   40.4 ml 25.13 ml/m  RA Volume:   24.60 ml  15.30 ml/m LA Vol (A4C):   38.7 ml 24.07 ml/m LA Biplane Vol: 40.5 ml 25.19 ml/m  AORTIC VALVE LVOT Vmax:   113.00 cm/s LVOT Vmean:  75.900 cm/s LVOT VTI:    0.231 m  AORTA Ao Root diam: 3.20 cm Ao Asc diam:  3.00 cm MITRAL VALVE MV Area (PHT): 3.42 cm     SHUNTS MV Decel Time: 222 msec     Systemic VTI:  0.23 m MV E velocity: 135.00 cm/s  Systemic Diam: 1.80 cm MV A velocity: 108.00 cm/s MV E/A ratio:  1.25 Dani Gobble Croitoru MD Electronically signed by Sanda Klein MD Signature Date/Time: 07/29/2022/3:04:25 PM    Final    DG Chest Port 1 View  Result Date: 07/27/2022 CLINICAL DATA:  AFib EXAM:  PORTABLE CHEST 1 VIEW COMPARISON:  Radiograph 07/27/2022 FINDINGS: Stable cardiomediastinal silhouette. Low lung volumes accentuate pulmonary vascularity. Aortic atherosclerotic calcification. Left basilar atelectasis. No pleural effusion or pneumothorax. Elevated right hemidiaphragm. Degenerative arthritis both shoulders. IMPRESSION: No active disease. Electronically Signed   By: Placido Sou M.D.   On: 07/27/2022 21:20     TODAY-DAY OF DISCHARGE:  Subjective:   [name] today has no headache,no chest abdominal pain,no new weakness tingling or numbness, feels much better wants to go home today.   Objective:   Blood pressure 139/70, pulse 91, temperature 97.7 F (36.5 C), temperature source Oral, resp. rate 17, height '4\' 11"'$  (1.499 m), weight 66.2 kg, SpO2 91 %. No intake or output data in the 24 hours ending 08/08/22 1000 Filed Weights   08/06/22 0942 08/07/22 0500 08/08/22 0500  Weight: 63.5 kg 68.2 kg 66.2 kg    Exam: Awake Alert, Oriented *3, No new F.N deficits, Normal affect [place].AT,PERRAL Supple Neck,No JVD, No cervical lymphadenopathy appriciated.  Symmetrical Chest wall movement, Good air movement  bilaterally, CTAB RRR,No Gallops,Rubs or new Murmurs, No Parasternal Heave +ve B.Sounds, Abd Soft, Non tender, No organomegaly appriciated, No rebound -guarding or rigidity. No Cyanosis, Clubbing or edema, No new Rash or bruise   PERTINENT RADIOLOGIC STUDIES: DG Chest Port 1 View  Result Date: 08/06/2022 CLINICAL DATA:  dyspnea EXAM: PORTABLE CHEST 1 VIEW COMPARISON:  July 27, 2022, September 15, 2016 FINDINGS: The cardiomediastinal silhouette is unchanged in contour. Query small RIGHT pleural effusion. No pneumothorax. Peribronchial cuffing with mild increased bibasilar reticulation. LEFT retrocardiac opacity, favored atelectasis. IMPRESSION: Constellation of findings are favored to reflect mild pulmonary edema with possible small RIGHT pleural effusion. Electronically Signed   By: Valentino Saxon M.D.   On: 08/06/2022 11:00     PERTINENT LAB RESULTS: CBC: Recent Labs    08/06/22 0945 08/06/22 0952 08/06/22 0953 08/07/22 0457  WBC 10.6*  --   --  11.3*  HGB 11.4*   < > 11.2* 10.6*  HCT 34.6*   < > 33.0* 31.6*  PLT 269  --   --  264   < > = values in this interval not displayed.   CMET CMP     Component Value Date/Time   NA 138 08/08/2022 0635   K 4.0 08/08/2022 0635   CL 104 08/08/2022 0635   CO2 23 08/08/2022 0635   GLUCOSE 155 (H) 08/08/2022 0635   BUN 37 (H) 08/08/2022 0635   CREATININE 1.10 (H) 08/08/2022 0635   CALCIUM 9.5 08/08/2022 0635   PROT 6.6 08/07/2022 0457   ALBUMIN 3.5 08/07/2022 0457   AST 23 08/07/2022 0457   ALT 20 08/07/2022 0457   ALKPHOS 47 08/07/2022 0457   BILITOT 1.7 (H) 08/07/2022 0457   GFRNONAA 47 (L) 08/08/2022 0635   GFRAA 50 (L) 09/15/2016 1727    GFR Estimated Creatinine Clearance: 27 mL/min (A) (by C-G formula based on SCr of 1.1 mg/dL (H)). No results for input(s): "LIPASE", "AMYLASE" in the last 72 hours. No results for input(s): "CKTOTAL", "CKMB", "CKMBINDEX", "TROPONINI" in the last 72 hours. Invalid input(s): "POCBNP" No  results for input(s): "DDIMER" in the last 72 hours. No results for input(s): "HGBA1C" in the last 72 hours. No results for input(s): "CHOL", "HDL", "LDLCALC", "TRIG", "CHOLHDL", "LDLDIRECT" in the last 72 hours. No results for input(s): "TSH", "T4TOTAL", "T3FREE", "THYROIDAB" in the last 72 hours.  Invalid input(s): "FREET3" No results for input(s): "VITAMINB12", "FOLATE", "FERRITIN", "TIBC", "IRON", "RETICCTPCT" in the last 72  hours. Coags: No results for input(s): "INR" in the last 72 hours.  Invalid input(s): "PT" Microbiology: Recent Results (from the past 240 hour(s))  Resp panel by RT-PCR (RSV, Flu A&B, Covid) Anterior Nasal Swab     Status: None   Collection Time: 08/06/22  9:46 AM   Specimen: Anterior Nasal Swab  Result Value Ref Range Status   SARS Coronavirus 2 by RT PCR NEGATIVE NEGATIVE Final   Influenza A by PCR NEGATIVE NEGATIVE Final   Influenza B by PCR NEGATIVE NEGATIVE Final    Comment: (NOTE) The Xpert Xpress SARS-CoV-2/FLU/RSV plus assay is intended as an aid in the diagnosis of influenza from Nasopharyngeal swab specimens and should not be used as a sole basis for treatment. Nasal washings and aspirates are unacceptable for Xpert Xpress SARS-CoV-2/FLU/RSV testing.  Fact Sheet for Patients: EntrepreneurPulse.com.au  Fact Sheet for Healthcare Providers: IncredibleEmployment.be  This test is not yet approved or cleared by the [place] FDA and has been authorized for detection and/or diagnosis of SARS-CoV-2 by FDA under an Emergency Use Authorization (EUA). This EUA will remain in effect (meaning this test can be used) for the duration of the COVID-19 declaration under Section 564(b)(1) of the Act, 21 U.S.C. section 360bbb-3(b)(1), unless the authorization is terminated or revoked.     Resp Syncytial Virus by PCR NEGATIVE NEGATIVE Final    Comment: (NOTE) Fact Sheet for  Patients: EntrepreneurPulse.com.au  Fact Sheet for Healthcare Providers: IncredibleEmployment.be  This test is not yet approved or cleared by the [place] FDA and has been authorized for detection and/or diagnosis of SARS-CoV-2 by FDA under an Emergency Use Authorization (EUA). This EUA will remain in effect (meaning this test can be used) for the duration of the COVID-19 declaration under Section 564(b)(1) of the Act, 21 U.S.C. section 360bbb-3(b)(1), unless the authorization is terminated or revoked.  Performed at [facility], [place] [address], [place], [place] [address]   Respiratory (~20 pathogens) panel by PCR     Status: None   Collection Time: 08/06/22  9:58 AM   Specimen: Nasopharyngeal Swab; Respiratory  Result Value Ref Range Status   Adenovirus NOT DETECTED NOT DETECTED Final   Coronavirus 229E NOT DETECTED NOT DETECTED Final    Comment: (NOTE) The Coronavirus on the Respiratory Panel, DOES NOT test for the novel  Coronavirus (2019 nCoV)    Coronavirus HKU1 NOT DETECTED NOT DETECTED Final   Coronavirus NL63 NOT DETECTED NOT DETECTED Final   Coronavirus OC43 NOT DETECTED NOT DETECTED Final   Metapneumovirus NOT DETECTED NOT DETECTED Final   Rhinovirus / Enterovirus NOT DETECTED NOT DETECTED Final   Influenza A NOT DETECTED NOT DETECTED Final   Influenza B NOT DETECTED NOT DETECTED Final   Parainfluenza Virus 1 NOT DETECTED NOT DETECTED Final   Parainfluenza Virus 2 NOT DETECTED NOT DETECTED Final   Parainfluenza Virus 3 NOT DETECTED NOT DETECTED Final   Parainfluenza Virus 4 NOT DETECTED NOT DETECTED Final   Respiratory Syncytial Virus NOT DETECTED NOT DETECTED Final   Bordetella pertussis NOT DETECTED NOT DETECTED Final   Bordetella Parapertussis NOT DETECTED NOT DETECTED Final   Chlamydophila pneumoniae NOT DETECTED NOT DETECTED Final   Mycoplasma pneumoniae NOT DETECTED NOT DETECTED Final    Comment: Performed at  [facility], [place]. [address], [place], [place] [address]    FURTHER DISCHARGE INSTRUCTIONS:  Get Medicines reviewed and adjusted: Please take all your medications with you for your next visit with your Primary MD  Laboratory/radiological  data: Please request your Primary MD to go over all hospital tests and procedure/radiological results at the follow up, please ask your Primary MD to get all Hospital records sent to his/her office.  In some cases, they will be blood work, cultures and biopsy results pending at the time of your discharge. Please request that your primary care M.D. goes through all the records of your hospital data and follows up on these results.  Also Note the following: If you experience worsening of your admission symptoms, develop shortness of breath, life threatening emergency, suicidal or homicidal thoughts you must seek medical attention immediately by calling 911 or calling your MD immediately  if symptoms less severe.  You must read complete instructions/literature along with all the possible adverse reactions/side effects for all the Medicines you take and that have been prescribed to you. Take any new Medicines after you have completely understood and accpet all the possible adverse reactions/side effects.   Do not drive when taking Pain medications or sleeping medications (Benzodaizepines)  Do not take more than prescribed Pain, Sleep and Anxiety Medications. It is not advisable to combine anxiety,sleep and pain medications without talking with your primary care practitioner  Special Instructions: If you have smoked or chewed Tobacco  in the last 2 yrs please stop smoking, stop any regular Alcohol  and or any Recreational drug use.  Wear Seat belts while driving.  Please note: You were cared for by a hospitalist during your hospital stay. Once you are discharged, your primary care physician will handle any further medical issues. Please note that NO  REFILLS for any discharge medications will be authorized once you are discharged, as it is imperative that you return to your primary care physician (or establish a relationship with a primary care physician if you do not have one) for your post hospital discharge needs so that they can reassess your need for medications and monitor your lab values.  Total Time spent coordinating discharge including counseling, education and face to face time equals greater than 30 minutes.  Signed: Zulema [place] 08/08/2022 10:00 AM

## 2022-08-08 NOTE — TOC Transition Note (Signed)
Transition of Care [facility]) - CM/SW Discharge Note   Patient Details  Name: [name] MRN: [id number] Date of Birth: [birth year]  Transition of Care [facility]) CM/SW Contact:  Levonne Lapping, RN Phone Number: 08/08/2022, 11:30 AM   Clinical Narrative:    Patient to return home today.  Daughter will transport. Patient does live alone, but Daughter is in constant touch with her throughout the day. Home Health PT/OT has been ordered and will be provided by [facility]. Patient requires O2 and [employer] will be delivering portable to room and concentrator to patients home address . No additional TOC needs      Final next level of care: Home w Home Health Services Barriers to Discharge: No Barriers Identified   Patient Goals and CMS Choice CMS Medicare.gov Compare Post Acute Care list provided to:: Patient Choice offered to / list presented to : Patient, Adult Children  Discharge Placement                         Discharge Plan and Services Additional resources added to the After Visit Summary for                  DME Arranged: Oxygen   Date DME Agency Contacted: 08/08/22 Time DME Agency Contacted: 10 Representative spoke with at DME Agency: Brenton Grills HH Arranged: PT, OT [place] Agency: [employer] Date [place]: 08/08/22 Time [place]: 65 Representative spoke with at [place]: [place] Determinants of Health ([place]) Interventions SDOH Screenings   Food Insecurity: No Food Insecurity (08/06/2022)  Housing: Low Risk  (08/06/2022)  Transportation Needs: No Transportation Needs (08/06/2022)  Utilities: Not At Risk (08/06/2022)  Tobacco Use: Medium Risk (08/06/2022)     Readmission Risk Interventions     No data to display

## 2022-08-08 NOTE — Progress Notes (Signed)
Cardiology Clinic Note   Patient Name: [name] Date of Encounter: 08/10/2022  Primary Care Provider:  Charlane Ferretti, MD Primary Cardiologist:  Glenetta Hew, MD  Patient Profile    89 year old female with past medical history of coronary artery disease,Last cath was 2014 she had DES RCA with other vessels patent, last stress test 2016 and was normal  hypertension, hyperlipidemia, colon cancer, anemia , new onset atrial fibrillation CHADS VASC Score 5 with 7.2 % annual CVA risk.  Patient does have a history of coronary disease with previous PCI. Echocardiogram 07/29/2022 revealed EF of 60-65% with Grade II diastolic dysfunction, LA was normal in size.  Recent admission January 2024, and was found to have atrial fibrillation with RVR. She was cardiac unaware. Seen on consultation by Dr.Crenshaw. Started on Eliquis 5 mg BID and diltiazem CD, 240 mg daily, Metoprolol was discontinued.   Past Medical History    Past Medical History:  Diagnosis Date   Adenomatous colon polyp MAY 2013   HOSPITALIZED AT [facility] WITH HGB 7 -TRANSFUSED-MASS FOUND IN ASCENDING COLON   Aortic sclerosis    With murmur   Asthma    CAD S/P percutaneous coronary angioplasty 2002, 2003,2014   a) Unstable Angina 7/'02: 1st Cutting PTCA- OM2 --> restenosed 9/'02 NSTEMI--> 3.0 mm x 12 mm BMS-OM 2; b) 11/'03 DOE w/ + Cardiolite - Cutter PTCA for ISR -- patent in 2004 (after False + Cardiolite); c) 07/2012 - Unstable Angina -- PCI to proximal RCA with Promus Premier DES 2.75 mm x 20 mm (3.0 mm), patent OM2 stent ~10% ISR (HARDING)   Complication of anesthesia 2009   HIP REPLACEMENT-PT HAD HARD TIME WAKING UP--FELT LIKE SHE COULDN'T BREATHE   Coronary stent restenosis due to scar tissue 05/2002   Cutting Balloon PTCA to OM2 BMS   Diverticulosis 10/2012   Seen on Colonoscopy   DJD (degenerative joint disease), thoracolumbar    Back, Hips & Knees   Essential hypertension    H/O Colon cancer 10/2011   GI - Drs. Carlean Purl -  colonoscopy 10/2012: diverticulosis, stable ileocolic anastomosis of R colon   Hyperlipidemia with target LDL less than 70    Internal hemorrhoids    Iron deficiency anemia due to chronic blood loss 10/2011   Blood Transfusion (EGD 09/2012 - unremarkable)   Medication intolerance - Plavix, Effient, Brilinta    Easy bruising, GI bleeds (led to Dx of Colon CA), Brilinta - dyspnea   Multiple lung nodules on CT 08/2012   scattered, bilateral but right>left.    Non-Q wave ST elevation myocardial infarction (STEMI) involving left circumflex coronary artery 03/2001   a) Severe thrombic ISR of prior PTCA site in OM2 --> BMS PCI; b) Echo 08/2012: Normal LV size & function.  EF 55-60%, no regional WMA, Gr 1 DD, mild MR, mild-mod TR - NO Pulmonary HTN   Osteoarthritis (arthritis due to wear and tear of joints)    Bilateral Hip Arthroplasty, L Knee TKA   Osteoporosis    Past Surgical History:  Procedure Laterality Date   APPENDECTOMY     CHOLECYSTECTOMY     COLONOSCOPY  11/16/2011   Procedure: COLONOSCOPY;  Surgeon: Gatha Mayer, MD;  Location: [place];  Service: Endoscopy;  Laterality: N/A;   COLONOSCOPY  10/2012   Carlean Purl: diverticulosis, stable R colon ileocolic anastomosis   COLONOSCOPY W/ BIOPSIES AND POLYPECTOMY  06/15/2008   adenomatous polyps, diverticulosis, internal hemorrhoids   COLONOSCOPY WITH PROPOFOL N/A 07/18/2015   Procedure: COLONOSCOPY WITH PROPOFOL;  Surgeon: Gatha Mayer, MD;  Location: [facility];  Service: Endoscopy;  Laterality: N/A;   CORONARY ANGIOPLASTY WITH STENT PLACEMENT  2000-10-09; 2001-10-09; 07/2012   a)  7/'02: 1st Cutting PTCA- OM2 --> restenosed 9/'02 --> 3.0 mm x 12 mm BMS-OM 2; b) 11/'03 - Cutter PTCA for ISR -- patent in October 10, 2002;    [place] N/A 03/21/2022   Procedure: DILATATION & CURETTAGE/HYSTEROSCOPY WITH MYOSURE, PAP SMEAR;  Surgeon: Drema Dallas, DO;  Location: [place];  Service: Gynecology;  Laterality: N/A;    ESOPHAGOGASTRODUODENOSCOPY N/A 10/23/2012   Procedure: ESOPHAGOGASTRODUODENOSCOPY (EGD);  Surgeon: Inda Castle, MD;  Location: [place];  Service: Endoscopy;  Laterality: N/A;   INCISIONAL HERNIA REPAIR N/A 01/29/2013   Procedure: LAPAROSCOPIC INCISIONAL HERNIA;  Surgeon: Harl Bowie, MD;  Location: [facility];  Service: General;  Laterality: N/A;   INSERTION OF MESH N/A 01/29/2013   Procedure: INSERTION OF MESH;  Surgeon: Harl Bowie, MD;  Location: [facility];  Service: General;  Laterality: N/A;   LAPAROSCOPY  03/21/2022   Procedure: DIAGNOSTIC LAPAROSCOPY;  Surgeon: Drema Dallas, DO;  Location: [place];  Service: Gynecology;;   LEFT HEART CATHETERIZATION WITH CORONARY ANGIOGRAM N/A 07/15/2012   Procedure: LEFT HEART CATHETERIZATION WITH CORONARY ANGIOGRAM;  Surgeon: Leonie Man, MD;  Location: [facility] CATH LAB: pRCA 80-90%, OM2 BMS ~10% ISR   NM MYOVIEW LTD  2014-10-10    Normal LV size and function. Normal wall motion. LOW RISK.   PERCUTANEOUS CORONARY STENT INTERVENTION (PCI-S)  07/15/2012   Procedure: PERCUTANEOUS CORONARY STENT INTERVENTION (PCI-S);  Surgeon: Leonie Man, MD;  Location: [facility] CATH LAB: c) PCI to proximal RCA with Promus Premier DES 2.75 mm x 20 mm (3.0 mm)   REPLACEMENT TOTAL KNEE Left October 09, 2001   TOTAL HIP ARTHROPLASTY  09/08/2007   left, Dr. Wynelle Link   TOTAL HIP ARTHROPLASTY Right 09-Oct-2001   TRANSTHORACIC ECHOCARDIOGRAM  08/2012   Normal LV size & function.  EF 55-60%, no regional WMA, Gr 1 DD, mild MR, mild-mod TR; aortic sclerosis without stenosis    Allergies  Allergies  Allergen Reactions   Brilinta [Ticagrelor] Shortness Of Breath    Bleeding    Lipitor [Atorvastatin] Shortness Of Breath and Other (See Comments)    Myalgias    Plavix [Clopidogrel] Shortness Of Breath and Other (See Comments)    Bleeding    Codeine Nausea And Vomiting   Mobic [Meloxicam] Other (See Comments)    Stomach upset   Paxil [Paroxetine] Other (See Comments)    Stomach  upset Myalgias  Chills    Sulfa Antibiotics Diarrhea and Other (See Comments)    Stomach upset   Sulfonamide Derivatives Diarrhea and Other (See Comments)    Stomach upset    History of Present Illness    Mrs. Glas comes today with her daughter for posthospitalization follow-up after admission for dyspnea, A-fib RVR, asthma exacerbation.  Seen in cardiology consult by Dr. Stanford Breed placed on diltiazem, Eliquis, and was to remain on home O2 via nasal cannula and followed by pulmonary for ongoing lung disease.  The patient has become more more sedentary over the last 3 years, her daughter states that she used to be more active but has slowly spent more time in bed.  Her husband died in 10/10/19 and she has noted a significant decline in her overall health status since that time.  She is tolerating Eliquis, diltiazem, and denies any issues with bleeding, rapid heart rhythm, or lower extremity edema.  Home Medications    Current Outpatient Medications  Medication Sig Dispense Refill   albuterol (PROVENTIL HFA;VENTOLIN HFA) 108 (90 BASE) MCG/ACT inhaler Inhale 2 puffs into the lungs every 6 (six) hours as needed for wheezing.     apixaban (ELIQUIS) 5 MG TABS tablet Take 1 tablet (5 mg total) by mouth 2 (two) times daily. 60 tablet 1   diltiazem (CARDIZEM CD) 300 MG 24 hr capsule Take 1 capsule (300 mg total) by mouth daily. 30 capsule 1   diphenhydrAMINE (BENADRYL) 25 MG tablet Take 25 mg by mouth 2 (two) times daily as needed for allergies.     fluticasone (FLONASE) 50 MCG/ACT nasal spray Place 1 spray into the nose daily as needed for allergies.     furosemide (LASIX) 40 MG tablet Take 1 tablet (40 mg total) by mouth daily. 30 tablet 1   LORazepam (ATIVAN) 0.5 MG tablet Take 0.5 mg by mouth 2 (two) times daily as needed for anxiety.     Melatonin 5 MG CHEW Chew 5 mg by mouth at bedtime as needed (sleep).     Multiple Vitamin (MULTIVITAMIN) tablet Take 1 tablet by mouth daily.     potassium  chloride (KLOR-CON M) 10 MEQ tablet Take 2 tablets (20 mEq total) by mouth daily. 30 tablet 0   nitroGLYCERIN (NITROSTAT) 0.4 MG SL tablet Place 1 tablet (0.4 mg total) under the tongue every 5 (five) minutes as needed for chest pain. 25 tablet 3   No current facility-administered medications for this visit.     Family History    Family History  Problem Relation Age of Onset   Pancreatic cancer Father    Prostate cancer Father    Skin cancer Father    Heart disease Mother    Hypertension Mother    Pancreatic cancer Brother    Heart disease Sister    Colon cancer Brother        ? Colostomy   Prostate cancer Brother    Colon cancer Sister    Melanoma Brother    She indicated that her mother is deceased. She indicated that her father is deceased.  Social History    Social History   Socioeconomic History   Marital status: Married    Spouse name: Not on file   Number of children: 2   Years of education: Not on file   Highest education level: Not on file  Occupational History   Occupation: retired  Tobacco Use   Smoking status: Former    Years: 30.00    Types: Cigarettes    Quit date: 07/02/1982    Years since quitting: 40.1   Smokeless tobacco: Never  Vaping Use   Vaping Use: Never used  Substance and Sexual Activity   Alcohol use: Yes    Alcohol/week: 0.0 standard drinks of alcohol    Comment: occ wine   Drug use: No   Sexual activity: Not on file  Other Topics Concern   Not on file  Social History Narrative   Married, Mother of 2 adopted children. She has 3 grandchildren.   She worked as a Psychologist, occupational at [employer].   She quit smoking in 1984, and does not drink alcohol.   Social Determinants of Health   Financial Resource Strain: Not on file  Food Insecurity: No Food Insecurity (08/06/2022)   Hunger Vital Sign    Worried About Running Out of Food in the Last Year: Never true    Ran Out of Food in the  Last Year: Never true  Transportation Needs: No  Transportation Needs (08/06/2022)   PRAPARE - Hydrologist (Medical): No    Lack of Transportation (Non-Medical): No  Physical Activity: Not on file  Stress: Not on file  Social Connections: Not on file  Intimate Partner Violence: Not At Risk (08/06/2022)   Humiliation, Afraid, Rape, and Kick questionnaire    Fear of Current or Ex-Partner: No    Emotionally Abused: No    Physically Abused: No    Sexually Abused: No     Review of Systems    General:  No chills, fever, night sweats or weight changes.  Cardiovascular:  No chest pain, dyspnea on exertion, edema, orthopnea, palpitations, paroxysmal nocturnal dyspnea. Dermatological: No rash, lesions/masses Respiratory: No cough, dyspnea Urologic: No hematuria, dysuria Abdominal:   No nausea, vomiting, diarrhea, bright red blood per rectum, melena, or hematemesis Neurologic:  No visual changes, wkns, changes in mental status. All other systems reviewed and are otherwise negative except as noted above.     Physical Exam    VS:  BP (!) 140/71   Pulse 96   Ht 4' 11"$  (1.499 m)   Wt 138 lb (62.6 kg)   SpO2 99%   BMI 27.87 kg/m  , BMI Body mass index is 27.87 kg/m.     GEN: Well nourished, well developed, in no acute distress.  Thin, frail, HEENT: normal. Neck: Supple, no JVD, carotid bruits, or masses. Cardiac: IRRR, 1/6 holosystolic murmurs, rubs, or gallops. No clubbing, cyanosis, edema.  Radials/DP/PT 2+ and equal bilaterally.  Respiratory:  Respirations regular and unlabored, clear to auscultation bilaterally.,  Wearing oxygen via nasal cannula GI: Soft, nontender, nondistended, BS + x 4. MS: no deformity or atrophy. Skin: warm and dry, no rash.  Multiple areas of bruising. Neuro:  Strength and sensation are intact.  Hard of hearing. Psych: Normal affect.  Accessory Clinical Findings    ECG personally reviewed by me today-atrial fibrillation, ventricular rate of 96 bpm, with nonspecific ST-T wave  abnormalities- No acute changes  Lab Results  Component Value Date   WBC 11.3 (H) 08/07/2022   HGB 10.6 (L) 08/07/2022   HCT 31.6 (L) 08/07/2022   MCV 94.0 08/07/2022   PLT 264 08/07/2022   Lab Results  Component Value Date   CREATININE 1.10 (H) 08/08/2022   BUN 37 (H) 08/08/2022   NA 138 08/08/2022   K 4.0 08/08/2022   CL 104 08/08/2022   CO2 23 08/08/2022   Lab Results  Component Value Date   ALT 20 08/07/2022   AST 23 08/07/2022   ALKPHOS 47 08/07/2022   BILITOT 1.7 (H) 08/07/2022   Lab Results  Component Value Date   CHOL 122 10/23/2012   HDL 67 10/23/2012   LDLCALC 32 10/23/2012   TRIG 116 10/23/2012   CHOLHDL 1.8 10/23/2012    Lab Results  Component Value Date   HGBA1C 6.5 (H) 07/14/2012    Review of Prior Studies: Echocardiogram 07/29/2022 1. Left ventricular ejection fraction, by estimation, is 60 to 65%. The  left ventricle has normal function. The left ventricle has no regional  wall motion abnormalities. Left ventricular diastolic parameters are  consistent with Grade II diastolic  dysfunction (pseudonormalization). Elevated left atrial pressure.   2. Right ventricular systolic function is normal. The right ventricular  size is normal.   3. The mitral valve is normal in structure. Moderate mitral valve  regurgitation. No evidence of mitral stenosis. Moderate mitral annular  calcification.   4. The aortic valve is tricuspid. There is mild thickening of the aortic  valve. Aortic valve regurgitation is not visualized. Aortic valve  sclerosis is present, with no evidence of aortic valve stenosis.   5. The inferior vena cava is normal in size with greater than 50%  respiratory variability, suggesting right atrial pressure of 3 mmHg.   Assessment & Plan   1.  Persistent atrial fibrillation: Heart rate controlled on diltiazem 240 mg CD, CHA2DS2-VASc score 5.  On Eliquis 5 mg twice daily.  Creatinine 1.10; GFR 47.  She is not having any issues right now  and is cardiac aware concerning rapid heart rhythm.  Breathing status has been stable.  No changes in her medications at this time.  2.  Hypertension: Slightly elevated today, but will not make any medication changes at this time and follow-up.  With her age and fragility I do not want to increase antihypertensive medications by adjusting diltiazem at this time.  3.  Hyperlipidemia: Currently not on any statin therapy.  Defer to PCP.    Current medicines are reviewed at length with the patient today.  I have spent 25 min's  dedicated to the care of this patient on the date of this encounter to include pre-visit review of records, assessment, management and diagnostic testing,with shared decision making. Signed, Phill Myron. West Pugh, ANP, [place]   08/10/2022 3:36 PM      Office [phone] Fax [phone]  Notice: This dictation was prepared with Dragon dictation along with smaller phrase technology. Any transcriptional errors that result from this process are unintentional and may not be corrected upon review.

## 2022-08-08 NOTE — Plan of Care (Signed)
Discharge paperwork reviewed with client at this time. No further requests have been made. Iv has been removed.   Problem: Education: Goal: Ability to demonstrate management of disease process will improve Outcome: Adequate for Discharge Goal: Ability to verbalize understanding of medication therapies will improve Outcome: Adequate for Discharge Goal: Individualized Educational Video(s) Outcome: Adequate for Discharge   Problem: Activity: Goal: Capacity to carry out activities will improve Outcome: Adequate for Discharge   Problem: Cardiac: Goal: Ability to achieve and maintain adequate cardiopulmonary perfusion will improve Outcome: Adequate for Discharge   Problem: Education: Goal: Knowledge of General Education information will improve Description: Including pain rating scale, medication(s)/side effects and non-pharmacologic comfort measures Outcome: Adequate for Discharge   Problem: Health Behavior/Discharge Planning: Goal: Ability to manage health-related needs will improve Outcome: Adequate for Discharge   Problem: Clinical Measurements: Goal: Ability to maintain clinical measurements within normal limits will improve Outcome: Adequate for Discharge Goal: Will remain free from infection Outcome: Adequate for Discharge Goal: Diagnostic test results will improve Outcome: Adequate for Discharge Goal: Respiratory complications will improve Outcome: Adequate for Discharge Goal: Cardiovascular complication will be avoided Outcome: Adequate for Discharge   Problem: Activity: Goal: Risk for activity intolerance will decrease Outcome: Adequate for Discharge   Problem: Nutrition: Goal: Adequate nutrition will be maintained Outcome: Adequate for Discharge   Problem: Coping: Goal: Level of anxiety will decrease Outcome: Adequate for Discharge   Problem: Elimination: Goal: Will not experience complications related to bowel motility Outcome: Adequate for Discharge Goal:  Will not experience complications related to urinary retention Outcome: Adequate for Discharge   Problem: Pain Managment: Goal: General experience of comfort will improve Outcome: Adequate for Discharge   Problem: Safety: Goal: Ability to remain free from injury will improve Outcome: Adequate for Discharge   Problem: Skin Integrity: Goal: Risk for impaired skin integrity will decrease Outcome: Adequate for Discharge

## 2022-08-10 ENCOUNTER — Other Ambulatory Visit: Payer: Self-pay | Admitting: Adult Health

## 2022-08-10 ENCOUNTER — Encounter: Payer: Self-pay | Admitting: Adult Health

## 2022-08-10 ENCOUNTER — Ambulatory Visit: Payer: Medicare Other | Attending: Adult Health | Admitting: Adult Health

## 2022-08-10 VITALS — BP 140/71 | HR 96 | Ht 59.0 in | Wt 138.0 lb

## 2022-08-10 DIAGNOSIS — I10 Essential (primary) hypertension: Secondary | ICD-10-CM

## 2022-08-10 DIAGNOSIS — E7800 Pure hypercholesterolemia, unspecified: Secondary | ICD-10-CM | POA: Diagnosis not present

## 2022-08-10 DIAGNOSIS — I4819 Other persistent atrial fibrillation: Secondary | ICD-10-CM | POA: Diagnosis not present

## 2022-08-10 MED ORDER — NITROGLYCERIN 0.4 MG SL SUBL: 0.4000 mg | SUBLINGUAL_TABLET | SUBLINGUAL | 3 refills | Status: DC | PRN

## 2022-08-10 NOTE — Patient Instructions (Signed)
Medication Instructions:  No Changes *If you need a refill on your cardiac medications before your next appointment, please call your pharmacy*   Lab Work: No labs If you have labs (blood work) drawn today and your tests are completely normal, you will receive your results only by: [place] (if you have MyChart) OR A paper copy in the mail If you have any lab test that is abnormal or we need to change your treatment, we will call you to review the results.   Testing/Procedures: No Testing   Follow-Up: At [facility], you and your health needs are our priority.  As part of our continuing mission to provide you with exceptional heart care, we have created designated Provider Care Teams.  These Care Teams include your primary Cardiologist (physician) and Advanced Practice Providers (APPs -  Physician Assistants and Nurse Practitioners) who all work together to provide you with the care you need, when you need it.  We recommend signing up for the patient portal called "MyChart".  Sign up information is provided on this After Visit Summary.  MyChart is used to connect with patients for Virtual Visits (Telemedicine).  Patients are able to view lab/test results, encounter notes, upcoming appointments, etc.  Non-urgent messages can be sent to your provider as well.   To learn more about what you can do with MyChart, go to [email or web address].    Your next appointment:   Keep Scheduled Appointment  Provider:   Glenetta Hew, MD

## 2022-08-11 DIAGNOSIS — I5033 Acute on chronic diastolic (congestive) heart failure: Secondary | ICD-10-CM | POA: Diagnosis not present

## 2022-08-11 DIAGNOSIS — E785 Hyperlipidemia, unspecified: Secondary | ICD-10-CM | POA: Diagnosis not present

## 2022-08-11 DIAGNOSIS — I110 Hypertensive heart disease with heart failure: Secondary | ICD-10-CM | POA: Diagnosis not present

## 2022-08-11 DIAGNOSIS — I2510 Atherosclerotic heart disease of native coronary artery without angina pectoris: Secondary | ICD-10-CM | POA: Diagnosis not present

## 2022-08-11 DIAGNOSIS — I214 Non-ST elevation (NSTEMI) myocardial infarction: Secondary | ICD-10-CM | POA: Diagnosis not present

## 2022-08-13 DIAGNOSIS — E785 Hyperlipidemia, unspecified: Secondary | ICD-10-CM | POA: Diagnosis not present

## 2022-08-13 DIAGNOSIS — I214 Non-ST elevation (NSTEMI) myocardial infarction: Secondary | ICD-10-CM | POA: Diagnosis not present

## 2022-08-13 DIAGNOSIS — I5033 Acute on chronic diastolic (congestive) heart failure: Secondary | ICD-10-CM | POA: Diagnosis not present

## 2022-08-13 DIAGNOSIS — I2510 Atherosclerotic heart disease of native coronary artery without angina pectoris: Secondary | ICD-10-CM | POA: Diagnosis not present

## 2022-08-13 DIAGNOSIS — I110 Hypertensive heart disease with heart failure: Secondary | ICD-10-CM | POA: Diagnosis not present

## 2022-08-14 DIAGNOSIS — I2510 Atherosclerotic heart disease of native coronary artery without angina pectoris: Secondary | ICD-10-CM | POA: Diagnosis not present

## 2022-08-14 DIAGNOSIS — I5033 Acute on chronic diastolic (congestive) heart failure: Secondary | ICD-10-CM | POA: Diagnosis not present

## 2022-08-14 DIAGNOSIS — I110 Hypertensive heart disease with heart failure: Secondary | ICD-10-CM | POA: Diagnosis not present

## 2022-08-14 DIAGNOSIS — E785 Hyperlipidemia, unspecified: Secondary | ICD-10-CM | POA: Diagnosis not present

## 2022-08-14 DIAGNOSIS — I214 Non-ST elevation (NSTEMI) myocardial infarction: Secondary | ICD-10-CM | POA: Diagnosis not present

## 2022-08-15 DIAGNOSIS — I5033 Acute on chronic diastolic (congestive) heart failure: Secondary | ICD-10-CM | POA: Diagnosis not present

## 2022-08-15 DIAGNOSIS — I110 Hypertensive heart disease with heart failure: Secondary | ICD-10-CM | POA: Diagnosis not present

## 2022-08-15 DIAGNOSIS — E785 Hyperlipidemia, unspecified: Secondary | ICD-10-CM | POA: Diagnosis not present

## 2022-08-15 DIAGNOSIS — I2510 Atherosclerotic heart disease of native coronary artery without angina pectoris: Secondary | ICD-10-CM | POA: Diagnosis not present

## 2022-08-15 DIAGNOSIS — I214 Non-ST elevation (NSTEMI) myocardial infarction: Secondary | ICD-10-CM | POA: Diagnosis not present

## 2022-08-16 ENCOUNTER — Other Ambulatory Visit: Payer: Self-pay

## 2022-08-20 DIAGNOSIS — I5033 Acute on chronic diastolic (congestive) heart failure: Secondary | ICD-10-CM | POA: Diagnosis not present

## 2022-08-20 DIAGNOSIS — E785 Hyperlipidemia, unspecified: Secondary | ICD-10-CM | POA: Diagnosis not present

## 2022-08-20 DIAGNOSIS — I214 Non-ST elevation (NSTEMI) myocardial infarction: Secondary | ICD-10-CM | POA: Diagnosis not present

## 2022-08-20 DIAGNOSIS — I110 Hypertensive heart disease with heart failure: Secondary | ICD-10-CM | POA: Diagnosis not present

## 2022-08-20 DIAGNOSIS — I2510 Atherosclerotic heart disease of native coronary artery without angina pectoris: Secondary | ICD-10-CM | POA: Diagnosis not present

## 2022-08-21 DIAGNOSIS — E7800 Pure hypercholesterolemia, unspecified: Secondary | ICD-10-CM | POA: Diagnosis not present

## 2022-08-21 DIAGNOSIS — N1831 Chronic kidney disease, stage 3a: Secondary | ICD-10-CM | POA: Diagnosis not present

## 2022-08-21 DIAGNOSIS — I130 Hypertensive heart and chronic kidney disease with heart failure and stage 1 through stage 4 chronic kidney disease, or unspecified chronic kidney disease: Secondary | ICD-10-CM | POA: Diagnosis not present

## 2022-08-21 DIAGNOSIS — Z9989 Dependence on other enabling machines and devices: Secondary | ICD-10-CM | POA: Diagnosis not present

## 2022-08-21 DIAGNOSIS — I4819 Other persistent atrial fibrillation: Secondary | ICD-10-CM | POA: Diagnosis not present

## 2022-08-21 DIAGNOSIS — I129 Hypertensive chronic kidney disease with stage 1 through stage 4 chronic kidney disease, or unspecified chronic kidney disease: Secondary | ICD-10-CM | POA: Diagnosis not present

## 2022-08-21 DIAGNOSIS — Z79899 Other long term (current) drug therapy: Secondary | ICD-10-CM | POA: Diagnosis not present

## 2022-08-21 DIAGNOSIS — I5032 Chronic diastolic (congestive) heart failure: Secondary | ICD-10-CM | POA: Diagnosis not present

## 2022-08-22 ENCOUNTER — Other Ambulatory Visit (HOSPITAL_COMMUNITY): Payer: Self-pay

## 2022-08-22 ENCOUNTER — Other Ambulatory Visit: Payer: Self-pay

## 2022-08-23 DIAGNOSIS — E785 Hyperlipidemia, unspecified: Secondary | ICD-10-CM | POA: Diagnosis not present

## 2022-08-23 DIAGNOSIS — I5033 Acute on chronic diastolic (congestive) heart failure: Secondary | ICD-10-CM | POA: Diagnosis not present

## 2022-08-23 DIAGNOSIS — I110 Hypertensive heart disease with heart failure: Secondary | ICD-10-CM | POA: Diagnosis not present

## 2022-08-23 DIAGNOSIS — I214 Non-ST elevation (NSTEMI) myocardial infarction: Secondary | ICD-10-CM | POA: Diagnosis not present

## 2022-08-23 DIAGNOSIS — I2510 Atherosclerotic heart disease of native coronary artery without angina pectoris: Secondary | ICD-10-CM | POA: Diagnosis not present

## 2022-08-31 ENCOUNTER — Telehealth: Payer: Self-pay | Admitting: Cardiology

## 2022-08-31 DIAGNOSIS — E785 Hyperlipidemia, unspecified: Secondary | ICD-10-CM | POA: Diagnosis not present

## 2022-08-31 DIAGNOSIS — I2510 Atherosclerotic heart disease of native coronary artery without angina pectoris: Secondary | ICD-10-CM | POA: Diagnosis not present

## 2022-08-31 DIAGNOSIS — I110 Hypertensive heart disease with heart failure: Secondary | ICD-10-CM | POA: Diagnosis not present

## 2022-08-31 DIAGNOSIS — I5033 Acute on chronic diastolic (congestive) heart failure: Secondary | ICD-10-CM | POA: Diagnosis not present

## 2022-08-31 DIAGNOSIS — I214 Non-ST elevation (NSTEMI) myocardial infarction: Secondary | ICD-10-CM | POA: Diagnosis not present

## 2022-08-31 NOTE — Telephone Encounter (Signed)
Called patient daughter, advised to contact us if HR were consistently elevated, or symptomatic to call us and let us know.  Patient daughter verbalized understanding.   Thanks!

## 2022-08-31 NOTE — Telephone Encounter (Signed)
Caller stated patient has irregular HR with intermittent SOB and they are discharging the patient from home health services.  Caller stated patient's daughter will need a call back to let her know if she should report irregular HR episodes going forward.

## 2022-09-02 ENCOUNTER — Other Ambulatory Visit (HOSPITAL_COMMUNITY): Payer: Self-pay

## 2022-09-06 DIAGNOSIS — I5033 Acute on chronic diastolic (congestive) heart failure: Secondary | ICD-10-CM | POA: Diagnosis not present

## 2022-09-06 DIAGNOSIS — J9601 Acute respiratory failure with hypoxia: Secondary | ICD-10-CM | POA: Diagnosis not present

## 2022-09-06 DIAGNOSIS — I4891 Unspecified atrial fibrillation: Secondary | ICD-10-CM | POA: Diagnosis not present

## 2022-09-06 DIAGNOSIS — J45901 Unspecified asthma with (acute) exacerbation: Secondary | ICD-10-CM | POA: Diagnosis not present

## 2022-09-21 ENCOUNTER — Ambulatory Visit: Payer: Medicare Other | Admitting: Cardiology

## 2022-09-26 DIAGNOSIS — I129 Hypertensive chronic kidney disease with stage 1 through stage 4 chronic kidney disease, or unspecified chronic kidney disease: Secondary | ICD-10-CM | POA: Diagnosis not present

## 2022-09-26 DIAGNOSIS — I5032 Chronic diastolic (congestive) heart failure: Secondary | ICD-10-CM | POA: Diagnosis not present

## 2022-09-26 DIAGNOSIS — Z0000 Encounter for general adult medical examination without abnormal findings: Secondary | ICD-10-CM | POA: Diagnosis not present

## 2022-09-26 DIAGNOSIS — Z79899 Other long term (current) drug therapy: Secondary | ICD-10-CM | POA: Diagnosis not present

## 2022-09-26 DIAGNOSIS — I130 Hypertensive heart and chronic kidney disease with heart failure and stage 1 through stage 4 chronic kidney disease, or unspecified chronic kidney disease: Secondary | ICD-10-CM | POA: Diagnosis not present

## 2022-09-26 DIAGNOSIS — E7800 Pure hypercholesterolemia, unspecified: Secondary | ICD-10-CM | POA: Diagnosis not present

## 2022-09-26 DIAGNOSIS — N1831 Chronic kidney disease, stage 3a: Secondary | ICD-10-CM | POA: Diagnosis not present

## 2022-09-26 DIAGNOSIS — R0602 Shortness of breath: Secondary | ICD-10-CM | POA: Diagnosis not present

## 2022-09-26 DIAGNOSIS — I4819 Other persistent atrial fibrillation: Secondary | ICD-10-CM | POA: Diagnosis not present

## 2022-09-26 DIAGNOSIS — R7303 Prediabetes: Secondary | ICD-10-CM | POA: Diagnosis not present

## 2022-09-26 DIAGNOSIS — J4540 Moderate persistent asthma, uncomplicated: Secondary | ICD-10-CM | POA: Diagnosis not present

## 2022-10-01 DIAGNOSIS — E876 Hypokalemia: Secondary | ICD-10-CM | POA: Diagnosis not present

## 2022-10-01 DIAGNOSIS — N1831 Chronic kidney disease, stage 3a: Secondary | ICD-10-CM | POA: Diagnosis not present

## 2022-10-01 DIAGNOSIS — I4819 Other persistent atrial fibrillation: Secondary | ICD-10-CM | POA: Diagnosis not present

## 2022-10-01 DIAGNOSIS — I130 Hypertensive heart and chronic kidney disease with heart failure and stage 1 through stage 4 chronic kidney disease, or unspecified chronic kidney disease: Secondary | ICD-10-CM | POA: Diagnosis not present

## 2022-10-01 DIAGNOSIS — I5032 Chronic diastolic (congestive) heart failure: Secondary | ICD-10-CM | POA: Diagnosis not present

## 2022-10-03 NOTE — Progress Notes (Unsigned)
Cardiology Clinic Note   Date: 10/04/2022 ID: SHIRLIE HEICHELBECH, DOB 20-Dec-1927, MRN [id number]  Primary Cardiologist:  Glenetta Hew, MD  Patient Profile    [name] is a 89 y.o. female who presents to the clinic today for concerns of elevated heart rate and increased shortness of breath.  Past medical history significant for: CAD. LHC 01/01/2001 (unstable angina): Proximal OM1 90%.  PTCA with cutting balloon to OM2. LHC 03/02/2001 (NSTEMI): Ostial OM1 30%.  Proximal OM2 95%.  PCI with Cutting Balloon angioplasty and stent placement proximal OM2. LHC 05/22/2002 (unstable angina): In-stent restenosis proximal OM2.  PTCA with Cutting Balloon OM2. LHC 10/20/2002 (abnormal stress test): Patent stent. LHC 07/15/2012 (unstable angina): Ostial OM1 20%.  OM2 10% in-stent restenosis.  Proximal RCA 80 to 90%.  PCI with DES to proximal RCA. Nuclear stress test 09/28/2014: Normal study. PAF. Onset January 2024. Chronic diastolic heart failure. Echo 07/29/2022: EF 60 to 65%.  Grade II DD.  Moderate MR.  Moderate MAC.  Aortic valve sclerosis without stenosis.  Heart rhythm during study was sinus with very frequent PACs and frequent brief episodes of A. tach. Hypertension. Hyperlipidemia. Lipid panel 09/26/2022: LDL 60, HDL 86, TG 140, total 170. Colon cancer.   History of Present Illness    [name] is a longtime patient of cardiology.  She is followed by Dr. Ellyn Hack for the above outlined history.  Patient went to hospital admissions in 2024.  First admission 07/27/2022 to 07/29/2022 for new onset A-fib with RVR.  Patient presented to [facility] walk-in clinic for evaluation of shortness of breath and found to be in A-fib.  She was transported to the ED and underwent admission.  Heart rate controlled with IV Cardizem.  She was discharged on p.o. Cardizem and Eliquis.  Patient was readmitted to the hospital on 08/06/2022 to 08/08/2022 for acute on chronic diastolic heart failure. She as back in afib  with RVR at that time.   Patient was last seen in the office on 08/10/2022 by Jory Sims, NP.  She was doing well at that time and no changes were made.  Today, patient is accompanied by her daughter.  Patient and daughter report she has been doing fairly well since discharge from hospital.  She is now on 2 L of supplemental O2 continuously.  Patient reports increased shortness of breath for the last 2 weeks that she attributes to the pollen.  She denies lower extremity edema, orthopnea, or PND.  Brisk diuresis on Lasix.  She has no cardiac awareness of A-fib.  She was evaluated by PCP on 10/01/2022 for increased shortness of breath.  EKG showed A-fib with rate of 116 bpm (I am unable to view this EKG).  PCP is concerned about labile heart rate and suggested patient come for evaluation at cardiology.  Patient is in sinus rhythm with PACs today with a rate of 69 bpm.  Patient lives alone with family frequently checking in on her.  She states she is able to do her normal household activities "I just have to move a little slower."  Patient denies chest pain, tightness or pressure.    ROS: All other systems reviewed and are otherwise negative except as noted in History of Present Illness.  Studies Reviewed    ECG personally reviewed by me today: NSR with PACs, poor R wave progression, 69 bpm.  No significant changes from 03/12/2022.  Risk Assessment/Calculations     CHA2DS2-VASc Score = 5   This indicates a  7.2% annual risk of stroke. The patient's score is based upon: CHF History: 0 HTN History: 1 Diabetes History: 0 Stroke History: 0 Vascular Disease History: 1 Age Score: 2 Gender Score: 1             Physical Exam    VS:  BP (!) 100/50 (BP Location: Left Arm, Patient Position: Sitting, Cuff Size: Normal)   Pulse 71   Ht 4\' 11"  (1.499 m)   Wt 146 lb (66.2 kg)   BMI 29.49 kg/m  , BMI Body mass index is 29.49 kg/m.  GEN: Well nourished, well developed, in no acute  distress. Neck: No JVD or carotid bruits. Cardiac:  RRR. No murmurs. No rubs or gallops.   Respiratory:  Respirations regular and unlabored. Clear to auscultation without rales, wheezing or rhonchi. GI: Soft, nontender, nondistended. Extremities: Radials/DP/PT 2+ and equal bilaterally. No clubbing or cyanosis. No edema.  Skin: Warm and dry, no rash. Neuro: Strength intact.  Assessment & Plan   CAD.  History of multiple PCI.  LHC January 2014 was last PCI with DES to proximal RCA.  Nuclear stress test March 2016 was normal.  Patient denies chest pain, tightness or pressure.  Continue Eliquis and as needed SL NTG. PAF.  Onset January 2024.  Patient has no cardiac awareness of A-fib.  She was evaluated by PCP on 10/01/2022 with EKG showing A-fib with rate 116 bpm (EKG not available).  Patient denies bleeding concerns.  EKG today shows normal sinus rhythm with PACs, 69 bpm.  Will refer to A-fib clinic. Continue Eliquis and Cardizem. Chronic diastolic heart failure.  Echo January 2024 showed EF 60 to 65%, grade 2 DD.  Patient reports a 2-week history of increased shortness of breath that she attributes to pollen.  Patient denies lower extremity edema, orthopnea, or PND.  Patient is able to complete household activities (she lives alone) by moving a little slower.  She is on 2 L of supplemental O2 continuously.  Brisk diuresis on Lasix.  Euvolemic and well compensated on exam.  I do not feel like her increased shortness of breath is related to her heart failure.  Continue Lasix. Hypertension. BP today 100/50.  Patient denies dizziness or headaches. Continue diltiazem.   Disposition: Refer to A-fib clinic.  Return as indicated by previous recall or sooner as needed.        Signed, Justice Britain. Tresea Heine, DNP, NP-C

## 2022-10-04 ENCOUNTER — Encounter: Payer: Self-pay | Admitting: Student

## 2022-10-04 ENCOUNTER — Ambulatory Visit: Payer: Medicare Other | Attending: Student | Admitting: Student

## 2022-10-04 VITALS — BP 100/50 | HR 71 | Ht 59.0 in | Wt 146.0 lb

## 2022-10-04 DIAGNOSIS — I5032 Chronic diastolic (congestive) heart failure: Secondary | ICD-10-CM

## 2022-10-04 DIAGNOSIS — Z9861 Coronary angioplasty status: Secondary | ICD-10-CM

## 2022-10-04 DIAGNOSIS — I10 Essential (primary) hypertension: Secondary | ICD-10-CM | POA: Diagnosis not present

## 2022-10-04 DIAGNOSIS — I480 Paroxysmal atrial fibrillation: Secondary | ICD-10-CM | POA: Diagnosis not present

## 2022-10-04 DIAGNOSIS — I2510 Atherosclerotic heart disease of native coronary artery without angina pectoris: Secondary | ICD-10-CM

## 2022-10-04 NOTE — Patient Instructions (Signed)
Medication Instructions:  Your physician recommends that you continue on your current medications as directed. Please refer to the Current Medication list given to you today.   *If you need a refill on your cardiac medications before your next appointment, please call your pharmacy*   Lab Work: None ordered    Testing/Procedures: None ordered    Follow-Up: At [facility], you and your health needs are our priority.  As part of our continuing mission to provide you with exceptional heart care, we have created designated Provider Care Teams.  These Care Teams include your primary Cardiologist (physician) and Advanced Practice Providers (APPs -  Physician Assistants and Nurse Practitioners) who all work together to provide you with the care you need, when you need it.  We recommend signing up for the patient portal called "MyChart".  Sign up information is provided on this After Visit Summary.  MyChart is used to connect with patients for Virtual Visits (Telemedicine).  Patients are able to view lab/test results, encounter notes, upcoming appointments, etc.  Non-urgent messages can be sent to your provider as well.   To learn more about what you can do with MyChart, go to [email or web address].    Your next appointment:   Keep scheduled appointment  Provider:   Glenetta Hew, MD

## 2022-10-07 DIAGNOSIS — J45901 Unspecified asthma with (acute) exacerbation: Secondary | ICD-10-CM | POA: Diagnosis not present

## 2022-10-07 DIAGNOSIS — I4891 Unspecified atrial fibrillation: Secondary | ICD-10-CM | POA: Diagnosis not present

## 2022-10-07 DIAGNOSIS — I5033 Acute on chronic diastolic (congestive) heart failure: Secondary | ICD-10-CM | POA: Diagnosis not present

## 2022-10-07 DIAGNOSIS — J9601 Acute respiratory failure with hypoxia: Secondary | ICD-10-CM | POA: Diagnosis not present

## 2022-10-11 DIAGNOSIS — E876 Hypokalemia: Secondary | ICD-10-CM | POA: Diagnosis not present

## 2022-10-22 ENCOUNTER — Ambulatory Visit (HOSPITAL_COMMUNITY)
Admission: RE | Admit: 2022-10-22 | Discharge: 2022-10-22 | Disposition: A | Discharge status: Home / Self Care | Payer: Medicare Other | Source: Ambulatory Visit | Attending: Internal Medicine | Admitting: Internal Medicine

## 2022-10-22 ENCOUNTER — Other Ambulatory Visit (HOSPITAL_COMMUNITY): Payer: Self-pay | Admitting: Internal Medicine

## 2022-10-22 VITALS — BP 122/52 | HR 77 | Ht 59.0 in | Wt 142.8 lb

## 2022-10-22 DIAGNOSIS — D6869 Other thrombophilia: Secondary | ICD-10-CM

## 2022-10-22 DIAGNOSIS — I2510 Atherosclerotic heart disease of native coronary artery without angina pectoris: Secondary | ICD-10-CM | POA: Diagnosis not present

## 2022-10-22 DIAGNOSIS — I10 Essential (primary) hypertension: Secondary | ICD-10-CM | POA: Insufficient documentation

## 2022-10-22 DIAGNOSIS — Z6828 Body mass index (BMI) 28.0-28.9, adult: Secondary | ICD-10-CM | POA: Diagnosis not present

## 2022-10-22 DIAGNOSIS — E785 Hyperlipidemia, unspecified: Secondary | ICD-10-CM | POA: Diagnosis not present

## 2022-10-22 DIAGNOSIS — I4891 Unspecified atrial fibrillation: Secondary | ICD-10-CM | POA: Diagnosis not present

## 2022-10-22 DIAGNOSIS — I480 Paroxysmal atrial fibrillation: Secondary | ICD-10-CM | POA: Insufficient documentation

## 2022-10-22 DIAGNOSIS — E669 Obesity, unspecified: Secondary | ICD-10-CM | POA: Diagnosis not present

## 2022-10-22 DIAGNOSIS — D649 Anemia, unspecified: Secondary | ICD-10-CM | POA: Insufficient documentation

## 2022-10-22 DIAGNOSIS — Z85038 Personal history of other malignant neoplasm of large intestine: Secondary | ICD-10-CM | POA: Diagnosis not present

## 2022-10-22 DIAGNOSIS — I340 Nonrheumatic mitral (valve) insufficiency: Secondary | ICD-10-CM | POA: Diagnosis not present

## 2022-10-22 MED ORDER — AMIODARONE HCL 200 MG PO TABS: ORAL_TABLET | ORAL | 0 refills | Status: DC

## 2022-10-22 NOTE — Progress Notes (Signed)
Primary Care Physician: Thana Ates, MD Primary Cardiologist: Dr. Herbie Baltimore Primary Electrophysiologist: None Referring Physician: Carlos Levering, NP   [name] is a 89 y.o. female with a history of CAD s/p DES, HTN, HLD, colon cancer, anemia, and atrial fibrillation who presents for consultation in the [facility]. She was hospitalized due to Afib with RVR 1/26-28/24 and 2/5-7/24. Evaluated by PCP on 10/01/22 and found to be in Afib with RVR (unable to view ECG). Most recently seen by Cardiology on 4/4 and found to be in SR. Patient is on Eliquis 5 mg BID for a CHADS2VASC score of 5.  On evaluation today, she is in SR currently. Daughter here with patient and notes that overall she generally does not do a lot of physical activity and this was the case prior to January diagnosis of Afib. She does not have cardiac awareness when she is in Afib. Currently on 2L via [place] but cannula is hanging on her ear. Daughter would like to know possible options for management of Afib considering mother still prefers to live by herself.   She is compliant with anticoagulation and has not missed any doses. She has no bleeding concerns.  Today, she denies symptoms of palpitations, chest pain, shortness of breath, orthopnea, PND, lower extremity edema, dizziness, presyncope, syncope, snoring, daytime somnolence, bleeding, or neurologic sequela. The patient is tolerating medications without difficulties and is otherwise without complaint today.   Atrial Fibrillation Risk Factors:  She uses oxygen via [employer].  she does not have a history of rheumatic fever. she does not have a history of alcohol use. The patient does not have a history of early familial atrial fibrillation or other arrhythmias.  she has a BMI of Body mass index is 28.84 kg/m.[name] Kitchen Filed Weights   10/22/22 1127  Weight: 64.8 kg    Family History  Problem Relation Age of Onset   Pancreatic cancer Father     Prostate cancer Father    Skin cancer Father    Heart disease Mother    Hypertension Mother    Pancreatic cancer Brother    Heart disease Sister    Colon cancer Brother        ? Colostomy   Prostate cancer Brother    Colon cancer Sister    Melanoma Brother      Atrial Fibrillation Management history:  Previous antiarrhythmic drugs: None Previous cardioversions: None Previous ablations: None Anticoagulation history: Eliquis 5 mg BID   Past Medical History:  Diagnosis Date   Adenomatous colon polyp MAY 2013   HOSPITALIZED AT [facility] WITH HGB 7 -TRANSFUSED-MASS FOUND IN ASCENDING COLON   Aortic sclerosis    With murmur   Asthma    CAD S/P percutaneous coronary angioplasty 2002, 2003,2014   a) Unstable Angina 7/'02: 1st Cutting PTCA- OM2 --> restenosed 9/'02 NSTEMI--> 3.0 mm x 12 mm BMS-OM 2; b) 11/'03 DOE w/ + Cardiolite - Cutter PTCA for ISR -- patent in 2004 (after False + Cardiolite); c) 07/2012 - Unstable Angina -- PCI to proximal RCA with Promus Premier DES 2.75 mm x 20 mm (3.0 mm), patent OM2 stent ~10% ISR (HARDING)   Complication of anesthesia 2009   HIP REPLACEMENT-PT HAD HARD TIME WAKING UP--FELT LIKE SHE COULDN'T BREATHE   Coronary stent restenosis due to scar tissue 05/2002   Cutting Balloon PTCA to OM2 BMS   Diverticulosis 10/2012   Seen on Colonoscopy   DJD (degenerative joint disease), thoracolumbar    Back,  Hips & Knees   Essential hypertension    H/O Colon cancer 10/2011   GI - Drs. Leone Payor - colonoscopy 10/2012: diverticulosis, stable ileocolic anastomosis of R colon   Hyperlipidemia with target LDL less than 70    Internal hemorrhoids    Iron deficiency anemia due to chronic blood loss 10/2011   Blood Transfusion (EGD 09/2012 - unremarkable)   Medication intolerance - Plavix, Effient, Brilinta    Easy bruising, GI bleeds (led to Dx of Colon CA), Brilinta - dyspnea   Multiple lung nodules on CT 08/2012   scattered, bilateral but right>left.    Non-Q wave ST  elevation myocardial infarction (STEMI) involving left circumflex coronary artery 03/2001   a) Severe thrombic ISR of prior PTCA site in OM2 --> BMS PCI; b) Echo 08/2012: Normal LV size & function.  EF 55-60%, no regional WMA, Gr 1 DD, mild MR, mild-mod TR - NO Pulmonary HTN   Osteoarthritis (arthritis due to wear and tear of joints)    Bilateral Hip Arthroplasty, L Knee TKA   Osteoporosis    Past Surgical History:  Procedure Laterality Date   APPENDECTOMY     CHOLECYSTECTOMY     COLONOSCOPY  11/16/2011   Procedure: COLONOSCOPY;  Surgeon: Iva Boop, MD;  Location: [facility] ENDOSCOPY;  Service: Endoscopy;  Laterality: N/A;   COLONOSCOPY  10/2012   Leone Payor: diverticulosis, stable R colon ileocolic anastomosis   COLONOSCOPY W/ BIOPSIES AND POLYPECTOMY  06/15/2008   adenomatous polyps, diverticulosis, internal hemorrhoids   COLONOSCOPY WITH PROPOFOL N/A 07/18/2015   Procedure: COLONOSCOPY WITH PROPOFOL;  Surgeon: Iva Boop, MD;  Location: [facility] ENDOSCOPY;  Service: Endoscopy;  Laterality: N/A;   CORONARY ANGIOPLASTY WITH STENT PLACEMENT  2002; 2003; 07/2012   a)  7/'02: 1st Cutting PTCA- OM2 --> restenosed 9/'02 --> 3.0 mm x 12 mm BMS-OM 2; b) 11/'03 - Cutter PTCA for ISR -- patent in 2004;    DILATATION & CURETTAGE/HYSTEROSCOPY WITH MYOSURE N/A 03/21/2022   Procedure: DILATATION & CURETTAGE/HYSTEROSCOPY WITH MYOSURE, PAP SMEAR;  Surgeon: Steva Ready, DO;  Location: [facility] OR;  Service: Gynecology;  Laterality: N/A;   ESOPHAGOGASTRODUODENOSCOPY N/A 10/23/2012   Procedure: ESOPHAGOGASTRODUODENOSCOPY (EGD);  Surgeon: Louis Meckel, MD;  Location: [facility] ENDOSCOPY;  Service: Endoscopy;  Laterality: N/A;   INCISIONAL HERNIA REPAIR N/A 01/29/2013   Procedure: LAPAROSCOPIC INCISIONAL HERNIA;  Surgeon: Shelly Rubenstein, MD;  Location: [facility] ORS;  Service: General;  Laterality: N/A;   INSERTION OF MESH N/A 01/29/2013   Procedure: INSERTION OF MESH;  Surgeon: Shelly Rubenstein, MD;  Location: [facility] ORS;  Service:  General;  Laterality: N/A;   LAPAROSCOPY  03/21/2022   Procedure: DIAGNOSTIC LAPAROSCOPY;  Surgeon: Steva Ready, DO;  Location: [facility] OR;  Service: Gynecology;;   LEFT HEART CATHETERIZATION WITH CORONARY ANGIOGRAM N/A 07/15/2012   Procedure: LEFT HEART CATHETERIZATION WITH CORONARY ANGIOGRAM;  Surgeon: Marykay Lex, MD;  Location: [facility] CATH LAB: pRCA 80-90%, OM2 BMS ~10% ISR   NM MYOVIEW LTD  08/2014    Normal LV size and function. Normal wall motion. LOW RISK.   PERCUTANEOUS CORONARY STENT INTERVENTION (PCI-S)  07/15/2012   Procedure: PERCUTANEOUS CORONARY STENT INTERVENTION (PCI-S);  Surgeon: Marykay Lex, MD;  Location: [facility] CATH LAB: c) PCI to proximal RCA with Promus Premier DES 2.75 mm x 20 mm (3.0 mm)   REPLACEMENT TOTAL KNEE Left 2003   TOTAL HIP ARTHROPLASTY  09/08/2007   left, Dr. Lequita Halt   TOTAL HIP ARTHROPLASTY Right 2003   TRANSTHORACIC ECHOCARDIOGRAM  08/2012   Normal LV size & function.  EF 55-60%, no regional WMA, Gr 1 DD, mild MR, mild-mod TR; aortic sclerosis without stenosis    Current Outpatient Medications  Medication Sig Dispense Refill   acetaminophen (TYLENOL) 500 MG tablet Take 500 mg by mouth in the morning and at bedtime.     albuterol (PROVENTIL HFA;VENTOLIN HFA) 108 (90 BASE) MCG/ACT inhaler Inhale 2 puffs into the lungs every 6 (six) hours as needed for wheezing.     apixaban (ELIQUIS) 5 MG TABS tablet Take 1 tablet (5 mg total) by mouth 2 (two) times daily. 60 tablet 1   cetirizine (ZYRTEC) 10 MG tablet Take 10 mg by mouth at bedtime.     diltiazem (CARDIZEM CD) 300 MG 24 hr capsule Take 1 capsule (300 mg total) by mouth daily. 30 capsule 1   escitalopram (LEXAPRO) 5 MG tablet Take 5 mg by mouth daily.     fluticasone (FLONASE) 50 MCG/ACT nasal spray Place 1 spray into the nose daily as needed for allergies.     furosemide (LASIX) 40 MG tablet Take 1 tablet (40 mg total) by mouth daily. 30 tablet 1   LORazepam (ATIVAN) 0.5 MG tablet Take 0.5 mg by mouth 2  (two) times daily as needed for anxiety.     Melatonin 5 MG CHEW Chew 5 mg by mouth at bedtime as needed (sleep).     metoprolol tartrate (LOPRESSOR) 25 MG tablet Take 25 mg by mouth 2 (two) times daily.     Multiple Vitamin (MULTIVITAMIN) tablet Take 1 tablet by mouth daily.     nitroGLYCERIN (NITROSTAT) 0.4 MG SL tablet Place 1 tablet (0.4 mg total) under the tongue every 5 (five) minutes as needed for chest pain. 180 tablet 0   potassium chloride (KLOR-CON M) 10 MEQ tablet Take 2 tablets (20 mEq total) by mouth daily. (Patient taking differently: Take 20 mEq by mouth 2 (two) times daily.) 30 tablet 0   No current facility-administered medications for this encounter.    Allergies  Allergen Reactions   Brilinta [Ticagrelor] Shortness Of Breath    Bleeding    Lipitor [Atorvastatin] Shortness Of Breath and Other (See Comments)    Myalgias    Plavix [Clopidogrel] Shortness Of Breath and Other (See Comments)    Bleeding    Codeine Nausea And Vomiting   Mobic [Meloxicam] Other (See Comments)    Stomach upset   Paxil [Paroxetine] Other (See Comments)    Stomach upset Myalgias  Chills    Sulfa Antibiotics Diarrhea and Other (See Comments)    Stomach upset   Sulfonamide Derivatives Diarrhea and Other (See Comments)    Stomach upset    Social History   Socioeconomic History   Marital status: Married    Spouse name: Not on file   Number of children: 2   Years of education: Not on file   Highest education level: Not on file  Occupational History   Occupation: retired  Tobacco Use   Smoking status: Former    Years: 30    Types: Cigarettes    Quit date: 07/02/1982    Years since quitting: 40.3   Smokeless tobacco: Never  Vaping Use   Vaping Use: Never used  Substance and Sexual Activity   Alcohol use: Yes    Alcohol/week: 0.0 standard drinks of alcohol    Comment: occ wine   Drug use: No   Sexual activity: Not on file  Other Topics Concern   Not on file  Social History  Narrative   Married, Mother of 2 adopted children. She has 3 grandchildren.   She worked as a Agricultural consultant at [employer].   She quit smoking in 1984, and does not drink alcohol.   Social Determinants of Health   Financial Resource Strain: Not on file  Food Insecurity: No Food Insecurity (08/06/2022)   Hunger Vital Sign    Worried About Running Out of Food in the Last Year: Never true    Ran Out of Food in the Last Year: Never true  Transportation Needs: No Transportation Needs (08/06/2022)   PRAPARE - Administrator, Civil Service (Medical): No    Lack of Transportation (Non-Medical): No  Physical Activity: Not on file  Stress: Not on file  Social Connections: Not on file  Intimate Partner Violence: Not At Risk (08/06/2022)   Humiliation, Afraid, Rape, and Kick questionnaire    Fear of Current or Ex-Partner: No    Emotionally Abused: No    Physically Abused: No    Sexually Abused: No     ROS- All systems are reviewed and negative except as per the HPI above.  Physical Exam: Vitals:   10/22/22 1127  BP: (!) 122/52  Pulse: 77  Weight: 64.8 kg  Height: 4\' 11"  (1.499 m)    GEN- The patient is well appearing, alert and oriented x 3 today. Nasal cannula hanging on ear. Head- normocephalic, atraumatic Eyes-  Sclera clear, conjunctiva pink Ears- hearing intact Oropharynx- clear Neck- supple, no JVP Lymph- no cervical lymphadenopathy Lungs- Clear to ausculation bilaterally, normal work of breathing Heart- Regular rate and rhythm, no murmurs, rubs or gallops, PMI not laterally displaced GI- soft, NT, ND, + BS Extremities- no clubbing, cyanosis, or edema MS- no significant deformity or atrophy Skin- no rash or lesion Psych- euthymic mood, full affect Neuro- strength and sensation are intact   Wt Readings from Last 3 Encounters:  10/22/22 64.8 kg  10/04/22 66.2 kg  08/10/22 62.6 kg    EKG today demonstrates Vent. rate 77 BPM PR interval 148 ms QRS  duration 68 ms QT/QTcB 368/416 ms P-R-T axes 82 26 39 Normal sinus rhythm Cannot rule out Anterior infarct , age undetermined Abnormal ECG When compared with ECG of 06-Aug-2022 10:00, PREVIOUS ECG IS PRESENT  Echo 07/29/22 demonstrated: 1. Left ventricular ejection fraction, by estimation, is 60 to 65%. The  left ventricle has normal function. The left ventricle has no regional  wall motion abnormalities. Left ventricular diastolic parameters are  consistent with Grade II diastolic  dysfunction (pseudonormalization). Elevated left atrial pressure.   2. Right ventricular systolic function is normal. The right ventricular  size is normal.   3. The mitral valve is normal in structure. Moderate mitral valve  regurgitation. No evidence of mitral stenosis. Moderate mitral annular  calcification.   4. The aortic valve is tricuspid. There is mild thickening of the aortic  valve. Aortic valve regurgitation is not visualized. Aortic valve  sclerosis is present, with no evidence of aortic valve stenosis.   5. The inferior vena cava is normal in size with greater than 50%  respiratory variability, suggesting right atrial pressure of 3 mmHg.   Comparison(s): During the study the rhythm is not atrial fibrillation, but  sinus rhythm with very frequent PACs and frequent brief episodes of atrial  tachycardia.   Epic records are reviewed at length today.  CHA2DS2-VASc Score = 5  The patient's score is based upon: CHF History: 0 HTN History: 1  Diabetes History: 0 Stroke History: 0 Vascular Disease History: 1 Age Score: 2 Gender Score: 1       ASSESSMENT AND PLAN: Paroxysmal Atrial Fibrillation (ICD10:  I48.0) The patient's CHA2DS2-VASc score is 5, indicating a 7.2% annual risk of stroke.    Education provided about Afib with visual diagram. Discussion about medication treatments including Tikosyn (estimated 37 CrCl would put her at lowest dose) and amiodarone. After discussion, patient  does not want to be admitted for Tikosyn loading. We discussed the risks vs benefits of amiodarone treatment and the necessity for ongoing monitoring. After discussion, she would like to begin amiodarone.  We will begin amiodarone 200 mg BID x 2 weeks, then amiodarone 200 mg daily.  Recheck ECG 2 weeks. CMP 08/07/22 overall stable. TSH 07/27/22 normal.   Secondary Hypercoagulable State (ICD10:  D68.69) The patient is at significant risk for stroke/thromboembolism based upon her CHA2DS2-VASc Score of 5.  Continue Apixaban (Eliquis).  No missed doses.   3. Obesity Body mass index is 28.84 kg/m. Lifestyle modification was discussed at length including regular exercise and weight reduction. Patient admits to not being very active other than day to day household tasks.     Recheck ECG 2 weeks.   Lake Bells, PA-C [facility] [address] [place], [place] [address] [phone] 10/22/2022 11:51 AM

## 2022-10-22 NOTE — Patient Instructions (Signed)
Start Amiodarone  TWICE A DAY WITH FOOD for 14 days then will reduce to ONCE a day

## 2022-10-26 ENCOUNTER — Other Ambulatory Visit (HOSPITAL_COMMUNITY): Payer: Self-pay

## 2022-11-03 ENCOUNTER — Other Ambulatory Visit: Payer: Self-pay

## 2022-11-03 ENCOUNTER — Emergency Department (HOSPITAL_COMMUNITY): Payer: Medicare Other

## 2022-11-03 ENCOUNTER — Encounter (HOSPITAL_COMMUNITY): Payer: Self-pay

## 2022-11-03 ENCOUNTER — Inpatient Hospital Stay (HOSPITAL_COMMUNITY)
Admission: EM | Admit: 2022-11-03 | Discharge: 2022-11-06 | DRG: 291 | Disposition: A | Discharge status: Home / Self Care | Payer: Medicare Other | Attending: Internal Medicine | Admitting: Internal Medicine

## 2022-11-03 DIAGNOSIS — I5032 Chronic diastolic (congestive) heart failure: Secondary | ICD-10-CM | POA: Insufficient documentation

## 2022-11-03 DIAGNOSIS — R531 Weakness: Secondary | ICD-10-CM | POA: Diagnosis not present

## 2022-11-03 DIAGNOSIS — M810 Age-related osteoporosis without current pathological fracture: Secondary | ICD-10-CM | POA: Diagnosis not present

## 2022-11-03 DIAGNOSIS — Z9861 Coronary angioplasty status: Secondary | ICD-10-CM | POA: Diagnosis not present

## 2022-11-03 DIAGNOSIS — Z7901 Long term (current) use of anticoagulants: Secondary | ICD-10-CM

## 2022-11-03 DIAGNOSIS — Z87891 Personal history of nicotine dependence: Secondary | ICD-10-CM

## 2022-11-03 DIAGNOSIS — I252 Old myocardial infarction: Secondary | ICD-10-CM

## 2022-11-03 DIAGNOSIS — R001 Bradycardia, unspecified: Secondary | ICD-10-CM | POA: Diagnosis not present

## 2022-11-03 DIAGNOSIS — I2510 Atherosclerotic heart disease of native coronary artery without angina pectoris: Secondary | ICD-10-CM | POA: Diagnosis present

## 2022-11-03 DIAGNOSIS — E785 Hyperlipidemia, unspecified: Secondary | ICD-10-CM | POA: Diagnosis present

## 2022-11-03 DIAGNOSIS — Z96643 Presence of artificial hip joint, bilateral: Secondary | ICD-10-CM | POA: Diagnosis not present

## 2022-11-03 DIAGNOSIS — Z9981 Dependence on supplemental oxygen: Secondary | ICD-10-CM

## 2022-11-03 DIAGNOSIS — I4891 Unspecified atrial fibrillation: Secondary | ICD-10-CM | POA: Diagnosis not present

## 2022-11-03 DIAGNOSIS — N179 Acute kidney failure, unspecified: Secondary | ICD-10-CM | POA: Diagnosis not present

## 2022-11-03 DIAGNOSIS — Z8249 Family history of ischemic heart disease and other diseases of the circulatory system: Secondary | ICD-10-CM | POA: Diagnosis not present

## 2022-11-03 DIAGNOSIS — I5033 Acute on chronic diastolic (congestive) heart failure: Secondary | ICD-10-CM | POA: Diagnosis not present

## 2022-11-03 DIAGNOSIS — J45909 Unspecified asthma, uncomplicated: Secondary | ICD-10-CM | POA: Diagnosis not present

## 2022-11-03 DIAGNOSIS — Z79899 Other long term (current) drug therapy: Secondary | ICD-10-CM | POA: Diagnosis not present

## 2022-11-03 DIAGNOSIS — Z886 Allergy status to analgesic agent status: Secondary | ICD-10-CM

## 2022-11-03 DIAGNOSIS — I480 Paroxysmal atrial fibrillation: Secondary | ICD-10-CM | POA: Diagnosis present

## 2022-11-03 DIAGNOSIS — J9611 Chronic respiratory failure with hypoxia: Secondary | ICD-10-CM | POA: Diagnosis present

## 2022-11-03 DIAGNOSIS — Z1152 Encounter for screening for COVID-19: Secondary | ICD-10-CM

## 2022-11-03 DIAGNOSIS — Z96652 Presence of left artificial knee joint: Secondary | ICD-10-CM | POA: Diagnosis not present

## 2022-11-03 DIAGNOSIS — Z955 Presence of coronary angioplasty implant and graft: Secondary | ICD-10-CM | POA: Diagnosis not present

## 2022-11-03 DIAGNOSIS — J9601 Acute respiratory failure with hypoxia: Secondary | ICD-10-CM | POA: Diagnosis not present

## 2022-11-03 DIAGNOSIS — H9190 Unspecified hearing loss, unspecified ear: Secondary | ICD-10-CM | POA: Diagnosis present

## 2022-11-03 DIAGNOSIS — I10 Essential (primary) hypertension: Secondary | ICD-10-CM | POA: Diagnosis present

## 2022-11-03 DIAGNOSIS — J45901 Unspecified asthma with (acute) exacerbation: Secondary | ICD-10-CM | POA: Diagnosis not present

## 2022-11-03 DIAGNOSIS — N183 Chronic kidney disease, stage 3 unspecified: Secondary | ICD-10-CM

## 2022-11-03 DIAGNOSIS — E876 Hypokalemia: Secondary | ICD-10-CM | POA: Diagnosis not present

## 2022-11-03 DIAGNOSIS — Z800 Family history of malignant neoplasm of digestive organs: Secondary | ICD-10-CM

## 2022-11-03 DIAGNOSIS — I110 Hypertensive heart disease with heart failure: Secondary | ICD-10-CM | POA: Diagnosis not present

## 2022-11-03 DIAGNOSIS — R0600 Dyspnea, unspecified: Principal | ICD-10-CM

## 2022-11-03 DIAGNOSIS — J811 Chronic pulmonary edema: Secondary | ICD-10-CM | POA: Diagnosis not present

## 2022-11-03 DIAGNOSIS — Z86010 Personal history of colonic polyps: Secondary | ICD-10-CM | POA: Diagnosis not present

## 2022-11-03 DIAGNOSIS — Z888 Allergy status to other drugs, medicaments and biological substances status: Secondary | ICD-10-CM | POA: Diagnosis not present

## 2022-11-03 DIAGNOSIS — Z85038 Personal history of other malignant neoplasm of large intestine: Secondary | ICD-10-CM

## 2022-11-03 DIAGNOSIS — Z885 Allergy status to narcotic agent status: Secondary | ICD-10-CM

## 2022-11-03 DIAGNOSIS — R0602 Shortness of breath: Secondary | ICD-10-CM | POA: Diagnosis not present

## 2022-11-03 DIAGNOSIS — Z882 Allergy status to sulfonamides status: Secondary | ICD-10-CM

## 2022-11-03 LAB — BRAIN NATRIURETIC PEPTIDE: B Natriuretic Peptide: 567.8 pg/mL — ABNORMAL HIGH (ref 0.0–100.0)

## 2022-11-03 LAB — CBC WITH DIFFERENTIAL/PLATELET
Abs Immature Granulocytes: 0.03 10*3/uL (ref 0.00–0.07)
Basophils Absolute: 0 10*3/uL (ref 0.0–0.1)
Basophils Relative: 0 %
Eosinophils Absolute: 0.1 10*3/uL (ref 0.0–0.5)
Eosinophils Relative: 1 %
HCT: 29.4 % — ABNORMAL LOW (ref 36.0–46.0)
Hemoglobin: 9.1 g/dL — ABNORMAL LOW (ref 12.0–15.0)
Immature Granulocytes: 0 %
Lymphocytes Relative: 12 %
Lymphs Abs: 1.1 10*3/uL (ref 0.7–4.0)
MCH: 29.4 pg (ref 26.0–34.0)
MCHC: 31 g/dL (ref 30.0–36.0)
MCV: 94.8 fL (ref 80.0–100.0)
Monocytes Absolute: 0.5 10*3/uL (ref 0.1–1.0)
Monocytes Relative: 6 %
Neutro Abs: 7 10*3/uL (ref 1.7–7.7)
Neutrophils Relative %: 81 %
Platelets: 236 10*3/uL (ref 150–400)
RBC: 3.1 MIL/uL — ABNORMAL LOW (ref 3.87–5.11)
RDW: 13.9 % (ref 11.5–15.5)
WBC: 8.7 10*3/uL (ref 4.0–10.5)
nRBC: 0 % (ref 0.0–0.2)

## 2022-11-03 LAB — COMPREHENSIVE METABOLIC PANEL
ALT: 11 U/L (ref 0–44)
AST: 25 U/L (ref 15–41)
Albumin: 3.8 g/dL (ref 3.5–5.0)
Alkaline Phosphatase: 44 U/L (ref 38–126)
Anion gap: 15 (ref 5–15)
BUN: 27 mg/dL — ABNORMAL HIGH (ref 8–23)
CO2: 22 mmol/L (ref 22–32)
Calcium: 9.5 mg/dL (ref 8.9–10.3)
Chloride: 102 mmol/L (ref 98–111)
Creatinine, Ser: 1.52 mg/dL — ABNORMAL HIGH (ref 0.44–1.00)
GFR, Estimated: 32 mL/min — ABNORMAL LOW (ref >60)
Glucose, Bld: 145 mg/dL — ABNORMAL HIGH (ref 70–99)
Potassium: 4.4 mmol/L (ref 3.5–5.1)
Sodium: 139 mmol/L (ref 135–145)
Total Bilirubin: 0.9 mg/dL (ref 0.3–1.2)
Total Protein: 6.7 g/dL (ref 6.5–8.1)

## 2022-11-03 LAB — TROPONIN I (HIGH SENSITIVITY)
Troponin I (High Sensitivity): 29 ng/L — ABNORMAL HIGH (ref <18)
Troponin I (High Sensitivity): 27 ng/L — ABNORMAL HIGH (ref <18)

## 2022-11-03 MED ORDER — AMIODARONE HCL 200 MG PO TABS: 200.0000 mg | ORAL_TABLET | Freq: Every day | ORAL | Status: DC

## 2022-11-03 MED ORDER — FUROSEMIDE 10 MG/ML IJ SOLN
40.0000 mg | Freq: Once | INTRAMUSCULAR | Status: AC
  Administered 2022-11-03: 40 mg via INTRAVENOUS
  Filled 2022-11-03: qty 4

## 2022-11-03 MED ORDER — DILTIAZEM HCL ER COATED BEADS 300 MG PO CP24
300.0000 mg | ORAL_CAPSULE | Freq: Every day | ORAL | Status: DC
  Administered 2022-11-04 – 2022-11-06 (×3): 300 mg via ORAL
  Filled 2022-11-03 (×3): qty 1

## 2022-11-03 MED ORDER — FUROSEMIDE 10 MG/ML IJ SOLN
40.0000 mg | Freq: Every day | INTRAMUSCULAR | Status: DC
  Administered 2022-11-04: 40 mg via INTRAVENOUS
  Filled 2022-11-03: qty 4

## 2022-11-03 MED ORDER — APIXABAN 5 MG PO TABS
5.0000 mg | ORAL_TABLET | Freq: Two times a day (BID) | ORAL | Status: DC
  Administered 2022-11-03 – 2022-11-06 (×6): 5 mg via ORAL
  Filled 2022-11-03 (×6): qty 1

## 2022-11-03 MED ORDER — METOPROLOL TARTRATE 25 MG PO TABS
25.0000 mg | ORAL_TABLET | Freq: Two times a day (BID) | ORAL | Status: DC
  Administered 2022-11-03 – 2022-11-06 (×6): 25 mg via ORAL
  Filled 2022-11-03 (×6): qty 1

## 2022-11-03 MED ORDER — AMIODARONE HCL 200 MG PO TABS
200.0000 mg | ORAL_TABLET | Freq: Two times a day (BID) | ORAL | Status: DC
  Administered 2022-11-03 – 2022-11-04 (×2): 200 mg via ORAL
  Filled 2022-11-03 (×2): qty 1

## 2022-11-03 MED ORDER — AMIODARONE HCL 200 MG PO TABS: 200.0000 mg | ORAL_TABLET | Freq: Two times a day (BID) | ORAL | Status: DC

## 2022-11-03 MED ORDER — POTASSIUM CHLORIDE CRYS ER 20 MEQ PO TBCR
20.0000 meq | EXTENDED_RELEASE_TABLET | Freq: Two times a day (BID) | ORAL | Status: DC
  Administered 2022-11-03 – 2022-11-04 (×2): 20 meq via ORAL
  Filled 2022-11-03 (×2): qty 1

## 2022-11-03 MED ORDER — ESCITALOPRAM OXALATE 10 MG PO TABS
5.0000 mg | ORAL_TABLET | Freq: Every day | ORAL | Status: DC
  Administered 2022-11-04 – 2022-11-06 (×3): 5 mg via ORAL
  Filled 2022-11-03 (×3): qty 1

## 2022-11-03 NOTE — ED Triage Notes (Signed)
Patient arrived from home via EMS due to SOB and bradycardia. Patient states she is having minor chest pain but can't tell if it's her shoulder pain radiating. Patient states she feels SOB, but always does. Patient wears 2L at baseline. EMS states she was started on amiodorone the 22nd and hasn't felt good sense. Patient is A&Ox4.

## 2022-11-03 NOTE — Assessment & Plan Note (Signed)
Continue with metoprolol and diltiazem

## 2022-11-03 NOTE — Assessment & Plan Note (Deleted)
-  Last echo 1/24 with EF 60-65% with grade II diastolic dysfunction -Troponin mildly elevated but lower than her priors. EKG in NSR. -has been compliant with Lasix at home -continue daily IV 40mg  Lasix -strict intake and output, daily weights

## 2022-11-03 NOTE — ED Notes (Signed)
Patient assisted to [facility].

## 2022-11-03 NOTE — ED Provider Notes (Signed)
[place] EMERGENCY DEPARTMENT AT [facility] Provider Note   CSN: [id number] Arrival date & time: 11/03/22  1704     History  Chief Complaint  Patient presents with   Shortness of Breath    [name] is a 89 y.o. female.  This is a 89 year old patient presents with chronic shortness of breath.  States that she has had more trouble breathing with last several days.  Notes that she is on oxygen chronically at 2 L.  No fever or chills.  Some increased congestion noted.  Patient states symptoms have been worse times several weeks after she was started on amiodarone.  She denies any syncope or near syncope.  Was found to be bradycardic at home today via EMS.  Heart rate was down into the 50s.  She was not dizzy with this.  Transported here for further evaluation       Home Medications Prior to Admission medications   Medication Sig Start Date End Date Taking? Authorizing Provider  acetaminophen (TYLENOL) 500 MG tablet Take 500 mg by mouth in the morning and at bedtime. 02/24/16   [provider]  albuterol (PROVENTIL HFA;VENTOLIN HFA) 108 (90 BASE) MCG/ACT inhaler Inhale 2 puffs into the lungs every 6 (six) hours as needed for wheezing.    [provider]  amiodarone (PACERONE) 200 MG tablet Take 1 tablet (200 mg total) by mouth 2 (two) times daily for 14 days, THEN 1 tablet (200 mg total) daily. 10/22/22 10/31/23  Eustace Pen, PA-C  apixaban (ELIQUIS) 5 MG TABS tablet Take 1 tablet (5 mg total) by mouth 2 (two) times daily. 07/29/22 10/22/22  Zigmund Daniel., MD  cetirizine (ZYRTEC) 10 MG tablet Take 10 mg by mouth at bedtime.    [provider]  diltiazem (CARDIZEM CD) 300 MG 24 hr capsule Take 1 capsule (300 mg total) by mouth daily. 08/08/22 10/22/22  Ghimire, Werner Lean, MD  escitalopram (LEXAPRO) 5 MG tablet Take 5 mg by mouth daily.    [provider]  fluticasone (FLONASE) 50 MCG/ACT nasal spray Place 1 spray into the nose  daily as needed for allergies.    [provider]  furosemide (LASIX) 40 MG tablet Take 1 tablet (40 mg total) by mouth daily. 08/09/22   Ghimire, Werner Lean, MD  LORazepam (ATIVAN) 0.5 MG tablet Take 0.5 mg by mouth 2 (two) times daily as needed for anxiety.    [provider]  Melatonin 5 MG CHEW Chew 5 mg by mouth at bedtime as needed (sleep).    [provider]  metoprolol tartrate (LOPRESSOR) 25 MG tablet Take 25 mg by mouth 2 (two) times daily.    [provider]  Multiple Vitamin (MULTIVITAMIN) tablet Take 1 tablet by mouth daily.    [provider]  nitroGLYCERIN (NITROSTAT) 0.4 MG SL tablet Place 1 tablet (0.4 mg total) under the tongue every 5 (five) minutes as needed for chest pain. 08/10/22   Jodelle Gross, NP  potassium chloride (KLOR-CON M) 10 MEQ tablet Take 2 tablets (20 mEq total) by mouth daily. Patient taking differently: Take 20 mEq by mouth 2 (two) times daily. 08/09/22   Ghimire, Werner Lean, MD      Allergies    Brilinta [ticagrelor], Lipitor [atorvastatin], Plavix [clopidogrel], Codeine, Mobic [meloxicam], Paxil [paroxetine], Sulfa antibiotics, and Sulfonamide derivatives    Review of Systems   Review of Systems  All other systems reviewed and are negative.   Physical Exam Updated  Vital Signs BP (!) 136/54 (BP Location: Right Arm)   Pulse (!) 59   Temp 99.5 F (37.5 C) (Oral)   Resp 19   Ht 1.499 m (4\' 11" )   Wt 64 kg   SpO2 100%   BMI 28.48 kg/m  Physical Exam Vitals and nursing note reviewed.  Constitutional:      General: She is not in acute distress.    Appearance: Normal appearance. She is well-developed. She is not toxic-appearing.  HENT:     Head: Normocephalic and atraumatic.  Eyes:     General: Lids are normal.     Conjunctiva/sclera: Conjunctivae normal.     Pupils: Pupils are equal, round, and reactive to light.  Neck:     Thyroid: No thyroid mass.     Trachea: No tracheal deviation.   Cardiovascular:     Rate and Rhythm: Normal rate and regular rhythm.     Heart sounds: Normal heart sounds. No murmur heard.    No gallop.  Pulmonary:     Effort: Pulmonary effort is normal. No respiratory distress.     Breath sounds: No stridor. Examination of the right-lower field reveals rhonchi. Examination of the left-lower field reveals rhonchi. Rhonchi present. No decreased breath sounds, wheezing or rales.  Abdominal:     General: There is no distension.     Palpations: Abdomen is soft.     Tenderness: There is no abdominal tenderness. There is no rebound.  Musculoskeletal:        General: No tenderness. Normal range of motion.     Cervical back: Normal range of motion and neck supple.  Skin:    General: Skin is warm and dry.     Findings: No abrasion or rash.  Neurological:     Mental Status: She is alert and oriented to person, place, and time. Mental status is at baseline.     GCS: GCS eye subscore is 4. GCS verbal subscore is 5. GCS motor subscore is 6.     Cranial Nerves: No cranial nerve deficit.     Sensory: No sensory deficit.     Motor: Motor function is intact.  Psychiatric:        Attention and Perception: Attention normal.        Speech: Speech normal.        Behavior: Behavior normal.     ED Results / Procedures / Treatments   Labs (all labs ordered are listed, but only abnormal results are displayed) Labs Reviewed  CBC WITH DIFFERENTIAL/PLATELET  COMPREHENSIVE METABOLIC PANEL  TROPONIN I (HIGH SENSITIVITY)    EKG EKG Interpretation  Date/Time:  Saturday Nov 03 2022 17:13:48 EDT Ventricular Rate:  59 PR Interval:  168 QRS Duration: 78 QT Interval:  434 QTC Calculation: 430 R Axis:   37 Text Interpretation: Sinus rhythm Low voltage, extremity and precordial leads No significant change since last tracing Confirmed by Lorre Nick ([id number]) on 11/03/2022 5:23:09 PM  Radiology DG Chest Port 1 View  Result Date: 11/03/2022 CLINICAL DATA:  Shortness  of breath EXAM: PORTABLE CHEST 1 VIEW COMPARISON:  08/06/2022 FINDINGS: Stable cardiomegaly. Aortic atherosclerotic calcification. Mild pulmonary vascular congestion bibasilar atelectasis or scarring. No definite pleural effusion. No pneumothorax. No displaced rib fractures. Advanced arthritis both shoulders. IMPRESSION: Cardiomegaly with mild pulmonary vascular congestion and bibasilar atelectasis or scarring. Electronically Signed   By: Minerva Fester M.D.   On: 11/03/2022 17:49    Procedures Procedures    Medications Ordered in ED Medications - No  data to display  ED Course/ Medical Decision Making/ A&P                             Medical Decision Making Amount and/or Complexity of Data Reviewed Labs: ordered. Radiology: ordered.  Risk Prescription drug management.   Patient is EKG per my interpretation shows no signs of acute coronary ischemia.  Chest x-ray shows evidence of CHF confirmed by elevated BNP.  Given Lasix 40 mg IV push.  Patient has mild acute kidney injury with creatinine at 1.5.  Mild troponin bump but patient's had this for the past.  Patient will require admission.  Will consult hospitalist team  CRITICAL CARE Performed by: Toy Baker Total critical care time: 50 minutes Critical care time was exclusive of separately billable procedures and treating other patients. Critical care was necessary to treat or prevent imminent or life-threatening deterioration. Critical care was time spent personally by me on the following activities: development of treatment plan with patient and/or surrogate as well as nursing, discussions with consultants, evaluation of patient's response to treatment, examination of patient, obtaining history from patient or surrogate, ordering and performing treatments and interventions, ordering and review of laboratory studies, ordering and review of radiographic studies, pulse oximetry and re-evaluation of patient's  condition.         Final Clinical Impression(s) / ED Diagnoses Final diagnoses:  None    Rx / DC Orders ED Discharge Orders     None         Lorre Nick, MD 11/03/22 2021

## 2022-11-03 NOTE — Assessment & Plan Note (Signed)
Hypokalemia.   Renal function with serum cr up to 1,55 with K at 3,4 and serum bicarbonate at 26. Na 136   Continue K correction with Kcl Follow up renal function in am. Holding loop diuretic today.

## 2022-11-03 NOTE — H&P (Signed)
History and Physical    Patient: [name] ZOX:[id number] DOB: [birth year] DOA: 11/03/2022 DOS: the patient was seen and examined on 11/03/2022 PCP: Thana Ates, MD  Patient coming from: Home  Chief Complaint:  Chief Complaint  Patient presents with   Shortness of Breath   HPI: DEMIANA AO is a 89 y.o. female with medical history significant of Hx of colon cancer s/p resection, HFpEF, Chronic hypoxic respiratory failure on 2L, CAD s/p PCI, HTN, HLD, a.fib on Eliquis who presents with dyspnea.   Pt very hard of hearing and limited historian. She lives alone and just reports feeling short of breath for a long time.  Spoke with daughter over the phone who provides history. Pt complained to family that she has more short of breath. Has been more fatigued although at baseline she is sedentary. She was recently started on amiodarone and has not felt good since then.   In the ED, she was afebrile, heart rate in the 60s to 70s normotensive and on baseline 2 L.  No leukocytosis, hemoglobin of 9.1 around her baseline.  Has elevated creatinine of 1.52.  BNP elevated to 567.  Troponin elevated but flat at 29 and 27.  Has been in the 40s in the past.  Chest x-ray showing cardiomegaly and vascular congestion.  EKG on my review normal sinus rhythm.  Review of Systems: unable to review all systems due to the inability of the patient to answer questions. Past Medical History:  Diagnosis Date   Adenomatous colon polyp MAY 2013   HOSPITALIZED AT [facility] WITH HGB 7 -TRANSFUSED-MASS FOUND IN ASCENDING COLON   Aortic sclerosis    With murmur   Asthma    CAD S/P percutaneous coronary angioplasty 2002, 2003,2014   a) Unstable Angina 7/'02: 1st Cutting PTCA- OM2 --> restenosed 9/'02 NSTEMI--> 3.0 mm x 12 mm BMS-OM 2; b) 11/'03 DOE w/ + Cardiolite - Cutter PTCA for ISR -- patent in 2004 (after False + Cardiolite); c) 07/2012 - Unstable Angina -- PCI to proximal RCA with Promus Premier DES 2.75 mm x 20  mm (3.0 mm), patent OM2 stent ~10% ISR (HARDING)   Complication of anesthesia 2009   HIP REPLACEMENT-PT HAD HARD TIME WAKING UP--FELT LIKE SHE COULDN'T BREATHE   Coronary stent restenosis due to scar tissue 05/2002   Cutting Balloon PTCA to OM2 BMS   Diverticulosis 10/2012   Seen on Colonoscopy   DJD (degenerative joint disease), thoracolumbar    Back, Hips & Knees   Essential hypertension    H/O Colon cancer 10/2011   GI - Drs. Leone Payor - colonoscopy 10/2012: diverticulosis, stable ileocolic anastomosis of R colon   Hyperlipidemia with target LDL less than 70    Internal hemorrhoids    Iron deficiency anemia due to chronic blood loss 10/2011   Blood Transfusion (EGD 09/2012 - unremarkable)   Medication intolerance - Plavix, Effient, Brilinta    Easy bruising, GI bleeds (led to Dx of Colon CA), Brilinta - dyspnea   Multiple lung nodules on CT 08/2012   scattered, bilateral but right>left.    Non-Q wave ST elevation myocardial infarction (STEMI) involving left circumflex coronary artery 03/2001   a) Severe thrombic ISR of prior PTCA site in OM2 --> BMS PCI; b) Echo 08/2012: Normal LV size & function.  EF 55-60%, no regional WMA, Gr 1 DD, mild MR, mild-mod TR - NO Pulmonary HTN   Osteoarthritis (arthritis due to wear and tear of joints)    Bilateral Hip Arthroplasty,  L Knee TKA   Osteoporosis    Past Surgical History:  Procedure Laterality Date   APPENDECTOMY     CHOLECYSTECTOMY     COLONOSCOPY  11/16/2011   Procedure: COLONOSCOPY;  Surgeon: Iva Boop, MD;  Location: [facility] ENDOSCOPY;  Service: Endoscopy;  Laterality: N/A;   COLONOSCOPY  10/2012   Leone Payor: diverticulosis, stable R colon ileocolic anastomosis   COLONOSCOPY W/ BIOPSIES AND POLYPECTOMY  06/15/2008   adenomatous polyps, diverticulosis, internal hemorrhoids   COLONOSCOPY WITH PROPOFOL N/A 07/18/2015   Procedure: COLONOSCOPY WITH PROPOFOL;  Surgeon: Iva Boop, MD;  Location: [facility] ENDOSCOPY;  Service: Endoscopy;  Laterality: N/A;    CORONARY ANGIOPLASTY WITH STENT PLACEMENT  2002; 2003; 07/2012   a)  7/'02: 1st Cutting PTCA- OM2 --> restenosed 9/'02 --> 3.0 mm x 12 mm BMS-OM 2; b) 11/'03 - Cutter PTCA for ISR -- patent in 2004;    DILATATION & CURETTAGE/HYSTEROSCOPY WITH MYOSURE N/A 03/21/2022   Procedure: DILATATION & CURETTAGE/HYSTEROSCOPY WITH MYOSURE, PAP SMEAR;  Surgeon: Steva Ready, DO;  Location: [facility] OR;  Service: Gynecology;  Laterality: N/A;   ESOPHAGOGASTRODUODENOSCOPY N/A 10/23/2012   Procedure: ESOPHAGOGASTRODUODENOSCOPY (EGD);  Surgeon: Louis Meckel, MD;  Location: [facility] ENDOSCOPY;  Service: Endoscopy;  Laterality: N/A;   INCISIONAL HERNIA REPAIR N/A 01/29/2013   Procedure: LAPAROSCOPIC INCISIONAL HERNIA;  Surgeon: Shelly Rubenstein, MD;  Location: [facility] ORS;  Service: General;  Laterality: N/A;   INSERTION OF MESH N/A 01/29/2013   Procedure: INSERTION OF MESH;  Surgeon: Shelly Rubenstein, MD;  Location: [facility] ORS;  Service: General;  Laterality: N/A;   LAPAROSCOPY  03/21/2022   Procedure: DIAGNOSTIC LAPAROSCOPY;  Surgeon: Steva Ready, DO;  Location: [facility] OR;  Service: Gynecology;;   LEFT HEART CATHETERIZATION WITH CORONARY ANGIOGRAM N/A 07/15/2012   Procedure: LEFT HEART CATHETERIZATION WITH CORONARY ANGIOGRAM;  Surgeon: Marykay Lex, MD;  Location: [facility] CATH LAB: pRCA 80-90%, OM2 BMS ~10% ISR   NM MYOVIEW LTD  08/2014    Normal LV size and function. Normal wall motion. LOW RISK.   PERCUTANEOUS CORONARY STENT INTERVENTION (PCI-S)  07/15/2012   Procedure: PERCUTANEOUS CORONARY STENT INTERVENTION (PCI-S);  Surgeon: Marykay Lex, MD;  Location: [facility] CATH LAB: c) PCI to proximal RCA with Promus Premier DES 2.75 mm x 20 mm (3.0 mm)   REPLACEMENT TOTAL KNEE Left 2003   TOTAL HIP ARTHROPLASTY  09/08/2007   left, Dr. Lequita Halt   TOTAL HIP ARTHROPLASTY Right 2003   TRANSTHORACIC ECHOCARDIOGRAM  08/2012   Normal LV size & function.  EF 55-60%, no regional WMA, Gr 1 DD, mild MR, mild-mod TR; aortic sclerosis without  stenosis   Social History:  reports that she quit smoking about 40 years ago. Her smoking use included cigarettes. She has never used smokeless tobacco. She reports current alcohol use. She reports that she does not use drugs.  Allergies  Allergen Reactions   Brilinta [Ticagrelor] Shortness Of Breath    Bleeding    Lipitor [Atorvastatin] Shortness Of Breath and Other (See Comments)    Myalgias    Plavix [Clopidogrel] Shortness Of Breath and Other (See Comments)    Bleeding    Codeine Nausea And Vomiting   Mobic [Meloxicam] Other (See Comments)    Stomach upset   Paxil [Paroxetine] Other (See Comments)    Stomach upset Myalgias  Chills    Sulfa Antibiotics Diarrhea and Other (See Comments)    Stomach upset   Sulfonamide Derivatives Diarrhea and Other (See Comments)    Stomach upset  Family History  Problem Relation Age of Onset   Pancreatic cancer Father    Prostate cancer Father    Skin cancer Father    Heart disease Mother    Hypertension Mother    Pancreatic cancer Brother    Heart disease Sister    Colon cancer Brother        ? Colostomy   Prostate cancer Brother    Colon cancer Sister    Melanoma Brother     Prior to Admission medications   Medication Sig Start Date End Date Taking? Authorizing Provider  acetaminophen (TYLENOL) 500 MG tablet Take 500 mg by mouth in the morning and at bedtime. 02/24/16   [provider]  albuterol (PROVENTIL HFA;VENTOLIN HFA) 108 (90 BASE) MCG/ACT inhaler Inhale 2 puffs into the lungs every 6 (six) hours as needed for wheezing.    [provider]  amiodarone (PACERONE) 200 MG tablet Take 1 tablet (200 mg total) by mouth 2 (two) times daily for 14 days, THEN 1 tablet (200 mg total) daily. 10/22/22 10/31/23  Eustace Pen, PA-C  apixaban (ELIQUIS) 5 MG TABS tablet Take 1 tablet (5 mg total) by mouth 2 (two) times daily. 07/29/22 10/22/22  Zigmund Daniel., MD  cetirizine (ZYRTEC) 10 MG tablet Take 10 mg by mouth  at bedtime.    [provider]  diltiazem (CARDIZEM CD) 300 MG 24 hr capsule Take 1 capsule (300 mg total) by mouth daily. 08/08/22 10/22/22  Ghimire, Werner Lean, MD  escitalopram (LEXAPRO) 5 MG tablet Take 5 mg by mouth daily.    [provider]  fluticasone (FLONASE) 50 MCG/ACT nasal spray Place 1 spray into the nose daily as needed for allergies.    [provider]  furosemide (LASIX) 40 MG tablet Take 1 tablet (40 mg total) by mouth daily. 08/09/22   Ghimire, Werner Lean, MD  LORazepam (ATIVAN) 0.5 MG tablet Take 0.5 mg by mouth 2 (two) times daily as needed for anxiety.    [provider]  Melatonin 5 MG CHEW Chew 5 mg by mouth at bedtime as needed (sleep).    [provider]  metoprolol tartrate (LOPRESSOR) 25 MG tablet Take 25 mg by mouth 2 (two) times daily.    [provider]  Multiple Vitamin (MULTIVITAMIN) tablet Take 1 tablet by mouth daily.    [provider]  nitroGLYCERIN (NITROSTAT) 0.4 MG SL tablet Place 1 tablet (0.4 mg total) under the tongue every 5 (five) minutes as needed for chest pain. 08/10/22   Jodelle Gross, NP  potassium chloride (KLOR-CON M) 10 MEQ tablet Take 2 tablets (20 mEq total) by mouth daily. Patient taking differently: Take 20 mEq by mouth 2 (two) times daily. 08/09/22   Maretta Bees, MD    Physical Exam: Vitals:   11/03/22 1845 11/03/22 1915 11/03/22 2104 11/03/22 2215  BP: (!) 137/57 (!) 139/59  128/77  Pulse: 70 72  79  Resp: 16 15  18   Temp:   98.9 F (37.2 C)   TempSrc:   Oral   SpO2: 100% 100%  100%  Weight:      Height:       Constitutional: NAD, calm, comfortable, elderly female laying in bed Eyes: lids and conjunctivae normal ENMT: Mucous membranes are moist.  Neck: normal, supple Respiratory: clear to auscultation bilaterally, no wheezing, no crackles. Normal respiratory effort. No accessory muscle use.  Cardiovascular: Regular rate and rhythm, no murmurs / rubs / gallops. No  extremity  edema. 2+ pedal pulses.  Abdomen: Soft, nontender with suprapubic tenderness musculoskeletal: no clubbing / cyanosis. No joint deformity upper and lower extremities. Good ROM, no contractures. Normal muscle tone.  Skin: no rashes, lesions, ulcers.  Neurologic: CN 2-12 grossly intact. Psychiatric: Normal judgment and insight. Alert and oriented x 3.  Frustrated mood. Data Reviewed:  See HPI  Assessment and Plan: * Acute on chronic diastolic CHF (congestive heart failure) (HCC) -Last echo 1/24 with EF 60-65% with grade II diastolic dysfunction -Troponin mildly elevated but lower than her priors. EKG in NSR. -has been compliant with Lasix at home -continue daily IV 40mg  Lasix -strict intake and output, daily weights  Chronic hypoxic respiratory failure (HCC) -stable and remains on home 2L  Paroxysmal A-fib (HCC) -pt first diagnosed with a.fib with RVR in January and started on diltiazem and Eliquis.  She then was readmitted in February for atrial fibrillation with RVR and had adjustment of her diltiazem.  Has been following up with cardiology and has since had metoprolol added back on, also was started on amiodarone.  -continue metoprolol, diltiazem and amiodarone for now but may need to re-address with cardiology as to whether she needs both rate and rhythm control. Current in NSR.   Essential hypertension -continue home metoprolol and diltiazem  CAD S/P PCI: OM2 PTCA 7/02, OM2 Stent 9/02, ISR 11/03 - PTCA; RCA DES 1 /14/14 -continue Eliquis  AKI (acute kidney injury) (HCC) -creatinine elevated to 1.52. Possibly cardiorenal with CHF exacerbation -follow closely while on IV Lasix      Advance Care Planning: Full  Consults: none  Family Communication: discussed with daughter over the phone  Severity of Illness: The appropriate patient status for this patient is INPATIENT. Inpatient status is judged to be reasonable and necessary in order to provide the required  intensity of service to ensure the patient's safety. The patient's presenting symptoms, physical exam findings, and initial radiographic and laboratory data in the context of their chronic comorbidities is felt to place them at high risk for further clinical deterioration. Furthermore, it is not anticipated that the patient will be medically stable for discharge from the hospital within 2 midnights of admission.   * I certify that at the point of admission it is my clinical judgment that the patient will require inpatient hospital care spanning beyond 2 midnights from the point of admission due to high intensity of service, high risk for further deterioration and high frequency of surveillance required.*  Author: Anselm Jungling, DO 11/03/2022 10:33 PM  For on call review [email or web address].

## 2022-11-03 NOTE — ED Notes (Signed)
ED TO INPATIENT HANDOFF REPORT  ED Nurse Name and Phone #:  [phone] [name] Name/Age/Gender [name] 89 y.o. female Room/Bed: 032C/032C  Code Status   Code Status: Prior  Home/SNF/Other Home Patient oriented to: self, place, time, and situation Is this baseline? Yes   Triage Complete: Triage complete  Chief Complaint Acute on chronic diastolic heart failure (HCC) [I50.33]  Triage Note Patient arrived from home via EMS due to SOB and bradycardia. Patient states she is having minor chest pain but can't tell if it's her shoulder pain radiating. Patient states she feels SOB, but always does. Patient wears 2L at baseline. EMS states she was started on amiodorone the 22nd and hasn't felt good sense. Patient is A&Ox4.   Allergies Allergies  Allergen Reactions   Brilinta [Ticagrelor] Shortness Of Breath    Bleeding    Lipitor [Atorvastatin] Shortness Of Breath and Other (See Comments)    Myalgias    Plavix [Clopidogrel] Shortness Of Breath and Other (See Comments)    Bleeding    Codeine Nausea And Vomiting   Mobic [Meloxicam] Other (See Comments)    Stomach upset   Paxil [Paroxetine] Other (See Comments)    Stomach upset Myalgias  Chills    Sulfa Antibiotics Diarrhea and Other (See Comments)    Stomach upset   Sulfonamide Derivatives Diarrhea and Other (See Comments)    Stomach upset    Level of Care/Admitting Diagnosis ED Disposition     ED Disposition  Admit   Condition  --   Comment  Hospital Area: [facility] [[id number]]  Level of Care: Telemetry Cardiac [[id number]]  May admit patient to [facility] or [facility] if equivalent level of care is available:: No  Covid Evaluation: Asymptomatic - no recent exposure (last 10 days) testing not required  Diagnosis: Acute on chronic diastolic heart failure (HCC) [428.33.ICD-9-CM]  Admitting Physician: Anselm Jungling [[id number]]  Attending Physician: Anselm Jungling [[id number]]  Certification:: I certify this  patient will need inpatient services for at least 2 midnights  Estimated Length of Stay: 3          B Medical/Surgery History Past Medical History:  Diagnosis Date   Adenomatous colon polyp MAY 2013   HOSPITALIZED AT [facility] WITH HGB 7 -TRANSFUSED-MASS FOUND IN ASCENDING COLON   Aortic sclerosis    With murmur   Asthma    CAD S/P percutaneous coronary angioplasty 2002, 2003,2014   a) Unstable Angina 7/'02: 1st Cutting PTCA- OM2 --> restenosed 9/'02 NSTEMI--> 3.0 mm x 12 mm BMS-OM 2; b) 11/'03 DOE w/ + Cardiolite - Cutter PTCA for ISR -- patent in 2004 (after False + Cardiolite); c) 07/2012 - Unstable Angina -- PCI to proximal RCA with Promus Premier DES 2.75 mm x 20 mm (3.0 mm), patent OM2 stent ~10% ISR (HARDING)   Complication of anesthesia 2009   HIP REPLACEMENT-PT HAD HARD TIME WAKING UP--FELT LIKE SHE COULDN'T BREATHE   Coronary stent restenosis due to scar tissue 05/2002   Cutting Balloon PTCA to OM2 BMS   Diverticulosis 10/2012   Seen on Colonoscopy   DJD (degenerative joint disease), thoracolumbar    Back, Hips & Knees   Essential hypertension    H/O Colon cancer 10/2011   GI - Drs. Leone Payor - colonoscopy 10/2012: diverticulosis, stable ileocolic anastomosis of R colon   Hyperlipidemia with target LDL less than 70    Internal hemorrhoids    Iron deficiency anemia due to chronic blood loss 10/2011  Blood Transfusion (EGD 09/2012 - unremarkable)   Medication intolerance - Plavix, Effient, Brilinta    Easy bruising, GI bleeds (led to Dx of Colon CA), Brilinta - dyspnea   Multiple lung nodules on CT 08/2012   scattered, bilateral but right>left.    Non-Q wave ST elevation myocardial infarction (STEMI) involving left circumflex coronary artery 03/2001   a) Severe thrombic ISR of prior PTCA site in OM2 --> BMS PCI; b) Echo 08/2012: Normal LV size & function.  EF 55-60%, no regional WMA, Gr 1 DD, mild MR, mild-mod TR - NO Pulmonary HTN   Osteoarthritis (arthritis due to wear and tear of  joints)    Bilateral Hip Arthroplasty, L Knee TKA   Osteoporosis    Past Surgical History:  Procedure Laterality Date   APPENDECTOMY     CHOLECYSTECTOMY     COLONOSCOPY  11/16/2011   Procedure: COLONOSCOPY;  Surgeon: Iva Boop, MD;  Location: [facility] ENDOSCOPY;  Service: Endoscopy;  Laterality: N/A;   COLONOSCOPY  10/2012   Leone Payor: diverticulosis, stable R colon ileocolic anastomosis   COLONOSCOPY W/ BIOPSIES AND POLYPECTOMY  06/15/2008   adenomatous polyps, diverticulosis, internal hemorrhoids   COLONOSCOPY WITH PROPOFOL N/A 07/18/2015   Procedure: COLONOSCOPY WITH PROPOFOL;  Surgeon: Iva Boop, MD;  Location: [facility] ENDOSCOPY;  Service: Endoscopy;  Laterality: N/A;   CORONARY ANGIOPLASTY WITH STENT PLACEMENT  2002; 2003; 07/2012   a)  7/'02: 1st Cutting PTCA- OM2 --> restenosed 9/'02 --> 3.0 mm x 12 mm BMS-OM 2; b) 11/'03 - Cutter PTCA for ISR -- patent in 2004;    DILATATION & CURETTAGE/HYSTEROSCOPY WITH MYOSURE N/A 03/21/2022   Procedure: DILATATION & CURETTAGE/HYSTEROSCOPY WITH MYOSURE, PAP SMEAR;  Surgeon: Steva Ready, DO;  Location: [facility] OR;  Service: Gynecology;  Laterality: N/A;   ESOPHAGOGASTRODUODENOSCOPY N/A 10/23/2012   Procedure: ESOPHAGOGASTRODUODENOSCOPY (EGD);  Surgeon: Louis Meckel, MD;  Location: [facility] ENDOSCOPY;  Service: Endoscopy;  Laterality: N/A;   INCISIONAL HERNIA REPAIR N/A 01/29/2013   Procedure: LAPAROSCOPIC INCISIONAL HERNIA;  Surgeon: Shelly Rubenstein, MD;  Location: [facility] ORS;  Service: General;  Laterality: N/A;   INSERTION OF MESH N/A 01/29/2013   Procedure: INSERTION OF MESH;  Surgeon: Shelly Rubenstein, MD;  Location: [facility] ORS;  Service: General;  Laterality: N/A;   LAPAROSCOPY  03/21/2022   Procedure: DIAGNOSTIC LAPAROSCOPY;  Surgeon: Steva Ready, DO;  Location: [facility] OR;  Service: Gynecology;;   LEFT HEART CATHETERIZATION WITH CORONARY ANGIOGRAM N/A 07/15/2012   Procedure: LEFT HEART CATHETERIZATION WITH CORONARY ANGIOGRAM;  Surgeon: Marykay Lex, MD;   Location: [facility] CATH LAB: pRCA 80-90%, OM2 BMS ~10% ISR   NM MYOVIEW LTD  08/2014    Normal LV size and function. Normal wall motion. LOW RISK.   PERCUTANEOUS CORONARY STENT INTERVENTION (PCI-S)  07/15/2012   Procedure: PERCUTANEOUS CORONARY STENT INTERVENTION (PCI-S);  Surgeon: Marykay Lex, MD;  Location: [facility] CATH LAB: c) PCI to proximal RCA with Promus Premier DES 2.75 mm x 20 mm (3.0 mm)   REPLACEMENT TOTAL KNEE Left 2003   TOTAL HIP ARTHROPLASTY  09/08/2007   left, Dr. Lequita Halt   TOTAL HIP ARTHROPLASTY Right 2003   TRANSTHORACIC ECHOCARDIOGRAM  08/2012   Normal LV size & function.  EF 55-60%, no regional WMA, Gr 1 DD, mild MR, mild-mod TR; aortic sclerosis without stenosis     A IV Location/Drains/Wounds Patient Lines/Drains/Airways Status     Active Line/Drains/Airways     Name Placement date Placement time Site Days   Peripheral IV 11/03/22  18 G Left Antecubital 11/03/22  --  Antecubital  less than 1   External Urinary Catheter 08/06/22  1446  --  89   Incision - 3 Ports Abdomen 1: Mid;Upper 2: Left;Lateral;Lower 3: Left;Lateral;Upper 03/21/22  1106  -- 227            Intake/Output Last 24 hours No intake or output data in the 24 hours ending 11/03/22 2128  Labs/Imaging Results for orders placed or performed during the hospital encounter of 11/03/22 (from the past 48 hour(s))  CBC with Differential/Platelet     Status: Abnormal   Collection Time: 11/03/22  5:28 PM  Result Value Ref Range   WBC 8.7 4.0 - 10.5 K/uL   RBC 3.10 (L) 3.87 - 5.11 MIL/uL   Hemoglobin 9.1 (L) 12.0 - 15.0 g/dL   HCT 16.1 (L) 09.6 - 04.5 %   MCV 94.8 80.0 - 100.0 fL   MCH 29.4 26.0 - 34.0 pg   MCHC 31.0 30.0 - 36.0 g/dL   RDW 40.9 81.1 - 91.4 %   Platelets 236 150 - 400 K/uL   nRBC 0.0 0.0 - 0.2 %   Neutrophils Relative % 81 %   Neutro Abs 7.0 1.7 - 7.7 K/uL   Lymphocytes Relative 12 %   Lymphs Abs 1.1 0.7 - 4.0 K/uL   Monocytes Relative 6 %   Monocytes Absolute 0.5 0.1 - 1.0 K/uL    Eosinophils Relative 1 %   Eosinophils Absolute 0.1 0.0 - 0.5 K/uL   Basophils Relative 0 %   Basophils Absolute 0.0 0.0 - 0.1 K/uL   Immature Granulocytes 0 %   Abs Immature Granulocytes 0.03 0.00 - 0.07 K/uL    Comment: Performed at [facility], [address] [place], [place] [address]  Comprehensive metabolic panel     Status: Abnormal   Collection Time: 11/03/22  5:28 PM  Result Value Ref Range   Sodium 139 135 - 145 mmol/L   Potassium 4.4 3.5 - 5.1 mmol/L   Chloride 102 98 - 111 mmol/L   CO2 22 22 - 32 mmol/L   Glucose, Bld 145 (H) 70 - 99 mg/dL    Comment: Glucose reference range applies only to samples taken after fasting for at least 8 hours.   BUN 27 (H) 8 - 23 mg/dL   Creatinine, Ser 6.21 (H) 0.44 - 1.00 mg/dL   Calcium 9.5 8.9 - 30.8 mg/dL   Total Protein 6.7 6.5 - 8.1 g/dL   Albumin 3.8 3.5 - 5.0 g/dL   AST 25 15 - 41 U/L   ALT 11 0 - 44 U/L   Alkaline Phosphatase 44 38 - 126 U/L   Total Bilirubin 0.9 0.3 - 1.2 mg/dL   GFR, Estimated 32 (L) >60 mL/min    Comment: (NOTE) Calculated using the CKD-EPI Creatinine Equation (2021)    Anion gap 15 5 - 15    Comment: Performed at [facility], [address] [place], [place] [address]  Troponin I (High Sensitivity)     Status: Abnormal   Collection Time: 11/03/22  5:28 PM  Result Value Ref Range   Troponin I (High Sensitivity) 29 (H) <18 ng/L    Comment: (NOTE) Elevated high sensitivity troponin I (hsTnI) values and significant  changes across serial measurements may suggest ACS but many other  chronic and acute conditions are known to elevate hsTnI results.  Refer to the "Links" section for chest pain algorithms and additional  guidance. Performed at  [facility], [address] [place], [place] [address]   Brain natriuretic peptide     Status: Abnormal   Collection Time: 11/03/22  5:28 PM  Result Value Ref Range   B Natriuretic Peptide 567.8 (H) 0.0 - 100.0 pg/mL    Comment: Performed at  [facility], [address], [place], [place] [address]  Troponin I (High Sensitivity)     Status: Abnormal   Collection Time: 11/03/22  7:28 PM  Result Value Ref Range   Troponin I (High Sensitivity) 27 (H) <18 ng/L    Comment: (NOTE) Elevated high sensitivity troponin I (hsTnI) values and significant  changes across serial measurements may suggest ACS but many other  chronic and acute conditions are known to elevate hsTnI results.  Refer to the "Links" section for chest pain algorithms and additional  guidance. Performed at [facility], [address], [place], [place] [address]    DG Chest Port 1 View  Result Date: 11/03/2022 CLINICAL DATA:  Shortness of breath EXAM: PORTABLE CHEST 1 VIEW COMPARISON:  08/06/2022 FINDINGS: Stable cardiomegaly. Aortic atherosclerotic calcification. Mild pulmonary vascular congestion bibasilar atelectasis or scarring. No definite pleural effusion. No pneumothorax. No displaced rib fractures. Advanced arthritis both shoulders. IMPRESSION: Cardiomegaly with mild pulmonary vascular congestion and bibasilar atelectasis or scarring. Electronically Signed   By: Minerva Fester M.D.   On: 11/03/2022 17:49    Pending Labs Unresulted Labs (From admission, onward)    None       Vitals/Pain Today's Vitals   11/03/22 1815 11/03/22 1845 11/03/22 1915 11/03/22 2104  BP: (!) 137/55 (!) 137/57 (!) 139/59   Pulse: 66 70 72   Resp: (!) 21 16 15    Temp:    98.9 F (37.2 C)  TempSrc:    Oral  SpO2: 100% 100% 100%   Weight:      Height:      PainSc:        Isolation Precautions No active isolations  Medications Medications  furosemide (LASIX) injection 40 mg (40 mg Intravenous Given 11/03/22 2018)    Mobility walks with device     Focused Assessments Cardiac Assessment Handoff:  Cardiac Rhythm: Sinus bradycardia Lab Results  Component Value Date   CKTOTAL 82 11/14/2011   CKMB 2.5 11/14/2011   TROPONINI <0.03 09/16/2016   No  results found for: "DDIMER" Does the Patient currently have chest pain? No    R Recommendations: See Admitting Provider Note  Report given to:   Additional Notes:

## 2022-11-03 NOTE — Assessment & Plan Note (Signed)
-  stable and remains on home 2L

## 2022-11-03 NOTE — Assessment & Plan Note (Deleted)
-  pt first diagnosed with a.fib with RVR in January and started on diltiazem and Eliquis.  She then was readmitted in February for atrial fibrillation with RVR and had adjustment of her diltiazem.  Has been following up with cardiology and has since had metoprolol added back on, also was started on amiodarone.  -continue metoprolol, diltiazem and amiodarone for now but may need to re-address with cardiology as to whether she needs both rate and rhythm control. Current in NSR.

## 2022-11-03 NOTE — Assessment & Plan Note (Signed)
No chest pain, continue blood pressure control.  

## 2022-11-03 NOTE — ED Notes (Signed)
Updated son on room number patient will be going to.

## 2022-11-04 DIAGNOSIS — N179 Acute kidney failure, unspecified: Secondary | ICD-10-CM | POA: Diagnosis not present

## 2022-11-04 DIAGNOSIS — I10 Essential (primary) hypertension: Secondary | ICD-10-CM | POA: Diagnosis not present

## 2022-11-04 DIAGNOSIS — I5033 Acute on chronic diastolic (congestive) heart failure: Secondary | ICD-10-CM | POA: Diagnosis not present

## 2022-11-04 DIAGNOSIS — I480 Paroxysmal atrial fibrillation: Secondary | ICD-10-CM | POA: Diagnosis not present

## 2022-11-04 LAB — BASIC METABOLIC PANEL
Anion gap: 13 (ref 5–15)
BUN: 22 mg/dL (ref 8–23)
CO2: 27 mmol/L (ref 22–32)
Calcium: 9.5 mg/dL (ref 8.9–10.3)
Chloride: 98 mmol/L (ref 98–111)
Creatinine, Ser: 1.35 mg/dL — ABNORMAL HIGH (ref 0.44–1.00)
GFR, Estimated: 37 mL/min — ABNORMAL LOW (ref >60)
Glucose, Bld: 164 mg/dL — ABNORMAL HIGH (ref 70–99)
Potassium: 3.2 mmol/L — ABNORMAL LOW (ref 3.5–5.1)
Sodium: 138 mmol/L (ref 135–145)

## 2022-11-04 MED ORDER — ACETAMINOPHEN 325 MG PO TABS
650.0000 mg | ORAL_TABLET | Freq: Four times a day (QID) | ORAL | Status: DC | PRN
  Administered 2022-11-04 – 2022-11-06 (×8): 650 mg via ORAL
  Filled 2022-11-04 (×9): qty 2

## 2022-11-04 MED ORDER — FUROSEMIDE 10 MG/ML IJ SOLN
40.0000 mg | Freq: Two times a day (BID) | INTRAMUSCULAR | Status: DC
  Administered 2022-11-04: 40 mg via INTRAVENOUS
  Filled 2022-11-04: qty 4

## 2022-11-04 MED ORDER — POTASSIUM CHLORIDE CRYS ER 20 MEQ PO TBCR
40.0000 meq | EXTENDED_RELEASE_TABLET | Freq: Once | ORAL | Status: AC
  Administered 2022-11-04: 40 meq via ORAL
  Filled 2022-11-04: qty 2

## 2022-11-04 NOTE — Assessment & Plan Note (Addendum)
I suspect patient is having symptomatic bradycardia. Heart rate has improved up to 70 and 80.   Continue to hold on amiodarone, but continue with metoprolol and diltiazem. Continue anticoagulation with apixaban (reduced dose for age and GFR).  I have made an appointment with the a fib clinic for next week. She may need a home cardiac monitor to assess a fib burden, for now will continue to hold on amiodarone to avoid bradycardia.

## 2022-11-04 NOTE — Progress Notes (Addendum)
Progress Note   Patient: [name] ZOX:[id number] DOB: [birth year] DOA: 11/03/2022     1 DOS: the patient was seen and examined on 11/04/2022   Brief hospital course: Mrs. Mathern was admitted to the hospital with the working diagnosis of heart failure decompensation.   89 yo female with the past medical history of colon cancer sp resection, heart failure, hypertension, coronary artery disease, atrial fibrillation, dyslipidemia and chronic hypoxemic respiratory failure who presented with dyspnea. Patient with very poor physical functional status, reported several days of worsening dyspnea, and more fatigued, that prompted her visit to the ED. She was recently started on amiodarone.  On her initial physical examination her blood pressure was 137/57, HR 70, RR 16 and 02 saturation 100%, lungs with no rales or wheezing, heart with S1 and S2 present, regular rhythm, abdomen with no distention and no lower extremity edema.   Na 139, K 4,4 CL 102 bicarbonate 22, glucose 145, bun 27 cr 1,52  BNP 567 High sensitive troponin 29 and 27  Wbc 8,7 hgb 9,1 plt 236    Chest radiograph with left rotation, cardiomegaly with mild hilar vascular congestion. No effusions or infiltrates.   EKG 59 bpm, normal axis, qtc less than 500, sinus rhythm with poor R R wave progression, no significant ST segment or T wave changes.   Patient was placed on IV furosemide for diuresis.   Assessment and Plan: * Acute on chronic diastolic CHF (congestive heart failure) (HCC) Echocardiogram with preserved LV systolic function with EF 60 to 65%, borderline LVH, RV systolic function preserved, atriums with normal size, moderate mitral regurgitation.   Urine output is 1,300 cc Systolic blood pressure is 117 to 139 mmHg.  Continue to have hypervolemia with rales and JVD.  Plan to continue diuresis with IV furosemide 40 mg IV q12 hrs Continue with metoprolol and diltiazem.   Paroxysmal A-fib (HCC) Patient continue in sinus  rhythm with rate in the 60's. I suspect patient is having symptomatic bradycardia.  Plan to discontinue amiodarone, but continue with metoprolol and diltiazem. Continue anticoagulation with apixaban. Continue telemetry monitoring.  Follow up with a fib clinic as outpatient, to consider home cardiac monitor.   AKI (acute kidney injury) (HCC) Hypokalemia.   Renal function with serum cr at 1.35 with K at 3,2 and serum bicarbonate at 27, Na 138  Plan to continue diuresis with IV furosemide.  Continue K correction with Kcl.  Follow up renal function and electrolytes in am.  Avoid hypotension and nephrotoxic medications.   Essential hypertension Continue with metoprolol and diltiazem  CAD S/P PCI: OM2 PTCA 7/02, OM2 Stent 9/02, ISR 11/03 - PTCA; RCA DES 1 /14/14 No chest pain, continue blood pressure control.   Chronic hypoxic respiratory failure (HCC) -stable and remains on home 2L        Subjective: Patient with improvement in her symptoms but not back to baseline, has not been very mobile.   Physical Exam: Vitals:   11/03/22 2311 11/04/22 0550 11/04/22 0740 11/04/22 1052  BP: (!) 141/65 (!) 117/40 (!) 137/56 (!) 139/58  Pulse: 78 76 78 77  Resp: 18 18 18 16   Temp: 97.9 F (36.6 C) 98 F (36.7 C) 97.7 F (36.5 C) 98.4 F (36.9 C)  TempSrc: Oral Oral Oral Oral  SpO2: 100% 96% 99% 98%  Weight:  64.1 kg    Height:       Neurology awake and alert ENT with mild pallor Cardiovascular with S1 and S2 present and bradycardic  with no gallops, or rubs, positive murmur systolic at the apex Positive moderate JVD No lower extremity edema Respiratory with positive rales with no wheezing Abdomen with no distention  Data Reviewed:    Family Communication: I spoke with patient's daughter at the bedside, we talked in detail about patient's condition, plan of care and prognosis and all questions were addressed.   Disposition: Status is: Inpatient Remains inpatient  appropriate because: heart failure  Planned Discharge Destination: Home    Author: Coralie Keens, MD 11/04/2022 2:21 PM  For on call review [email or web address].

## 2022-11-04 NOTE — Plan of Care (Signed)

## 2022-11-04 NOTE — Assessment & Plan Note (Addendum)
Echocardiogram with preserved LV systolic function with EF 60 to 65%, borderline LVH, RV systolic function preserved, atriums with normal size, moderate mitral regurgitation.   Urine output is 1,700 cc Systolic blood pressure is 130 to 149 mmHg.   Volume status has improved, plan to hold on furosemide for today.  Continue with metoprolol and diltiazem.

## 2022-11-04 NOTE — Hospital Course (Signed)
[name] was admitted to the hospital with the working diagnosis of heart failure decompensation.   89 yo female with the past medical history of colon cancer sp resection, heart failure, hypertension, coronary artery disease, atrial fibrillation, dyslipidemia and chronic hypoxemic respiratory failure who presented with dyspnea. Patient with very poor physical functional status, reported several days of worsening dyspnea, and more fatigued, that prompted her visit to the ED. She was recently started on amiodarone.  On her initial physical examination her blood pressure was 137/57, HR 70, RR 16 and 02 saturation 100%, lungs with no rales or wheezing, heart with S1 and S2 present, regular rhythm, abdomen with no distention and no lower extremity edema.   Na 139, K 4,4 CL 102 bicarbonate 22, glucose 145, bun 27 cr 1,52  BNP 567 High sensitive troponin 29 and 27  Wbc 8,7 hgb 9,1 plt 236    Chest radiograph with left rotation, cardiomegaly with mild hilar vascular congestion. No effusions or infiltrates.   EKG 59 bpm, normal axis, qtc less than 500, sinus rhythm with poor R R wave progression, no significant ST segment or T wave changes.   Patient was placed on IV furosemide for diuresis.  Amiodarone was held due to bradycardia.   05/07 volume status has improved, patient will continue to hold on amiodarone and will follow up with atrial fibrillation clinic.

## 2022-11-05 DIAGNOSIS — N179 Acute kidney failure, unspecified: Secondary | ICD-10-CM | POA: Diagnosis not present

## 2022-11-05 DIAGNOSIS — I480 Paroxysmal atrial fibrillation: Secondary | ICD-10-CM | POA: Diagnosis not present

## 2022-11-05 DIAGNOSIS — I5033 Acute on chronic diastolic (congestive) heart failure: Secondary | ICD-10-CM | POA: Diagnosis not present

## 2022-11-05 DIAGNOSIS — I10 Essential (primary) hypertension: Secondary | ICD-10-CM | POA: Diagnosis not present

## 2022-11-05 LAB — BASIC METABOLIC PANEL
Anion gap: 10 (ref 5–15)
BUN: 27 mg/dL — ABNORMAL HIGH (ref 8–23)
CO2: 26 mmol/L (ref 22–32)
Calcium: 9.2 mg/dL (ref 8.9–10.3)
Chloride: 100 mmol/L (ref 98–111)
Creatinine, Ser: 1.55 mg/dL — ABNORMAL HIGH (ref 0.44–1.00)
GFR, Estimated: 31 mL/min — ABNORMAL LOW (ref >60)
Glucose, Bld: 149 mg/dL — ABNORMAL HIGH (ref 70–99)
Potassium: 3.4 mmol/L — ABNORMAL LOW (ref 3.5–5.1)
Sodium: 136 mmol/L (ref 135–145)

## 2022-11-05 LAB — MAGNESIUM: Magnesium: 1.7 mg/dL (ref 1.7–2.4)

## 2022-11-05 MED ORDER — POTASSIUM CHLORIDE CRYS ER 20 MEQ PO TBCR
40.0000 meq | EXTENDED_RELEASE_TABLET | Freq: Once | ORAL | Status: AC
  Administered 2022-11-05: 40 meq via ORAL
  Filled 2022-11-05: qty 2

## 2022-11-05 NOTE — Evaluation (Signed)
Occupational Therapy Evaluation and DC Summary Patient Details Name: [name] MRN: [id number] DOB: 09-Aug-1927 Today's Date: 11/05/2022   History of Present Illness Pt is a 89 year old female admitted on 11/03/22 for SOB. Past medical history significant of Hx of colon cancer s/p resection, HFpEF, Chronic hypoxic respiratory failure on 2L, CAD s/p PCI, HTN, HLD, a.fib on Eliquis.   Clinical Impression   Pt admitted for above dx, PTA patient lived alone and ambulated in home with RW, completed bADLs with Mod I and managing medications independently, family assists with some iADLs. Pt currently ambulating in the room with supervision and completes bADLs standing at sink with supervision as well. Pt has no signs/reports of SOB, is also very adamant about going home. Pt has no further acute skilled OT needs and would benefit from continued mobility with mobility specialists while in acute setting. No follow-up OT recommended at this time.       Recommendations for follow up therapy are one component of a multi-disciplinary discharge planning process, led by the attending physician.  Recommendations may be updated based on patient status, additional functional criteria and insurance authorization.   Assistance Recommended at Discharge PRN  Patient can return home with the following Assistance with cooking/housework;Assist for transportation    Functional Status Assessment  Patient has not had a recent decline in their functional status  Equipment Recommendations  None recommended by OT (Pt has rec DME)    Recommendations for Other Services       Precautions / Restrictions Precautions Precautions: Fall Restrictions Weight Bearing Restrictions: No      Mobility Bed Mobility               General bed mobility comments: pt rec'd standing in room after leaving BSC, and left sitting EOB. Per PT patient is mod I    Transfers Overall transfer level: Needs assistance Equipment  used: Rolling walker (2 wheels) Transfers: Sit to/from Stand Sit to Stand: Supervision                  Balance Overall balance assessment: No apparent balance deficits (not formally assessed)                                         ADL either performed or assessed with clinical judgement   ADL Overall ADL's : Needs assistance/impaired Eating/Feeding: Independent;Sitting   Grooming: Standing;Oral care;Supervision/safety;Brushing hair   Upper Body Bathing: Sitting;Supervision/ safety   Lower Body Bathing: Sitting/lateral leans;Supervison/ safety   Upper Body Dressing : Standing;Min guard Upper Body Dressing Details (indicate cue type and reason): pt donned gown in jacket like fashion Lower Body Dressing: Sitting/lateral leans;Min guard Lower Body Dressing Details (indicate cue type and reason): uses AE at baseline Toilet Transfer: Supervision/safety;Rolling walker (2 wheels)   Toileting- Clothing Manipulation and Hygiene: Supervision/safety;Sitting/lateral lean   Tub/ Shower Transfer: Ambulation;Supervision/safety;Rolling walker (2 wheels)   Functional mobility during ADLs: Rolling walker (2 wheels);Supervision/safety       Vision         Perception     Praxis      Pertinent Vitals/Pain Pain Assessment Pain Assessment: No/denies pain     Hand Dominance Right   Extremity/Trunk Assessment Upper Extremity Assessment Upper Extremity Assessment: Overall WFL for tasks assessed   Lower Extremity Assessment Lower Extremity Assessment: Overall WFL for tasks assessed   Cervical / Trunk Assessment Cervical /  Trunk Assessment: Normal   Communication Communication Communication: HOH   Cognition Arousal/Alertness: Awake/alert Behavior During Therapy: WFL for tasks assessed/performed Overall Cognitive Status: Difficult to assess                                       General Comments  VSS on 2L    Exercises     Shoulder  Instructions      Home Living Family/patient expects to be discharged to:: Private residence Living Arrangements: Alone Available Help at Discharge: Family;Available PRN/intermittently Type of Home: House Home Access: Level entry     Home Layout: One level     Bathroom Shower/Tub: Producer, television/film/video: Handicapped height Bathroom Accessibility: Yes   Home Equipment: Agricultural consultant (2 wheels);Rollator (4 wheels);Toilet riser;BSC/3in1;Shower seat;Grab bars - tub/shower;Adaptive equipment Adaptive Equipment: Ship broker Additional Comments: Pt uses sockaid for LBD at baseline      Prior Functioning/Environment Prior Level of Function : Needs assist             Mobility Comments: States that she uses rollator around the house ADLs Comments: pt completes bADLs at Mod I level using AE prn        OT Problem List: Cardiopulmonary status limiting activity      OT Treatment/Interventions:      OT Goals(Current goals can be found in the care plan section) Acute Rehab OT Goals Patient Stated Goal: To go home OT Goal Formulation: With patient Time For Goal Achievement: 11/19/22 Potential to Achieve Goals: Good  OT Frequency:      Co-evaluation              AM-PAC OT "6 Clicks" Daily Activity     Outcome Measure Help from another person eating meals?: None Help from another person taking care of personal grooming?: None Help from another person toileting, which includes using toliet, bedpan, or urinal?: A Little Help from another person bathing (including washing, rinsing, drying)?: A Little Help from another person to put on and taking off regular upper body clothing?: A Little Help from another person to put on and taking off regular lower body clothing?: A Little 6 Click Score: 20   End of Session Equipment Utilized During Treatment: Gait belt;Rolling walker (2 wheels) Nurse Communication: Mobility status  Activity Tolerance: Patient  tolerated treatment well Patient left: in bed;with call bell/phone within reach  OT Visit Diagnosis: Unsteadiness on feet (R26.81)                Time: 1610-9604 OT Time Calculation (min): 15 min Charges:  OT General Charges $OT Visit: 1 Visit OT Evaluation $OT Eval Low Complexity: 1 Low  11/05/2022  AB, OTR/L  Acute Rehabilitation Services  Office: [phone]   Tristan Schroeder 11/05/2022, 12:58 PM

## 2022-11-05 NOTE — Progress Notes (Signed)
Progress Note   Patient: [name] QVZ:[id number] DOB: October 08, 1927 DOA: 11/03/2022     2 DOS: the patient was seen and examined on 11/05/2022   Brief hospital course: [name] was admitted to the hospital with the working diagnosis of heart failure decompensation.   89 yo female with the past medical history of colon cancer sp resection, heart failure, hypertension, coronary artery disease, atrial fibrillation, dyslipidemia and chronic hypoxemic respiratory failure who presented with dyspnea. Patient with very poor physical functional status, reported several days of worsening dyspnea, and more fatigued, that prompted her visit to the ED. She was recently started on amiodarone.  On her initial physical examination her blood pressure was 137/57, HR 70, RR 16 and 02 saturation 100%, lungs with no rales or wheezing, heart with S1 and S2 present, regular rhythm, abdomen with no distention and no lower extremity edema.   Na 139, K 4,4 CL 102 bicarbonate 22, glucose 145, bun 27 cr 1,52  BNP 567 High sensitive troponin 29 and 27  Wbc 8,7 hgb 9,1 plt 236    Chest radiograph with left rotation, cardiomegaly with mild hilar vascular congestion. No effusions or infiltrates.   EKG 59 bpm, normal axis, qtc less than 500, sinus rhythm with poor R R wave progression, no significant ST segment or T wave changes.   Patient was placed on IV furosemide for diuresis.   Assessment and Plan: * Acute on chronic diastolic CHF (congestive heart failure) (HCC) Echocardiogram with preserved LV systolic function with EF 60 to 65%, borderline LVH, RV systolic function preserved, atriums with normal size, moderate mitral regurgitation.   Urine output is 1,700 cc Systolic blood pressure is 130 to 149 mmHg.   Volume status has improved, plan to hold on furosemide for today.  Continue with metoprolol and diltiazem.   Paroxysmal A-fib (HCC) I suspect patient is having symptomatic bradycardia. Heart rate has  improved up to 70 and 80.   Continue to hold on amiodarone, but continue with metoprolol and diltiazem. Continue anticoagulation with apixaban. Continue telemetry monitoring.  Follow up with a fib clinic as outpatient, to consider home cardiac monitor.   AKI (acute kidney injury) (HCC) Hypokalemia.   Renal function with serum cr up to 1,55 with K at 3,4 and serum bicarbonate at 26. Na 136   Continue K correction with Kcl Follow up renal function in am. Holding loop diuretic today.   Essential hypertension Continue with metoprolol and diltiazem  CAD S/P PCI: OM2 PTCA 7/02, OM2 Stent 9/02, ISR 11/03 - PTCA; RCA DES 1 /14/14 No chest pain, continue blood pressure control.   Chronic hypoxic respiratory failure (HCC) -stable and remains on home 2L        Subjective: Patient is feeling better, no chest pain or dyspnea, she has been ambulating.   Physical Exam: Vitals:   11/04/22 2130 11/05/22 0640 11/05/22 0713 11/05/22 1109  BP: (!) 126/44 (!) 126/52 (!) 131/55 (!) 149/60  Pulse:  74 75 79  Resp:  19 17 18   Temp:  98.5 F (36.9 C) 98.5 F (36.9 C) 97.7 F (36.5 C)  TempSrc:  Oral Oral Oral  SpO2:  100% 100% 100%  Weight:  63.9 kg    Height:       Neurology awake and alert ENT with mild pallor Cardiovascular with S1 and S2 present and regular with no gallops, rubs or murmurs No JVD No lower extremity edema Respiratory with no rales or wheezing No lower extremity edema  Data  Reviewed:    Family Communication: I spoke with patient's daughter over the phone we talked in detail about patient's condition, plan of care and prognosis and all questions were addressed.   Disposition: Status is: Inpatient Remains inpatient appropriate because: telemetry monitoring, follow up renal function   Planned Discharge Destination: Home      Author: Coralie Keens, MD 11/05/2022 12:54 PM  For on call review [email or web address].

## 2022-11-05 NOTE — Evaluation (Signed)
Physical Therapy Evaluation Patient Details Name: [name] MRN: [id number] DOB: 05-03-1928 Today's Date: 11/05/2022  History of Present Illness  Pt is a 89 year old female admitted on 11/03/22 for SOB. Past medical history significant of Hx of colon cancer s/p resection, HFpEF, Chronic hypoxic respiratory failure on 2L, CAD s/p PCI, HTN, HLD, a.fib on Eliquis.  Clinical Impression  Pt presents with admitting diagnosis above. Pt is very HOH and subjective history used from previous admission. Today, pt was able to ambulate in hallway with RW at supervision level. Pt presents at or near baseline mobility. Pt has no further acute PT needs and will be signing off. Re consult PT if mobility status changes.       Recommendations for follow up therapy are one component of a multi-disciplinary discharge planning process, led by the attending physician.  Recommendations may be updated based on patient status, additional functional criteria and insurance authorization.  Follow Up Recommendations       Assistance Recommended at Discharge Intermittent Supervision/Assistance  Patient can return home with the following  A little help with walking and/or transfers;A little help with bathing/dressing/bathroom;Direct supervision/assist for medications management;Assist for transportation;Help with stairs or ramp for entrance    Equipment Recommendations None recommended by PT (Pt has needed DME)  Recommendations for Other Services       Functional Status Assessment Patient has had a recent decline in their functional status and demonstrates the ability to make significant improvements in function in a reasonable and predictable amount of time.     Precautions / Restrictions Precautions Precautions: Fall Restrictions Weight Bearing Restrictions: No      Mobility  Bed Mobility Overal bed mobility: Modified Independent                  Transfers Overall transfer level: Needs  assistance Equipment used: Rolling walker (2 wheels) Transfers: Sit to/from Stand Sit to Stand: Supervision           General transfer comment: Per NT pt has been transferring to [facility] on her own    Ambulation/Gait Ambulation/Gait assistance: Supervision Gait Distance (Feet): 75 Feet Assistive device: Rolling walker (2 wheels) Gait Pattern/deviations: Decreased stride length, Step-through pattern Gait velocity: decreased     General Gait Details: no LOB noted  Stairs            Wheelchair Mobility    Modified Rankin (Stroke Patients Only)       Balance Overall balance assessment: No apparent balance deficits (not formally assessed)                                           Pertinent Vitals/Pain Pain Assessment Pain Assessment: No/denies pain    Home Living Family/patient expects to be discharged to:: Private residence Living Arrangements: Alone Available Help at Discharge: Family;Available PRN/intermittently Type of Home: House Home Access: Level entry       Home Layout: One level Home Equipment: Agricultural consultant (2 wheels);Rollator (4 wheels);Toilet riser;BSC/3in1;Shower seat;Grab bars - tub/shower Additional Comments: Pulled from previous admission. Pt very HOH and slightly agitated.    Prior Function Prior Level of Function : Needs assist             Mobility Comments: States that she uses rollator around the house ADLs Comments: cooks Healthy choice or soup/salads, pt family brings meals intermittently, does laundry. MOD I for ADLs (Pulled  from previous admission.)     Hand Dominance   Dominant Hand: Right    Extremity/Trunk Assessment   Upper Extremity Assessment Upper Extremity Assessment: Overall WFL for tasks assessed    Lower Extremity Assessment Lower Extremity Assessment: Overall WFL for tasks assessed    Cervical / Trunk Assessment Cervical / Trunk Assessment: Normal  Communication   Communication: HOH   Cognition Arousal/Alertness: Awake/alert Behavior During Therapy: Flat affect, Agitated Overall Cognitive Status: Difficult to assess                                          General Comments General comments (skin integrity, edema, etc.): Pt received on 2L at 95%. Pt ambulated on RA satting between 90-92% however dropped to 85% once seated. Pt was able to recover to 94% on 2L.    Exercises     Assessment/Plan    PT Assessment Patient does not need any further PT services  PT Problem List         PT Treatment Interventions      PT Goals (Current goals can be found in the Care Plan section)       Frequency       Co-evaluation               AM-PAC PT "6 Clicks" Mobility  Outcome Measure Help needed turning from your back to your side while in a flat bed without using bedrails?: None Help needed moving from lying on your back to sitting on the side of a flat bed without using bedrails?: None Help needed moving to and from a bed to a chair (including a wheelchair)?: None Help needed standing up from a chair using your arms (e.g., wheelchair or bedside chair)?: A Little Help needed to walk in hospital room?: A Little Help needed climbing 3-5 steps with a railing? : A Little 6 Click Score: 21    End of Session Equipment Utilized During Treatment: Gait belt;Oxygen Activity Tolerance: Patient tolerated treatment well Patient left: in bed;with call bell/phone within reach (Seated EOB. Handoff to OT) Nurse Communication: Mobility status PT Visit Diagnosis: Other abnormalities of gait and mobility (R26.89)    Time: 1610-9604 PT Time Calculation (min) (ACUTE ONLY): 18 min   Charges:   PT Evaluation $PT Eval Moderate Complexity: 1 Mod PT Treatments $Gait Training: 8-22 mins        Shela Nevin, PT, DPT Acute Rehab Services [phone]   [name] 11/05/2022, 11:49 AM

## 2022-11-06 ENCOUNTER — Encounter (HOSPITAL_COMMUNITY): Payer: Medicare Other | Admitting: Internal Medicine

## 2022-11-06 ENCOUNTER — Other Ambulatory Visit (HOSPITAL_COMMUNITY): Payer: Self-pay

## 2022-11-06 DIAGNOSIS — I5033 Acute on chronic diastolic (congestive) heart failure: Secondary | ICD-10-CM | POA: Diagnosis not present

## 2022-11-06 DIAGNOSIS — I10 Essential (primary) hypertension: Secondary | ICD-10-CM | POA: Diagnosis not present

## 2022-11-06 DIAGNOSIS — N179 Acute kidney failure, unspecified: Secondary | ICD-10-CM | POA: Diagnosis not present

## 2022-11-06 DIAGNOSIS — I480 Paroxysmal atrial fibrillation: Secondary | ICD-10-CM | POA: Diagnosis not present

## 2022-11-06 LAB — BASIC METABOLIC PANEL
Anion gap: 9 (ref 5–15)
BUN: 32 mg/dL — ABNORMAL HIGH (ref 8–23)
CO2: 26 mmol/L (ref 22–32)
Calcium: 9.3 mg/dL (ref 8.9–10.3)
Chloride: 100 mmol/L (ref 98–111)
Creatinine, Ser: 1.53 mg/dL — ABNORMAL HIGH (ref 0.44–1.00)
GFR, Estimated: 32 mL/min — ABNORMAL LOW (ref >60)
Glucose, Bld: 113 mg/dL — ABNORMAL HIGH (ref 70–99)
Potassium: 3.8 mmol/L (ref 3.5–5.1)
Sodium: 135 mmol/L (ref 135–145)

## 2022-11-06 LAB — MAGNESIUM: Magnesium: 1.7 mg/dL (ref 1.7–2.4)

## 2022-11-06 MED ORDER — APIXABAN 2.5 MG PO TABS
2.5000 mg | ORAL_TABLET | Freq: Two times a day (BID) | ORAL | 0 refills | Status: AC
  Filled 2022-11-06: qty 60, 30d supply, fill #0

## 2022-11-06 MED ORDER — APIXABAN 2.5 MG PO TABS: 2.5000 mg | ORAL_TABLET | Freq: Two times a day (BID) | ORAL | Status: DC

## 2022-11-06 MED ORDER — MAGNESIUM SULFATE 2 GM/50ML IV SOLN
2.0000 g | Freq: Once | INTRAVENOUS | Status: AC
  Administered 2022-11-06: 2 g via INTRAVENOUS
  Filled 2022-11-06: qty 50

## 2022-11-06 MED ORDER — POTASSIUM CHLORIDE CRYS ER 20 MEQ PO TBCR
20.0000 meq | EXTENDED_RELEASE_TABLET | Freq: Once | ORAL | Status: AC
  Administered 2022-11-06: 20 meq via ORAL
  Filled 2022-11-06: qty 1

## 2022-11-06 MED ORDER — SODIUM CHLORIDE 0.9 % IV SOLN: INTRAVENOUS | Status: DC | PRN

## 2022-11-06 NOTE — TOC Transition Note (Signed)
Transition of Care [facility]) - CM/SW Discharge Note   Patient Details  Name: [name] MRN: [id number] Date of Birth: [birth year]  Transition of Care [facility]) CM/SW Contact:  Leone Haven, RN Phone Number: 11/06/2022, 1:14 PM   Clinical Narrative:    NCM spoke with patient at bedside, she states her daughter will transport her home today, she states she does not want a HHRN , per pt/ot eval no f/u needed.      Final next level of care: Home/Self Care Barriers to Discharge: No Barriers Identified   Patient Goals and CMS Choice   Choice offered to / list presented to : NA  Discharge Placement                         Discharge Plan and Services Additional resources added to the After Visit Summary for   In-house Referral: NA Discharge Planning Services: CM Consult Post Acute Care Choice: NA          DME Arranged: N/A DME Agency: NA       HH Arranged: NA          Social Determinants of Health (SDOH) Interventions SDOH Screenings   Food Insecurity: No Food Insecurity (11/03/2022)  Housing: Low Risk  (11/03/2022)  Transportation Needs: No Transportation Needs (11/03/2022)  Utilities: Not At Risk (11/03/2022)  Tobacco Use: Medium Risk (11/03/2022)     Readmission Risk Interventions    11/06/2022    1:10 PM  Readmission Risk Prevention Plan  Transportation Screening Complete  PCP or Specialist Appt within 3-5 Days Complete  HRI or Home Care Consult Complete  Palliative Care Screening Not Applicable  Medication Review (RN Care Manager) Complete

## 2022-11-06 NOTE — Progress Notes (Signed)
Discharge instructions provided to patient and daughter. Patient and daughter verbalize understanding of all instructions and follow=up care.  Patient discharged to home via wheelchair by NT at 1623 on 11/06/22.

## 2022-11-06 NOTE — TOC Initial Note (Signed)
Transition of Care [facility]) - Initial/Assessment Note    Patient Details  Name: [name] MRN: [id number] Date of Birth: [birth year]  Transition of Care [facility]) CM/SW Contact:    Leone Haven, RN Phone Number: 11/06/2022, 1:14 PM  Clinical Narrative:                 NCM spoke with patient at bedside, she states her daughter will transport her home today, she states she does not want a HHRN , per pt/ot eval no f/u needed.    Expected Discharge Plan: Home/Self Care Barriers to Discharge: No Barriers Identified   Patient Goals and CMS Choice Patient states their goals for this hospitalization and ongoing recovery are:: return home   Choice offered to / list presented to : NA      Expected Discharge Plan and Services In-house Referral: NA Discharge Planning Services: CM Consult Post Acute Care Choice: NA   Expected Discharge Date: 11/06/22               DME Arranged: N/A DME Agency: NA       HH Arranged: NA          Prior Living Arrangements/Services   Lives with:: Self Patient language and need for interpreter reviewed:: Yes Do you feel safe going back to the place where you live?: Yes      Need for Family Participation in Patient Care: Yes (Comment) Care giver support system in place?: Yes (comment) Current home services: DME (walker) Criminal Activity/Legal Involvement Pertinent to Current Situation/Hospitalization: No - Comment as needed  Activities of Daily Living Home Assistive Devices/Equipment: Hearing aid, Walker (specify type), Cane (specify quad or straight) ADL Screening (condition at time of admission) Patient's cognitive ability adequate to safely complete daily activities?: Yes Is the patient deaf or have difficulty hearing?: Yes Does the patient have difficulty seeing, even when wearing glasses/contacts?: No Does the patient have difficulty concentrating, remembering, or making decisions?: No Patient able to express need for assistance  with ADLs?: Yes Does the patient have difficulty dressing or bathing?: No Independently performs ADLs?: No Communication: Independent Dressing (OT): Independent Grooming: Independent Feeding: Independent Bathing: Independent Toileting: Independent In/Out Bed: Independent with device (comment) (walker or cane) Is this a change from baseline?: Pre-admission baseline Walks in Home: Independent with device (comment) (walker or cane) Is this a change from baseline?: Pre-admission baseline Does the patient have difficulty walking or climbing stairs?: Yes Weakness of Legs: Both Weakness of Arms/Hands: None  Permission Sought/Granted                  Emotional Assessment Appearance:: Appears stated age Attitude/Demeanor/Rapport: Engaged Affect (typically observed): Appropriate Orientation: : Oriented to Self, Oriented to Place, Oriented to  Time, Oriented to Situation Alcohol / Substance Use: Not Applicable Psych Involvement: No (comment)  Admission diagnosis:  Acute on chronic diastolic heart failure (HCC) [I50.33] Dyspnea, unspecified type [R06.00] Patient Active Problem List   Diagnosis Date Noted   Paroxysmal A-fib (HCC) 11/03/2022   Acute on chronic diastolic heart failure (HCC) 11/03/2022   Chronic hypoxic respiratory failure (HCC) 11/03/2022   Hypercoagulable state due to paroxysmal atrial fibrillation (HCC) 10/22/2022   Acute respiratory failure with hypoxia (HCC) 08/06/2022   Acute on chronic diastolic CHF (congestive heart failure) (HCC) 08/06/2022   Asthma, chronic, unspecified asthma severity, with acute exacerbation 08/06/2022   Atrial fibrillation with rapid ventricular response (HCC) 07/27/2022   Leukocytosis 09/15/2016   Change in bowel habits    History  of colon cancer    Atypical chest pain 09/19/2014   Obesity (BMI 30-39.9) 03/14/2014   Medication intolerance - Plavix, Effient, Brilinta    Hyperlipidemia with target LDL less than 70    Essential  hypertension    Diverticulosis of colon without hemorrhage 03/16/2013   Incisional hernia, without obstruction or gangrene 11/28/2012   Asthma 10/22/2012   AKI (acute kidney injury) (HCC) 10/22/2012   Carcinoma of ascending colon (HCC) 11/16/2011   PERSONAL HX COLONIC POLYPS 11/29/2009   S/P NSTEMI -- involving left circumflex coronary artery (OM2) In-stent Thrombosis 03/02/2001   CAD S/P PCI: OM2 PTCA 7/02, OM2 Stent 9/02, ISR 11/03 - PTCA; RCA DES 1 /14/14 12/31/2000   PCP:  Thana Ates, MD Pharmacy:   [facility] [phone] [place], [place] - [address] AT [facility] [address] [facility] [address] [place] [address] Phone: [phone] Fax: [phone]  [facility] [address] [place] [address] Phone: [phone] Fax: [phone]     Social Determinants of Health (SDOH) Social History: SDOH Screenings   Food Insecurity: No Food Insecurity (11/03/2022)  Housing: Low Risk  (11/03/2022)  Transportation Needs: No Transportation Needs (11/03/2022)  Utilities: Not At Risk (11/03/2022)  Tobacco Use: Medium Risk (11/03/2022)   SDOH Interventions:     Readmission Risk Interventions    11/06/2022    1:10 PM  Readmission Risk Prevention Plan  Transportation Screening Complete  PCP or Specialist Appt within 3-5 Days Complete  HRI or Home Care Consult Complete  Palliative Care Screening Not Applicable  Medication Review (RN Care Manager) Complete

## 2022-11-06 NOTE — Discharge Summary (Signed)
Physician Discharge Summary   Patient: [name] MRN: [id number] DOB: 1928-05-10  Admit date:     11/03/2022  Discharge date: 11/06/22  Discharge Physician: York Ram Ender Rorke   PCP: Thana Ates, MD   Recommendations at discharge:    Amiodarone has been discontinued due to bradycardia. Follow up with atrial fibrillation clinic next week, she may need a home cardiac monitor to assess atrial fibrillation burden. Reduced apixaban to 2,5 mg po bid due to age and decreased GFR. Follow up renal function in 7 to 10 days. Continue with furosemide, but hold on further K supplementation due to low GFR.  Follow up with Dr Margaretann Loveless in 7 to 10 days Follow up with Cardiology as scheduled.   I spoke with patient's daughter at the bedside, we talked in detail about patient's condition, plan of care and prognosis and all questions were addressed.   Discharge Diagnoses: Principal Problem:   Acute on chronic diastolic CHF (congestive heart failure) (HCC) Active Problems:   Paroxysmal A-fib (HCC)   AKI (acute kidney injury) (HCC)   Essential hypertension   CAD S/P PCI: OM2 PTCA 7/02, OM2 Stent 9/02, ISR 11/03 - PTCA; RCA DES 1 /14/14   Chronic hypoxic respiratory failure (HCC)  Resolved Problems:   * No resolved hospital problems. [facility] Course: [name] was admitted to the hospital with the working diagnosis of heart failure decompensation.   89 yo female with the past medical history of colon cancer sp resection, heart failure, hypertension, coronary artery disease, atrial fibrillation, dyslipidemia and chronic hypoxemic respiratory failure who presented with dyspnea. Patient with very poor physical functional status, reported several days of worsening dyspnea, and more fatigued, that prompted her visit to the ED. She was recently started on amiodarone.  On her initial physical examination her blood pressure was 137/57, HR 70, RR 16 and 02 saturation 100%, lungs with no rales or  wheezing, heart with S1 and S2 present, regular rhythm, abdomen with no distention and no lower extremity edema.   Na 139, K 4,4 CL 102 bicarbonate 22, glucose 145, bun 27 cr 1,52  BNP 567 High sensitive troponin 29 and 27  Wbc 8,7 hgb 9,1 plt 236    Chest radiograph with left rotation, cardiomegaly with mild hilar vascular congestion. No effusions or infiltrates.   EKG 59 bpm, normal axis, qtc less than 500, sinus rhythm with poor R R wave progression, no significant ST segment or T wave changes.   Patient was placed on IV furosemide for diuresis.  Amiodarone was held due to bradycardia.   05/07 volume status has improved, patient will continue to hold on amiodarone and will follow up with atrial fibrillation clinic.   Assessment and Plan: * Acute on chronic diastolic CHF (congestive heart failure) (HCC) Echocardiogram with preserved LV systolic function with EF 60 to 65%, borderline LVH, RV systolic function preserved, atriums with normal size, moderate mitral regurgitation.   Patient was placed IV furosemide for diuresis, negative fluid balance was achieved, patient lost 2 Kg.  Systolic blood pressure is 120 to 140 mmHg.   Continue with metoprolol and diltiazem.  Diuresis with furosemide. Holding RAAS inhibition due to reduced GFR.   Paroxysmal A-fib (HCC) I suspect patient is having symptomatic bradycardia. Heart rate has improved up to 70 and 80.   Continue to hold on amiodarone, but continue with metoprolol and diltiazem. Continue anticoagulation with apixaban (reduced dose for age and GFR).  I have made an appointment with the a  fib clinic for next week. She may need a home cardiac monitor to assess a fib burden, for now will continue to hold on amiodarone to avoid bradycardia.   AKI (acute kidney injury) (HCC) Hypokalemia.   A the time of her discharge her serum cr is 1,53 with K at 3,8 and serum bicarbonate at 26, Na 135 and Mg 1,7   Patient will get 20 meq Kcl and  2 g Mag sulfate today prior to her discharge.  Follow up renal function as outpatient.   Essential hypertension Continue with metoprolol and diltiazem  CAD S/P PCI: OM2 PTCA 7/02, OM2 Stent 9/02, ISR 11/03 - PTCA; RCA DES 1 /14/14 No chest pain, continue blood pressure control.   Chronic hypoxic respiratory failure (HCC) -stable and remains on home 2L         Consultants: none  Procedures performed: none   Disposition: Home Diet recommendation:  Cardiac diet DISCHARGE MEDICATION: Allergies as of 11/06/2022       Reactions   Brilinta [ticagrelor] Shortness Of Breath   Bleeding    Lipitor [atorvastatin] Shortness Of Breath, Other (See Comments)   Myalgias    Plavix [clopidogrel] Shortness Of Breath, Other (See Comments)   Bleeding    Codeine Nausea And Vomiting   Mobic [meloxicam] Other (See Comments)   Stomach upset   Paxil [paroxetine] Other (See Comments)   Stomach upset Myalgias  Chills    Sulfa Antibiotics Diarrhea, Other (See Comments)   Stomach upset   Sulfonamide Derivatives Diarrhea, Other (See Comments)   Stomach upset        Medication List     STOP taking these medications    amiodarone 200 MG tablet Commonly known as: PACERONE   potassium chloride 10 MEQ tablet Commonly known as: KLOR-CON M   potassium chloride 10 MEQ tablet Commonly known as: KLOR-CON       TAKE these medications    acetaminophen 500 MG tablet Commonly known as: TYLENOL Take 500 mg by mouth in the morning and at bedtime.   albuterol 108 (90 Base) MCG/ACT inhaler Commonly known as: VENTOLIN HFA Inhale 2 puffs into the lungs every 6 (six) hours as needed for wheezing.   apixaban 2.5 MG Tabs tablet Commonly known as: ELIQUIS Take 1 tablet (2.5 mg total) by mouth 2 (two) times daily. What changed:  medication strength how much to take   Cartia XT 300 MG 24 hr capsule Generic drug: diltiazem Take 1 capsule (300 mg total) by mouth daily.   cetirizine 10 MG  tablet Commonly known as: ZYRTEC Take 10 mg by mouth at bedtime.   escitalopram 5 MG tablet Commonly known as: LEXAPRO Take 5 mg by mouth daily.   fluticasone 50 MCG/ACT nasal spray Commonly known as: FLONASE Place 1 spray into the nose daily as needed for allergies.   furosemide 40 MG tablet Commonly known as: LASIX Take 1 tablet (40 mg total) by mouth daily.   Melatonin 5 MG Chew Chew 5 mg by mouth at bedtime as needed (sleep).   metoprolol tartrate 25 MG tablet Commonly known as: LOPRESSOR Take 25 mg by mouth 2 (two) times daily.   multivitamin tablet Take 1 tablet by mouth daily.   nitroGLYCERIN 0.4 MG SL tablet Commonly known as: NITROSTAT Place 1 tablet (0.4 mg total) under the tongue every 5 (five) minutes as needed for chest pain.        Discharge Exam: Filed Weights   11/04/22 0550 11/05/22 0640 11/06/22 0503  Weight:  64.1 kg 63.9 kg 62.1 kg   BP (!) 144/74   Pulse 85   Temp (!) 97.5 F (36.4 C) (Oral)   Resp 18   Ht 4\' 11"  (1.499 m)   Wt 62.1 kg   SpO2 95%   BMI 27.65 kg/m   Patient is feeling better, dyspnea has improved, no chest pain, she has been out of bed and ambulating  Neurology awake and alert ENT with mild pallor Cardiovascular with S1 and S2 present and rhythmic with no gallops, rubs or murmurs No JVD No lower extremity edema Respiratory with no rales or wheezing, no rhonchi Abdomen with no distention   Condition at discharge: stable  The results of significant diagnostics from this hospitalization (including imaging, microbiology, ancillary and laboratory) are listed below for reference.   Imaging Studies: DG Chest Port 1 View  Result Date: 11/03/2022 CLINICAL DATA:  Shortness of breath EXAM: PORTABLE CHEST 1 VIEW COMPARISON:  08/06/2022 FINDINGS: Stable cardiomegaly. Aortic atherosclerotic calcification. Mild pulmonary vascular congestion bibasilar atelectasis or scarring. No definite pleural effusion. No pneumothorax. No  displaced rib fractures. Advanced arthritis both shoulders. IMPRESSION: Cardiomegaly with mild pulmonary vascular congestion and bibasilar atelectasis or scarring. Electronically Signed   By: Minerva Fester M.D.   On: 11/03/2022 17:49    Microbiology: Results for orders placed or performed during the hospital encounter of 08/06/22  Resp panel by RT-PCR (RSV, Flu A&B, Covid) Anterior Nasal Swab     Status: None   Collection Time: 08/06/22  9:46 AM   Specimen: Anterior Nasal Swab  Result Value Ref Range Status   SARS Coronavirus 2 by RT PCR NEGATIVE NEGATIVE Final   Influenza A by PCR NEGATIVE NEGATIVE Final   Influenza B by PCR NEGATIVE NEGATIVE Final    Comment: (NOTE) The Xpert Xpress SARS-CoV-2/FLU/RSV plus assay is intended as an aid in the diagnosis of influenza from Nasopharyngeal swab specimens and should not be used as a sole basis for treatment. Nasal washings and aspirates are unacceptable for Xpert Xpress SARS-CoV-2/FLU/RSV testing.  Fact Sheet for Patients: BloggerCourse.com  Fact Sheet for Healthcare Providers: SeriousBroker.it  This test is not yet approved or cleared by the [place] FDA and has been authorized for detection and/or diagnosis of SARS-CoV-2 by FDA under an Emergency Use Authorization (EUA). This EUA will remain in effect (meaning this test can be used) for the duration of the COVID-19 declaration under Section 564(b)(1) of the Act, 21 U.S.C. section 360bbb-3(b)(1), unless the authorization is terminated or revoked.     Resp Syncytial Virus by PCR NEGATIVE NEGATIVE Final    Comment: (NOTE) Fact Sheet for Patients: BloggerCourse.com  Fact Sheet for Healthcare Providers: SeriousBroker.it  This test is not yet approved or cleared by the [place] FDA and has been authorized for detection and/or diagnosis of SARS-CoV-2 by FDA under an Emergency  Use Authorization (EUA). This EUA will remain in effect (meaning this test can be used) for the duration of the COVID-19 declaration under Section 564(b)(1) of the Act, 21 U.S.C. section 360bbb-3(b)(1), unless the authorization is terminated or revoked.  Performed at [facility], [address], [place], [place] [address]   Respiratory (~20 pathogens) panel by PCR     Status: None   Collection Time: 08/06/22  9:58 AM   Specimen: Nasopharyngeal Swab; Respiratory  Result Value Ref Range Status   Adenovirus NOT DETECTED NOT DETECTED Final   Coronavirus 229E NOT DETECTED NOT DETECTED Final    Comment: (NOTE) The Coronavirus on  the Respiratory Panel, DOES NOT test for the novel  Coronavirus (2019 nCoV)    Coronavirus HKU1 NOT DETECTED NOT DETECTED Final   Coronavirus NL63 NOT DETECTED NOT DETECTED Final   Coronavirus OC43 NOT DETECTED NOT DETECTED Final   Metapneumovirus NOT DETECTED NOT DETECTED Final   Rhinovirus / Enterovirus NOT DETECTED NOT DETECTED Final   Influenza A NOT DETECTED NOT DETECTED Final   Influenza B NOT DETECTED NOT DETECTED Final   Parainfluenza Virus 1 NOT DETECTED NOT DETECTED Final   Parainfluenza Virus 2 NOT DETECTED NOT DETECTED Final   Parainfluenza Virus 3 NOT DETECTED NOT DETECTED Final   Parainfluenza Virus 4 NOT DETECTED NOT DETECTED Final   Respiratory Syncytial Virus NOT DETECTED NOT DETECTED Final   Bordetella pertussis NOT DETECTED NOT DETECTED Final   Bordetella Parapertussis NOT DETECTED NOT DETECTED Final   Chlamydophila pneumoniae NOT DETECTED NOT DETECTED Final   Mycoplasma pneumoniae NOT DETECTED NOT DETECTED Final    Comment: Performed at [facility], [address] [place], [place] [address]    Labs: CBC: Recent Labs  Lab 11/03/22 1728  WBC 8.7  NEUTROABS 7.0  HGB 9.1*  HCT 29.4*  MCV 94.8  PLT 236   Basic Metabolic Panel: Recent Labs  Lab 11/03/22 1728 11/04/22 0031 11/05/22 0035 11/06/22 0031  NA 139 138  136 135  K 4.4 3.2* 3.4* 3.8  CL 102 98 100 100  CO2 22 27 26 26   GLUCOSE 145* 164* 149* 113*  BUN 27* 22 27* 32*  CREATININE 1.52* 1.35* 1.55* 1.53*  CALCIUM 9.5 9.5 9.2 9.3  MG  --   --  1.7 1.7   Liver Function Tests: Recent Labs  Lab 11/03/22 1728  AST 25  ALT 11  ALKPHOS 44  BILITOT 0.9  PROT 6.7  ALBUMIN 3.8   CBG: No results for input(s): "GLUCAP" in the last 168 hours.  Discharge time spent: greater than 30 minutes.  Signed: Coralie Keens, MD [facility] 11/06/2022

## 2022-11-06 NOTE — Plan of Care (Signed)

## 2022-11-06 NOTE — Progress Notes (Signed)
  Transition of Care [facility]) Screening Note   Patient Details  Name: [name] Date of Birth: Aug 02, 1927   Transition of Care [facility]) CM/SW Contact:    Leone Haven, RN Phone Number: 11/06/2022, 12:50 PM    Transition of Care Department [facility]) has reviewed patient , from home alone, here with CHF, resp failure, afib, We will continue to monitor patient advancement through interdisciplinary progression rounds. If new patient transition needs arise, please place a TOC consult.

## 2022-11-06 NOTE — Care Management Important Message (Signed)
Important Message  Patient Details  Name: [name] MRN: [id number] Date of Birth: [birth year]   Medicare Important Message Given:  Yes     Renie Ora 11/06/2022, 8:37 AM

## 2022-11-06 NOTE — Progress Notes (Signed)
Heart Failure Navigator Progress Note  Assessed for Heart & Vascular TOC clinic readiness.  Patient does not meet criteria due to EF 60-65%, per MD note will follow up with A-fib clinic .   Navigator will sign off at this time.   Rhae Hammock, BSN, Scientist, clinical (histocompatibility and immunogenetics) Only

## 2022-11-06 NOTE — Progress Notes (Signed)
Dr. Ella Jubilee notified that IV Magnesium stopped due to patient complaint of burning at site and unable to tolerate. MD states that patient may be discharged to home without completing dose.

## 2022-11-06 NOTE — Progress Notes (Signed)
Mobility Specialist Progress Note:   11/06/22 0929  Mobility  Activity Transferred to/from [facility]  Level of Assistance Contact guard assist, steadying assist  Assistive Device Other (Comment);BSC (HHA)  Distance Ambulated (ft) 5 ft  Activity Response Tolerated well  Mobility Referral Yes  $Mobility charge 1 Mobility  Mobility Specialist Start Time (ACUTE ONLY) [id number]  Mobility Specialist Stop Time (ACUTE ONLY) 0930  Mobility Specialist Time Calculation (min) (ACUTE ONLY) 8 min   Pt asleep upon arrival, difficult to arouse. Once awake, pt stated she needed to use the [facility]. Pt politely declined ambulation d/t fatigue. States she didn't sleep much last night. Pt back in bed with all needs met.  Addison Lank Mobility Specialist Please contact via SecureChat or  Rehab office at [phone]

## 2022-11-12 ENCOUNTER — Ambulatory Visit (HOSPITAL_COMMUNITY)
Admission: RE | Admit: 2022-11-12 | Discharge: 2022-11-12 | Disposition: A | Discharge status: Home / Self Care | Payer: Medicare Other | Source: Ambulatory Visit | Attending: Internal Medicine | Admitting: Internal Medicine

## 2022-11-12 ENCOUNTER — Inpatient Hospital Stay (HOSPITAL_COMMUNITY)
Admission: RE | Admit: 2022-11-12 | Discharge: 2022-11-12 | Disposition: A | Discharge status: Home / Self Care | Payer: Medicare Other | Source: Ambulatory Visit | Attending: Internal Medicine | Admitting: Internal Medicine

## 2022-11-12 VITALS — BP 142/50 | HR 72 | Ht 59.0 in | Wt 142.8 lb

## 2022-11-12 DIAGNOSIS — I480 Paroxysmal atrial fibrillation: Secondary | ICD-10-CM

## 2022-11-12 DIAGNOSIS — E785 Hyperlipidemia, unspecified: Secondary | ICD-10-CM | POA: Diagnosis not present

## 2022-11-12 DIAGNOSIS — I2510 Atherosclerotic heart disease of native coronary artery without angina pectoris: Secondary | ICD-10-CM | POA: Insufficient documentation

## 2022-11-12 DIAGNOSIS — Z85038 Personal history of other malignant neoplasm of large intestine: Secondary | ICD-10-CM | POA: Insufficient documentation

## 2022-11-12 DIAGNOSIS — Z87891 Personal history of nicotine dependence: Secondary | ICD-10-CM | POA: Insufficient documentation

## 2022-11-12 DIAGNOSIS — Z8249 Family history of ischemic heart disease and other diseases of the circulatory system: Secondary | ICD-10-CM | POA: Diagnosis not present

## 2022-11-12 DIAGNOSIS — I10 Essential (primary) hypertension: Secondary | ICD-10-CM | POA: Diagnosis not present

## 2022-11-12 DIAGNOSIS — D6869 Other thrombophilia: Secondary | ICD-10-CM | POA: Diagnosis not present

## 2022-11-12 DIAGNOSIS — Z7901 Long term (current) use of anticoagulants: Secondary | ICD-10-CM | POA: Insufficient documentation

## 2022-11-12 NOTE — Progress Notes (Signed)
Primary Care Physician: Thana Ates, MD Primary Cardiologist: Dr. Herbie Baltimore Primary Electrophysiologist: None Referring Physician: Carlos Levering, NP   [name] is a 89 y.o. female with a history of CAD s/p DES, HTN, HLD, colon cancer, anemia, and atrial fibrillation who presents for consultation in the [facility]. She was hospitalized due to Afib with RVR 1/26-28/24 and 2/5-7/24. Evaluated by PCP on 10/01/22 and found to be in Afib with RVR (unable to view ECG). Most recently seen by Cardiology on 4/4 and found to be in SR. Patient is on Eliquis 5 mg BID for a CHADS2VASC score of 5.  On evaluation 10/22/22, she is in SR currently. Daughter here with patient and notes that overall she generally does not do a lot of physical activity and this was the case prior to January diagnosis of Afib. She does not have cardiac awareness when she is in Afib. Currently on 2L via [place] but cannula is hanging on her ear. Daughter would like to know possible options for management of Afib considering mother still prefers to live by herself.   She is compliant with anticoagulation and has not missed any doses. She has no bleeding concerns.  On follow up 11/12/22, she is in NSR. She is s/p hospital admission 5/4-5/7 for acute on chronic diastolic CHF. She was placed on amiodarone 10/22/22 for AAD treatment of Afib. Patient stated since starting medication she has not felt good since then. Complained of feeling more short of breath and fatigued. Amiodarone was held in hospital due to symptomatic bradycardia. She was discharged on Lopressor 25 mg BID and diltiazem daily. Her creatinine in hospital was > 1.5 so discharged on Eliquis 2.5 mg BID. Recommended outpatient monitor to determine Afib burden.  Today, she denies symptoms of palpitations, chest pain, shortness of breath, orthopnea, PND, lower extremity edema, dizziness, presyncope, syncope, snoring, daytime somnolence, bleeding, or  neurologic sequela. The patient is tolerating medications without difficulties and is otherwise without complaint today.   Atrial Fibrillation Risk Factors:  She uses oxygen via [place].  she does not have a history of rheumatic fever. she does not have a history of alcohol use. The patient does not have a history of early familial atrial fibrillation or other arrhythmias.  she has a BMI of Body mass index is 28.84 kg/m.[name] Kitchen Filed Weights   11/12/22 1537  Weight: 64.8 kg     Family History  Problem Relation Age of Onset   Pancreatic cancer Father    Prostate cancer Father    Skin cancer Father    Heart disease Mother    Hypertension Mother    Pancreatic cancer Brother    Heart disease Sister    Colon cancer Brother        ? Colostomy   Prostate cancer Brother    Colon cancer Sister    Melanoma Brother      Atrial Fibrillation Management history:  Previous antiarrhythmic drugs: amiodarone Previous cardioversions: None Previous ablations: None Anticoagulation history: Eliquis 2.5 mg BID   Past Medical History:  Diagnosis Date   Adenomatous colon polyp MAY 2013   HOSPITALIZED AT [facility] WITH HGB 7 -TRANSFUSED-MASS FOUND IN ASCENDING COLON   Aortic sclerosis    With murmur   Asthma    CAD S/P percutaneous coronary angioplasty 2002, 2003,2014   a) Unstable Angina 7/'02: 1st Cutting PTCA- OM2 --> restenosed 9/'02 NSTEMI--> 3.0 mm x 12 mm BMS-OM 2; b) 11/'03 DOE w/ + Cardiolite - Cutter  PTCA for ISR -- patent in 2004 (after False + Cardiolite); c) 07/2012 - Unstable Angina -- PCI to proximal RCA with Promus Premier DES 2.75 mm x 20 mm (3.0 mm), patent OM2 stent ~10% ISR (HARDING)   Complication of anesthesia 2009   HIP REPLACEMENT-PT HAD HARD TIME WAKING UP--FELT LIKE SHE COULDN'T BREATHE   Coronary stent restenosis due to scar tissue 05/2002   Cutting Balloon PTCA to OM2 BMS   Diverticulosis 10/2012   Seen on Colonoscopy   DJD (degenerative joint disease), thoracolumbar     Back, Hips & Knees   Essential hypertension    H/O Colon cancer 10/2011   GI - Drs. Leone Payor - colonoscopy 10/2012: diverticulosis, stable ileocolic anastomosis of R colon   Hyperlipidemia with target LDL less than 70    Internal hemorrhoids    Iron deficiency anemia due to chronic blood loss 10/2011   Blood Transfusion (EGD 09/2012 - unremarkable)   Medication intolerance - Plavix, Effient, Brilinta    Easy bruising, GI bleeds (led to Dx of Colon CA), Brilinta - dyspnea   Multiple lung nodules on CT 08/2012   scattered, bilateral but right>left.    Non-Q wave ST elevation myocardial infarction (STEMI) involving left circumflex coronary artery 03/2001   a) Severe thrombic ISR of prior PTCA site in OM2 --> BMS PCI; b) Echo 08/2012: Normal LV size & function.  EF 55-60%, no regional WMA, Gr 1 DD, mild MR, mild-mod TR - NO Pulmonary HTN   Osteoarthritis (arthritis due to wear and tear of joints)    Bilateral Hip Arthroplasty, L Knee TKA   Osteoporosis    Past Surgical History:  Procedure Laterality Date   APPENDECTOMY     CHOLECYSTECTOMY     COLONOSCOPY  11/16/2011   Procedure: COLONOSCOPY;  Surgeon: Iva Boop, MD;  Location: [facility] ENDOSCOPY;  Service: Endoscopy;  Laterality: N/A;   COLONOSCOPY  10/2012   Leone Payor: diverticulosis, stable R colon ileocolic anastomosis   COLONOSCOPY W/ BIOPSIES AND POLYPECTOMY  06/15/2008   adenomatous polyps, diverticulosis, internal hemorrhoids   COLONOSCOPY WITH PROPOFOL N/A 07/18/2015   Procedure: COLONOSCOPY WITH PROPOFOL;  Surgeon: Iva Boop, MD;  Location: [facility] ENDOSCOPY;  Service: Endoscopy;  Laterality: N/A;   CORONARY ANGIOPLASTY WITH STENT PLACEMENT  2002; 2003; 07/2012   a)  7/'02: 1st Cutting PTCA- OM2 --> restenosed 9/'02 --> 3.0 mm x 12 mm BMS-OM 2; b) 11/'03 - Cutter PTCA for ISR -- patent in 2004;    DILATATION & CURETTAGE/HYSTEROSCOPY WITH MYOSURE N/A 03/21/2022   Procedure: DILATATION & CURETTAGE/HYSTEROSCOPY WITH MYOSURE, PAP SMEAR;  Surgeon:  Steva Ready, DO;  Location: [facility] OR;  Service: Gynecology;  Laterality: N/A;   ESOPHAGOGASTRODUODENOSCOPY N/A 10/23/2012   Procedure: ESOPHAGOGASTRODUODENOSCOPY (EGD);  Surgeon: Louis Meckel, MD;  Location: [facility] ENDOSCOPY;  Service: Endoscopy;  Laterality: N/A;   INCISIONAL HERNIA REPAIR N/A 01/29/2013   Procedure: LAPAROSCOPIC INCISIONAL HERNIA;  Surgeon: Shelly Rubenstein, MD;  Location: [facility] ORS;  Service: General;  Laterality: N/A;   INSERTION OF MESH N/A 01/29/2013   Procedure: INSERTION OF MESH;  Surgeon: Shelly Rubenstein, MD;  Location: [facility] ORS;  Service: General;  Laterality: N/A;   LAPAROSCOPY  03/21/2022   Procedure: DIAGNOSTIC LAPAROSCOPY;  Surgeon: Steva Ready, DO;  Location: [facility] OR;  Service: Gynecology;;   LEFT HEART CATHETERIZATION WITH CORONARY ANGIOGRAM N/A 07/15/2012   Procedure: LEFT HEART CATHETERIZATION WITH CORONARY ANGIOGRAM;  Surgeon: Marykay Lex, MD;  Location: [facility] CATH LAB: pRCA 80-90%, OM2 BMS ~10%  ISR   NM MYOVIEW LTD  08/2014    Normal LV size and function. Normal wall motion. LOW RISK.   PERCUTANEOUS CORONARY STENT INTERVENTION (PCI-S)  07/15/2012   Procedure: PERCUTANEOUS CORONARY STENT INTERVENTION (PCI-S);  Surgeon: Marykay Lex, MD;  Location: [facility] CATH LAB: c) PCI to proximal RCA with Promus Premier DES 2.75 mm x 20 mm (3.0 mm)   REPLACEMENT TOTAL KNEE Left 2003   TOTAL HIP ARTHROPLASTY  09/08/2007   left, Dr. Lequita Halt   TOTAL HIP ARTHROPLASTY Right 2003   TRANSTHORACIC ECHOCARDIOGRAM  08/2012   Normal LV size & function.  EF 55-60%, no regional WMA, Gr 1 DD, mild MR, mild-mod TR; aortic sclerosis without stenosis    Current Outpatient Medications  Medication Sig Dispense Refill   acetaminophen (TYLENOL) 500 MG tablet Take 500 mg by mouth in the morning and at bedtime.     albuterol (PROVENTIL HFA;VENTOLIN HFA) 108 (90 BASE) MCG/ACT inhaler Inhale 2 puffs into the lungs every 6 (six) hours as needed for wheezing.     apixaban (ELIQUIS) 2.5 MG TABS  tablet Take 1 tablet (2.5 mg total) by mouth 2 (two) times daily. 60 tablet 0   cetirizine (ZYRTEC) 10 MG tablet Take 10 mg by mouth at bedtime.     diltiazem (CARDIZEM CD) 300 MG 24 hr capsule Take 1 capsule (300 mg total) by mouth daily. 30 capsule 1   escitalopram (LEXAPRO) 5 MG tablet Take 5 mg by mouth daily.     fluticasone (FLONASE) 50 MCG/ACT nasal spray Place 1 spray into the nose daily as needed for allergies.     furosemide (LASIX) 40 MG tablet Take 1 tablet (40 mg total) by mouth daily. 30 tablet 1   Melatonin 5 MG CHEW Chew 5 mg by mouth at bedtime as needed (sleep).     metoprolol tartrate (LOPRESSOR) 25 MG tablet Take 25 mg by mouth 2 (two) times daily.     Multiple Vitamin (MULTIVITAMIN) tablet Take 1 tablet by mouth daily.     nitroGLYCERIN (NITROSTAT) 0.4 MG SL tablet Place 1 tablet (0.4 mg total) under the tongue every 5 (five) minutes as needed for chest pain. 180 tablet 0   No current facility-administered medications for this encounter.    Allergies  Allergen Reactions   Brilinta [Ticagrelor] Shortness Of Breath    Bleeding    Lipitor [Atorvastatin] Shortness Of Breath and Other (See Comments)    Myalgias    Plavix [Clopidogrel] Shortness Of Breath and Other (See Comments)    Bleeding    Codeine Nausea And Vomiting   Mobic [Meloxicam] Other (See Comments)    Stomach upset   Paxil [Paroxetine] Other (See Comments)    Stomach upset Myalgias  Chills    Sulfa Antibiotics Diarrhea and Other (See Comments)    Stomach upset   Sulfonamide Derivatives Diarrhea and Other (See Comments)    Stomach upset    Social History   Socioeconomic History   Marital status: Married    Spouse name: Not on file   Number of children: 2   Years of education: Not on file   Highest education level: Not on file  Occupational History   Occupation: retired  Tobacco Use   Smoking status: Former    Years: 30    Types: Cigarettes    Quit date: 07/02/1982    Years since quitting:  40.3   Smokeless tobacco: Never  Vaping Use   Vaping Use: Never used  Substance and Sexual Activity  Alcohol use: Yes    Alcohol/week: 0.0 standard drinks of alcohol    Comment: occ wine   Drug use: No   Sexual activity: Not on file  Other Topics Concern   Not on file  Social History Narrative   Married, Mother of 2 adopted children. She has 3 grandchildren.   She worked as a Agricultural consultant at [employer].   She quit smoking in 1984, and does not drink alcohol.   Social Determinants of Health   Financial Resource Strain: Not on file  Food Insecurity: No Food Insecurity (11/03/2022)   Hunger Vital Sign    Worried About Running Out of Food in the Last Year: Never true    Ran Out of Food in the Last Year: Never true  Transportation Needs: No Transportation Needs (11/03/2022)   PRAPARE - Administrator, Civil Service (Medical): No    Lack of Transportation (Non-Medical): No  Physical Activity: Not on file  Stress: Not on file  Social Connections: Not on file  Intimate Partner Violence: Not At Risk (11/03/2022)   Humiliation, Afraid, Rape, and Kick questionnaire    Fear of Current or Ex-Partner: No    Emotionally Abused: No    Physically Abused: No    Sexually Abused: No     ROS- All systems are reviewed and negative except as per the HPI above.  Physical Exam: Vitals:   11/12/22 1537  Weight: 64.8 kg  Height: 4\' 11"  (1.499 m)    GEN- The patient is well appearing, alert and oriented x 3 today.   Head- normocephalic, atraumatic Eyes-  Sclera clear, conjunctiva pink Ears- hearing intact Oropharynx- clear Neck- supple, no JVP Lymph- no cervical lymphadenopathy Lungs- Clear to ausculation bilaterally, normal work of breathing Heart- Regular rate and rhythm, no murmurs, rubs or gallops, PMI not laterally displaced GI- soft, NT, ND, + BS Extremities- no clubbing, cyanosis, or edema MS- no significant deformity or atrophy Skin- no rash or lesion Psych-  euthymic mood, full affect Neuro- strength and sensation are intact    Wt Readings from Last 3 Encounters:  11/12/22 64.8 kg  11/06/22 62.1 kg  10/22/22 64.8 kg    EKG today demonstrates  Vent. rate 72 BPM PR interval 162 ms QRS duration 70 ms QT/QTcB 382/418 ms P-R-T axes 89 20 12 Sinus rhythm with Premature supraventricular complexes Low voltage QRS Nonspecific T wave abnormality Abnormal ECG When compared with ECG of 04-Nov-2022 10:58, PREVIOUS ECG IS PRESENT  Echo 07/29/22 demonstrated: 1. Left ventricular ejection fraction, by estimation, is 60 to 65%. The  left ventricle has normal function. The left ventricle has no regional  wall motion abnormalities. Left ventricular diastolic parameters are  consistent with Grade II diastolic  dysfunction (pseudonormalization). Elevated left atrial pressure.   2. Right ventricular systolic function is normal. The right ventricular  size is normal.   3. The mitral valve is normal in structure. Moderate mitral valve  regurgitation. No evidence of mitral stenosis. Moderate mitral annular  calcification.   4. The aortic valve is tricuspid. There is mild thickening of the aortic  valve. Aortic valve regurgitation is not visualized. Aortic valve  sclerosis is present, with no evidence of aortic valve stenosis.   5. The inferior vena cava is normal in size with greater than 50%  respiratory variability, suggesting right atrial pressure of 3 mmHg.   Comparison(s): During the study the rhythm is not atrial fibrillation, but  sinus rhythm with very frequent PACs and frequent  brief episodes of atrial  tachycardia.   Epic records are reviewed at length today.  CHA2DS2-VASc Score = 5  The patient's score is based upon: CHF History: 0 HTN History: 1 Diabetes History: 0 Stroke History: 0 Vascular Disease History: 1 Age Score: 2 Gender Score: 1       ASSESSMENT AND PLAN: Paroxysmal Atrial Fibrillation (ICD10:  I48.0) The  patient's CHA2DS2-VASc score is 5, indicating a 7.2% annual risk of stroke.    She is in NSR.  She had symptomatic bradycardia from amiodarone and this was discontinued. She is not a candidate for flecainide due to history of CAD.   Discussion about medication treatments would include rate control as a primary strategy or Tikosyn load. Her estimated 23 CrCl with creatinine 1.53 on 11/06/22 would put her at lowest dose of 125 mcg BID. Patient still at this time very reluctant to be admitted for Tikosyn load. Daughter hesitant to leave patient with only rate control as a strategy. Given this, we will place cardiac monitor to determine burden and can help plan strategy going forward.   Cardiac monitor x 2 weeks. No medication change at this time.    2. Secondary Hypercoagulable State (ICD10:  D68.69) The patient is at significant risk for stroke/thromboembolism based upon her CHA2DS2-VASc Score of 5.  Continue Apixaban (Eliquis).  No missed doses.      F/u 6 weeks Afib clinic.    Lake Bells, PA-C Afib Clinic [facility] [address] [place], [place] [address] [phone] 11/12/2022 4:02 PM

## 2022-11-22 ENCOUNTER — Observation Stay (HOSPITAL_COMMUNITY)
Admission: EM | Admit: 2022-11-22 | Discharge: 2022-11-24 | Disposition: A | Discharge status: Home / Self Care | Payer: Medicare Other | Attending: Internal Medicine | Admitting: Internal Medicine

## 2022-11-22 ENCOUNTER — Other Ambulatory Visit: Payer: Self-pay

## 2022-11-22 ENCOUNTER — Encounter (HOSPITAL_COMMUNITY): Payer: Self-pay

## 2022-11-22 ENCOUNTER — Emergency Department (HOSPITAL_COMMUNITY): Payer: Medicare Other

## 2022-11-22 DIAGNOSIS — I5033 Acute on chronic diastolic (congestive) heart failure: Secondary | ICD-10-CM | POA: Diagnosis not present

## 2022-11-22 DIAGNOSIS — I110 Hypertensive heart disease with heart failure: Secondary | ICD-10-CM | POA: Diagnosis not present

## 2022-11-22 DIAGNOSIS — Z9861 Coronary angioplasty status: Secondary | ICD-10-CM

## 2022-11-22 DIAGNOSIS — I480 Paroxysmal atrial fibrillation: Secondary | ICD-10-CM | POA: Diagnosis not present

## 2022-11-22 DIAGNOSIS — C182 Malignant neoplasm of ascending colon: Secondary | ICD-10-CM | POA: Diagnosis not present

## 2022-11-22 DIAGNOSIS — I959 Hypotension, unspecified: Secondary | ICD-10-CM | POA: Diagnosis not present

## 2022-11-22 DIAGNOSIS — Z96643 Presence of artificial hip joint, bilateral: Secondary | ICD-10-CM | POA: Diagnosis not present

## 2022-11-22 DIAGNOSIS — I4891 Unspecified atrial fibrillation: Secondary | ICD-10-CM | POA: Diagnosis not present

## 2022-11-22 DIAGNOSIS — E8779 Other fluid overload: Secondary | ICD-10-CM | POA: Diagnosis not present

## 2022-11-22 DIAGNOSIS — J45909 Unspecified asthma, uncomplicated: Secondary | ICD-10-CM | POA: Diagnosis present

## 2022-11-22 DIAGNOSIS — E785 Hyperlipidemia, unspecified: Secondary | ICD-10-CM | POA: Diagnosis present

## 2022-11-22 DIAGNOSIS — E669 Obesity, unspecified: Secondary | ICD-10-CM | POA: Diagnosis present

## 2022-11-22 DIAGNOSIS — Z955 Presence of coronary angioplasty implant and graft: Secondary | ICD-10-CM | POA: Diagnosis not present

## 2022-11-22 DIAGNOSIS — R0602 Shortness of breath: Secondary | ICD-10-CM | POA: Diagnosis not present

## 2022-11-22 DIAGNOSIS — Z79899 Other long term (current) drug therapy: Secondary | ICD-10-CM | POA: Diagnosis not present

## 2022-11-22 DIAGNOSIS — Z85038 Personal history of other malignant neoplasm of large intestine: Secondary | ICD-10-CM | POA: Diagnosis not present

## 2022-11-22 DIAGNOSIS — J9611 Chronic respiratory failure with hypoxia: Secondary | ICD-10-CM | POA: Diagnosis not present

## 2022-11-22 DIAGNOSIS — J4520 Mild intermittent asthma, uncomplicated: Secondary | ICD-10-CM | POA: Diagnosis not present

## 2022-11-22 DIAGNOSIS — I5032 Chronic diastolic (congestive) heart failure: Secondary | ICD-10-CM

## 2022-11-22 DIAGNOSIS — I10 Essential (primary) hypertension: Secondary | ICD-10-CM | POA: Diagnosis present

## 2022-11-22 DIAGNOSIS — I2510 Atherosclerotic heart disease of native coronary artery without angina pectoris: Secondary | ICD-10-CM | POA: Diagnosis not present

## 2022-11-22 DIAGNOSIS — Z7901 Long term (current) use of anticoagulants: Secondary | ICD-10-CM | POA: Insufficient documentation

## 2022-11-22 DIAGNOSIS — Z87891 Personal history of nicotine dependence: Secondary | ICD-10-CM | POA: Diagnosis not present

## 2022-11-22 DIAGNOSIS — I509 Heart failure, unspecified: Secondary | ICD-10-CM

## 2022-11-22 DIAGNOSIS — R609 Edema, unspecified: Secondary | ICD-10-CM | POA: Diagnosis not present

## 2022-11-22 LAB — COMPREHENSIVE METABOLIC PANEL
ALT: 14 U/L (ref 0–44)
AST: 27 U/L (ref 15–41)
Albumin: 3.5 g/dL (ref 3.5–5.0)
Alkaline Phosphatase: 50 U/L (ref 38–126)
Anion gap: 12 (ref 5–15)
BUN: 19 mg/dL (ref 8–23)
CO2: 24 mmol/L (ref 22–32)
Calcium: 9.1 mg/dL (ref 8.9–10.3)
Chloride: 102 mmol/L (ref 98–111)
Creatinine, Ser: 1.08 mg/dL — ABNORMAL HIGH (ref 0.44–1.00)
GFR, Estimated: 48 mL/min — ABNORMAL LOW (ref >60)
Glucose, Bld: 135 mg/dL — ABNORMAL HIGH (ref 70–99)
Potassium: 3.6 mmol/L (ref 3.5–5.1)
Sodium: 138 mmol/L (ref 135–145)
Total Bilirubin: 1.4 mg/dL — ABNORMAL HIGH (ref 0.3–1.2)
Total Protein: 6.4 g/dL — ABNORMAL LOW (ref 6.5–8.1)

## 2022-11-22 LAB — CBC WITH DIFFERENTIAL/PLATELET
Abs Immature Granulocytes: 0.03 10*3/uL (ref 0.00–0.07)
Basophils Absolute: 0 10*3/uL (ref 0.0–0.1)
Basophils Relative: 0 %
Eosinophils Absolute: 0.2 10*3/uL (ref 0.0–0.5)
Eosinophils Relative: 2 %
HCT: 27.8 % — ABNORMAL LOW (ref 36.0–46.0)
Hemoglobin: 8.6 g/dL — ABNORMAL LOW (ref 12.0–15.0)
Immature Granulocytes: 0 %
Lymphocytes Relative: 11 %
Lymphs Abs: 0.8 10*3/uL (ref 0.7–4.0)
MCH: 28.1 pg (ref 26.0–34.0)
MCHC: 30.9 g/dL (ref 30.0–36.0)
MCV: 90.8 fL (ref 80.0–100.0)
Monocytes Absolute: 0.4 10*3/uL (ref 0.1–1.0)
Monocytes Relative: 6 %
Neutro Abs: 5.7 10*3/uL (ref 1.7–7.7)
Neutrophils Relative %: 81 %
Platelets: 208 10*3/uL (ref 150–400)
RBC: 3.06 MIL/uL — ABNORMAL LOW (ref 3.87–5.11)
RDW: 14.4 % (ref 11.5–15.5)
WBC: 7.1 10*3/uL (ref 4.0–10.5)
nRBC: 0 % (ref 0.0–0.2)

## 2022-11-22 LAB — TROPONIN I (HIGH SENSITIVITY)
Troponin I (High Sensitivity): 28 ng/L — ABNORMAL HIGH (ref <18)
Troponin I (High Sensitivity): 33 ng/L — ABNORMAL HIGH (ref <18)

## 2022-11-22 LAB — MAGNESIUM: Magnesium: 1.5 mg/dL — ABNORMAL LOW (ref 1.7–2.4)

## 2022-11-22 LAB — BRAIN NATRIURETIC PEPTIDE: B Natriuretic Peptide: 632.1 pg/mL — ABNORMAL HIGH (ref 0.0–100.0)

## 2022-11-22 MED ORDER — ALBUTEROL SULFATE (2.5 MG/3ML) 0.083% IN NEBU
2.5000 mg | INHALATION_SOLUTION | Freq: Four times a day (QID) | RESPIRATORY_TRACT | Status: DC | PRN
  Administered 2022-11-23: 2.5 mg via RESPIRATORY_TRACT
  Filled 2022-11-22: qty 3

## 2022-11-22 MED ORDER — DILTIAZEM HCL ER COATED BEADS 180 MG PO CP24
300.0000 mg | ORAL_CAPSULE | Freq: Every day | ORAL | Status: DC
  Administered 2022-11-23 – 2022-11-24 (×2): 300 mg via ORAL
  Filled 2022-11-22 (×2): qty 1

## 2022-11-22 MED ORDER — SODIUM CHLORIDE 0.9% FLUSH
3.0000 mL | Freq: Two times a day (BID) | INTRAVENOUS | Status: DC
  Administered 2022-11-22 – 2022-11-24 (×4): 3 mL via INTRAVENOUS

## 2022-11-22 MED ORDER — ACETAMINOPHEN 325 MG PO TABS
650.0000 mg | ORAL_TABLET | Freq: Four times a day (QID) | ORAL | Status: DC | PRN
  Administered 2022-11-23 – 2022-11-24 (×3): 650 mg via ORAL
  Filled 2022-11-22 (×3): qty 2

## 2022-11-22 MED ORDER — POLYETHYLENE GLYCOL 3350 17 G PO PACK: 17.0000 g | PACK | Freq: Every day | ORAL | Status: DC | PRN

## 2022-11-22 MED ORDER — ESCITALOPRAM OXALATE 10 MG PO TABS
5.0000 mg | ORAL_TABLET | Freq: Every day | ORAL | Status: DC
  Administered 2022-11-23 – 2022-11-24 (×2): 5 mg via ORAL
  Filled 2022-11-22 (×2): qty 1

## 2022-11-22 MED ORDER — ACETAMINOPHEN 500 MG PO TABS
1000.0000 mg | ORAL_TABLET | Freq: Once | ORAL | Status: AC
  Administered 2022-11-22: 1000 mg via ORAL
  Filled 2022-11-22: qty 2

## 2022-11-22 MED ORDER — FUROSEMIDE 10 MG/ML IJ SOLN
40.0000 mg | Freq: Once | INTRAMUSCULAR | Status: AC
  Administered 2022-11-22: 40 mg via INTRAVENOUS
  Filled 2022-11-22: qty 4

## 2022-11-22 MED ORDER — METOPROLOL TARTRATE 25 MG PO TABS
25.0000 mg | ORAL_TABLET | Freq: Two times a day (BID) | ORAL | Status: DC
  Administered 2022-11-22 – 2022-11-24 (×4): 25 mg via ORAL
  Filled 2022-11-22 (×4): qty 1

## 2022-11-22 MED ORDER — MELATONIN 5 MG PO TABS
5.0000 mg | ORAL_TABLET | Freq: Every day | ORAL | Status: DC
  Administered 2022-11-22 – 2022-11-23 (×2): 5 mg via ORAL
  Filled 2022-11-22 (×2): qty 1

## 2022-11-22 MED ORDER — FUROSEMIDE 10 MG/ML IJ SOLN: 60.0000 mg | Freq: Two times a day (BID) | INTRAMUSCULAR | Status: DC

## 2022-11-22 MED ORDER — ALBUTEROL SULFATE HFA 108 (90 BASE) MCG/ACT IN AERS: 2.0000 | INHALATION_SPRAY | Freq: Four times a day (QID) | RESPIRATORY_TRACT | Status: DC | PRN

## 2022-11-22 MED ORDER — APIXABAN 2.5 MG PO TABS
2.5000 mg | ORAL_TABLET | Freq: Two times a day (BID) | ORAL | Status: DC
  Administered 2022-11-22 – 2022-11-24 (×4): 2.5 mg via ORAL
  Filled 2022-11-22 (×4): qty 1

## 2022-11-22 MED ORDER — MELATONIN 5 MG PO CHEW: 5.0000 mg | CHEWABLE_TABLET | Freq: Every evening | ORAL | Status: DC | PRN

## 2022-11-22 MED ORDER — ACETAMINOPHEN 650 MG RE SUPP: 650.0000 mg | Freq: Four times a day (QID) | RECTAL | Status: DC | PRN

## 2022-11-22 NOTE — H&P (Signed)
History and Physical   [name] ZOX:[id number] DOB: Dec 08, 1927 DOA: 11/22/2022  PCP: Thana Ates, MD   Patient coming from: Home/PCP  Chief Complaint: Shortness of breath, edema  HPI: [name] is a 89 y.o. female with medical history significant of  thyroid stately CAD status post stenting, atrial fibrillation, CHF, obesity, hypertension, hyperlipidemia, chronic respiratory failure with hypoxia, asthma, colon cancer presenting with shortness of breath and edema.  Patient was recently admitted earlier this month for CHF exacerbation.  Was diuresed at least 2 kg and presented to PCP for follow-up earlier today.  Was noted to have worsening edema and shortness of breath once again and was sent to the ED for further evaluation.  Patient reports she has been taking her p.o. Lasix as prescribed.  Denies fevers, chills, chest pain, abdominal pain, constipation, diarrhea, nausea, vomiting.  ED Course: Vital signs in the ED notable for blood pressure in the 120s to 140s systolic.  Lab workup included CMP with creatinine stable at 1.08, glucose 135, protein 6.4, T. bili 1.4.  CBC with hemoglobin of 8.6 which is near recent baseline of 9.12 weeks ago but down from previous baseline of 10-11 3 months ago.  Troponin 28 with repeat pending.  BNP elevated to 632.  Patient received Lasix and Tylenol in the ED.  Chest x-ray did show mild chronic interstitial thickening.  Review of Systems: As per HPI otherwise all other systems reviewed and are negative.  Past Medical History:  Diagnosis Date   Adenomatous colon polyp MAY 2013   HOSPITALIZED AT [facility] WITH HGB 7 -TRANSFUSED-MASS FOUND IN ASCENDING COLON   Aortic sclerosis    With murmur   Asthma    CAD S/P percutaneous coronary angioplasty 2002, 2003,2014   a) Unstable Angina 7/'02: 1st Cutting PTCA- OM2 --> restenosed 9/'02 NSTEMI--> 3.0 mm x 12 mm BMS-OM 2; b) 11/'03 DOE w/ + Cardiolite - Cutter PTCA for ISR -- patent in 2004 (after False +  Cardiolite); c) 07/2012 - Unstable Angina -- PCI to proximal RCA with Promus Premier DES 2.75 mm x 20 mm (3.0 mm), patent OM2 stent ~10% ISR (HARDING)   Complication of anesthesia 2009   HIP REPLACEMENT-PT HAD HARD TIME WAKING UP--FELT LIKE SHE COULDN'T BREATHE   Coronary stent restenosis due to scar tissue 05/2002   Cutting Balloon PTCA to OM2 BMS   Diverticulosis 10/2012   Seen on Colonoscopy   DJD (degenerative joint disease), thoracolumbar    Back, Hips & Knees   Essential hypertension    H/O Colon cancer 10/2011   GI - Drs. Leone Payor - colonoscopy 10/2012: diverticulosis, stable ileocolic anastomosis of R colon   Hyperlipidemia with target LDL less than 70    Internal hemorrhoids    Iron deficiency anemia due to chronic blood loss 10/2011   Blood Transfusion (EGD 09/2012 - unremarkable)   Medication intolerance - Plavix, Effient, Brilinta    Easy bruising, GI bleeds (led to Dx of Colon CA), Brilinta - dyspnea   Multiple lung nodules on CT 08/2012   scattered, bilateral but right>left.    Non-Q wave ST elevation myocardial infarction (STEMI) involving left circumflex coronary artery 03/2001   a) Severe thrombic ISR of prior PTCA site in OM2 --> BMS PCI; b) Echo 08/2012: Normal LV size & function.  EF 55-60%, no regional WMA, Gr 1 DD, mild MR, mild-mod TR - NO Pulmonary HTN   Osteoarthritis (arthritis due to wear and tear of joints)    Bilateral Hip Arthroplasty,  L Knee TKA   Osteoporosis     Past Surgical History:  Procedure Laterality Date   APPENDECTOMY     CHOLECYSTECTOMY     COLONOSCOPY  11/16/2011   Procedure: COLONOSCOPY;  Surgeon: Iva Boop, MD;  Location[facility] ENDOSCOPY;  Service: Endoscopy;  Laterality: N/A;   COLONOSCOPY  10/2012   Leone Payor: diverticulosis, stable R colon ileocolic anastomosis   COLONOSCOPY W/ BIOPSIES AND POLYPECTOMY  06/15/2008   adenomatous polyps, diverticulosis, internal hemorrhoids   COLONOSCOPY WITH PROPOFOL N/A 07/18/2015   Procedure: COLONOSCOPY WITH  PROPOFOL;  Surgeon: Iva Boop, MD;  Location: [facility] ENDOSCOPY;  Service: Endoscopy;  Laterality: N/A;   CORONARY ANGIOPLASTY WITH STENT PLACEMENT  2002; 2003; 07/2012   a)  7/'02: 1st Cutting PTCA- OM2 --> restenosed 9/'02 --> 3.0 mm x 12 mm BMS-OM 2; b) 11/'03 - Cutter PTCA for ISR -- patent in 2004;    DILATATION & CURETTAGE/HYSTEROSCOPY WITH MYOSURE N/A 03/21/2022   Procedure: DILATATION & CURETTAGE/HYSTEROSCOPY WITH MYOSURE, PAP SMEAR;  Surgeon: Steva Ready, DO;  Location: [facility] OR;  Service: Gynecology;  Laterality: N/A;   ESOPHAGOGASTRODUODENOSCOPY N/A 10/23/2012   Procedure: ESOPHAGOGASTRODUODENOSCOPY (EGD);  Surgeon: Louis Meckel, MD;  Location: [facility] ENDOSCOPY;  Service: Endoscopy;  Laterality: N/A;   INCISIONAL HERNIA REPAIR N/A 01/29/2013   Procedure: LAPAROSCOPIC INCISIONAL HERNIA;  Surgeon: Shelly Rubenstein, MD;  Location: [facility] ORS;  Service: General;  Laterality: N/A;   INSERTION OF MESH N/A 01/29/2013   Procedure: INSERTION OF MESH;  Surgeon: Shelly Rubenstein, MD;  Location: [facility] ORS;  Service: General;  Laterality: N/A;   LAPAROSCOPY  03/21/2022   Procedure: DIAGNOSTIC LAPAROSCOPY;  Surgeon: Steva Ready, DO;  Location: [facility] OR;  Service: Gynecology;;   LEFT HEART CATHETERIZATION WITH CORONARY ANGIOGRAM N/A 07/15/2012   Procedure: LEFT HEART CATHETERIZATION WITH CORONARY ANGIOGRAM;  Surgeon: Marykay Lex, MD;  Location: [facility] CATH LAB: pRCA 80-90%, OM2 BMS ~10% ISR   NM MYOVIEW LTD  08/2014    Normal LV size and function. Normal wall motion. LOW RISK.   PERCUTANEOUS CORONARY STENT INTERVENTION (PCI-S)  07/15/2012   Procedure: PERCUTANEOUS CORONARY STENT INTERVENTION (PCI-S);  Surgeon: Marykay Lex, MD;  Location: [facility] CATH LAB: c) PCI to proximal RCA with Promus Premier DES 2.75 mm x 20 mm (3.0 mm)   REPLACEMENT TOTAL KNEE Left 2003   TOTAL HIP ARTHROPLASTY  09/08/2007   left, Dr. Lequita Halt   TOTAL HIP ARTHROPLASTY Right 2003   TRANSTHORACIC ECHOCARDIOGRAM  08/2012   Normal LV size  & function.  EF 55-60%, no regional WMA, Gr 1 DD, mild MR, mild-mod TR; aortic sclerosis without stenosis    Social History  reports that she quit smoking about 40 years ago. Her smoking use included cigarettes. She has never used smokeless tobacco. She reports current alcohol use. She reports that she does not use drugs.  Allergies  Allergen Reactions   Brilinta [Ticagrelor] Shortness Of Breath    Bleeding    Lipitor [Atorvastatin] Shortness Of Breath and Other (See Comments)    Myalgias    Plavix [Clopidogrel] Shortness Of Breath and Other (See Comments)    Bleeding    Codeine Nausea And Vomiting   Mobic [Meloxicam] Other (See Comments)    Stomach upset   Paxil [Paroxetine] Other (See Comments)    Stomach upset Myalgias  Chills    Sulfa Antibiotics Diarrhea and Other (See Comments)    Stomach upset   Sulfonamide Derivatives Diarrhea and Other (See Comments)  Stomach upset    Family History  Problem Relation Age of Onset   Pancreatic cancer Father    Prostate cancer Father    Skin cancer Father    Heart disease Mother    Hypertension Mother    Pancreatic cancer Brother    Heart disease Sister    Colon cancer Brother        ? Colostomy   Prostate cancer Brother    Colon cancer Sister    Melanoma Brother   Reviewed on admission  Prior to Admission medications   Medication Sig Start Date End Date Taking? Authorizing Provider  acetaminophen (TYLENOL) 500 MG tablet Take 500 mg by mouth in the morning and at bedtime. 02/24/16   [provider]  albuterol (PROVENTIL HFA;VENTOLIN HFA) 108 (90 BASE) MCG/ACT inhaler Inhale 2 puffs into the lungs every 6 (six) hours as needed for wheezing.    [provider]  apixaban (ELIQUIS) 2.5 MG TABS tablet Take 1 tablet (2.5 mg total) by mouth 2 (two) times daily. 11/06/22   Arrien, York Ram, MD  cetirizine (ZYRTEC) 10 MG tablet Take 10 mg by mouth at bedtime.    [provider]  diltiazem (CARDIZEM CD)  300 MG 24 hr capsule Take 1 capsule (300 mg total) by mouth daily. 08/08/22 11/12/22  Ghimire, Werner Lean, MD  escitalopram (LEXAPRO) 5 MG tablet Take 5 mg by mouth daily.    [provider]  fluticasone (FLONASE) 50 MCG/ACT nasal spray Place 1 spray into the nose daily as needed for allergies.    [provider]  furosemide (LASIX) 40 MG tablet Take 1 tablet (40 mg total) by mouth daily. 08/09/22   Ghimire, Werner Lean, MD  Melatonin 5 MG CHEW Chew 5 mg by mouth at bedtime as needed (sleep).    [provider]  metoprolol tartrate (LOPRESSOR) 25 MG tablet Take 25 mg by mouth 2 (two) times daily.    [provider]  Multiple Vitamin (MULTIVITAMIN) tablet Take 1 tablet by mouth daily.    [provider]  nitroGLYCERIN (NITROSTAT) 0.4 MG SL tablet Place 1 tablet (0.4 mg total) under the tongue every 5 (five) minutes as needed for chest pain. 08/10/22   Jodelle Gross, NP    Physical Exam: Vitals:   11/22/22 1345 11/22/22 1445 11/22/22 1530 11/22/22 1614  BP: 132/71 127/79 (!) 148/84   Pulse: (!) 57 62 68   Resp: 18 20 (!) 23   Temp:    97.7 F (36.5 C)  SpO2: 100% 100% 100%     Physical Exam Constitutional:      General: She is not in acute distress.    Appearance: Normal appearance.  HENT:     Head: Normocephalic and atraumatic.     Mouth/Throat:     Mouth: Mucous membranes are moist.     Pharynx: Oropharynx is clear.  Eyes:     Extraocular Movements: Extraocular movements intact.     Pupils: Pupils are equal, round, and reactive to light.  Cardiovascular:     Rate and Rhythm: Normal rate and regular rhythm.     Pulses: Normal pulses.     Heart sounds: Normal heart sounds.  Pulmonary:     Effort: Pulmonary effort is normal. No respiratory distress.  Abdominal:     General: Bowel sounds are normal. There is no distension.     Palpations: Abdomen is soft.     Tenderness: There is no abdominal tenderness.  Musculoskeletal:  General: No swelling or deformity.     Right lower leg: Edema present.     Left lower leg: Edema present.  Skin:    General: Skin is warm and dry.  Neurological:     General: No focal deficit present.     Mental Status: Mental status is at baseline.    Labs on Admission: I have personally reviewed following labs and imaging studies  CBC: Recent Labs  Lab 11/22/22 1248  WBC 7.1  NEUTROABS 5.7  HGB 8.6*  HCT 27.8*  MCV 90.8  PLT 208    Basic Metabolic Panel: Recent Labs  Lab 11/22/22 1248  NA 138  K 3.6  CL 102  CO2 24  GLUCOSE 135*  BUN 19  CREATININE 1.08*  CALCIUM 9.1    GFR: Estimated Creatinine Clearance: 26.6 mL/min (A) (by C-G formula based on SCr of 1.08 mg/dL (H)).  Liver Function Tests: Recent Labs  Lab 11/22/22 1248  AST 27  ALT 14  ALKPHOS 50  BILITOT 1.4*  PROT 6.4*  ALBUMIN 3.5    Urine analysis:    Component Value Date/Time   COLORURINE YELLOW 09/15/2016 2104   APPEARANCEUR CLEAR 09/15/2016 2104   LABSPEC 1.017 09/15/2016 2104   PHURINE 5.0 09/15/2016 2104   GLUCOSEU NEGATIVE 09/15/2016 2104   HGBUR NEGATIVE 09/15/2016 2104   BILIRUBINUR NEGATIVE 09/15/2016 2104   KETONESUR NEGATIVE 09/15/2016 2104   PROTEINUR NEGATIVE 09/15/2016 2104   UROBILINOGEN 0.2 10/22/2012 1726   NITRITE NEGATIVE 09/15/2016 2104   LEUKOCYTESUR SMALL (A) 09/15/2016 2104    Radiological Exams on Admission: DG Chest Portable 1 View  Result Date: 11/22/2022 CLINICAL DATA:  Dyspnea.  Shortness of breath.  Extremity swelling. EXAM: PORTABLE CHEST 1 VIEW COMPARISON:  Chest radiographs 11/03/2022 and 08/06/2022 FINDINGS: Cardiac silhouette is again mildly enlarged. Mild-to-moderate calcification within aortic arch. Mildly decreased lung volumes. Mild bilateral chronic interstitial thickening is mildly improved from 11/03/2022 and 08/06/2022 radiographs. This appears unchanged from 07/27/2022. No focal airspace opacity. No pleural effusion pneumothorax. No acute  skeletal abnormality. Severe bilateral glenohumeral osteoarthritis. IMPRESSION: 1. No active disease. 2. Mild chronic interstitial thickening, mildly improved from 11/03/2022 and 08/06/2022. Electronically Signed   By: Neita Garnet M.D.   On: 11/22/2022 13:05    EKG: Not performed in the emergency department  Assessment/Plan Principal Problem:   Acute on chronic diastolic CHF (congestive heart failure) (HCC) Active Problems:   Obesity (BMI 30-39.9)   Essential hypertension   CAD S/P PCI: OM2 PTCA 7/02, OM2 Stent 9/02, ISR 11/03 - PTCA; RCA DES 1 /14/14   Chronic hypoxic respiratory failure (HCC)   Carcinoma of ascending colon (HCC)   Asthma   Hyperlipidemia with target LDL less than 70   Acute on chronic diastolic CHF > Patient recently admitted for the same earlier this month.  Had improved but since that time has had worsening edema and shortness of breath despite taking p.o. Lasix. > At PCP follow-up today due to worsening edema and shortness of breath was sent to the ED for further evaluation. > Noted to have edema, chest x-ray with mild chronic interstitial thickening.  BNP elevated to 632 which is higher than previous.  Troponin mildly elevated at 28 with repeat pending.  Received 40 mg IV Lasix in the ED. - Monitor on telemetry overnight - Continue with Lasix 60 mg IV twice daily starting tomorrow - Strict I's and O's, daily weights - Hold off on repeat echocardiogram for now - Supportive care - Fluid restricted  diet - May need increased home dose to prevent readmission  Atrial fibrillation > During recent admission and amiodarone was held due to symptomatic bradycardia.  Metoprolol and diltiazem were continued. - Continue home metoprolol, diltiazem, Eliquis  CAD status post multiple stenting - Continue home metoprolol, Eliquis  Hypertension - Continue home diltiazem and metoprolol  - On Lasix as above  Asthma Chronic respiratory failure with hypoxia > On chronic 2  L. - Continue supplemental oxygen - Continue as needed albuterol  Obesity History of colon cancer - Noted  DVT prophylaxis: Eliquis Code Status:   Full Family Communication:  Daughter updated by phone  Disposition Plan:   Patient is from:  Home  Anticipated DC to:  Home  Anticipated DC date:  1 to 3 days  Anticipated DC barriers: None  Consults called:  None Admission status:  Duration, telemetry  Severity of Illness: The appropriate patient status for this patient is OBSERVATION. Observation status is judged to be reasonable and necessary in order to provide the required intensity of service to ensure the patient's safety. The patient's presenting symptoms, physical exam findings, and initial radiographic and laboratory data in the context of their medical condition is felt to place them at decreased risk for further clinical deterioration. Furthermore, it is anticipated that the patient will be medically stable for discharge from the hospital within 2 midnights of admission.    Synetta Fail MD [facility]  How to contact the [facility] Attending or Consulting provider 7A - 7P or covering provider during after hours 7P -7A, for this patient?   Check the care team in [facility] and look for a) attending/consulting [facility] provider listed and b) the [facility] team listed Log into [email or web address] and use [facility] universal password to access. If you do not have the password, please contact the hospital operator. Locate the [facility] provider you are looking for under [facility] and page to a number that you can be directly reached. If you still have difficulty reaching the provider, please page the [facility] (Director on Call) for the Hospitalists listed on amion for assistance.  11/22/2022, 5:28 PM

## 2022-11-22 NOTE — ED Triage Notes (Signed)
Pt to the ed from doctors office with a CC of sob swelling to her extremities. Pt has a hx of CHF and was hospitalized for chf  with fluid overload 2 weeks prior. Pt is on 2 lpm o2 and base line and is maintaining saturation of greater that 92% with 2 lpm. Pt relay some cp. EMS gave .4 of nitro without improvement to cp.

## 2022-11-22 NOTE — ED Provider Notes (Signed)
[place] EMERGENCY DEPARTMENT AT [facility] Provider Note  CSN: [id number] Arrival date & time: 11/22/22 1211  Chief Complaint(s) Shortness of Breath and Leg Swelling  HPI [name] is a 89 y.o. female with PMH colon cancer s/p resection, CHF, HTN, CAD, A-fib, chronic hypoxemic respiratory failure on 2 L who presents emergency department for evaluation of shortness of breath and lower extremity edema.  Patient recently discharged from the hospital on 11/06/2022 for CHF exacerbation and followed up outpatient with her PCP today where she was found to have worsening lower extremity edema and persistent shortness of breath who then transferred her back to the emergency department for further evaluation.  Here in the ER, patient endorsing lower extremity swelling, shortness of breath but denies chest pain, headache, fever, abdominal pain, nausea, vomiting or other systemic symptoms.   Past Medical History Past Medical History:  Diagnosis Date   Adenomatous colon polyp MAY 2013   HOSPITALIZED AT [facility] WITH HGB 7 -TRANSFUSED-MASS FOUND IN ASCENDING COLON   Aortic sclerosis    With murmur   Asthma    CAD S/P percutaneous coronary angioplasty 2002, 2003,2014   a) Unstable Angina 7/'02: 1st Cutting PTCA- OM2 --> restenosed 9/'02 NSTEMI--> 3.0 mm x 12 mm BMS-OM 2; b) 11/'03 DOE w/ + Cardiolite - Cutter PTCA for ISR -- patent in 2004 (after False + Cardiolite); c) 07/2012 - Unstable Angina -- PCI to proximal RCA with Promus Premier DES 2.75 mm x 20 mm (3.0 mm), patent OM2 stent ~10% ISR (HARDING)   Complication of anesthesia 2009   HIP REPLACEMENT-PT HAD HARD TIME WAKING UP--FELT LIKE SHE COULDN'T BREATHE   Coronary stent restenosis due to scar tissue 05/2002   Cutting Balloon PTCA to OM2 BMS   Diverticulosis 10/2012   Seen on Colonoscopy   DJD (degenerative joint disease), thoracolumbar    Back, Hips & Knees   Essential hypertension    H/O Colon cancer 10/2011   GI - Drs. Leone Payor -  colonoscopy 10/2012: diverticulosis, stable ileocolic anastomosis of R colon   Hyperlipidemia with target LDL less than 70    Internal hemorrhoids    Iron deficiency anemia due to chronic blood loss 10/2011   Blood Transfusion (EGD 09/2012 - unremarkable)   Medication intolerance - Plavix, Effient, Brilinta    Easy bruising, GI bleeds (led to Dx of Colon CA), Brilinta - dyspnea   Multiple lung nodules on CT 08/2012   scattered, bilateral but right>left.    Non-Q wave ST elevation myocardial infarction (STEMI) involving left circumflex coronary artery 03/2001   a) Severe thrombic ISR of prior PTCA site in OM2 --> BMS PCI; b) Echo 08/2012: Normal LV size & function.  EF 55-60%, no regional WMA, Gr 1 DD, mild MR, mild-mod TR - NO Pulmonary HTN   Osteoarthritis (arthritis due to wear and tear of joints)    Bilateral Hip Arthroplasty, L Knee TKA   Osteoporosis    Patient Active Problem List   Diagnosis Date Noted   Paroxysmal A-fib (HCC) 11/03/2022   Acute on chronic diastolic heart failure (HCC) 11/03/2022   Chronic hypoxic respiratory failure (HCC) 11/03/2022   Hypercoagulable state due to paroxysmal atrial fibrillation (HCC) 10/22/2022   Acute respiratory failure with hypoxia (HCC) 08/06/2022   Acute on chronic diastolic CHF (congestive heart failure) (HCC) 08/06/2022   Asthma, chronic, unspecified asthma severity, with acute exacerbation 08/06/2022   Atrial fibrillation with rapid ventricular response (HCC) 07/27/2022   Leukocytosis 09/15/2016   Change in bowel  habits    History of colon cancer    Atypical chest pain 09/19/2014   Obesity (BMI 30-39.9) 03/14/2014   Medication intolerance - Plavix, Effient, Brilinta    Hyperlipidemia with target LDL less than 70    Essential hypertension    Diverticulosis of colon without hemorrhage 03/16/2013   Incisional hernia, without obstruction or gangrene 11/28/2012   Asthma 10/22/2012   AKI (acute kidney injury) (HCC) 10/22/2012   Carcinoma of  ascending colon (HCC) 11/16/2011   PERSONAL HX COLONIC POLYPS 11/29/2009   S/P NSTEMI -- involving left circumflex coronary artery (OM2) In-stent Thrombosis 03/02/2001   CAD S/P PCI: OM2 PTCA 7/02, OM2 Stent 9/02, ISR 11/03 - PTCA; RCA DES 1 /14/14 12/31/2000   Home Medication(s) Prior to Admission medications   Medication Sig Start Date End Date Taking? Authorizing Provider  acetaminophen (TYLENOL) 500 MG tablet Take 500 mg by mouth in the morning and at bedtime. 02/24/16   [provider]  albuterol (PROVENTIL HFA;VENTOLIN HFA) 108 (90 BASE) MCG/ACT inhaler Inhale 2 puffs into the lungs every 6 (six) hours as needed for wheezing.    [provider]  apixaban (ELIQUIS) 2.5 MG TABS tablet Take 1 tablet (2.5 mg total) by mouth 2 (two) times daily. 11/06/22   Arrien, York Ram, MD  cetirizine (ZYRTEC) 10 MG tablet Take 10 mg by mouth at bedtime.    [provider]  diltiazem (CARDIZEM CD) 300 MG 24 hr capsule Take 1 capsule (300 mg total) by mouth daily. 08/08/22 11/12/22  Ghimire, Werner Lean, MD  escitalopram (LEXAPRO) 5 MG tablet Take 5 mg by mouth daily.    [provider]  fluticasone (FLONASE) 50 MCG/ACT nasal spray Place 1 spray into the nose daily as needed for allergies.    [provider]  furosemide (LASIX) 40 MG tablet Take 1 tablet (40 mg total) by mouth daily. 08/09/22   Ghimire, Werner Lean, MD  Melatonin 5 MG CHEW Chew 5 mg by mouth at bedtime as needed (sleep).    [provider]  metoprolol tartrate (LOPRESSOR) 25 MG tablet Take 25 mg by mouth 2 (two) times daily.    [provider]  Multiple Vitamin (MULTIVITAMIN) tablet Take 1 tablet by mouth daily.    [provider]  nitroGLYCERIN (NITROSTAT) 0.4 MG SL tablet Place 1 tablet (0.4 mg total) under the tongue every 5 (five) minutes as needed for chest pain. 08/10/22   Jodelle Gross, NP                                                                                                                                     Past Surgical History Past Surgical History:  Procedure Laterality Date   APPENDECTOMY     CHOLECYSTECTOMY     COLONOSCOPY  11/16/2011   Procedure: COLONOSCOPY;  Surgeon: Iva Boop, MD;  Location: [facility] ENDOSCOPY;  Service: Endoscopy;  Laterality: N/A;  COLONOSCOPY  10/2012   Gessner: diverticulosis, stable R colon ileocolic anastomosis   COLONOSCOPY W/ BIOPSIES AND POLYPECTOMY  06/15/2008   adenomatous polyps, diverticulosis, internal hemorrhoids   COLONOSCOPY WITH PROPOFOL N/A 07/18/2015   Procedure: COLONOSCOPY WITH PROPOFOL;  Surgeon: Iva Boop, MD;  Location: [facility] ENDOSCOPY;  Service: Endoscopy;  Laterality: N/A;   CORONARY ANGIOPLASTY WITH STENT PLACEMENT  2002; 2003; 07/2012   a)  7/'02: 1st Cutting PTCA- OM2 --> restenosed 9/'02 --> 3.0 mm x 12 mm BMS-OM 2; b) 11/'03 - Cutter PTCA for ISR -- patent in 2004;    DILATATION & CURETTAGE/HYSTEROSCOPY WITH MYOSURE N/A 03/21/2022   Procedure: DILATATION & CURETTAGE/HYSTEROSCOPY WITH MYOSURE, PAP SMEAR;  Surgeon: Steva Ready, DO;  Location: [facility] OR;  Service: Gynecology;  Laterality: N/A;   ESOPHAGOGASTRODUODENOSCOPY N/A 10/23/2012   Procedure: ESOPHAGOGASTRODUODENOSCOPY (EGD);  Surgeon: Louis Meckel, MD;  Location: [facility] ENDOSCOPY;  Service: Endoscopy;  Laterality: N/A;   INCISIONAL HERNIA REPAIR N/A 01/29/2013   Procedure: LAPAROSCOPIC INCISIONAL HERNIA;  Surgeon: Shelly Rubenstein, MD;  Location: [facility] ORS;  Service: General;  Laterality: N/A;   INSERTION OF MESH N/A 01/29/2013   Procedure: INSERTION OF MESH;  Surgeon: Shelly Rubenstein, MD;  Location: [facility] ORS;  Service: General;  Laterality: N/A;   LAPAROSCOPY  03/21/2022   Procedure: DIAGNOSTIC LAPAROSCOPY;  Surgeon: Steva Ready, DO;  Location: [facility] OR;  Service: Gynecology;;   LEFT HEART CATHETERIZATION WITH CORONARY ANGIOGRAM N/A 07/15/2012   Procedure: LEFT HEART CATHETERIZATION WITH CORONARY ANGIOGRAM;  Surgeon: Marykay Lex,  MD;  Location: [facility] CATH LAB: pRCA 80-90%, OM2 BMS ~10% ISR   NM MYOVIEW LTD  08/2014    Normal LV size and function. Normal wall motion. LOW RISK.   PERCUTANEOUS CORONARY STENT INTERVENTION (PCI-S)  07/15/2012   Procedure: PERCUTANEOUS CORONARY STENT INTERVENTION (PCI-S);  Surgeon: Marykay Lex, MD;  Location: [facility] CATH LAB: c) PCI to proximal RCA with Promus Premier DES 2.75 mm x 20 mm (3.0 mm)   REPLACEMENT TOTAL KNEE Left 2003   TOTAL HIP ARTHROPLASTY  09/08/2007   left, Dr. Lequita Halt   TOTAL HIP ARTHROPLASTY Right 2003   TRANSTHORACIC ECHOCARDIOGRAM  08/2012   Normal LV size & function.  EF 55-60%, no regional WMA, Gr 1 DD, mild MR, mild-mod TR; aortic sclerosis without stenosis   Family History Family History  Problem Relation Age of Onset   Pancreatic cancer Father    Prostate cancer Father    Skin cancer Father    Heart disease Mother    Hypertension Mother    Pancreatic cancer Brother    Heart disease Sister    Colon cancer Brother        ? Colostomy   Prostate cancer Brother    Colon cancer Sister    Melanoma Brother     Social History Social History   Tobacco Use   Smoking status: Former    Years: 30    Types: Cigarettes    Quit date: 07/02/1982    Years since quitting: 40.4   Smokeless tobacco: Never  Vaping Use   Vaping Use: Never used  Substance Use Topics   Alcohol use: Yes    Alcohol/week: 0.0 standard drinks of alcohol    Comment: occ wine   Drug use: No   Allergies Brilinta [ticagrelor], Lipitor [atorvastatin], Plavix [clopidogrel], Codeine, Mobic [meloxicam], Paxil [paroxetine], Sulfa antibiotics, and Sulfonamide derivatives  Review of Systems Review of Systems  Respiratory:  Positive for cough and shortness of breath.  Cardiovascular:  Positive for leg swelling.    Physical Exam Vital Signs  I have reviewed the triage vital signs BP 132/71   Pulse (!) 57   Resp 18   SpO2 100%   Physical Exam Vitals and nursing note reviewed.   Constitutional:      General: She is not in acute distress.    Appearance: She is well-developed.  HENT:     Head: Normocephalic and atraumatic.  Eyes:     Conjunctiva/sclera: Conjunctivae normal.  Cardiovascular:     Rate and Rhythm: Normal rate and regular rhythm.     Heart sounds: No murmur heard. Pulmonary:     Effort: Pulmonary effort is normal. No respiratory distress.     Breath sounds: Rales present.  Abdominal:     Palpations: Abdomen is soft.     Tenderness: There is no abdominal tenderness.  Musculoskeletal:        General: No swelling.     Cervical back: Neck supple.     Right lower leg: Edema present.     Left lower leg: Edema present.  Skin:    General: Skin is warm and dry.     Capillary Refill: Capillary refill takes less than 2 seconds.  Neurological:     Mental Status: She is alert.  Psychiatric:        Mood and Affect: Mood normal.     ED Results and Treatments Labs (all labs ordered are listed, but only abnormal results are displayed) Labs Reviewed  COMPREHENSIVE METABOLIC PANEL - Abnormal; Notable for the following components:      Result Value   Glucose, Bld 135 (*)    Creatinine, Ser 1.08 (*)    Total Protein 6.4 (*)    Total Bilirubin 1.4 (*)    GFR, Estimated 48 (*)    All other components within normal limits  CBC WITH DIFFERENTIAL/PLATELET - Abnormal; Notable for the following components:   RBC 3.06 (*)    Hemoglobin 8.6 (*)    HCT 27.8 (*)    All other components within normal limits  BRAIN NATRIURETIC PEPTIDE - Abnormal; Notable for the following components:   B Natriuretic Peptide 632.1 (*)    All other components within normal limits  TROPONIN I (HIGH SENSITIVITY) - Abnormal; Notable for the following components:   Troponin I (High Sensitivity) 28 (*)    All other components within normal limits  TROPONIN I (HIGH SENSITIVITY)                                                                                                                           Radiology DG Chest Portable 1 View  Result Date: 11/22/2022 CLINICAL DATA:  Dyspnea.  Shortness of breath.  Extremity swelling. EXAM: PORTABLE CHEST 1 VIEW COMPARISON:  Chest radiographs 11/03/2022 and 08/06/2022 FINDINGS: Cardiac silhouette is again mildly enlarged. Mild-to-moderate calcification within aortic arch. Mildly decreased lung volumes. Mild bilateral chronic interstitial thickening is mildly improved from 11/03/2022 and  08/06/2022 radiographs. This appears unchanged from 07/27/2022. No focal airspace opacity. No pleural effusion pneumothorax. No acute skeletal abnormality. Severe bilateral glenohumeral osteoarthritis. IMPRESSION: 1. No active disease. 2. Mild chronic interstitial thickening, mildly improved from 11/03/2022 and 08/06/2022. Electronically Signed   By: Neita Garnet M.D.   On: 11/22/2022 13:05    Pertinent labs & imaging results that were available during my care of the patient were reviewed by me and considered in my medical decision making (see MDM for details).  Medications Ordered in ED Medications  furosemide (LASIX) injection 40 mg (has no administration in time range)  acetaminophen (TYLENOL) tablet 1,000 mg (1,000 mg Oral Given 11/22/22 1447)                                                                                                                                     Procedures Procedures  (including critical care time)  Medical Decision Making / ED Course   This patient presents to the ED for concern of shortness of breath, this involves an extensive number of treatment options, and is a complaint that carries with it a high risk of complications and morbidity.  The differential diagnosis includes Pe, PTX, Pulmonary Edema, ARDS, COPD/Asthma, ACS, CHF exacerbation, Arrhythmia, Pericardial Effusion/Tamponade, Anemia, Sepsis, Acidosis/Hypercapnia, Anxiety, Viral URI  MDM: Patient seen emergency room for evaluation of shortness of breath.   Physical exam with faint rales at bilateral bases, lower extremity edema but is otherwise unremarkable.  Laboratory evaluation with a downtrending hemoglobin to 8.6 from 9.12 weeks ago, creatinine 1.08, high-sensitivity troponin 28 likely type II demand ischemia, BNP is elevated at 632.1.  Chest x-ray largely unremarkable.  Patient presentation consistent with CHF exacerbation and as she has been compliant with her Lasix at home, patient require hospital admission for failure of outpatient diuretic regimen.  Lasix initiated and patient admitted.   Additional history obtained: -Additional history obtained from daughter -External records from outside source obtained and reviewed including: Chart review including previous notes, labs, imaging, consultation notes   Lab Tests: -I ordered, reviewed, and interpreted labs.   The pertinent results include:   Labs Reviewed  COMPREHENSIVE METABOLIC PANEL - Abnormal; Notable for the following components:      Result Value   Glucose, Bld 135 (*)    Creatinine, Ser 1.08 (*)    Total Protein 6.4 (*)    Total Bilirubin 1.4 (*)    GFR, Estimated 48 (*)    All other components within normal limits  CBC WITH DIFFERENTIAL/PLATELET - Abnormal; Notable for the following components:   RBC 3.06 (*)    Hemoglobin 8.6 (*)    HCT 27.8 (*)    All other components within normal limits  BRAIN NATRIURETIC PEPTIDE - Abnormal; Notable for the following components:   B Natriuretic Peptide 632.1 (*)    All other components within normal limits  TROPONIN I (HIGH SENSITIVITY) - Abnormal; Notable for the  following components:   Troponin I (High Sensitivity) 28 (*)    All other components within normal limits  TROPONIN I (HIGH SENSITIVITY)     Imaging Studies ordered: I ordered imaging studies including chest x-ray I independently visualized and interpreted imaging. I agree with the radiologist interpretation   Medicines ordered and prescription drug  management: Meds ordered this encounter  Medications   acetaminophen (TYLENOL) tablet 1,000 mg   furosemide (LASIX) injection 40 mg    -I have reviewed the patients home medicines and have made adjustments as needed  Critical interventions none   Cardiac Monitoring: The patient was maintained on a cardiac monitor.  I personally viewed and interpreted the cardiac monitored which showed an underlying rhythm of: A-fib, rate controlled  Social Determinants of Health:  Factors impacting patients care include: none   Reevaluation: After the interventions noted above, I reevaluated the patient and found that they have :improved  Co morbidities that complicate the patient evaluation  Past Medical History:  Diagnosis Date   Adenomatous colon polyp MAY 2013   HOSPITALIZED AT [facility] WITH HGB 7 -TRANSFUSED-MASS FOUND IN ASCENDING COLON   Aortic sclerosis    With murmur   Asthma    CAD S/P percutaneous coronary angioplasty 2002, 2003,2014   a) Unstable Angina 7/'02: 1st Cutting PTCA- OM2 --> restenosed 9/'02 NSTEMI--> 3.0 mm x 12 mm BMS-OM 2; b) 11/'03 DOE w/ + Cardiolite - Cutter PTCA for ISR -- patent in 2004 (after False + Cardiolite); c) 07/2012 - Unstable Angina -- PCI to proximal RCA with Promus Premier DES 2.75 mm x 20 mm (3.0 mm), patent OM2 stent ~10% ISR (HARDING)   Complication of anesthesia 2009   HIP REPLACEMENT-PT HAD HARD TIME WAKING UP--FELT LIKE SHE COULDN'T BREATHE   Coronary stent restenosis due to scar tissue 05/2002   Cutting Balloon PTCA to OM2 BMS   Diverticulosis 10/2012   Seen on Colonoscopy   DJD (degenerative joint disease), thoracolumbar    Back, Hips & Knees   Essential hypertension    H/O Colon cancer 10/2011   GI - Drs. Leone Payor - colonoscopy 10/2012: diverticulosis, stable ileocolic anastomosis of R colon   Hyperlipidemia with target LDL less than 70    Internal hemorrhoids    Iron deficiency anemia due to chronic blood loss 10/2011   Blood Transfusion (EGD  09/2012 - unremarkable)   Medication intolerance - Plavix, Effient, Brilinta    Easy bruising, GI bleeds (led to Dx of Colon CA), Brilinta - dyspnea   Multiple lung nodules on CT 08/2012   scattered, bilateral but right>left.    Non-Q wave ST elevation myocardial infarction (STEMI) involving left circumflex coronary artery 03/2001   a) Severe thrombic ISR of prior PTCA site in OM2 --> BMS PCI; b) Echo 08/2012: Normal LV size & function.  EF 55-60%, no regional WMA, Gr 1 DD, mild MR, mild-mod TR - NO Pulmonary HTN   Osteoarthritis (arthritis due to wear and tear of joints)    Bilateral Hip Arthroplasty, L Knee TKA   Osteoporosis       Dispostion: I considered admission for this patient, and due to fluid overload and suspected CHF exacerbation patient require hospital admission     Final Clinical Impression(s) / ED Diagnoses Final diagnoses:  Acute on chronic congestive heart failure, unspecified heart failure type [facility])     @PCDICTATION @    Glendora Score, MD 11/22/22 [phone]

## 2022-11-22 NOTE — ED Notes (Signed)
ED TO INPATIENT HANDOFF REPORT  ED Nurse Name and Phone #: Angelica Chessman, [phone]  S Name/Age/Gender [name] 89 y.o. female Room/Bed: 033C/033C  Code Status   Code Status: Full Code  Home/SNF/Other Home Patient oriented to: self, place, time, and situation Is this baseline? Yes   Triage Complete: Triage complete  Chief Complaint Acute on chronic diastolic CHF (congestive heart failure) (HCC) [I50.33]  Triage Note Pt to the ed from doctors office with a CC of sob swelling to her extremities. Pt has a hx of CHF and was hospitalized for chf  with fluid overload 2 weeks prior. Pt is on 2 lpm o2 and base line and is maintaining saturation of greater that 92% with 2 lpm. Pt relay some cp. EMS gave .4 of nitro without improvement to cp.    Allergies Allergies  Allergen Reactions   Brilinta [Ticagrelor] Shortness Of Breath    Bleeding    Lipitor [Atorvastatin] Shortness Of Breath and Other (See Comments)    Myalgias    Plavix [Clopidogrel] Shortness Of Breath and Other (See Comments)    Bleeding    Codeine Nausea And Vomiting   Mobic [Meloxicam] Other (See Comments)    Stomach upset   Paxil [Paroxetine] Other (See Comments)    Stomach upset Myalgias  Chills    Sulfa Antibiotics Diarrhea and Other (See Comments)    Stomach upset   Sulfonamide Derivatives Diarrhea and Other (See Comments)    Stomach upset    Level of Care/Admitting Diagnosis ED Disposition     ED Disposition  Admit   Condition  --   Comment  Hospital Area: [facility] [[id number]]  Level of Care: Telemetry Cardiac [[id number]]  May place patient in observation at [facility] or [facility] if equivalent level of care is available:: No  Covid Evaluation: Asymptomatic - no recent exposure (last 10 days) testing not required  Diagnosis: Acute on chronic diastolic CHF (congestive heart failure) [facility]) [[id number]]  Admitting Physician: Synetta Fail [[id number]]  Attending Physician: Synetta Fail  [phone]          B Medical/Surgery History Past Medical History:  Diagnosis Date   Adenomatous colon polyp MAY 2013   HOSPITALIZED AT [facility] WITH HGB 7 -TRANSFUSED-MASS FOUND IN ASCENDING COLON   Aortic sclerosis    With murmur   Asthma    CAD S/P percutaneous coronary angioplasty 2002, 2003,2014   a) Unstable Angina 7/'02: 1st Cutting PTCA- OM2 --> restenosed 9/'02 NSTEMI--> 3.0 mm x 12 mm BMS-OM 2; b) 11/'03 DOE w/ + Cardiolite - Cutter PTCA for ISR -- patent in 2004 (after False + Cardiolite); c) 07/2012 - Unstable Angina -- PCI to proximal RCA with Promus Premier DES 2.75 mm x 20 mm (3.0 mm), patent OM2 stent ~10% ISR (HARDING)   Complication of anesthesia 2009   HIP REPLACEMENT-PT HAD HARD TIME WAKING UP--FELT LIKE SHE COULDN'T BREATHE   Coronary stent restenosis due to scar tissue 05/2002   Cutting Balloon PTCA to OM2 BMS   Diverticulosis 10/2012   Seen on Colonoscopy   DJD (degenerative joint disease), thoracolumbar    Back, Hips & Knees   Essential hypertension    H/O Colon cancer 10/2011   GI - Drs. Leone Payor - colonoscopy 10/2012: diverticulosis, stable ileocolic anastomosis of R colon   Hyperlipidemia with target LDL less than 70    Internal hemorrhoids    Iron deficiency anemia due to chronic blood loss 10/2011   Blood Transfusion (EGD 09/2012 -  unremarkable)   Medication intolerance - Plavix, Effient, Brilinta    Easy bruising, GI bleeds (led to Dx of Colon CA), Brilinta - dyspnea   Multiple lung nodules on CT 08/2012   scattered, bilateral but right>left.    Non-Q wave ST elevation myocardial infarction (STEMI) involving left circumflex coronary artery 03/2001   a) Severe thrombic ISR of prior PTCA site in OM2 --> BMS PCI; b) Echo 08/2012: Normal LV size & function.  EF 55-60%, no regional WMA, Gr 1 DD, mild MR, mild-mod TR - NO Pulmonary HTN   Osteoarthritis (arthritis due to wear and tear of joints)    Bilateral Hip Arthroplasty, L Knee TKA   Osteoporosis    Past  Surgical History:  Procedure Laterality Date   APPENDECTOMY     CHOLECYSTECTOMY     COLONOSCOPY  11/16/2011   Procedure: COLONOSCOPY;  Surgeon: Iva Boop, MD;  Location: [facility] ENDOSCOPY;  Service: Endoscopy;  Laterality: N/A;   COLONOSCOPY  10/2012   Leone Payor: diverticulosis, stable R colon ileocolic anastomosis   COLONOSCOPY W/ BIOPSIES AND POLYPECTOMY  06/15/2008   adenomatous polyps, diverticulosis, internal hemorrhoids   COLONOSCOPY WITH PROPOFOL N/A 07/18/2015   Procedure: COLONOSCOPY WITH PROPOFOL;  Surgeon: Iva Boop, MD;  Location: [facility] ENDOSCOPY;  Service: Endoscopy;  Laterality: N/A;   CORONARY ANGIOPLASTY WITH STENT PLACEMENT  2002; 2003; 07/2012   a)  7/'02: 1st Cutting PTCA- OM2 --> restenosed 9/'02 --> 3.0 mm x 12 mm BMS-OM 2; b) 11/'03 - Cutter PTCA for ISR -- patent in 2004;    DILATATION & CURETTAGE/HYSTEROSCOPY WITH MYOSURE N/A 03/21/2022   Procedure: DILATATION & CURETTAGE/HYSTEROSCOPY WITH MYOSURE, PAP SMEAR;  Surgeon: Steva Ready, DO;  Location: [facility] OR;  Service: Gynecology;  Laterality: N/A;   ESOPHAGOGASTRODUODENOSCOPY N/A 10/23/2012   Procedure: ESOPHAGOGASTRODUODENOSCOPY (EGD);  Surgeon: Louis Meckel, MD;  Location: [facility] ENDOSCOPY;  Service: Endoscopy;  Laterality: N/A;   INCISIONAL HERNIA REPAIR N/A 01/29/2013   Procedure: LAPAROSCOPIC INCISIONAL HERNIA;  Surgeon: Shelly Rubenstein, MD;  Location: [facility] ORS;  Service: General;  Laterality: N/A;   INSERTION OF MESH N/A 01/29/2013   Procedure: INSERTION OF MESH;  Surgeon: Shelly Rubenstein, MD;  Location: [facility] ORS;  Service: General;  Laterality: N/A;   LAPAROSCOPY  03/21/2022   Procedure: DIAGNOSTIC LAPAROSCOPY;  Surgeon: Steva Ready, DO;  Location: [facility] OR;  Service: Gynecology;;   LEFT HEART CATHETERIZATION WITH CORONARY ANGIOGRAM N/A 07/15/2012   Procedure: LEFT HEART CATHETERIZATION WITH CORONARY ANGIOGRAM;  Surgeon: Marykay Lex, MD;  Location: [facility] CATH LAB: pRCA 80-90%, OM2 BMS ~10% ISR   NM MYOVIEW LTD   08/2014    Normal LV size and function. Normal wall motion. LOW RISK.   PERCUTANEOUS CORONARY STENT INTERVENTION (PCI-S)  07/15/2012   Procedure: PERCUTANEOUS CORONARY STENT INTERVENTION (PCI-S);  Surgeon: Marykay Lex, MD;  Location: [facility] CATH LAB: c) PCI to proximal RCA with Promus Premier DES 2.75 mm x 20 mm (3.0 mm)   REPLACEMENT TOTAL KNEE Left 2003   TOTAL HIP ARTHROPLASTY  09/08/2007   left, Dr. Lequita Halt   TOTAL HIP ARTHROPLASTY Right 2003   TRANSTHORACIC ECHOCARDIOGRAM  08/2012   Normal LV size & function.  EF 55-60%, no regional WMA, Gr 1 DD, mild MR, mild-mod TR; aortic sclerosis without stenosis     A IV Location/Drains/Wounds Patient Lines/Drains/Airways Status     Active Line/Drains/Airways     Name Placement date Placement time Site Days   Peripheral IV 11/22/22 18 G 1" Left Antecubital  11/22/22  --  Antecubital  less than 1   Incision - 3 Ports Abdomen 1: Mid;Upper 2: Left;Lateral;Lower 3: Left;Lateral;Upper 03/21/22  1106  -- 246            Intake/Output Last 24 hours No intake or output data in the 24 hours ending 11/22/22 1733  Labs/Imaging Results for orders placed or performed during the hospital encounter of 11/22/22 (from the past 48 hour(s))  Comprehensive metabolic panel     Status: Abnormal   Collection Time: 11/22/22 12:48 PM  Result Value Ref Range   Sodium 138 135 - 145 mmol/L   Potassium 3.6 3.5 - 5.1 mmol/L   Chloride 102 98 - 111 mmol/L   CO2 24 22 - 32 mmol/L   Glucose, Bld 135 (H) 70 - 99 mg/dL    Comment: Glucose reference range applies only to samples taken after fasting for at least 8 hours.   BUN 19 8 - 23 mg/dL   Creatinine, Ser 2.13 (H) 0.44 - 1.00 mg/dL   Calcium 9.1 8.9 - 08.6 mg/dL   Total Protein 6.4 (L) 6.5 - 8.1 g/dL   Albumin 3.5 3.5 - 5.0 g/dL   AST 27 15 - 41 U/L   ALT 14 0 - 44 U/L   Alkaline Phosphatase 50 38 - 126 U/L   Total Bilirubin 1.4 (H) 0.3 - 1.2 mg/dL   GFR, Estimated 48 (L) >60 mL/min    Comment:  (NOTE) Calculated using the CKD-EPI Creatinine Equation (2021)    Anion gap 12 5 - 15    Comment: Performed at [facility], [address], [place], [place] [address]  Troponin I (High Sensitivity)     Status: Abnormal   Collection Time: 11/22/22 12:48 PM  Result Value Ref Range   Troponin I (High Sensitivity) 28 (H) <18 ng/L    Comment: (NOTE) Elevated high sensitivity troponin I (hsTnI) values and significant  changes across serial measurements may suggest ACS but many other  chronic and acute conditions are known to elevate hsTnI results.  Refer to the "Links" section for chest pain algorithms and additional  guidance. Performed at [facility], [address], [place], [place] [address]   CBC with Differential     Status: Abnormal   Collection Time: 11/22/22 12:48 PM  Result Value Ref Range   WBC 7.1 4.0 - 10.5 K/uL   RBC 3.06 (L) 3.87 - 5.11 MIL/uL   Hemoglobin 8.6 (L) 12.0 - 15.0 g/dL   HCT 28.4 (L) 13.2 - 44.0 %   MCV 90.8 80.0 - 100.0 fL   MCH 28.1 26.0 - 34.0 pg   MCHC 30.9 30.0 - 36.0 g/dL   RDW 10.2 72.5 - 36.6 %   Platelets 208 150 - 400 K/uL   nRBC 0.0 0.0 - 0.2 %   Neutrophils Relative % 81 %   Neutro Abs 5.7 1.7 - 7.7 K/uL   Lymphocytes Relative 11 %   Lymphs Abs 0.8 0.7 - 4.0 K/uL   Monocytes Relative 6 %   Monocytes Absolute 0.4 0.1 - 1.0 K/uL   Eosinophils Relative 2 %   Eosinophils Absolute 0.2 0.0 - 0.5 K/uL   Basophils Relative 0 %   Basophils Absolute 0.0 0.0 - 0.1 K/uL   Immature Granulocytes 0 %   Abs Immature Granulocytes 0.03 0.00 - 0.07 K/uL    Comment: Performed at [facility], [address], [place], [place] [address]  Brain natriuretic peptide  Status: Abnormal   Collection Time: 11/22/22 12:48 PM  Result Value Ref Range   B Natriuretic Peptide 632.1 (H) 0.0 - 100.0 pg/mL    Comment: Performed at [facility], [address] [place], [place] [address]  Troponin I (High Sensitivity)     Status: Abnormal    Collection Time: 11/22/22  4:06 PM  Result Value Ref Range   Troponin I (High Sensitivity) 33 (H) <18 ng/L    Comment: (NOTE) Elevated high sensitivity troponin I (hsTnI) values and significant  changes across serial measurements may suggest ACS but many other  chronic and acute conditions are known to elevate hsTnI results.  Refer to the "Links" section for chest pain algorithms and additional  guidance. Performed at [facility], [address], [place], [place] [address]    DG Chest Portable 1 View  Result Date: 11/22/2022 CLINICAL DATA:  Dyspnea.  Shortness of breath.  Extremity swelling. EXAM: PORTABLE CHEST 1 VIEW COMPARISON:  Chest radiographs 11/03/2022 and 08/06/2022 FINDINGS: Cardiac silhouette is again mildly enlarged. Mild-to-moderate calcification within aortic arch. Mildly decreased lung volumes. Mild bilateral chronic interstitial thickening is mildly improved from 11/03/2022 and 08/06/2022 radiographs. This appears unchanged from 07/27/2022. No focal airspace opacity. No pleural effusion pneumothorax. No acute skeletal abnormality. Severe bilateral glenohumeral osteoarthritis. IMPRESSION: 1. No active disease. 2. Mild chronic interstitial thickening, mildly improved from 11/03/2022 and 08/06/2022. Electronically Signed   By: Neita Garnet M.D.   On: 11/22/2022 13:05    Pending Labs Unresulted Labs (From admission, onward)     Start     Ordered   11/23/22 0500  Comprehensive metabolic panel  Tomorrow morning,   R        11/22/22 1728   11/23/22 0500  CBC  Tomorrow morning,   R        11/22/22 1728   11/22/22 1728  Magnesium  Add-on,   AD        11/22/22 1728            Vitals/Pain Today's Vitals   11/22/22 1345 11/22/22 1445 11/22/22 1530 11/22/22 1614  BP: 132/71 127/79 (!) 148/84   Pulse: (!) 57 62 68   Resp: 18 20 (!) 23   Temp:    97.7 F (36.5 C)  SpO2: 100% 100% 100%   PainSc:        Isolation Precautions No active  isolations  Medications Medications  furosemide (LASIX) injection 40 mg (has no administration in time range)  diltiazem (CARDIZEM CD) 24 hr capsule 300 mg (has no administration in time range)  metoprolol tartrate (LOPRESSOR) tablet 25 mg (has no administration in time range)  escitalopram (LEXAPRO) tablet 5 mg (has no administration in time range)  apixaban (ELIQUIS) tablet 2.5 mg (has no administration in time range)  Melatonin CHEW 5 mg (has no administration in time range)  albuterol (VENTOLIN HFA) 108 (90 Base) MCG/ACT inhaler 2 puff (has no administration in time range)  sodium chloride flush (NS) 0.9 % injection 3 mL (has no administration in time range)  furosemide (LASIX) injection 60 mg (has no administration in time range)  acetaminophen (TYLENOL) tablet 650 mg (has no administration in time range)    Or  acetaminophen (TYLENOL) suppository 650 mg (has no administration in time range)  polyethylene glycol (MIRALAX / GLYCOLAX) packet 17 g (has no administration in time range)  acetaminophen (TYLENOL) tablet 1,000 mg (1,000 mg Oral Given 11/22/22 1447)    Mobility walks with device  Focused Assessments Pulmonary Assessment Handoff:  Lung sounds:          R Recommendations: See Admitting Provider Note  Report given to:   Additional Notes: Pt is AOX4, walky ( with assist/walker at home) talky, continent, wears 2L at baseline, BNP is elevated, patient is HOH, but able to verbalize needs

## 2022-11-22 NOTE — Plan of Care (Signed)

## 2022-11-23 ENCOUNTER — Observation Stay (HOSPITAL_BASED_OUTPATIENT_CLINIC_OR_DEPARTMENT_OTHER): Payer: Medicare Other

## 2022-11-23 DIAGNOSIS — I5033 Acute on chronic diastolic (congestive) heart failure: Secondary | ICD-10-CM | POA: Diagnosis not present

## 2022-11-23 DIAGNOSIS — I480 Paroxysmal atrial fibrillation: Secondary | ICD-10-CM | POA: Diagnosis not present

## 2022-11-23 LAB — COMPREHENSIVE METABOLIC PANEL
ALT: 14 U/L (ref 0–44)
AST: 18 U/L (ref 15–41)
Albumin: 3.2 g/dL — ABNORMAL LOW (ref 3.5–5.0)
Alkaline Phosphatase: 50 U/L (ref 38–126)
Anion gap: 10 (ref 5–15)
BUN: 14 mg/dL (ref 8–23)
CO2: 28 mmol/L (ref 22–32)
Calcium: 8.8 mg/dL — ABNORMAL LOW (ref 8.9–10.3)
Chloride: 101 mmol/L (ref 98–111)
Creatinine, Ser: 0.97 mg/dL (ref 0.44–1.00)
GFR, Estimated: 54 mL/min — ABNORMAL LOW (ref >60)
Glucose, Bld: 125 mg/dL — ABNORMAL HIGH (ref 70–99)
Potassium: 2.6 mmol/L — CL (ref 3.5–5.1)
Sodium: 139 mmol/L (ref 135–145)
Total Bilirubin: 1.3 mg/dL — ABNORMAL HIGH (ref 0.3–1.2)
Total Protein: 6.1 g/dL — ABNORMAL LOW (ref 6.5–8.1)

## 2022-11-23 LAB — IRON AND TIBC
Iron: 31 ug/dL (ref 28–170)
Saturation Ratios: 6 % — ABNORMAL LOW (ref 10.4–31.8)
TIBC: 498 ug/dL — ABNORMAL HIGH (ref 250–450)
UIBC: 467 ug/dL

## 2022-11-23 LAB — ECHOCARDIOGRAM COMPLETE
Calc EF: 54.6 %
Height: 59 in
S' Lateral: 2.7 cm
Single Plane A2C EF: 51.6 %
Single Plane A4C EF: 56.4 %
Weight: 2112.89 oz

## 2022-11-23 LAB — MAGNESIUM: Magnesium: 2.1 mg/dL (ref 1.7–2.4)

## 2022-11-23 LAB — VITAMIN B12: Vitamin B-12: 292 pg/mL (ref 180–914)

## 2022-11-23 LAB — CBC
HCT: 27.4 % — ABNORMAL LOW (ref 36.0–46.0)
Hemoglobin: 8.6 g/dL — ABNORMAL LOW (ref 12.0–15.0)
MCH: 28.2 pg (ref 26.0–34.0)
MCHC: 31.4 g/dL (ref 30.0–36.0)
MCV: 89.8 fL (ref 80.0–100.0)
Platelets: 197 10*3/uL (ref 150–400)
RBC: 3.05 MIL/uL — ABNORMAL LOW (ref 3.87–5.11)
RDW: 14.3 % (ref 11.5–15.5)
WBC: 6.9 10*3/uL (ref 4.0–10.5)
nRBC: 0 % (ref 0.0–0.2)

## 2022-11-23 LAB — RETICULOCYTES
Immature Retic Fract: 7.9 % (ref 2.3–15.9)
RBC.: 3.4 MIL/uL — ABNORMAL LOW (ref 3.87–5.11)
Retic Count, Absolute: 61.5 10*3/uL (ref 19.0–186.0)
Retic Ct Pct: 1.8 % (ref 0.4–3.1)

## 2022-11-23 LAB — FOLATE: Folate: 11.2 ng/mL (ref >5.9)

## 2022-11-23 LAB — FERRITIN: Ferritin: 17 ng/mL (ref 11–307)

## 2022-11-23 MED ORDER — FUROSEMIDE 40 MG PO TABS
40.0000 mg | ORAL_TABLET | Freq: Every day | ORAL | Status: DC
  Administered 2022-11-24: 40 mg via ORAL
  Filled 2022-11-23: qty 1

## 2022-11-23 MED ORDER — MAGNESIUM SULFATE 2 GM/50ML IV SOLN
2.0000 g | Freq: Once | INTRAVENOUS | Status: AC
  Administered 2022-11-23: 2 g via INTRAVENOUS
  Filled 2022-11-23: qty 50

## 2022-11-23 MED ORDER — FUROSEMIDE 10 MG/ML IJ SOLN
40.0000 mg | Freq: Once | INTRAMUSCULAR | Status: AC
  Administered 2022-11-23: 40 mg via INTRAVENOUS
  Filled 2022-11-23: qty 4

## 2022-11-23 MED ORDER — POTASSIUM CHLORIDE CRYS ER 20 MEQ PO TBCR
40.0000 meq | EXTENDED_RELEASE_TABLET | ORAL | Status: AC
  Administered 2022-11-23 (×3): 40 meq via ORAL
  Filled 2022-11-23: qty 4
  Filled 2022-11-23 (×2): qty 2

## 2022-11-23 MED ORDER — MELATONIN 5 MG PO TABS: 10.0000 mg | ORAL_TABLET | Freq: Every day | ORAL | Status: DC

## 2022-11-23 MED ORDER — MELATONIN 5 MG PO TABS
5.0000 mg | ORAL_TABLET | Freq: Once | ORAL | Status: AC
  Administered 2022-11-23: 5 mg via ORAL
  Filled 2022-11-23: qty 1

## 2022-11-23 MED ORDER — SPIRONOLACTONE 12.5 MG HALF TABLET: 12.5000 mg | ORAL_TABLET | Freq: Every day | ORAL | Status: DC

## 2022-11-23 MED ORDER — ALPRAZOLAM 0.25 MG PO TABS
0.2500 mg | ORAL_TABLET | Freq: Once | ORAL | Status: AC
  Administered 2022-11-23: 0.25 mg via ORAL
  Filled 2022-11-23: qty 1

## 2022-11-23 NOTE — Progress Notes (Addendum)
PROGRESS NOTE    [name]  ZOX:[id number] DOB: 1927/12/29 DOA: 11/22/2022 PCP: Thana Ates, MD  89/F with history of CAD, paroxysmal A-fib, diastolic CHF, hypertension, dyslipidemia, chronic respiratory failure on 2 L home O2, prior history of colon cancer presented to the ED with ongoing dyspnea on exertion and some lower extremity edema.  Sent to the ED from PCPs office on account of shortness of breath.  Compliant with Lasix, tries to follow a low-salt diet Workup in the ED noted stable vital signs, creatinine 1.0, BNP 632, chest x-ray with chronic interstitial thickening  Subjective: -Still reports some shortness of breath  Assessment and Plan:  Dyspnea on exertion Acute on chronic diastolic CHF Moderate mitral regurgitation -Last echo 1/24 with preserved EF, grade 2 diastolic dysfunction, normal RV and moderate mitral regurg -Fourth admission this year -Will request cardiology input -Does not appear volume overloaded to me this morning -Hold additional IV Lasix pending cards eval, continue metoprolol -Will repeat echo, ?  worsening MR   Atrial fibrillation > During recent admission and amiodarone was held due to symptomatic bradycardia.  Metoprolol and diltiazem were continued. - Continue metoprolol, diltiazem, Eliquis   CAD status post multiple stenting -Stable, continue metoprolol, Eliquis   Asthma Chronic respiratory failure with hypoxia > On chronic 2 L.,  Stable no wheezing - Continue supplemental oxygen - Continue as needed albuterol  Chronic anemia -check anemia panel   Obesity History of colon cancer - Noted   DVT prophylaxis:      Eliquis Code Status:             DNR, discussed CODE STATUS with the patient today she wishes to have a natural death Family Communication: No family at bedside Disposition Plan: Home likely 1 to 2 days  Consultants:    Procedures:   Antimicrobials:    Objective: Vitals:   11/23/22 0020 11/23/22 0424 11/23/22  0600 11/23/22 0716  BP: 121/68 (!) 153/76  (!) 141/67  Pulse: 77 77  97  Resp: 18 18  17   Temp: (!) 97 F (36.1 C) (!) 97.4 F (36.3 C)  (!) 97.4 F (36.3 C)  TempSrc: Oral Oral  Oral  SpO2: 90% 99% 100% 100%  Weight:  59.9 kg    Height:        Intake/Output Summary (Last 24 hours) at 11/23/2022 1003 Last data filed at 11/23/2022 0805 Gross per 24 hour  Intake 220 ml  Output 1030 ml  Net -810 ml   Filed Weights   11/22/22 2300 11/23/22 0424  Weight: 59.9 kg 59.9 kg    Examination:  General exam: Appears calm and comfortable very hard of hearing Respiratory system: Decreased breath sounds at the bases Cardiovascular system: S1 & S2 heard, irregular rhythm, systolic murmur Abd: nondistended, soft and nontender.Normal bowel sounds heard. Central nervous system: Alert and oriented. No focal neurological deficits. Extremities: no edema Skin: No rashes Psychiatry:  Mood & affect appropriate.     Data Reviewed:   CBC: Recent Labs  Lab 11/22/22 1248 11/23/22 0054  WBC 7.1 6.9  NEUTROABS 5.7  --   HGB 8.6* 8.6*  HCT 27.8* 27.4*  MCV 90.8 89.8  PLT 208 197   Basic Metabolic Panel: Recent Labs  Lab 11/22/22 1248 11/22/22 2048 11/23/22 0054  NA 138  --  139  K 3.6  --  2.6*  CL 102  --  101  CO2 24  --  28  GLUCOSE 135*  --  125*  BUN  19  --  14  CREATININE 1.08*  --  0.97  CALCIUM 9.1  --  8.8*  MG  --  1.5*  --    GFR: Estimated Creatinine Clearance: 28.5 mL/min (by C-G formula based on SCr of 0.97 mg/dL). Liver Function Tests: Recent Labs  Lab 11/22/22 1248 11/23/22 0054  AST 27 18  ALT 14 14  ALKPHOS 50 50  BILITOT 1.4* 1.3*  PROT 6.4* 6.1*  ALBUMIN 3.5 3.2*   No results for input(s): "LIPASE", "AMYLASE" in the last 168 hours. No results for input(s): "AMMONIA" in the last 168 hours. Coagulation Profile: No results for input(s): "INR", "PROTIME" in the last 168 hours. Cardiac Enzymes: No results for input(s): "CKTOTAL", "CKMB",  "CKMBINDEX", "TROPONINI" in the last 168 hours. BNP (last 3 results) No results for input(s): "PROBNP" in the last 8760 hours. HbA1C: No results for input(s): "HGBA1C" in the last 72 hours. CBG: No results for input(s): "GLUCAP" in the last 168 hours. Lipid Profile: No results for input(s): "CHOL", "HDL", "LDLCALC", "TRIG", "CHOLHDL", "LDLDIRECT" in the last 72 hours. Thyroid Function Tests: No results for input(s): "TSH", "T4TOTAL", "FREET4", "T3FREE", "THYROIDAB" in the last 72 hours. Anemia Panel: No results for input(s): "VITAMINB12", "FOLATE", "FERRITIN", "TIBC", "IRON", "RETICCTPCT" in the last 72 hours. Urine analysis:    Component Value Date/Time   COLORURINE YELLOW 09/15/2016 2104   APPEARANCEUR CLEAR 09/15/2016 2104   LABSPEC 1.017 09/15/2016 2104   PHURINE 5.0 09/15/2016 2104   GLUCOSEU NEGATIVE 09/15/2016 2104   HGBUR NEGATIVE 09/15/2016 2104   BILIRUBINUR NEGATIVE 09/15/2016 2104   KETONESUR NEGATIVE 09/15/2016 2104   PROTEINUR NEGATIVE 09/15/2016 2104   UROBILINOGEN 0.2 10/22/2012 1726   NITRITE NEGATIVE 09/15/2016 2104   LEUKOCYTESUR SMALL (A) 09/15/2016 2104   Sepsis Labs: @LABRCNTIP (procalcitonin:4,lacticidven:4)  )No results found for this or any previous visit (from the past 240 hour(s)).   Radiology Studies: DG Chest Portable 1 View  Result Date: 11/22/2022 CLINICAL DATA:  Dyspnea.  Shortness of breath.  Extremity swelling. EXAM: PORTABLE CHEST 1 VIEW COMPARISON:  Chest radiographs 11/03/2022 and 08/06/2022 FINDINGS: Cardiac silhouette is again mildly enlarged. Mild-to-moderate calcification within aortic arch. Mildly decreased lung volumes. Mild bilateral chronic interstitial thickening is mildly improved from 11/03/2022 and 08/06/2022 radiographs. This appears unchanged from 07/27/2022. No focal airspace opacity. No pleural effusion pneumothorax. No acute skeletal abnormality. Severe bilateral glenohumeral osteoarthritis. IMPRESSION: 1. No active disease. 2.  Mild chronic interstitial thickening, mildly improved from 11/03/2022 and 08/06/2022. Electronically Signed   By: Neita Garnet M.D.   On: 11/22/2022 13:05     Scheduled Meds:  apixaban  2.5 mg Oral BID   diltiazem  300 mg Oral Daily   escitalopram  5 mg Oral Daily   furosemide  40 mg Intravenous Once   melatonin  5 mg Oral QHS   metoprolol tartrate  25 mg Oral BID   potassium chloride  40 mEq Oral Q4H   sodium chloride flush  3 mL Intravenous Q12H   Continuous Infusions:   LOS: 0 days    Time spent:    Zannie Cove, MD [facility]   11/23/2022, 10:03 AM

## 2022-11-23 NOTE — Care Management Obs Status (Signed)
MEDICARE OBSERVATION STATUS NOTIFICATION   Patient Details  Name: [name] MRN: [id number] Date of Birth: February 17, 1928   Medicare Observation Status Notification Given:  Yes    Leone Haven, RN 11/23/2022, 3:18 PM

## 2022-11-23 NOTE — TOC Initial Note (Signed)
Transition of Care (TOC) - Initial/Assessment Note  From home alone, she has walker, rollator, home oxygen 2 liters with [employer], grab bars, walk in shower, 3 n 1. Patient gave this NCM permission to call daughter , [name] [phone] who is her Support system.  [name] will transport her home at dc.  She has PCP, Dr. Margaretann Loveless at [facility] with follow up on May 28th at 1200.  She goes to [employer] on [address] and [address].  She has had [facility] in the past and really liked them, if needs HH would like them again per daughter. NCM asked MD for [facility] and HHPT orders and NCM made referral to [facility] with [facility] for [facility] for CHF disease Management and HHPT.  He is able to take this referral. Soc will begin 24 to 48 hrs post dc.  Daughter is there every day to check on her and the son checks on her every evening.   Patient Details  Name: [name] MRN: [id number] Date of Birth: 02-19-1928  Transition of Care [facility]) CM/SW Contact:    Leone Haven, RN Phone Number: 11/23/2022, 3:32 PM  Clinical Narrative:                   Expected Discharge Plan: Home w Home Health Services Barriers to Discharge: Continued Medical Work up   Patient Goals and CMS Choice Patient states their goals for this hospitalization and ongoing recovery are:: return home CMS Medicare.gov Compare Post Acute Care list provided to:: Patient Represenative (must comment) Choice offered to / list presented to : Adult Children      Expected Discharge Plan and Services In-house Referral: NA Discharge Planning Services: CM Consult   Living arrangements for the past 2 months: Single Family Home                 DME Arranged: N/A DME Agency: NA       HH Arranged: RN, Disease Management, PT HH Agency: [facility] Date [facility] Agency Contacted: 11/23/22 Time HH Agency Contacted: [id number] Representative spoke with at [facility] Agency: Kandee Keen  Prior Living Arrangements/Services Living arrangements  for the past 2 months: Single Family Home Lives with:: Self Patient language and need for interpreter reviewed:: Yes Do you feel safe going back to the place where you live?: Yes      Need for Family Participation in Patient Care: Yes (Comment) Care giver support system in place?: Yes (comment) Current home services: DME (walker, home oxyten with [employer] 2 liters, rollator, grab bars, walk in shower, 3 n 1,) Criminal Activity/Legal Involvement Pertinent to Current Situation/Hospitalization: No - Comment as needed  Activities of Daily Living Home Assistive Devices/Equipment: Walker (specify type) ADL Screening (condition at time of admission) Patient's cognitive ability adequate to safely complete daily activities?: Yes Is the patient deaf or have difficulty hearing?: No Does the patient have difficulty seeing, even when wearing glasses/contacts?: No Does the patient have difficulty concentrating, remembering, or making decisions?: No Patient able to express need for assistance with ADLs?: Yes Does the patient have difficulty dressing or bathing?: No Independently performs ADLs?: Yes (appropriate for developmental age) Does the patient have difficulty walking or climbing stairs?: Yes Weakness of Legs: Both Weakness of Arms/Hands: None  Permission Sought/Granted Permission sought to share information with : Family Supports Permission granted to share information with : Yes, Verbal Permission Granted  Share Information with NAME: daughter , [name]  Permission granted to share info w  AGENCY: yes  Permission granted to share info w Relationship: daughter  Permission granted to share info w Contact Information: yes  Emotional Assessment Appearance:: Appears stated age Attitude/Demeanor/Rapport: Engaged Affect (typically observed): Appropriate Orientation: : Oriented to Self, Oriented to Place, Oriented to  Time, Oriented to Situation Alcohol / Substance Use: Not Applicable Psych  Involvement: No (comment)  Admission diagnosis:  Acute on chronic diastolic CHF (congestive heart failure) (HCC) [I50.33] Acute on chronic congestive heart failure, unspecified heart failure type (HCC) [I50.9] Patient Active Problem List   Diagnosis Date Noted   Paroxysmal A-fib (HCC) 11/03/2022   Chronic diastolic CHF (congestive heart failure) (HCC) 11/03/2022   Chronic hypoxic respiratory failure (HCC) 11/03/2022   Hypercoagulable state due to paroxysmal atrial fibrillation (HCC) 10/22/2022   Acute respiratory failure with hypoxia (HCC) 08/06/2022   Acute on chronic diastolic CHF (congestive heart failure) (HCC) 08/06/2022   Atrial fibrillation with rapid ventricular response (HCC) 07/27/2022   Leukocytosis 09/15/2016   Change in bowel habits    History of colon cancer    Atypical chest pain 09/19/2014   Obesity (BMI 30-39.9) 03/14/2014   Medication intolerance - Plavix, Effient, Brilinta    Hyperlipidemia with target LDL less than 70    Essential hypertension    Diverticulosis of colon without hemorrhage 03/16/2013   Incisional hernia, without obstruction or gangrene 11/28/2012   Asthma 10/22/2012   AKI (acute kidney injury) (HCC) 10/22/2012   Carcinoma of ascending colon (HCC) 11/16/2011   PERSONAL HX COLONIC POLYPS 11/29/2009   S/P NSTEMI -- involving left circumflex coronary artery (OM2) In-stent Thrombosis 03/02/2001   CAD S/P PCI: OM2 PTCA 7/02, OM2 Stent 9/02, ISR 11/03 - PTCA; RCA DES 1 /14/14 12/31/2000   PCP:  Thana Ates, MD Pharmacy:   [facility] [phone] [place], [place] - [address] AT [facility] [address] & [facility] [address] [place] [address] Phone: [phone] Fax: [phone]  [facility] [address] [place] [address] Phone: [phone] Fax: [phone]     Social Determinants of Health (SDOH) Social History: SDOH Screenings   Food Insecurity: No Food Insecurity (11/03/2022)   Housing: Patient Declined (11/03/2022)  Transportation Needs: No Transportation Needs (11/03/2022)  Utilities: Not At Risk (11/03/2022)  Tobacco Use: Medium Risk (11/22/2022)   SDOH Interventions:     Readmission Risk Interventions    11/06/2022    1:10 PM  Readmission Risk Prevention Plan  Transportation Screening Complete  PCP or Specialist Appt within 3-5 Days Complete  HRI or Home Care Consult Complete  Palliative Care Screening Not Applicable  Medication Review (RN Care Manager) Complete

## 2022-11-23 NOTE — Consult Note (Addendum)
Cardiology Consultation   Patient ID: JALYLA FISCUS MRN: [id number]; DOB: 1928/06/23  Admit date: 11/22/2022 Date of Consult: 11/23/2022  PCP:  Thana Ates, MD   [facility] Providers Cardiologist:  Bryan Lemma, MD  Cardiology APP:  Marcelino Duster, [place]       Patient Profile:   [name] is a 89 y.o. female with a hx of CAD with multiple drug eluting stents, hypertension, hyperlipidemia, atrial fibrillation, HFpEF, colon cancer, anemia who is being seen 11/23/2022 for the evaluation of acute on chronic heart failure symptoms at the request of Dr. Joya Martyr.  History of Present Illness:   [name] is now admitted for the 4th time this year.  She presented to the emergency department yesterday from her PCP office with symptoms of shortness of breath and edema.  Patient reports compliance with home oral Lasix.  Work was notable for troponin of 28->33.  BNP elevated to 632.1.  CXR with mild chronic interstitial thickening.  On my exam today, patient reports chronic dyspnea. Despite diuresis, she doesn't feel that breathing is significantly improved today over yesterday. She indicates to me longstanding pulmonary issues, says she remembers having asthma and shortness of breath even as a child. She reports very occasional sensation of palpitations/irregular heartbeat sensation. Also reports chronic left shoulder pain that radiates into her chest but denies anginal symptoms.   Patient's cardiac history is notable for coronary artery disease with multiple peripheral interventions, drug-eluting stents, afib, and HFpEF.  Her last stress test/ischemic evaluation was on September 28, 2014, no ischemia noted on nuclear stress test.  In January of this year, patient was found to have atrial fibrillation with RVR and was hospitalized from 1/26 to 1/28 and again from 2/5 to 2/7. She was started on Eliquis 5 mg with diltiazem.  Patient was seen both in general cardiology in A-fib clinic in  February, was in normal sinus rhythm.  In A-fib clinic on 4/22, decision made to initiate amiodarone.  Patient started 200 mg twice a day x 2 weeks with maintenance dose of 200 mg daily after initial load.  Patient was readmitted from 5/4 to 5/7 with acute on chronic diastolic heart failure.  Amiodarone was held during this admission due to symptomatic bradycardia.  Patient was ultimately discharged on Lopressor 25 mg twice a day and diltiazem daily.  When patient was seen again at A-fib clinic follow-up on 5/13, she also noted that she had felt poorly since starting amiodarone.  At this A-fib clinic office visit, patient was in normal sinus rhythm.  Appears that patient's daughter was hesitant with rate control strategy only, and a cardiac monitor was placed for monitoring A-fib burden.   Past Medical History:  Diagnosis Date   Adenomatous colon polyp MAY 2013   HOSPITALIZED AT [facility] WITH HGB 7 -TRANSFUSED-MASS FOUND IN ASCENDING COLON   Aortic sclerosis    With murmur   Asthma    CAD S/P percutaneous coronary angioplasty 2002, 2003,2014   a) Unstable Angina 7/'02: 1st Cutting PTCA- OM2 --> restenosed 9/'02 NSTEMI--> 3.0 mm x 12 mm BMS-OM 2; b) 11/'03 DOE w/ + Cardiolite - Cutter PTCA for ISR -- patent in 2004 (after False + Cardiolite); c) 07/2012 - Unstable Angina -- PCI to proximal RCA with Promus Premier DES 2.75 mm x 20 mm (3.0 mm), patent OM2 stent ~10% ISR (HARDING)   Complication of anesthesia 2009   HIP REPLACEMENT-PT HAD HARD TIME WAKING UP--FELT LIKE SHE COULDN'T BREATHE  Coronary stent restenosis due to scar tissue 05/2002   Cutting Balloon PTCA to OM2 BMS   Diverticulosis 10/2012   Seen on Colonoscopy   DJD (degenerative joint disease), thoracolumbar    Back, Hips & Knees   Essential hypertension    H/O Colon cancer 10/2011   GI - Drs. Leone Payor - colonoscopy 10/2012: diverticulosis, stable ileocolic anastomosis of R colon   Hyperlipidemia with target LDL less than 70    Internal  hemorrhoids    Iron deficiency anemia due to chronic blood loss 10/2011   Blood Transfusion (EGD 09/2012 - unremarkable)   Medication intolerance - Plavix, Effient, Brilinta    Easy bruising, GI bleeds (led to Dx of Colon CA), Brilinta - dyspnea   Multiple lung nodules on CT 08/2012   scattered, bilateral but right>left.    Non-Q wave ST elevation myocardial infarction (STEMI) involving left circumflex coronary artery 03/2001   a) Severe thrombic ISR of prior PTCA site in OM2 --> BMS PCI; b) Echo 08/2012: Normal LV size & function.  EF 55-60%, no regional WMA, Gr 1 DD, mild MR, mild-mod TR - NO Pulmonary HTN   Osteoarthritis (arthritis due to wear and tear of joints)    Bilateral Hip Arthroplasty, L Knee TKA   Osteoporosis     Past Surgical History:  Procedure Laterality Date   APPENDECTOMY     CHOLECYSTECTOMY     COLONOSCOPY  11/16/2011   Procedure: COLONOSCOPY;  Surgeon: Iva Boop, MD;  Location: [facility] ENDOSCOPY;  Service: Endoscopy;  Laterality: N/A;   COLONOSCOPY  10/2012   Leone Payor: diverticulosis, stable R colon ileocolic anastomosis   COLONOSCOPY W/ BIOPSIES AND POLYPECTOMY  06/15/2008   adenomatous polyps, diverticulosis, internal hemorrhoids   COLONOSCOPY WITH PROPOFOL N/A 07/18/2015   Procedure: COLONOSCOPY WITH PROPOFOL;  Surgeon: Iva Boop, MD;  Location: [facility] ENDOSCOPY;  Service: Endoscopy;  Laterality: N/A;   CORONARY ANGIOPLASTY WITH STENT PLACEMENT  2002; 2003; 07/2012   a)  7/'02: 1st Cutting PTCA- OM2 --> restenosed 9/'02 --> 3.0 mm x 12 mm BMS-OM 2; b) 11/'03 - Cutter PTCA for ISR -- patent in 2004;    DILATATION & CURETTAGE/HYSTEROSCOPY WITH MYOSURE N/A 03/21/2022   Procedure: DILATATION & CURETTAGE/HYSTEROSCOPY WITH MYOSURE, PAP SMEAR;  Surgeon: Steva Ready, DO;  Location: [facility] OR;  Service: Gynecology;  Laterality: N/A;   ESOPHAGOGASTRODUODENOSCOPY N/A 10/23/2012   Procedure: ESOPHAGOGASTRODUODENOSCOPY (EGD);  Surgeon: Louis Meckel, MD;  Location: [facility] ENDOSCOPY;   Service: Endoscopy;  Laterality: N/A;   INCISIONAL HERNIA REPAIR N/A 01/29/2013   Procedure: LAPAROSCOPIC INCISIONAL HERNIA;  Surgeon: Shelly Rubenstein, MD;  Location: [facility] ORS;  Service: General;  Laterality: N/A;   INSERTION OF MESH N/A 01/29/2013   Procedure: INSERTION OF MESH;  Surgeon: Shelly Rubenstein, MD;  Location: [facility] ORS;  Service: General;  Laterality: N/A;   LAPAROSCOPY  03/21/2022   Procedure: DIAGNOSTIC LAPAROSCOPY;  Surgeon: Steva Ready, DO;  Location: [facility] OR;  Service: Gynecology;;   LEFT HEART CATHETERIZATION WITH CORONARY ANGIOGRAM N/A 07/15/2012   Procedure: LEFT HEART CATHETERIZATION WITH CORONARY ANGIOGRAM;  Surgeon: Marykay Lex, MD;  Location[facility] CATH LAB: pRCA 80-90%, OM2 BMS ~10% ISR   NM MYOVIEW LTD  08/2014    Normal LV size and function. Normal wall motion. LOW RISK.   PERCUTANEOUS CORONARY STENT INTERVENTION (PCI-S)  07/15/2012   Procedure: PERCUTANEOUS CORONARY STENT INTERVENTION (PCI-S);  Surgeon: Marykay Lex, MD;  Location: [facility] CATH LAB: c) PCI to proximal RCA with Promus Premier DES  2.75 mm x 20 mm (3.0 mm)   REPLACEMENT TOTAL KNEE Left 2003   TOTAL HIP ARTHROPLASTY  09/08/2007   left, Dr. Lequita Halt   TOTAL HIP ARTHROPLASTY Right 2003   TRANSTHORACIC ECHOCARDIOGRAM  08/2012   Normal LV size & function.  EF 55-60%, no regional WMA, Gr 1 DD, mild MR, mild-mod TR; aortic sclerosis without stenosis     Home Medications:  Prior to Admission medications   Medication Sig Start Date End Date Taking? Authorizing Provider  acetaminophen (TYLENOL) 500 MG tablet Take 500 mg by mouth in the morning and at bedtime. 02/24/16  Yes [provider]  albuterol (PROVENTIL HFA;VENTOLIN HFA) 108 (90 BASE) MCG/ACT inhaler Inhale 2 puffs into the lungs every 6 (six) hours as needed for wheezing.   Yes [provider]  apixaban (ELIQUIS) 2.5 MG TABS tablet Take 1 tablet (2.5 mg total) by mouth 2 (two) times daily. 11/06/22  Yes Arrien, York Ram, MD   cetirizine (ZYRTEC) 10 MG tablet Take 10 mg by mouth at bedtime as needed for allergies.   Yes [provider]  diltiazem (CARDIZEM CD) 300 MG 24 hr capsule Take 1 capsule (300 mg total) by mouth daily. 08/08/22 11/22/22 Yes Ghimire, Werner Lean, MD  escitalopram (LEXAPRO) 5 MG tablet Take 5 mg by mouth daily.   Yes [provider]  fluticasone (FLONASE) 50 MCG/ACT nasal spray Place 1 spray into the nose daily as needed for allergies.   Yes [provider]  furosemide (LASIX) 40 MG tablet Take 1 tablet (40 mg total) by mouth daily. 08/09/22  Yes Ghimire, Werner Lean, MD  Melatonin 5 MG CHEW Chew 5 mg by mouth at bedtime as needed (sleep).   Yes [provider]  metoprolol tartrate (LOPRESSOR) 25 MG tablet Take 25 mg by mouth 2 (two) times daily.   Yes [provider]  nitroGLYCERIN (NITROSTAT) 0.4 MG SL tablet Place 1 tablet (0.4 mg total) under the tongue every 5 (five) minutes as needed for chest pain. 08/10/22  Yes Jodelle Gross, NP    Inpatient Medications: Scheduled Meds:  apixaban  2.5 mg Oral BID   diltiazem  300 mg Oral Daily   escitalopram  5 mg Oral Daily   melatonin  5 mg Oral QHS   metoprolol tartrate  25 mg Oral BID   sodium chloride flush  3 mL Intravenous Q12H   Continuous Infusions:  PRN Meds: acetaminophen **OR** acetaminophen, albuterol, polyethylene glycol  Allergies:    Allergies  Allergen Reactions   Brilinta [Ticagrelor] Shortness Of Breath    Bleeding    Lipitor [Atorvastatin] Shortness Of Breath and Other (See Comments)    Myalgias    Plavix [Clopidogrel] Shortness Of Breath and Other (See Comments)    Bleeding    Codeine Nausea And Vomiting   Mobic [Meloxicam] Other (See Comments)    Stomach upset   Paxil [Paroxetine] Other (See Comments)    Stomach upset Myalgias  Chills    Sulfa Antibiotics Diarrhea and Other (See Comments)    Stomach upset   Sulfonamide Derivatives Diarrhea and Other (See Comments)     Stomach upset    Social History:   Social History   Socioeconomic History   Marital status: Married    Spouse name: Not on file   Number of children: 2   Years of education: Not on file   Highest education level: Not on file  Occupational History   Occupation: retired  Tobacco Use   Smoking status:  Former    Years: 30    Types: Cigarettes    Quit date: 07/02/1982    Years since quitting: 40.4   Smokeless tobacco: Never  Vaping Use   Vaping Use: Never used  Substance and Sexual Activity   Alcohol use: Yes    Alcohol/week: 0.0 standard drinks of alcohol    Comment: occ wine   Drug use: No   Sexual activity: Not on file  Other Topics Concern   Not on file  Social History Narrative   Married, Mother of 2 adopted children. She has 3 grandchildren.   She worked as a Agricultural consultant at [employer].   She quit smoking in 1984, and does not drink alcohol.   Social Determinants of Health   Financial Resource Strain: Not on file  Food Insecurity: No Food Insecurity (11/03/2022)   Hunger Vital Sign    Worried About Running Out of Food in the Last Year: Never true    Ran Out of Food in the Last Year: Never true  Transportation Needs: No Transportation Needs (11/03/2022)   PRAPARE - Administrator, Civil Service (Medical): No    Lack of Transportation (Non-Medical): No  Physical Activity: Not on file  Stress: Not on file  Social Connections: Not on file  Intimate Partner Violence: Not At Risk (11/03/2022)   Humiliation, Afraid, Rape, and Kick questionnaire    Fear of Current or Ex-Partner: No    Emotionally Abused: No    Physically Abused: No    Sexually Abused: No    Family History:    Family History  Problem Relation Age of Onset   Pancreatic cancer Father    Prostate cancer Father    Skin cancer Father    Heart disease Mother    Hypertension Mother    Pancreatic cancer Brother    Heart disease Sister    Colon cancer Brother        ? Colostomy   Prostate  cancer Brother    Colon cancer Sister    Melanoma Brother      ROS:  Please see the history of present illness.   All other ROS reviewed and negative.     Physical Exam/Data:   Vitals:   11/23/22 0424 11/23/22 0600 11/23/22 0716 11/23/22 1141  BP: (!) 153/76  (!) 141/67 (!) 151/65  Pulse: 77  97 91  Resp: 18  17 14   Temp: (!) 97.4 F (36.3 C)  (!) 97.4 F (36.3 C) (!) 97.5 F (36.4 C)  TempSrc: Oral  Oral Oral  SpO2: 99% 100% 100% 100%  Weight: 59.9 kg     Height:        Intake/Output Summary (Last 24 hours) at 11/23/2022 1357 Last data filed at 11/23/2022 0805 Gross per 24 hour  Intake 220 ml  Output 1030 ml  Net -810 ml      11/23/2022    4:24 AM 11/22/2022   11:00 PM 11/12/2022    3:37 PM  Last 3 Weights  Weight (lbs) 132 lb 0.9 oz 132 lb 0.9 oz 142 lb 12.8 oz  Weight (kg) 59.9 kg 59.9 kg 64.774 kg     Body mass index is 26.67 kg/m.  General:  Well nourished, well developed, in no acute distress HEENT: normal Neck: no JVD Vascular: No carotid bruits; Distal pulses 2+ bilaterally Cardiac:  normal S1, S2; slightly irregular with PACs (telemetry observed with auscultation); no murmur  Lungs:  upper lobe end-inspiratory wheezing. No basilar crackles.  Abd: soft, nontender, no hepatomegaly  Ext: no edema Musculoskeletal:  No deformities, BUE and BLE strength normal and equal Skin: warm and dry  Neuro:  CNs 2-12 intact, no focal abnormalities noted Psych:  Normal affect   EKG:  The EKG was personally reviewed and demonstrates:  I am unable to find an ECG for this admission. Order placed Telemetry:  Telemetry was personally reviewed and demonstrates:  sinus rhythm. Occasional early beats that appear to be PAC. Isolated PVCs. There is significant baseline artifact that makes interpretation challenging but I do not see any clear atrial fibrillation this admission.  Relevant CV Studies:  07/29/22 TTE  IMPRESSIONS     1. Left ventricular ejection fraction, by  estimation, is 60 to 65%. The  left ventricle has normal function. The left ventricle has no regional  wall motion abnormalities. Left ventricular diastolic parameters are  consistent with Grade II diastolic  dysfunction (pseudonormalization). Elevated left atrial pressure.   2. Right ventricular systolic function is normal. The right ventricular  size is normal.   3. The mitral valve is normal in structure. Moderate mitral valve  regurgitation. No evidence of mitral stenosis. Moderate mitral annular  calcification.   4. The aortic valve is tricuspid. There is mild thickening of the aortic  valve. Aortic valve regurgitation is not visualized. Aortic valve  sclerosis is present, with no evidence of aortic valve stenosis.   5. The inferior vena cava is normal in size with greater than 50%  respiratory variability, suggesting right atrial pressure of 3 mmHg.   Comparison(s): During the study the rhythm is not atrial fibrillation, but  sinus rhythm with very frequent PACs and frequent brief episodes of atrial  tachycardia.   FINDINGS   Left Ventricle: Left ventricular ejection fraction, by estimation, is 60  to 65%. The left ventricle has normal function. The left ventricle has no  regional wall motion abnormalities. The left ventricular internal cavity  size was normal in size. There is   borderline concentric left ventricular hypertrophy. Left ventricular  diastolic parameters are consistent with Grade II diastolic dysfunction  (pseudonormalization). Elevated left atrial pressure.   Right Ventricle: The right ventricular size is normal. No increase in  right ventricular wall thickness. Right ventricular systolic function is  normal.   Left Atrium: Left atrial size was normal in size.   Right Atrium: Right atrial size was normal in size.   Pericardium: There is no evidence of pericardial effusion.   Mitral Valve: The mitral valve is normal in structure. Moderate mitral  annular  calcification. Moderate mitral valve regurgitation, with  centrally-directed jet. No evidence of mitral valve stenosis.   Tricuspid Valve: The tricuspid valve is normal in structure. Tricuspid  valve regurgitation is not demonstrated. No evidence of tricuspid  stenosis.   Aortic Valve: The aortic valve is tricuspid. There is mild thickening of  the aortic valve. Aortic valve regurgitation is not visualized. Aortic  valve sclerosis is present, with no evidence of aortic valve stenosis.   Pulmonic Valve: The pulmonic valve was normal in structure. Pulmonic valve  regurgitation is mild. No evidence of pulmonic stenosis.   Aorta: The aortic root is normal in size and structure.   Venous: The inferior vena cava is normal in size with greater than 50%  respiratory variability, suggesting right atrial pressure of 3 mmHg.   IAS/Shunts: No atrial level shunt detected by color flow Doppler.   Laboratory Data:  High Sensitivity Troponin:   Recent Labs  Lab 11/03/22  1728 11/03/22 1928 11/22/22 1248 11/22/22 1606  TROPONINIHS 29* 27* 28* 33*     Chemistry Recent Labs  Lab 11/22/22 1248 11/22/22 2048 11/23/22 0054  NA 138  --  139  K 3.6  --  2.6*  CL 102  --  101  CO2 24  --  28  GLUCOSE 135*  --  125*  BUN 19  --  14  CREATININE 1.08*  --  0.97  CALCIUM 9.1  --  8.8*  MG  --  1.5*  --   GFRNONAA 48*  --  54*  ANIONGAP 12  --  10    Recent Labs  Lab 11/22/22 1248 11/23/22 0054  PROT 6.4* 6.1*  ALBUMIN 3.5 3.2*  AST 27 18  ALT 14 14  ALKPHOS 50 50  BILITOT 1.4* 1.3*   Lipids No results for input(s): "CHOL", "TRIG", "HDL", "LABVLDL", "LDLCALC", "CHOLHDL" in the last 168 hours.  Hematology Recent Labs  Lab 11/22/22 1248 11/23/22 0054  WBC 7.1 6.9  RBC 3.06* 3.05*  HGB 8.6* 8.6*  HCT 27.8* 27.4*  MCV 90.8 89.8  MCH 28.1 28.2  MCHC 30.9 31.4  RDW 14.4 14.3  PLT 208 197   Thyroid No results for input(s): "TSH", "FREET4" in the last 168 hours.  BNP Recent  Labs  Lab 11/22/22 1248  BNP 632.1*    DDimer No results for input(s): "DDIMER" in the last 168 hours.   Radiology/Studies:  DG Chest Portable 1 View  Result Date: 11/22/2022 CLINICAL DATA:  Dyspnea.  Shortness of breath.  Extremity swelling. EXAM: PORTABLE CHEST 1 VIEW COMPARISON:  Chest radiographs 11/03/2022 and 08/06/2022 FINDINGS: Cardiac silhouette is again mildly enlarged. Mild-to-moderate calcification within aortic arch. Mildly decreased lung volumes. Mild bilateral chronic interstitial thickening is mildly improved from 11/03/2022 and 08/06/2022 radiographs. This appears unchanged from 07/27/2022. No focal airspace opacity. No pleural effusion pneumothorax. No acute skeletal abnormality. Severe bilateral glenohumeral osteoarthritis. IMPRESSION: 1. No active disease. 2. Mild chronic interstitial thickening, mildly improved from 11/03/2022 and 08/06/2022. Electronically Signed   By: Neita Garnet M.D.   On: 11/22/2022 13:05     Assessment and Plan:   Acute on Chronic HFpEF Moderately elevated pulmonary artery pressure Hypertension  Patient with second admission in the last month for dyspnea.This is despite compliance with home lasix. Patient received IV lasix x2 and has reported net output of . LVEF remains preserved at 55-60%. Per same echo, the right ventricular size is moderately enlarged. There is moderately elevated pulmonary artery  systolic pressure. The estimated right ventricular systolic pressure is  56.4 mmHg.   Clinically patient appears to be euvolemic. This is supported by RA pressure estimate on echo today.  Resume home oral lasix, 40mg  tomorrow Could consider adding low dose Spironolactone (12.5mg  based on eGFR) for both volume and BP.  Replace K to maintain levels >4. Replace magnesium to maintain levels >2.  Patient did have some wheezing on physical exam and given asthma history, would consider CT chest to further evaluate non-cardiac etiology of  acute on chronic dyspnea.  Paroxysmal atrial fibrillation  First diagnosed in January of this year, prompting 2 admission (one at the end of January, second beginning of February). She was initially rate controlled, though was also briefly on Amiodarone end of April-early May. This was discontinued due to bradycardia. Patient currently on Diltiazem and metoprolol. Telemetry was personally reviewed this admission and shows sinus rhythm. Occasional early beats that appear to be PAC. Isolated PVCs. There is  significant baseline artifact that makes interpretation challenging but I do not see any clear atrial fibrillation this admission.  Patient does not appear to have significant symptoms as a result of atrial fibrillation and I would favor continued rate control strategy. AAD extremely challenging secondary to comorbidities. Re-trial of Amiodarone would be risky given chronic dyspnea/pulmonary concerns. CAD prevents use of Flecainide. CKD likely precludes Tikosyn. Overall that AAD risks would significantly outweigh potential benefit. Continue Cardizem CD 300mg  QD, Metoprolol Tartrate 25mg  (consider consolidation). Continue Eliquis 2.5mg  BID  CAD  Patient with history of CAD with multiple drug eluting stents placed. Nuclear stress test in March of 2016 without ischemia. Patient denies chest pain.  Troponin 28->33 this admission. This is consistent with patient's chronic elevation.  ECG pending. No ASA with chronic Eliquis.     Risk Assessment/Risk Scores:        New York Heart Association (NYHA) Functional Class NYHA Class II  CHA2DS2-VASc Score = 5   This indicates a 7.2% annual risk of stroke. The patient's score is based upon: CHF History: 0 HTN History: 1 Diabetes History: 0 Stroke History: 0 Vascular Disease History: 1 Age Score: 2 Gender Score: 1         For questions or updates, please contact [facility] Please consult [email or web address] for contact info under     Signed, Perlie Gold, PA-C  11/23/2022 1:57 PM  Agree with note by Perlie Gold, PA-C  89 year old frail appearing Caucasian female patient of Dr. Elissa Hefty admitted with heart failure with preserved EF.  She does not have a history of chronic lung disease on home O2.  She has a history of CAD status post remote multiple drug-eluting stents.  She also has a history of heart failure with preserved EF with grade 2 diastolic dysfunction as well as PAF.  She is on reduced dose Eliquis as well as furosemide at home.  She was admitted with question volume overload.  Her BNP was in the 600 range.  She has been mildly diuresed.  Renal function has remained stable.  She is in sinus rhythm today.  She has been started on amiodarone in the past which she did not tolerate and was discontinued.  I do not think she is a candidate for other antiarrhythmic agents given her age and comorbidities.  She does have occasional wheezes on exam but appears euvolemic.  At this point, I do not think I would change any medications.  Replete potassium and magnesium.  Expect discharge in the near future.  Will arrange follow-up with Dr. Herbie Baltimore.  Runell Gess, M.D., FACP, [facility], [facility] [address] [place], [place]  [address]  [phone] 11/23/2022 4:36 PM

## 2022-11-24 DIAGNOSIS — I480 Paroxysmal atrial fibrillation: Secondary | ICD-10-CM | POA: Diagnosis not present

## 2022-11-24 DIAGNOSIS — Z515 Encounter for palliative care: Secondary | ICD-10-CM | POA: Diagnosis not present

## 2022-11-24 DIAGNOSIS — I2721 Secondary pulmonary arterial hypertension: Secondary | ICD-10-CM

## 2022-11-24 DIAGNOSIS — I5033 Acute on chronic diastolic (congestive) heart failure: Secondary | ICD-10-CM | POA: Diagnosis not present

## 2022-11-24 DIAGNOSIS — Z7189 Other specified counseling: Secondary | ICD-10-CM

## 2022-11-24 DIAGNOSIS — J9621 Acute and chronic respiratory failure with hypoxia: Secondary | ICD-10-CM

## 2022-11-24 LAB — CBC
HCT: 29.5 % — ABNORMAL LOW (ref 36.0–46.0)
Hemoglobin: 9.2 g/dL — ABNORMAL LOW (ref 12.0–15.0)
MCH: 28.7 pg (ref 26.0–34.0)
MCHC: 31.2 g/dL (ref 30.0–36.0)
MCV: 91.9 fL (ref 80.0–100.0)
Platelets: 243 10*3/uL (ref 150–400)
RBC: 3.21 MIL/uL — ABNORMAL LOW (ref 3.87–5.11)
RDW: 14.4 % (ref 11.5–15.5)
WBC: 8.7 10*3/uL (ref 4.0–10.5)
nRBC: 0 % (ref 0.0–0.2)

## 2022-11-24 LAB — BASIC METABOLIC PANEL
Anion gap: 9 (ref 5–15)
BUN: 15 mg/dL (ref 8–23)
CO2: 28 mmol/L (ref 22–32)
Calcium: 9.4 mg/dL (ref 8.9–10.3)
Chloride: 101 mmol/L (ref 98–111)
Creatinine, Ser: 1.09 mg/dL — ABNORMAL HIGH (ref 0.44–1.00)
GFR, Estimated: 47 mL/min — ABNORMAL LOW (ref >60)
Glucose, Bld: 138 mg/dL — ABNORMAL HIGH (ref 70–99)
Potassium: 3.8 mmol/L (ref 3.5–5.1)
Sodium: 138 mmol/L (ref 135–145)

## 2022-11-24 MED ORDER — FERROUS SULFATE 325 (65 FE) MG PO TBEC: 325.0000 mg | DELAYED_RELEASE_TABLET | Freq: Every day | ORAL | 0 refills | Status: AC

## 2022-11-24 MED ORDER — SPIRONOLACTONE 12.5 MG HALF TABLET
12.5000 mg | ORAL_TABLET | Freq: Every day | ORAL | Status: DC
  Administered 2022-11-24: 12.5 mg via ORAL
  Filled 2022-11-24: qty 1

## 2022-11-24 MED ORDER — FUROSEMIDE 40 MG PO TABS: 60.0000 mg | ORAL_TABLET | Freq: Every day | ORAL | 1 refills | Status: AC

## 2022-11-24 MED ORDER — SPIRONOLACTONE 25 MG PO TABS: 12.5000 mg | ORAL_TABLET | Freq: Every day | ORAL | 0 refills | Status: AC

## 2022-11-24 NOTE — Discharge Instructions (Signed)
Recommendations for Outpatient Follow-up:  Cardiology, A-fib clinic on 6/27 Dr. Herbie Baltimore, [facility] heart care on 7/23 Outpatient palliative care PCP in 1 week

## 2022-11-24 NOTE — Discharge Summary (Signed)
Physician Discharge Summary  [name] ZOX:[id number] DOB: 1927/09/25 DOA: 11/22/2022  PCP: Mary Ates, MD  Admit date: 11/22/2022 Discharge date: 11/24/2022  Time spent: 35 minutes  Recommendations for Outpatient Follow-up:  Cardiology, A-fib clinic on 6/27 Dr. Herbie Mcgrath, [facility] heart care on 7/23 Outpatient palliative care PCP in 1 week   Discharge Diagnoses:  Principal Problem:   Acute on chronic diastolic CHF (congestive heart failure) (HCC) Active Problems:   Obesity (BMI 30-39.9)   Essential hypertension   CAD S/P PCI: OM2 PTCA 7/02, OM2 Stent 9/02, ISR 11/03 - PTCA; RCA DES 1 /14/14   Chronic hypoxic respiratory failure (HCC)   Carcinoma of ascending colon (HCC)   Asthma   Hyperlipidemia with target LDL less than 70   Discharge Condition: Improved  Diet recommendation: Low-sodium  Filed Weights   11/22/22 2300 11/23/22 0424 11/24/22 0542  Weight: 59.9 kg 59.9 kg 60.9 kg    History of present illness:  89/F with history of CAD, paroxysmal A-fib, diastolic CHF, hypertension, dyslipidemia, chronic respiratory failure on 2 L home O2, prior history of colon cancer presented to the ED with ongoing dyspnea on exertion and some lower extremity edema.  Sent to the ED from PCPs office on account of shortness of breath.  Compliant with Lasix, tries to follow a low-salt diet Workup in the ED noted stable vital signs, creatinine 1.0, BNP 632, chest x-ray with chronic interstitial thickening  Hospital Course:   Dyspnea on exertion Acute on chronic diastolic CHF Moderate mitral regurgitation -Last echo 1/24 with preserved EF, grade 2 diastolic dysfunction, normal RV and moderate mitral regurg -Fourth admission this year -Repeat echo with preserved EF, grade 2 diastolic dysfunction, mild MR, dilated RV -Did not have significant volume overload this admission, treated with a dose of IV Lasix on admission, subsequently transition to oral diuretics, home dose of Lasix increased  from 40 to 60 Mg daily and low-dose Aldactone 12.5 Mg daily added -Advised to call cardiology office for weight gain    Atrial fibrillation > During recent admission and amiodarone was held due to symptomatic bradycardia.  Metoprolol and diltiazem were continued. - Continue metoprolol, diltiazem, Eliquis   CAD status post multiple stenting -Stable, continue metoprolol, Eliquis   Asthma Chronic respiratory failure with hypoxia > On chronic 2 L.,  Stable no wheezing - Continue supplemental oxygen - Continue as needed albuterol   Chronic anemia -Anemia panel with iron deficiency, add oral iron at discharge   Obesity History of colon cancer - Noted  Goals of care, Ms. [name] is a 89 year old female year old female with chronic diastolic CHF, COPD/hypoxic respiratory failure on 2 L home O2, permanent A-fib, frequent hospitalizations with heart failure, seen by palliative care this admission, now DNR, plan for outpatient palliative care   Code Status:             DNR, discussed CODE STATUS with the patient today she wishes to have a natural death  Consultations: Cardiology, palliative care  Discharge Exam: Vitals:   11/24/22 0113 11/24/22 0542  BP: 131/73 137/67  Pulse: 67 72  Resp: 18 18  Temp: (!) 97.4 F (36.3 C) (!) 97.3 F (36.3 C)  SpO2: 94% 91%    Gen: Awake, Alert, Oriented X 3,  HEENT: no JVD Lungs: Good air movement bilaterally, CTAB CVS: S1S2/RRR Abd: soft, Non tender, non distended, BS present Extremities: No edema Skin: no new rashes on exposed skin   Discharge Instructions   Discharge Instructions     Amb  Referral to Palliative Care   Complete by: As directed    Discussion re: Hospice   Diet - low sodium heart healthy   Complete by: As directed    Increase activity slowly   Complete by: As directed       Allergies as of 11/24/2022       Reactions   Brilinta [ticagrelor] Shortness Of Breath   Bleeding    Lipitor [atorvastatin] Shortness Of Breath,  Other (See Comments)   Myalgias    Plavix [clopidogrel] Shortness Of Breath, Other (See Comments)   Bleeding    Codeine Nausea And Vomiting   Mobic [meloxicam] Other (See Comments)   Stomach upset   Paxil [paroxetine] Other (See Comments)   Stomach upset Myalgias  Chills    Sulfa Antibiotics Diarrhea, Other (See Comments)   Stomach upset   Sulfonamide Derivatives Diarrhea, Other (See Comments)   Stomach upset        Medication List     TAKE these medications    acetaminophen 500 MG tablet Commonly known as: TYLENOL Take 500 mg by mouth in the morning and at bedtime.   albuterol 108 (90 Base) MCG/ACT inhaler Commonly known as: VENTOLIN HFA Inhale 2 puffs into the lungs every 6 (six) hours as needed for wheezing.   Cartia XT 300 MG 24 hr capsule Generic drug: diltiazem Take 1 capsule (300 mg total) by mouth daily.   cetirizine 10 MG tablet Commonly known as: ZYRTEC Take 10 mg by mouth at bedtime as needed for allergies.   Eliquis 2.5 MG Tabs tablet Generic drug: apixaban Take 1 tablet (2.5 mg total) by mouth 2 (two) times daily.   escitalopram 5 MG tablet Commonly known as: LEXAPRO Take 5 mg by mouth daily.   fluticasone 50 MCG/ACT nasal spray Commonly known as: FLONASE Place 1 spray into the nose daily as needed for allergies.   furosemide 40 MG tablet Commonly known as: LASIX Take 1.5 tablets (60 mg total) by mouth daily. What changed: how much to take   Melatonin 5 MG Chew Chew 5 mg by mouth at bedtime as needed (sleep).   metoprolol tartrate 25 MG tablet Commonly known as: LOPRESSOR Take 25 mg by mouth 2 (two) times daily.   nitroGLYCERIN 0.4 MG SL tablet Commonly known as: NITROSTAT Place 1 tablet (0.4 mg total) under the tongue every 5 (five) minutes as needed for chest pain.   spironolactone 25 MG tablet Commonly known as: ALDACTONE Take 0.5 tablets (12.5 mg total) by mouth daily.       Allergies  Allergen Reactions   Brilinta  [Ticagrelor] Shortness Of Breath    Bleeding    Lipitor [Atorvastatin] Shortness Of Breath and Other (See Comments)    Myalgias    Plavix [Clopidogrel] Shortness Of Breath and Other (See Comments)    Bleeding    Codeine Nausea And Vomiting   Mobic [Meloxicam] Other (See Comments)    Stomach upset   Paxil [Paroxetine] Other (See Comments)    Stomach upset Myalgias  Chills    Sulfa Antibiotics Diarrhea and Other (See Comments)    Stomach upset   Sulfonamide Derivatives Diarrhea and Other (See Comments)    Stomach upset    Follow-up Information     Mary Ates, MD Follow up on 11/27/2022.   Specialty: Internal Medicine Why: 12:00 has an apt already Contact information: [address] [employer] [address] [place] [address] [phone]         Care, [facility]  Follow up.   Specialty: Home Health Services Why: Agency will contact you with apt times Contact information: [address] [place] [address] [phone]                  The results of significant diagnostics from this hospitalization (including imaging, microbiology, ancillary and laboratory) are listed below for reference.    Significant Diagnostic Studies: ECHOCARDIOGRAM COMPLETE  Result Date: 11/23/2022    ECHOCARDIOGRAM REPORT   Patient Name:   GAVRIELLA STRATFORD Date of Exam: 11/23/2022 Medical Rec #:  [id number]      Height:       59.0 in Accession #:    [id number]     Weight:       132.1 lb Date of Birth:  Aug 05, 1927      BSA:          1.546 m Patient Age:    89 years       BP:           141/67 mmHg Patient Gender: F              HR:           74 bpm. Exam Location:  Inpatient Procedure: 2D Echo, Color Doppler and Cardiac Doppler Indications:    CHF  History:        Patient has prior history of Echocardiogram examinations, most                 recent 07/29/2022. CHF, CAD and Previous Myocardial Infarction,                 Arrythmias:Atrial Fibrillation; Risk Factors:Dyslipidemia and                  Hypertension.  Sonographer:    [facility] Referring Phys: 62 Tonya Carlile IMPRESSIONS  1. Left ventricular ejection fraction, by estimation, is 55 to 60%. The left ventricle has normal function. The left ventricle has no regional wall motion abnormalities. Left ventricular diastolic parameters are consistent with Grade II diastolic dysfunction (pseudonormalization).  2. Mildly D-shaped interventricular septum suggestive of a degree of RV pressure/volume overload. Right ventricular systolic function is mildly reduced. The right ventricular size is moderately enlarged. There is moderately elevated pulmonary artery systolic pressure. The estimated right ventricular systolic pressure is 56.4 mmHg.  3. Left atrial size was mildly dilated.  4. Right atrial size was mildly dilated.  5. The mitral valve is normal in structure. Mild mitral valve regurgitation. No evidence of mitral stenosis.  6. The aortic valve is tricuspid. Aortic valve regurgitation is not visualized. No aortic stenosis is present.  7. The inferior vena cava is normal in size with <50% respiratory variability, suggesting right atrial pressure of 8 mmHg. FINDINGS  Left Ventricle: Left ventricular ejection fraction, by estimation, is 55 to 60%. The left ventricle has normal function. The left ventricle has no regional wall motion abnormalities. The left ventricular internal cavity size was normal in size. There is  no left ventricular hypertrophy. Left ventricular diastolic parameters are consistent with Grade II diastolic dysfunction (pseudonormalization). Right Ventricle: Mildly D-shaped interventricular septum suggestive of a degree of RV pressure/volume overload. The right ventricular size is moderately enlarged. No increase in right ventricular wall thickness. Right ventricular systolic function is mildly reduced. There is moderately elevated pulmonary artery systolic pressure. The tricuspid regurgitant velocity is 3.48 m/s, and with an assumed  right atrial pressure of 8 mmHg, the estimated right ventricular systolic pressure is  56.4 mmHg. Left Atrium: Left atrial size was mildly dilated. Right Atrium: Right atrial size was mildly dilated. Pericardium: There is no evidence of pericardial effusion. Mitral Valve: The mitral valve is normal in structure. There is mild calcification of the mitral valve leaflet(s). Mild mitral annular calcification. Mild mitral valve regurgitation. No evidence of mitral valve stenosis. Tricuspid Valve: The tricuspid valve is normal in structure. Tricuspid valve regurgitation is trivial. Aortic Valve: The aortic valve is tricuspid. Aortic valve regurgitation is not visualized. No aortic stenosis is present. Pulmonic Valve: The pulmonic valve was normal in structure. Pulmonic valve regurgitation is mild. Aorta: The aortic root is normal in size and structure. Venous: The inferior vena cava is normal in size with less than 50% respiratory variability, suggesting right atrial pressure of 8 mmHg. IAS/Shunts: No atrial level shunt detected by color flow Doppler.  LEFT VENTRICLE PLAX 2D LVIDd:         3.70 cm LVIDs:         2.70 cm LV PW:         1.00 cm LV IVS:        1.10 cm LVOT diam:     1.80 cm LVOT Area:     2.54 cm  LV Volumes (MOD) LV vol d, MOD A2C: 43.6 ml LV vol d, MOD A4C: 45.9 ml LV vol s, MOD A2C: 21.1 ml LV vol s, MOD A4C: 20.0 ml LV SV MOD A2C:     22.5 ml LV SV MOD A4C:     45.9 ml LV SV MOD BP:      24.6 ml RIGHT VENTRICLE RV Basal diam:  3.90 cm RV Mid diam:    3.70 cm RV S prime:     10.80 cm/s TAPSE (M-mode): 1.5 cm LEFT ATRIUM             Index        RIGHT ATRIUM           Index LA diam:        3.50 cm 2.26 cm/m   RA Area:     17.30 cm LA Vol (A2C):   44.2 ml 28.59 ml/m  RA Volume:   47.00 ml  30.40 ml/m LA Vol (A4C):   36.4 ml 23.54 ml/m LA Biplane Vol: 39.7 ml 25.68 ml/m   AORTA Ao Root diam: 2.70 cm TRICUSPID VALVE TR Peak grad:   48.4 mmHg TR Vmax:        348.00 cm/s  SHUNTS Systemic Diam: 1.80 cm  Dalton McleanMD Electronically signed by Wilfred Lacy Signature Date/Time: 11/23/2022/2:01:33 PM    Final    DG Chest Portable 1 View  Result Date: 11/22/2022 CLINICAL DATA:  Dyspnea.  Shortness of breath.  Extremity swelling. EXAM: PORTABLE CHEST 1 VIEW COMPARISON:  Chest radiographs 11/03/2022 and 08/06/2022 FINDINGS: Cardiac silhouette is again mildly enlarged. Mild-to-moderate calcification within aortic arch. Mildly decreased lung volumes. Mild bilateral chronic interstitial thickening is mildly improved from 11/03/2022 and 08/06/2022 radiographs. This appears unchanged from 07/27/2022. No focal airspace opacity. No pleural effusion pneumothorax. No acute skeletal abnormality. Severe bilateral glenohumeral osteoarthritis. IMPRESSION: 1. No active disease. 2. Mild chronic interstitial thickening, mildly improved from 11/03/2022 and 08/06/2022. Electronically Signed   By: Neita Garnet M.D.   On: 11/22/2022 13:05   DG Chest Port 1 View  Result Date: 11/03/2022 CLINICAL DATA:  Shortness of breath EXAM: PORTABLE CHEST 1 VIEW COMPARISON:  08/06/2022 FINDINGS: Stable cardiomegaly. Aortic atherosclerotic calcification. Mild pulmonary vascular congestion bibasilar atelectasis or  scarring. No definite pleural effusion. No pneumothorax. No displaced rib fractures. Advanced arthritis both shoulders. IMPRESSION: Cardiomegaly with mild pulmonary vascular congestion and bibasilar atelectasis or scarring. Electronically Signed   By: Minerva Fester M.D.   On: 11/03/2022 17:49    Microbiology: No results found for this or any previous visit (from the past 240 hour(s)).   Labs: Basic Metabolic Panel: Recent Labs  Lab 11/22/22 1248 11/22/22 2048 11/23/22 0054 11/23/22 1630 11/24/22 0051  NA 138  --  139  --  138  K 3.6  --  2.6*  --  3.8  CL 102  --  101  --  101  CO2 24  --  28  --  28  GLUCOSE 135*  --  125*  --  138*  BUN 19  --  14  --  15  CREATININE 1.08*  --  0.97  --  1.09*  CALCIUM 9.1  --   8.8*  --  9.4  MG  --  1.5*  --  2.1  --    Liver Function Tests: Recent Labs  Lab 11/22/22 1248 11/23/22 0054  AST 27 18  ALT 14 14  ALKPHOS 50 50  BILITOT 1.4* 1.3*  PROT 6.4* 6.1*  ALBUMIN 3.5 3.2*   No results for input(s): "LIPASE", "AMYLASE" in the last 168 hours. No results for input(s): "AMMONIA" in the last 168 hours. CBC: Recent Labs  Lab 11/22/22 1248 11/23/22 0054 11/24/22 0051  WBC 7.1 6.9 8.7  NEUTROABS 5.7  --   --   HGB 8.6* 8.6* 9.2*  HCT 27.8* 27.4* 29.5*  MCV 90.8 89.8 91.9  PLT 208 197 243   Cardiac Enzymes: No results for input(s): "CKTOTAL", "CKMB", "CKMBINDEX", "TROPONINI" in the last 168 hours. BNP: BNP (last 3 results) Recent Labs    08/06/22 0945 11/03/22 1728 11/22/22 1248  BNP 243.6* 567.8* 632.1*    ProBNP (last 3 results) No results for input(s): "PROBNP" in the last 8760 hours.  CBG: No results for input(s): "GLUCAP" in the last 168 hours.     Signed:  Zannie Cove MD.  [facility] 11/24/2022, 1:00 PM

## 2022-11-24 NOTE — TOC Transition Note (Signed)
Transition of Care [facility]) - CM/SW Discharge Note   Patient Details  Name: [name] MRN: [id number] Date of Birth: [birth year]  Transition of Care [facility] CM/SW Contact:  Lawerance Sabal, RN Phone Number: 11/24/2022, 12:31 PM   Clinical Narrative:     Sherron Monday w patient's daughter over the phone.  Discussed palliative options and referral made to [facility]. He understands they will reach out to her mid week due to the holiday weekend.  Notified [facility] of discharge.      Barriers to Discharge: Continued Medical Work up   Patient Goals and CMS Choice CMS Medicare.gov Compare Post Acute Care list provided to:: Patient Represenative (must comment) Choice offered to / list presented to : Adult Children  Discharge Placement                         Discharge Plan and Services Additional resources added to the After Visit Summary for   In-house Referral: NA Discharge Planning Services: CM Consult            DME Arranged: N/A DME Agency: NA       HH Arranged: RN, Disease Management, PT HH Agency: [facility] Date [facility] Agency Contacted: 11/23/22 Time HH Agency Contacted: 1531 Representative spoke with at [facility] Agency: Kandee Keen  Social Determinants of Health (SDOH) Interventions SDOH Screenings   Food Insecurity: No Food Insecurity (11/03/2022)  Housing: Patient Declined (11/03/2022)  Transportation Needs: No Transportation Needs (11/03/2022)  Utilities: Not At Risk (11/03/2022)  Tobacco Use: Medium Risk (11/22/2022)     Readmission Risk Interventions    11/06/2022    1:10 PM  Readmission Risk Prevention Plan  Transportation Screening Complete  PCP or Specialist Appt within 3-5 Days Complete  HRI or Home Care Consult Complete  Palliative Care Screening Not Applicable  Medication Review (RN Care Manager) Complete

## 2022-11-24 NOTE — Consult Note (Signed)
Palliative Medicine Inpatient Consult Note  Consulting Provider:  Zannie Cove, MD   Reason for consult:   Palliative Care Consult Services Palliative Medicine Consult  Reason for Consult? goals of care, freq admissions, CHF and COPD   11/24/2022  HPI:  Per intake H&P --> 89/F with history of CAD, paroxysmal A-fib, diastolic CHF, hypertension, dyslipidemia, chronic respiratory failure on 2 L home O2, prior history of colon cancer presented to the ED with ongoing dyspnea on exertion and some lower extremity edema.  Sent to the ED from PCPs office on account of shortness of breath.    The palliative care team was asked to get involved in the setting of multiple acute on chronic disease processes to further discuss goals of care.  Clinical Assessment/Goals of Care:  *Please note that this is a verbal dictation therefore any spelling or grammatical errors are due to the "Dragon Medical One" system interpretation.  I have reviewed medical records including EPIC notes, labs and imaging, received report from bedside RN, assessed the patient who is sitting up in bed eating her breakfast.    I met with [name] and her daughter, [name] to further discuss diagnosis prognosis, GOC, EOL wishes, disposition and options.   I introduced Palliative Medicine as specialized medical care for people living with serious illness. It focuses on providing relief from the symptoms and stress of a serious illness. The goal is to improve quality of life for both the patient and the family.  Medical History Review and Understanding:  A review of [name]'s past medical history inclusive of coronary artery disease, paroxysmal A-fib, diastolic heart failure, hypertension, and COPD/ashma was held.  Social History:  [name] is originally from [place] though moved to [place] with her husband who was in the [employer] in [place].  She has been a widow since 2021.  She has 1 son and 1 daughter, 2  grandchildren, and 1 great grandchild.  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  She shares that she has a love for her decorative painting and her family.  She is a woman of faith and practices within the [facility] denomination.  Functional and Nutritional State:  Preceding hospitalization [name] had been living independently using a rollator for stable mobility.  She is able to attend to B ADLs.  Her daughter works close and checks in on her regularly.  Advance Directives:  A detailed discussion was had today regarding advanced directives.  Darcia Runner, broadcasting/film/video is her child [place].  Code Status:  Concepts specific to code status, artifical feeding and hydration, continued IV antibiotics and rehospitalization was had.  The difference between a aggressive medical intervention path  and a palliative comfort care path for this patient at this time was had.   Arreanna is an established DO NOT RESUSCITATE DO NOT INTUBATE CODE STATUS.  Discussion:  A discussion was held in the setting of [name]'s recent and recurrent hospitalizations.  [name]'s daughter shares that since early this year-January 26 she has had recurrent rehospitalization's for both atrial fibrillation and heart failure.  We reviewed the various medication she has been on and the side effects associated with them most inclusive of amiodarone whereby her heart rate dropped too low.  We also discussed her shortness of breath and the subtle signs to identify worsening of her disease as well as return precautions for her doctors.  We reviewed the chronic nature of congestive heart failure and how patients go  through stages.  We reviewed that even with the best medical therapy eventually patients get to a point where the medications utilized to help with evacuation of fluid are no longer as effective.  At this time patient's daughter shares it has been a bit of a learning curve and  both she and her mother identifying what will and will work for her.  We did determine if [name] continues to come back and forth to the hospital and may be worth looking into long-term living situations such as an assisted living or perhaps 1 of Arden's children living with her.  Patient's daughter was receptive to this though she shares her mom is stubborn and has never wanted to make a move.  [name] wants to honor her mother's wishes of being in her home as long as possible.  I was able to discuss the idea of outpatient palliative care and support which Shadiamon and her daughter [name] are in agreement with.  Decision MakerOkey Mcgrath Daughter  [phone]   SUMMARY OF RECOMMENDATIONS   DNAR/DNI  Open and honest conversation held in the setting of patients heart failure - Education provided  Plan for OP Palliative support on discharge  Primary to team to discharge once medically optimized  Code Status/Advance Care Planning: DNAR/DNI  Palliative Prophylaxis:  Aspiration, Bowel Regimen, Delirium Protocol, Frequent Pain Assessment, Oral Care, Palliative Wound Care, and Turn Reposition  Additional Recommendations (Limitations, Scope, Preferences): Continue current care  Psycho-social/Spiritual:  Desire for further Chaplaincy support: Yes Additional Recommendations: Education on heart failure   Prognosis: High chronic disease burden and multiple readmissions place [name] at higher risk of 12 month mortality.   Discharge Planning: Discharge home with OP Palliative support.  Vitals:   11/24/22 0542 11/24/22 1118  BP: 137/67 133/68  Pulse: 72 74  Resp: 18   Temp: (!) 97.3 F (36.3 C) 98.1 F (36.7 C)  SpO2: 91% 93%    Intake/Output Summary (Last 24 hours) at 11/24/2022 1321 Last data filed at 11/24/2022 2956 Gross per 24 hour  Intake 123 ml  Output 700 ml  Net -577 ml   Last Weight  Most recent update: 11/24/2022  5:52 AM    Weight  60.9 kg (134 lb 4.2 oz)             Gen:  Elderly Caucasian F in NAD HEENT: moist mucous membranes CV: Regular rate and irregular rhythm PULM:  On 2 LPM [place], breathing is even and nonlabored ABD: soft/nontender  EXT: Slight edema RLE Neuro: Alert and oriented x3  PPS: 50%   This conversation/these recommendations were discussed with patient primary care team, Dr. Jomarie Longs  Billing based on MDM: High  Problems Addressed: One acute or chronic illness or injury that poses a threat to life or bodily function  Amount and/or Complexity of Data: Category 3:Discussion of management or test interpretation with external physician/other qualified health care professional/appropriate source (not separately reported)  Risks: Decision not to resuscitate or to de-escalate care because of poor prognosis ______________________________________________________ Lamarr Lulas [place] Palliative Medicine Team Team Cell Phone: [phone] Please utilize secure chat with additional questions, if there is no response within 30 minutes please call the above phone number  Palliative Medicine Team providers are available by phone from 7am to 7pm daily and can be reached through the team cell phone.  Should this patient require assistance outside of these hours, please call the patient's attending physician.

## 2022-11-24 NOTE — Progress Notes (Signed)
Rounding Note    Patient Name: [name] Date of Encounter: 11/24/2022  [facility] Cardiologist: Bryan Lemma, MD   Subjective   Feeling much better and wants to go home today.  She was able to sleep fully supine in bed last night. She is on oxygen at 2 L/min, same as she uses chronically. She weighs daily and reports that at home her weight has been very steady in the 135-139 pound range.  The hospital bed scale actually shows a lower weight of 132-134 pounds.  She does not think that her episode of heart failure exacerbation was preceded by a major weight gain.  She avoids salty foods. Net diuresis 1.2 L.  Marginal increase in creatinine.  Potassium repleted.  Inpatient Medications    Scheduled Meds:  apixaban  2.5 mg Oral BID   diltiazem  300 mg Oral Daily   escitalopram  5 mg Oral Daily   furosemide  40 mg Oral Daily   melatonin  10 mg Oral QHS   metoprolol tartrate  25 mg Oral BID   sodium chloride flush  3 mL Intravenous Q12H   Continuous Infusions:  PRN Meds: acetaminophen **OR** acetaminophen, albuterol, polyethylene glycol   Vital Signs    Vitals:   11/23/22 1520 11/23/22 2046 11/24/22 0113 11/24/22 0542  BP: (!) 150/70 (!) 109/49 131/73 137/67  Pulse: 72 (!) 112 67 72  Resp: 15  18 18   Temp: 97.7 F (36.5 C) 100 F (37.8 C) (!) 97.4 F (36.3 C) (!) 97.3 F (36.3 C)  TempSrc: Oral Oral Oral Oral  SpO2: 100% 95% 94% 91%  Weight:    60.9 kg  Height:        Intake/Output Summary (Last 24 hours) at 11/24/2022 1231 Last data filed at 11/24/2022 6213 Gross per 24 hour  Intake 243 ml  Output 700 ml  Net -457 ml      11/24/2022    5:42 AM 11/23/2022    4:24 AM 11/22/2022   11:00 PM  Last 3 Weights  Weight (lbs) 134 lb 4.2 oz 132 lb 0.9 oz 132 lb 0.9 oz  Weight (kg) 60.9 kg 59.9 kg 59.9 kg      Telemetry    Sinus rhythm with frequent PACs and occasional post ectopic pauses all less than 2 seconds in duration.  No atrial fibrillation  is seen rare ventricular ectopic beats, isolated - Personally Reviewed  ECG    ECG from 11/12/2022 shows sinus rhythm with PACs, no changes.- Personally Reviewed  Physical Exam  Elderly lady, appears very comfortable lying almost fully supine in bed, on O2 at 2 L by nasal cannula GEN: No acute distress.   Neck: No JVD Cardiac: RRR with rare ectopy, 1-2/6 aortic ejection murmur, no diastolic murmurs, rubs, or gallops.  Respiratory: Has some crackles in the bases that appear to clear with deep breathing/cough consistent with atelectasis, otherwise clear to auscultation bilaterally. GI: Soft, nontender, non-distended  MS: No edema; No deformity. Neuro:  Nonfocal other than hard of hearing Psych: Normal affect   Labs    High Sensitivity Troponin:   Recent Labs  Lab 11/03/22 1728 11/03/22 1928 11/22/22 1248 11/22/22 1606  TROPONINIHS 29* 27* 28* 33*     Chemistry Recent Labs  Lab 11/22/22 1248 11/22/22 2048 11/23/22 0054 11/23/22 1630 11/24/22 0051  NA 138  --  139  --  138  K 3.6  --  2.6*  --  3.8  CL 102  --  101  --  101  CO2 24  --  28  --  28  GLUCOSE 135*  --  125*  --  138*  BUN 19  --  14  --  15  CREATININE 1.08*  --  0.97  --  1.09*  CALCIUM 9.1  --  8.8*  --  9.4  MG  --  1.5*  --  2.1  --   PROT 6.4*  --  6.1*  --   --   ALBUMIN 3.5  --  3.2*  --   --   AST 27  --  18  --   --   ALT 14  --  14  --   --   ALKPHOS 50  --  50  --   --   BILITOT 1.4*  --  1.3*  --   --   GFRNONAA 48*  --  54*  --  47*  ANIONGAP 12  --  10  --  9    Lipids No results for input(s): "CHOL", "TRIG", "HDL", "LABVLDL", "LDLCALC", "CHOLHDL" in the last 168 hours.  Hematology Recent Labs  Lab 11/22/22 1248 11/23/22 0054 11/23/22 1409 11/24/22 0051  WBC 7.1 6.9  --  8.7  RBC 3.06* 3.05* 3.40* 3.21*  HGB 8.6* 8.6*  --  9.2*  HCT 27.8* 27.4*  --  29.5*  MCV 90.8 89.8  --  91.9  MCH 28.1 28.2  --  28.7  MCHC 30.9 31.4  --  31.2  RDW 14.4 14.3  --  14.4  PLT 208 197  --   243   Thyroid No results for input(s): "TSH", "FREET4" in the last 168 hours.  BNP Recent Labs  Lab 11/22/22 1248  BNP 632.1*    DDimer No results for input(s): "DDIMER" in the last 168 hours.   Radiology    ECHOCARDIOGRAM COMPLETE  Result Date: 11/23/2022    ECHOCARDIOGRAM REPORT   Patient Name:   [name] Date of Exam: 11/23/2022 Medical Rec #:  [id number]      Height:       59.0 in Accession #:    [id number]     Weight:       132.1 lb Date of Birth:  05/02/1928      BSA:          1.546 m Patient Age:    89 years       BP:           141/67 mmHg Patient Gender: F              HR:           74 bpm. Exam Location:  Inpatient Procedure: 2D Echo, Color Doppler and Cardiac Doppler Indications:    CHF  History:        Patient has prior history of Echocardiogram examinations, most                 recent 07/29/2022. CHF, CAD and Previous Myocardial Infarction,                 Arrythmias:Atrial Fibrillation; Risk Factors:Dyslipidemia and                 Hypertension.  Sonographer:    [facility] Referring Phys: [id number] PREETHA JOSEPH IMPRESSIONS  1. Left ventricular ejection fraction, by estimation, is 55 to 60%. The left ventricle has normal function. The left ventricle has no regional wall motion abnormalities. Left ventricular diastolic parameters  are consistent with Grade II diastolic dysfunction (pseudonormalization).  2. Mildly D-shaped interventricular septum suggestive of a degree of RV pressure/volume overload. Right ventricular systolic function is mildly reduced. The right ventricular size is moderately enlarged. There is moderately elevated pulmonary artery systolic pressure. The estimated right ventricular systolic pressure is 56.4 mmHg.  3. Left atrial size was mildly dilated.  4. Right atrial size was mildly dilated.  5. The mitral valve is normal in structure. Mild mitral valve regurgitation. No evidence of mitral stenosis.  6. The aortic valve is tricuspid. Aortic valve regurgitation is not  visualized. No aortic stenosis is present.  7. The inferior vena cava is normal in size with <50% respiratory variability, suggesting right atrial pressure of 8 mmHg. FINDINGS  Left Ventricle: Left ventricular ejection fraction, by estimation, is 55 to 60%. The left ventricle has normal function. The left ventricle has no regional wall motion abnormalities. The left ventricular internal cavity size was normal in size. There is  no left ventricular hypertrophy. Left ventricular diastolic parameters are consistent with Grade II diastolic dysfunction (pseudonormalization). Right Ventricle: Mildly D-shaped interventricular septum suggestive of a degree of RV pressure/volume overload. The right ventricular size is moderately enlarged. No increase in right ventricular wall thickness. Right ventricular systolic function is mildly reduced. There is moderately elevated pulmonary artery systolic pressure. The tricuspid regurgitant velocity is 3.48 m/s, and with an assumed right atrial pressure of 8 mmHg, the estimated right ventricular systolic pressure is 56.4 mmHg. Left Atrium: Left atrial size was mildly dilated. Right Atrium: Right atrial size was mildly dilated. Pericardium: There is no evidence of pericardial effusion. Mitral Valve: The mitral valve is normal in structure. There is mild calcification of the mitral valve leaflet(s). Mild mitral annular calcification. Mild mitral valve regurgitation. No evidence of mitral valve stenosis. Tricuspid Valve: The tricuspid valve is normal in structure. Tricuspid valve regurgitation is trivial. Aortic Valve: The aortic valve is tricuspid. Aortic valve regurgitation is not visualized. No aortic stenosis is present. Pulmonic Valve: The pulmonic valve was normal in structure. Pulmonic valve regurgitation is mild. Aorta: The aortic root is normal in size and structure. Venous: The inferior vena cava is normal in size with less than 50% respiratory variability, suggesting right  atrial pressure of 8 mmHg. IAS/Shunts: No atrial level shunt detected by color flow Doppler.  LEFT VENTRICLE PLAX 2D LVIDd:         3.70 cm LVIDs:         2.70 cm LV PW:         1.00 cm LV IVS:        1.10 cm LVOT diam:     1.80 cm LVOT Area:     2.54 cm  LV Volumes (MOD) LV vol d, MOD A2C: 43.6 ml LV vol d, MOD A4C: 45.9 ml LV vol s, MOD A2C: 21.1 ml LV vol s, MOD A4C: 20.0 ml LV SV MOD A2C:     22.5 ml LV SV MOD A4C:     45.9 ml LV SV MOD BP:      24.6 ml RIGHT VENTRICLE RV Basal diam:  3.90 cm RV Mid diam:    3.70 cm RV S prime:     10.80 cm/s TAPSE (M-mode): 1.5 cm LEFT ATRIUM             Index        RIGHT ATRIUM           Index LA diam:  3.50 cm 2.26 cm/m   RA Area:     17.30 cm LA Vol (A2C):   44.2 ml 28.59 ml/m  RA Volume:   47.00 ml  30.40 ml/m LA Vol (A4C):   36.4 ml 23.54 ml/m LA Biplane Vol: 39.7 ml 25.68 ml/m   AORTA Ao Root diam: 2.70 cm TRICUSPID VALVE TR Peak grad:   48.4 mmHg TR Vmax:        348.00 cm/s  SHUNTS Systemic Diam: 1.80 cm Dalton McleanMD Electronically signed by Wilfred Lacy Signature Date/Time: 11/23/2022/2:01:33 PM    Final    DG Chest Portable 1 View  Result Date: 11/22/2022 CLINICAL DATA:  Dyspnea.  Shortness of breath.  Extremity swelling. EXAM: PORTABLE CHEST 1 VIEW COMPARISON:  Chest radiographs 11/03/2022 and 08/06/2022 FINDINGS: Cardiac silhouette is again mildly enlarged. Mild-to-moderate calcification within aortic arch. Mildly decreased lung volumes. Mild bilateral chronic interstitial thickening is mildly improved from 11/03/2022 and 08/06/2022 radiographs. This appears unchanged from 07/27/2022. No focal airspace opacity. No pleural effusion pneumothorax. No acute skeletal abnormality. Severe bilateral glenohumeral osteoarthritis. IMPRESSION: 1. No active disease. 2. Mild chronic interstitial thickening, mildly improved from 11/03/2022 and 08/06/2022. Electronically Signed   By: Neita Garnet M.D.   On: 11/22/2022 13:05    Cardiac Studies  Cardiogram  11/23/2022 1. Left ventricular ejection fraction, by estimation, is 55 to 60%. The left ventricle has normal function. The left ventricle has no regional wall motion abnormalities. Left ventricular diastolic parameters are consistent with Grade II diastolic dysfunction (pseudonormalization).   2. Mildly D-shaped interventricular septum suggestive of a degree of RV pressure/volume overload. Right ventricular systolic function is mildly reduced. The right ventricular size is moderately enlarged. There is moderately elevated pulmonary artery systolic pressure. The estimated right ventricular systolic pressure is 56.4 mmHg.   3. Left atrial size was mildly dilated.   4. Right atrial size was mildly dilated.   5. The mitral valve is normal in structure. Mild mitral valve regurgitation. No evidence of mitral stenosis.   6. The aortic valve is tricuspid. Aortic valve regurgitation is not visualized. No aortic stenosis is present.   7. The inferior vena cava is normal in size with <50% respiratory variability, suggesting right atrial pressure of 8 mmHg.  Patient Profile     89 y.o. female with long history of CAD and multiple stents, chronic respiratory insufficiency with hypoxia on home oxygen, hypertension, hyperlipidemia, paroxysmal atrial fibrillation, chronic heart failure with preserved ejection fraction, history of colon cancer and anemia re-admitted for acute on chronic heart failure exacerbation.  Assessment & Plan    She feels much better and feels that she is back to baseline.  Clinically she appears euvolemic.  She has not had angina.  On admission her BNP was 632, considerably higher than the previous result of 243 in February. 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   She has been on amiodarone since 10/22/2022 and was amiodarone was subsequently discontinued  when she was hospitalized for heart failure 11/03/2022 - 11/06/2022.  Arrhythmia has not been a problem during the current episode of hospitalization.  She has fairly frequent PACs but no significant sustained arrhythmia and no severe bradycardia or lengthy pauses.  [facility] will sign off.   Medication Recommendations:  Continue current doses of apixaban, diltiazem, metoprolol  - increase the furosemide to 60 mg daily.   - Add spironolactone  12.5 mg daily - And consider SGLT2 inhibitor as an outpatient, Apparently she has had side effects with statins in the past.  No recent lipid profile has been obtained.  Option to discuss PCSK9 inhibitors as an outpatient. Other recommendations (labs, testing, etc): Low-salt diet.  Discussed daily weight monitoring, which she is already doing.  She should call our office if she gains 3 pounds in 24 hours or 5 pounds in a week for diuretic dose adjustment. Follow up as an outpatient: He has an appointment scheduled 12/27/2022 in the A-fib clinic on 01/22/2023 with Dr. Herbie Baltimore.  For questions or updates, please contact [facility] Please consult [email or web address] for contact info under        Signed, Thurmon Fair, MD  11/24/2022, 12:31 PM

## 2022-11-27 DIAGNOSIS — I130 Hypertensive heart and chronic kidney disease with heart failure and stage 1 through stage 4 chronic kidney disease, or unspecified chronic kidney disease: Secondary | ICD-10-CM | POA: Diagnosis not present

## 2022-11-27 DIAGNOSIS — R350 Frequency of micturition: Secondary | ICD-10-CM | POA: Diagnosis not present

## 2022-11-27 DIAGNOSIS — I5032 Chronic diastolic (congestive) heart failure: Secondary | ICD-10-CM | POA: Diagnosis not present

## 2022-11-29 DIAGNOSIS — I110 Hypertensive heart disease with heart failure: Secondary | ICD-10-CM | POA: Diagnosis not present

## 2022-11-29 DIAGNOSIS — D500 Iron deficiency anemia secondary to blood loss (chronic): Secondary | ICD-10-CM | POA: Diagnosis not present

## 2022-11-29 DIAGNOSIS — I5033 Acute on chronic diastolic (congestive) heart failure: Secondary | ICD-10-CM | POA: Diagnosis not present

## 2022-11-29 DIAGNOSIS — I480 Paroxysmal atrial fibrillation: Secondary | ICD-10-CM | POA: Diagnosis not present

## 2022-11-29 DIAGNOSIS — J9611 Chronic respiratory failure with hypoxia: Secondary | ICD-10-CM | POA: Diagnosis not present

## 2022-11-30 DIAGNOSIS — J9611 Chronic respiratory failure with hypoxia: Secondary | ICD-10-CM | POA: Diagnosis not present

## 2022-11-30 DIAGNOSIS — I5033 Acute on chronic diastolic (congestive) heart failure: Secondary | ICD-10-CM | POA: Diagnosis not present

## 2022-11-30 DIAGNOSIS — I480 Paroxysmal atrial fibrillation: Secondary | ICD-10-CM | POA: Diagnosis not present

## 2022-11-30 DIAGNOSIS — D500 Iron deficiency anemia secondary to blood loss (chronic): Secondary | ICD-10-CM | POA: Diagnosis not present

## 2022-11-30 DIAGNOSIS — I110 Hypertensive heart disease with heart failure: Secondary | ICD-10-CM | POA: Diagnosis not present

## 2022-12-02 DIAGNOSIS — I5033 Acute on chronic diastolic (congestive) heart failure: Secondary | ICD-10-CM | POA: Diagnosis not present

## 2022-12-02 DIAGNOSIS — I480 Paroxysmal atrial fibrillation: Secondary | ICD-10-CM | POA: Diagnosis not present

## 2022-12-02 DIAGNOSIS — I110 Hypertensive heart disease with heart failure: Secondary | ICD-10-CM | POA: Diagnosis not present

## 2022-12-02 DIAGNOSIS — D500 Iron deficiency anemia secondary to blood loss (chronic): Secondary | ICD-10-CM | POA: Diagnosis not present

## 2022-12-02 DIAGNOSIS — J9611 Chronic respiratory failure with hypoxia: Secondary | ICD-10-CM | POA: Diagnosis not present

## 2022-12-03 ENCOUNTER — Telehealth: Payer: Self-pay | Admitting: Cardiology

## 2022-12-03 NOTE — Telephone Encounter (Signed)
Call to patient and she states "this is normal".  She states "I've felt better and felt worse"  States the SOB is not worse and comes and goes.  She ask to speak to her daughter.  Her daughter states she saw patient yesterday.  Patient has no swelling and was doing well.  Her irregular heart rate is no different.  She states right now "she is doing very well".  The [facility] nurse is very cautious which she appreciates but she thinks she is OL and doing well from what she (daughter) sees.  Heart monitor mailed back on the 28 th, waiting for results. She is good with appointment for July 23 rd.  She will call if any concerns or thinks patient needs to be seen sooner.  Call to [facility] to inform her as well.

## 2022-12-03 NOTE — Telephone Encounter (Signed)
New Message:     [name] is calling from [place]. She says patient have been having increasing shortness of breath over the past few days.   Pt c/o Shortness Of Breath: STAT if SOB developed within the last 24 hours or pt is noticeably SOB on the phone  1. Are you currently SOB (can you hear that pt is SOB on the phone)? She talked to her yesterday and she was short of breath  2. How long have you been experiencing SOB? Worse over the last 2 days  3. Are you SOB when sitting or when up moving around?  Worse moving around  4. Are you currently experiencing any other symptoms? Still have intermittent irregular heart heart beats- Beth says she needs a sooner appointment

## 2022-12-04 DIAGNOSIS — I480 Paroxysmal atrial fibrillation: Secondary | ICD-10-CM | POA: Diagnosis not present

## 2022-12-06 DIAGNOSIS — J9611 Chronic respiratory failure with hypoxia: Secondary | ICD-10-CM | POA: Diagnosis not present

## 2022-12-06 DIAGNOSIS — D500 Iron deficiency anemia secondary to blood loss (chronic): Secondary | ICD-10-CM | POA: Diagnosis not present

## 2022-12-06 DIAGNOSIS — I5033 Acute on chronic diastolic (congestive) heart failure: Secondary | ICD-10-CM | POA: Diagnosis not present

## 2022-12-06 DIAGNOSIS — I110 Hypertensive heart disease with heart failure: Secondary | ICD-10-CM | POA: Diagnosis not present

## 2022-12-06 DIAGNOSIS — I480 Paroxysmal atrial fibrillation: Secondary | ICD-10-CM | POA: Diagnosis not present

## 2022-12-07 DIAGNOSIS — I5033 Acute on chronic diastolic (congestive) heart failure: Secondary | ICD-10-CM | POA: Diagnosis not present

## 2022-12-07 DIAGNOSIS — J9601 Acute respiratory failure with hypoxia: Secondary | ICD-10-CM | POA: Diagnosis not present

## 2022-12-07 DIAGNOSIS — I4891 Unspecified atrial fibrillation: Secondary | ICD-10-CM | POA: Diagnosis not present

## 2022-12-07 DIAGNOSIS — J45901 Unspecified asthma with (acute) exacerbation: Secondary | ICD-10-CM | POA: Diagnosis not present

## 2022-12-10 DIAGNOSIS — I480 Paroxysmal atrial fibrillation: Secondary | ICD-10-CM | POA: Diagnosis not present

## 2022-12-10 DIAGNOSIS — J9611 Chronic respiratory failure with hypoxia: Secondary | ICD-10-CM | POA: Diagnosis not present

## 2022-12-10 DIAGNOSIS — D500 Iron deficiency anemia secondary to blood loss (chronic): Secondary | ICD-10-CM | POA: Diagnosis not present

## 2022-12-10 DIAGNOSIS — I5033 Acute on chronic diastolic (congestive) heart failure: Secondary | ICD-10-CM | POA: Diagnosis not present

## 2022-12-10 DIAGNOSIS — I110 Hypertensive heart disease with heart failure: Secondary | ICD-10-CM | POA: Diagnosis not present

## 2022-12-12 ENCOUNTER — Telehealth: Payer: Self-pay | Admitting: Cardiology

## 2022-12-12 NOTE — Telephone Encounter (Signed)
Daughter states Lunch time was 40's then went back to 60's.  She used No complaints other than being tired which is every day for her.  Patient states she is fine as well. She used a pulse ox. to check. Advised if hse has irregular heart rate, it maynot pick up as well. Also can be off if patient hands a re cold or has nail polish.  Advised to continue to monitor and also if has low HR to check if possible at radial pulse against a full minute with the clock.  She will monitor and let us know if any concerns

## 2022-12-12 NOTE — Telephone Encounter (Signed)
Called to say patient's daughter called her to say. The patient HR is jumping around from 40s up to the 60s. States that the patient complaint is that she is tired. Please advise

## 2022-12-13 DIAGNOSIS — D500 Iron deficiency anemia secondary to blood loss (chronic): Secondary | ICD-10-CM | POA: Diagnosis not present

## 2022-12-13 DIAGNOSIS — J9611 Chronic respiratory failure with hypoxia: Secondary | ICD-10-CM | POA: Diagnosis not present

## 2022-12-13 DIAGNOSIS — I110 Hypertensive heart disease with heart failure: Secondary | ICD-10-CM | POA: Diagnosis not present

## 2022-12-13 DIAGNOSIS — I480 Paroxysmal atrial fibrillation: Secondary | ICD-10-CM | POA: Diagnosis not present

## 2022-12-13 DIAGNOSIS — I5033 Acute on chronic diastolic (congestive) heart failure: Secondary | ICD-10-CM | POA: Diagnosis not present

## 2022-12-18 DIAGNOSIS — I5033 Acute on chronic diastolic (congestive) heart failure: Secondary | ICD-10-CM | POA: Diagnosis not present

## 2022-12-18 DIAGNOSIS — D500 Iron deficiency anemia secondary to blood loss (chronic): Secondary | ICD-10-CM | POA: Diagnosis not present

## 2022-12-18 DIAGNOSIS — I480 Paroxysmal atrial fibrillation: Secondary | ICD-10-CM | POA: Diagnosis not present

## 2022-12-18 DIAGNOSIS — J9611 Chronic respiratory failure with hypoxia: Secondary | ICD-10-CM | POA: Diagnosis not present

## 2022-12-18 DIAGNOSIS — I110 Hypertensive heart disease with heart failure: Secondary | ICD-10-CM | POA: Diagnosis not present

## 2022-12-19 DIAGNOSIS — I5033 Acute on chronic diastolic (congestive) heart failure: Secondary | ICD-10-CM | POA: Diagnosis not present

## 2022-12-19 DIAGNOSIS — I110 Hypertensive heart disease with heart failure: Secondary | ICD-10-CM | POA: Diagnosis not present

## 2022-12-19 DIAGNOSIS — D500 Iron deficiency anemia secondary to blood loss (chronic): Secondary | ICD-10-CM | POA: Diagnosis not present

## 2022-12-19 DIAGNOSIS — J9611 Chronic respiratory failure with hypoxia: Secondary | ICD-10-CM | POA: Diagnosis not present

## 2022-12-19 DIAGNOSIS — I480 Paroxysmal atrial fibrillation: Secondary | ICD-10-CM | POA: Diagnosis not present

## 2022-12-20 DIAGNOSIS — I110 Hypertensive heart disease with heart failure: Secondary | ICD-10-CM | POA: Diagnosis not present

## 2022-12-20 DIAGNOSIS — J9611 Chronic respiratory failure with hypoxia: Secondary | ICD-10-CM | POA: Diagnosis not present

## 2022-12-20 DIAGNOSIS — I5033 Acute on chronic diastolic (congestive) heart failure: Secondary | ICD-10-CM | POA: Diagnosis not present

## 2022-12-20 DIAGNOSIS — I480 Paroxysmal atrial fibrillation: Secondary | ICD-10-CM | POA: Diagnosis not present

## 2022-12-20 DIAGNOSIS — D500 Iron deficiency anemia secondary to blood loss (chronic): Secondary | ICD-10-CM | POA: Diagnosis not present

## 2022-12-21 DIAGNOSIS — Z9981 Dependence on supplemental oxygen: Secondary | ICD-10-CM | POA: Diagnosis not present

## 2022-12-21 DIAGNOSIS — I110 Hypertensive heart disease with heart failure: Secondary | ICD-10-CM | POA: Diagnosis not present

## 2022-12-21 DIAGNOSIS — I5032 Chronic diastolic (congestive) heart failure: Secondary | ICD-10-CM | POA: Diagnosis not present

## 2022-12-21 DIAGNOSIS — I4891 Unspecified atrial fibrillation: Secondary | ICD-10-CM | POA: Diagnosis not present

## 2022-12-25 DIAGNOSIS — I5033 Acute on chronic diastolic (congestive) heart failure: Secondary | ICD-10-CM | POA: Diagnosis not present

## 2022-12-25 DIAGNOSIS — D500 Iron deficiency anemia secondary to blood loss (chronic): Secondary | ICD-10-CM | POA: Diagnosis not present

## 2022-12-25 DIAGNOSIS — I110 Hypertensive heart disease with heart failure: Secondary | ICD-10-CM | POA: Diagnosis not present

## 2022-12-25 DIAGNOSIS — J9611 Chronic respiratory failure with hypoxia: Secondary | ICD-10-CM | POA: Diagnosis not present

## 2022-12-25 DIAGNOSIS — I480 Paroxysmal atrial fibrillation: Secondary | ICD-10-CM | POA: Diagnosis not present

## 2022-12-27 ENCOUNTER — Ambulatory Visit (HOSPITAL_COMMUNITY)
Admission: RE | Admit: 2022-12-27 | Discharge: 2022-12-27 | Disposition: A | Discharge status: Home / Self Care | Payer: Medicare Other | Source: Ambulatory Visit | Attending: Internal Medicine | Admitting: Internal Medicine

## 2022-12-27 VITALS — BP 144/64 | HR 80 | Ht 59.0 in | Wt 138.2 lb

## 2022-12-27 DIAGNOSIS — Z8249 Family history of ischemic heart disease and other diseases of the circulatory system: Secondary | ICD-10-CM | POA: Diagnosis not present

## 2022-12-27 DIAGNOSIS — D6869 Other thrombophilia: Secondary | ICD-10-CM | POA: Diagnosis not present

## 2022-12-27 DIAGNOSIS — I480 Paroxysmal atrial fibrillation: Secondary | ICD-10-CM | POA: Diagnosis not present

## 2022-12-27 DIAGNOSIS — J9611 Chronic respiratory failure with hypoxia: Secondary | ICD-10-CM | POA: Diagnosis not present

## 2022-12-27 DIAGNOSIS — Z7901 Long term (current) use of anticoagulants: Secondary | ICD-10-CM | POA: Insufficient documentation

## 2022-12-27 DIAGNOSIS — I10 Essential (primary) hypertension: Secondary | ICD-10-CM | POA: Insufficient documentation

## 2022-12-27 DIAGNOSIS — E785 Hyperlipidemia, unspecified: Secondary | ICD-10-CM | POA: Diagnosis not present

## 2022-12-27 DIAGNOSIS — I5033 Acute on chronic diastolic (congestive) heart failure: Secondary | ICD-10-CM | POA: Diagnosis not present

## 2022-12-27 DIAGNOSIS — I110 Hypertensive heart disease with heart failure: Secondary | ICD-10-CM | POA: Diagnosis not present

## 2022-12-27 DIAGNOSIS — I2510 Atherosclerotic heart disease of native coronary artery without angina pectoris: Secondary | ICD-10-CM | POA: Diagnosis not present

## 2022-12-27 DIAGNOSIS — D500 Iron deficiency anemia secondary to blood loss (chronic): Secondary | ICD-10-CM | POA: Diagnosis not present

## 2022-12-27 NOTE — Progress Notes (Signed)
Primary Care Physician: Thana Ates, MD Primary Cardiologist: Dr. Herbie Baltimore Primary Electrophysiologist: None Referring Physician: Carlos Levering, NP   [name] is a 89 y.o. female with a history of CAD s/p DES, HTN, HLD, colon cancer, anemia, and atrial fibrillation who presents for consultation in the [facility]. She was hospitalized due to Afib with RVR 1/26-28/24 and 2/5-7/24. Evaluated by PCP on 10/01/22 and found to be in Afib with RVR (unable to view ECG). Most recently seen by Cardiology on 4/4 and found to be in SR. Patient is on Eliquis 5 mg BID for a CHADS2VASC score of 5.  On evaluation 10/22/22, she is in SR currently. Daughter here with patient and notes that overall she generally does not do a lot of physical activity and this was the case prior to January diagnosis of Afib. She does not have cardiac awareness when she is in Afib. Currently on 2L via [place] but cannula is hanging on her ear. Daughter would like to know possible options for management of Afib considering mother still prefers to live by herself.   She is compliant with anticoagulation and has not missed any doses. She has no bleeding concerns.  On follow up 11/12/22, she is in NSR. She is s/p hospital admission 5/4-5/7 for acute on chronic diastolic CHF. She was placed on amiodarone 10/22/22 for AAD treatment of Afib. Patient stated since starting medication she has not felt good since then. Complained of feeling more short of breath and fatigued. Amiodarone was held in hospital due to symptomatic bradycardia. She was discharged on Lopressor 25 mg BID and diltiazem daily. Her creatinine in hospital was > 1.5 so discharged on Eliquis 2.5 mg BID. Recommended outpatient monitor to determine Afib burden.  On follow up 12/27/22, she is in NSR. S/p hospital admission 5/23-25/24 for acute on chronic diastolic CHF. She was continued on metoprolol, diltiazem, and Eliquis. She states since  discharge, she feels no better, no worse. She has remained compliant with Eliquis. Daughter states it is a challenge to get her to drink adequate amount of fluids. Review of notes from recent hospital admission demonstrate overall agreement that proceeding with rate control strategy is favorable over aggressive rhythm control.   Today, she denies symptoms of palpitations, chest pain, shortness of breath, orthopnea, PND, lower extremity edema, dizziness, presyncope, syncope, snoring, daytime somnolence, bleeding, or neurologic sequela. The patient is tolerating medications without difficulties and is otherwise without complaint today.   Atrial Fibrillation Risk Factors:  She uses oxygen via [place].  she does not have a history of rheumatic fever. she does not have a history of alcohol use. The patient does not have a history of early familial atrial fibrillation or other arrhythmias.  she has a BMI of Body mass index is 27.91 kg/m.[name] Kitchen Filed Weights   12/27/22 1529  Weight: 62.7 kg    Family History  Problem Relation Age of Onset   Pancreatic cancer Father    Prostate cancer Father    Skin cancer Father    Heart disease Mother    Hypertension Mother    Pancreatic cancer Brother    Heart disease Sister    Colon cancer Brother        ? Colostomy   Prostate cancer Brother    Colon cancer Sister    Melanoma Brother     Atrial Fibrillation Management history:  Previous antiarrhythmic drugs: amiodarone Previous cardioversions: None Previous ablations: None Anticoagulation history: Eliquis 2.5 mg BID  Past Medical History:  Diagnosis Date   Adenomatous colon polyp MAY 2013   HOSPITALIZED AT [facility] WITH HGB 7 -TRANSFUSED-MASS FOUND IN ASCENDING COLON   Aortic sclerosis    With murmur   Asthma    CAD S/P percutaneous coronary angioplasty 2002, 2003,2014   a) Unstable Angina 7/'02: 1st Cutting PTCA- OM2 --> restenosed 9/'02 NSTEMI--> 3.0 mm x 12 mm BMS-OM 2; b) 11/'03 DOE w/ +  Cardiolite - Cutter PTCA for ISR -- patent in 2004 (after False + Cardiolite); c) 07/2012 - Unstable Angina -- PCI to proximal RCA with Promus Premier DES 2.75 mm x 20 mm (3.0 mm), patent OM2 stent ~10% ISR (HARDING)   Complication of anesthesia 2009   HIP REPLACEMENT-PT HAD HARD TIME WAKING UP--FELT LIKE SHE COULDN'T BREATHE   Coronary stent restenosis due to scar tissue 05/2002   Cutting Balloon PTCA to OM2 BMS   Diverticulosis 10/2012   Seen on Colonoscopy   DJD (degenerative joint disease), thoracolumbar    Back, Hips & Knees   Essential hypertension    H/O Colon cancer 10/2011   GI - Drs. Leone Payor - colonoscopy 10/2012: diverticulosis, stable ileocolic anastomosis of R colon   Hyperlipidemia with target LDL less than 70    Internal hemorrhoids    Iron deficiency anemia due to chronic blood loss 10/2011   Blood Transfusion (EGD 09/2012 - unremarkable)   Medication intolerance - Plavix, Effient, Brilinta    Easy bruising, GI bleeds (led to Dx of Colon CA), Brilinta - dyspnea   Multiple lung nodules on CT 08/2012   scattered, bilateral but right>left.    Non-Q wave ST elevation myocardial infarction (STEMI) involving left circumflex coronary artery 03/2001   a) Severe thrombic ISR of prior PTCA site in OM2 --> BMS PCI; b) Echo 08/2012: Normal LV size & function.  EF 55-60%, no regional WMA, Gr 1 DD, mild MR, mild-mod TR - NO Pulmonary HTN   Osteoarthritis (arthritis due to wear and tear of joints)    Bilateral Hip Arthroplasty, L Knee TKA   Osteoporosis    Past Surgical History:  Procedure Laterality Date   APPENDECTOMY     CHOLECYSTECTOMY     COLONOSCOPY  11/16/2011   Procedure: COLONOSCOPY;  Surgeon: Iva Boop, MD;  Location: [facility] ENDOSCOPY;  Service: Endoscopy;  Laterality: N/A;   COLONOSCOPY  10/2012   Leone Payor: diverticulosis, stable R colon ileocolic anastomosis   COLONOSCOPY W/ BIOPSIES AND POLYPECTOMY  06/15/2008   adenomatous polyps, diverticulosis, internal hemorrhoids    COLONOSCOPY WITH PROPOFOL N/A 07/18/2015   Procedure: COLONOSCOPY WITH PROPOFOL;  Surgeon: Iva Boop, MD;  Location: [facility] ENDOSCOPY;  Service: Endoscopy;  Laterality: N/A;   CORONARY ANGIOPLASTY WITH STENT PLACEMENT  2002; 2003; 07/2012   a)  7/'02: 1st Cutting PTCA- OM2 --> restenosed 9/'02 --> 3.0 mm x 12 mm BMS-OM 2; b) 11/'03 - Cutter PTCA for ISR -- patent in 2004;    DILATATION & CURETTAGE/HYSTEROSCOPY WITH MYOSURE N/A 03/21/2022   Procedure: DILATATION & CURETTAGE/HYSTEROSCOPY WITH MYOSURE, PAP SMEAR;  Surgeon: Steva Ready, DO;  Location: [facility] OR;  Service: Gynecology;  Laterality: N/A;   ESOPHAGOGASTRODUODENOSCOPY N/A 10/23/2012   Procedure: ESOPHAGOGASTRODUODENOSCOPY (EGD);  Surgeon: Louis Meckel, MD;  Location: [facility] ENDOSCOPY;  Service: Endoscopy;  Laterality: N/A;   INCISIONAL HERNIA REPAIR N/A 01/29/2013   Procedure: LAPAROSCOPIC INCISIONAL HERNIA;  Surgeon: Shelly Rubenstein, MD;  Location: [facility] ORS;  Service: General;  Laterality: N/A;   INSERTION OF MESH N/A 01/29/2013  Procedure: INSERTION OF MESH;  Surgeon: Shelly Rubenstein, MD;  Location: [facility] ORS;  Service: General;  Laterality: N/A;   LAPAROSCOPY  03/21/2022   Procedure: DIAGNOSTIC LAPAROSCOPY;  Surgeon: Steva Ready, DO;  Location: [facility] OR;  Service: Gynecology;;   LEFT HEART CATHETERIZATION WITH CORONARY ANGIOGRAM N/A 07/15/2012   Procedure: LEFT HEART CATHETERIZATION WITH CORONARY ANGIOGRAM;  Surgeon: Marykay Lex, MD;  Location: [facility] CATH LAB: pRCA 80-90%, OM2 BMS ~10% ISR   NM MYOVIEW LTD  08/2014    Normal LV size and function. Normal wall motion. LOW RISK.   PERCUTANEOUS CORONARY STENT INTERVENTION (PCI-S)  07/15/2012   Procedure: PERCUTANEOUS CORONARY STENT INTERVENTION (PCI-S);  Surgeon: Marykay Lex, MD;  Location: [facility] CATH LAB: c) PCI to proximal RCA with Promus Premier DES 2.75 mm x 20 mm (3.0 mm)   REPLACEMENT TOTAL KNEE Left 2003   TOTAL HIP ARTHROPLASTY  09/08/2007   left, Dr. Lequita Halt   TOTAL HIP  ARTHROPLASTY Right 2003   TRANSTHORACIC ECHOCARDIOGRAM  08/2012   Normal LV size & function.  EF 55-60%, no regional WMA, Gr 1 DD, mild MR, mild-mod TR; aortic sclerosis without stenosis    Current Outpatient Medications  Medication Sig Dispense Refill   acetaminophen (TYLENOL) 500 MG tablet Take 500 mg by mouth in the morning and at bedtime.     albuterol (PROVENTIL HFA;VENTOLIN HFA) 108 (90 BASE) MCG/ACT inhaler Inhale 2 puffs into the lungs every 6 (six) hours as needed for wheezing.     apixaban (ELIQUIS) 2.5 MG TABS tablet Take 1 tablet (2.5 mg total) by mouth 2 (two) times daily. 60 tablet 0   diltiazem (CARDIZEM CD) 300 MG 24 hr capsule Take 1 capsule (300 mg total) by mouth daily. 30 capsule 1   escitalopram (LEXAPRO) 5 MG tablet Take 5 mg by mouth daily.     ferrous sulfate 325 (65 FE) MG EC tablet Take 1 tablet (325 mg total) by mouth daily with breakfast. 60 tablet 0   Fexofenadine HCl (ALLEGRA PO) Take 1 tablet by mouth every morning.     fluticasone (FLONASE) 50 MCG/ACT nasal spray Place 1 spray into the nose daily as needed for allergies.     furosemide (LASIX) 40 MG tablet Take 1.5 tablets (60 mg total) by mouth daily. 60 tablet 1   Melatonin 5 MG CHEW Chew 5 mg by mouth at bedtime as needed (sleep).     metoprolol tartrate (LOPRESSOR) 25 MG tablet Take 25 mg by mouth 2 (two) times daily.     nitroGLYCERIN (NITROSTAT) 0.4 MG SL tablet Place 1 tablet (0.4 mg total) under the tongue every 5 (five) minutes as needed for chest pain. 180 tablet 0   spironolactone (ALDACTONE) 25 MG tablet Take 0.5 tablets (12.5 mg total) by mouth daily. 30 tablet 0   No current facility-administered medications for this encounter.    Allergies  Allergen Reactions   Brilinta [Ticagrelor] Shortness Of Breath    Bleeding    Lipitor [Atorvastatin] Shortness Of Breath and Other (See Comments)    Myalgias    Plavix [Clopidogrel] Shortness Of Breath and Other (See Comments)    Bleeding    Codeine  Nausea And Vomiting   Mobic [Meloxicam] Other (See Comments)    Stomach upset   Paxil [Paroxetine] Other (See Comments)    Stomach upset Myalgias  Chills    Sulfa Antibiotics Diarrhea and Other (See Comments)    Stomach upset   Sulfonamide Derivatives Diarrhea and Other (See Comments)  Stomach upset    ROS- All systems are reviewed and negative except as per the HPI above.  Physical Exam: Vitals:   12/27/22 1529  Weight: 62.7 kg  Height: 4\' 11"  (1.499 m)    GEN- The patient is well appearing, alert and oriented x 3 today.   Head- normocephalic, atraumatic Eyes-  Sclera clear, conjunctiva pink Ears- hearing intact Lungs- Clear to ausculation bilaterally, normal work of breathing Heart- Regular rate and rhythm with ectopy noted, no murmurs, rubs or gallops, PMI not laterally displaced Extremities- no clubbing, cyanosis, or edema MS- no significant deformity or atrophy Skin- no rash or lesion Psych- euthymic mood, full affect Neuro- strength and sensation are intact   Wt Readings from Last 3 Encounters:  12/27/22 62.7 kg  11/24/22 60.9 kg  11/12/22 64.8 kg   EKG today demonstrates  Vent. rate 80 BPM PR interval * ms QRS duration 70 ms QT/QTcB 368/424 ms P-R-T axes * 55 45 NSR with PACs Nonspecific T wave abnormality Abnormal ECG When compared with ECG of 12-Nov-2022 15:58, PREVIOUS ECG IS PRESENT  Echo 11/23/22 demonstrated:  1. Left ventricular ejection fraction, by estimation, is 55 to 60%. The  left ventricle has normal function. The left ventricle has no regional  wall motion abnormalities. Left ventricular diastolic parameters are  consistent with Grade II diastolic  dysfunction (pseudonormalization).   2. Mildly D-shaped interventricular septum suggestive of a degree of RV  pressure/volume overload. Right ventricular systolic function is mildly  reduced. The right ventricular size is moderately enlarged. There is  moderately elevated pulmonary artery   systolic pressure. The estimated right ventricular systolic pressure is  56.4 mmHg.   3. Left atrial size was mildly dilated.   4. Right atrial size was mildly dilated.   5. The mitral valve is normal in structure. Mild mitral valve  regurgitation. No evidence of mitral stenosis.   6. The aortic valve is tricuspid. Aortic valve regurgitation is not  visualized. No aortic stenosis is present.   7. The inferior vena cava is normal in size with <50% respiratory  variability, suggesting right atrial pressure of 8 mmHg.   Cardiac monitor 5/13-27/24: Patch Wear Time:  13 days and 23 hours   Patient had a min HR of 48 bpm, max HR of 162 bpm, and avg HR of 74 bpm.  Predominant rhythm was sinus rhythm 2% atrial fibrillation burden, heart rate ranging from 59-143 bpm (avg of 84 bpm), the longest lasting 6 mins 14 secs with an avg rate of 80 bpm 10.8% supraventricular ectopy Less than 1% ventricular ectopy Multiple short runs of SVT, all less than 20 beats No patient triggered episodes  Epic records are reviewed at length today.  CHA2DS2-VASc Score = 5  The patient's score is based upon: CHF History: 0 HTN History: 1 Diabetes History: 0 Stroke History: 0 Vascular Disease History: 1 Age Score: 2 Gender Score: 1       ASSESSMENT AND PLAN: Paroxysmal Atrial Fibrillation (ICD10:  I48.0) The patient's CHA2DS2-VASc score is 5, indicating a 7.2% annual risk of stroke.    She is in NSR today with PACs. Baseline artifact makes p waves difficult to discern.  She had symptomatic bradycardia from amiodarone and this was discontinued. She is not a candidate for flecainide due to history of CAD. Previous discussion about medication treatments would include rate control as a primary strategy or Tikosyn load. Her estimated 23 CrCl with creatinine 1.53 on 11/06/22 would put her at lowest dose of 125  mcg BID. Cardiac monitor showed overall low Afib burden. After discussion, agree at this time rate  control strategy is favored.  Continue current medication regimen without change.    2. Secondary Hypercoagulable State (ICD10:  D68.69) The patient is at significant risk for stroke/thromboembolism based upon her CHA2DS2-VASc Score of 5.  Continue Apixaban (Eliquis).  No missed doses.    F/u as scheduled with Dr. Herbie Baltimore.    Lake Bells, PA-C Afib Clinic [facility] [address] [place], [place] [address] [phone] 12/27/2022 3:51 PM

## 2022-12-28 DIAGNOSIS — I480 Paroxysmal atrial fibrillation: Secondary | ICD-10-CM | POA: Diagnosis not present

## 2022-12-28 DIAGNOSIS — I5033 Acute on chronic diastolic (congestive) heart failure: Secondary | ICD-10-CM | POA: Diagnosis not present

## 2022-12-28 DIAGNOSIS — I110 Hypertensive heart disease with heart failure: Secondary | ICD-10-CM | POA: Diagnosis not present

## 2022-12-28 DIAGNOSIS — D500 Iron deficiency anemia secondary to blood loss (chronic): Secondary | ICD-10-CM | POA: Diagnosis not present

## 2022-12-28 DIAGNOSIS — J9611 Chronic respiratory failure with hypoxia: Secondary | ICD-10-CM | POA: Diagnosis not present

## 2023-01-06 DIAGNOSIS — I4891 Unspecified atrial fibrillation: Secondary | ICD-10-CM | POA: Diagnosis not present

## 2023-01-06 DIAGNOSIS — I5033 Acute on chronic diastolic (congestive) heart failure: Secondary | ICD-10-CM | POA: Diagnosis not present

## 2023-01-06 DIAGNOSIS — J45901 Unspecified asthma with (acute) exacerbation: Secondary | ICD-10-CM | POA: Diagnosis not present

## 2023-01-06 DIAGNOSIS — J9601 Acute respiratory failure with hypoxia: Secondary | ICD-10-CM | POA: Diagnosis not present

## 2023-01-22 ENCOUNTER — Encounter: Payer: Self-pay | Admitting: Cardiology

## 2023-01-22 ENCOUNTER — Ambulatory Visit: Payer: Medicare Other | Attending: Cardiology | Admitting: Cardiology

## 2023-01-22 VITALS — BP 126/54 | HR 73 | Ht 59.0 in | Wt 140.0 lb

## 2023-01-22 DIAGNOSIS — I480 Paroxysmal atrial fibrillation: Secondary | ICD-10-CM | POA: Diagnosis not present

## 2023-01-22 DIAGNOSIS — I214 Non-ST elevation (NSTEMI) myocardial infarction: Secondary | ICD-10-CM | POA: Diagnosis not present

## 2023-01-22 DIAGNOSIS — I5032 Chronic diastolic (congestive) heart failure: Secondary | ICD-10-CM

## 2023-01-22 DIAGNOSIS — D6869 Other thrombophilia: Secondary | ICD-10-CM | POA: Diagnosis not present

## 2023-01-22 DIAGNOSIS — I2510 Atherosclerotic heart disease of native coronary artery without angina pectoris: Secondary | ICD-10-CM

## 2023-01-22 DIAGNOSIS — E785 Hyperlipidemia, unspecified: Secondary | ICD-10-CM | POA: Diagnosis not present

## 2023-01-22 DIAGNOSIS — Z789 Other specified health status: Secondary | ICD-10-CM | POA: Diagnosis not present

## 2023-01-22 DIAGNOSIS — Z9861 Coronary angioplasty status: Secondary | ICD-10-CM | POA: Diagnosis not present

## 2023-01-22 DIAGNOSIS — I10 Essential (primary) hypertension: Secondary | ICD-10-CM

## 2023-01-22 MED ORDER — AMIODARONE HCL 200 MG PO TABS: ORAL_TABLET | ORAL | 6 refills | Status: DC

## 2023-01-22 NOTE — Patient Instructions (Addendum)
Medication Instructions:     If you feel short of breath or  heart fills fluttering or irregular - check your heartbeat with the Kardiamobile  machine.   If it states you are in atrial fibrillation-- take Amiodarone 200 mg twice a day for 2 days  ( do not take your Metoprolol during this time)  Restart after Metoprolol after the 2 day use of Amiodarone.    Take an additional furosemide 40 mg  every other day  until your heart rate is out of rhythm  up to 3 days this will keep you out of heart failure.   Call the office  in  2 days if heart stays in atrial fibrillation  ( we are trying to keep you out of the hospital)   Patient does not want to go to ER or urgent care for Afib please treat outpatient basis - per Dr Herbie Baltimore ( primary cardiology)  01/22/23  *If you need a refill on your cardiac medications before your next appointment, please call your pharmacy*   Lab Work:  Not needed   Testing/Procedures:  Not needed  Follow-Up: At [facility], you and your health needs are our priority.  As part of our continuing mission to provide you with exceptional heart care, we have created designated Provider Care Teams.  These Care Teams include your primary Cardiologist (physician) and Advanced Practice Providers (APPs -  Physician Assistants and Nurse Practitioners) who all work together to provide you with the care you need, when you need it.     Your next appointment:   6 week(s)  The format for your next appointment:   Virtual Visit   Provider:   Bryan Lemma, MD    Other Instructions  KardiaMobile [email or web address]        FDA-cleared, clinical grade mobile EKG monitor: Lourena Simmonds is the most clinically-validated mobile EKG used by the world's leading cardiac care medical professionals With Basic service, know instantly if your heart rhythm is normal or if atrial fibrillation is detected, and email the last single EKG recording to yourself or  your doctor Premium service, available for purchase through the Kardia app for $9.99 per month or $99 per year, includes unlimited history and storage of your EKG recordings, a monthly EKG summary report to share with your doctor, along with the ability to track your blood pressure, activity and weight Includes one KardiaMobile phone clip FREE SHIPPING: Standard delivery 1-3 business days. Orders placed by 11:00am PST will ship that afternoon. Otherwise, will ship next business day. All orders ship via [employer] from [place], [place]    [employer] - sending an EKG Download app and set up profile. Run EKG - by placing 1-2 fingers on the silver plates After EKG is complete - Download PDF  - Skip password (if you apply a password the provider will need it to view the EKG) Click share button (square with upward arrow) in bottom left corner To send: choose MyChart (first time log into MyChart)  Pop up window about sending ECG Click continue Choose type of message Choose provider Type subject and message Click send (EKG should be attached)  - To send additional EKGs in one message click the paperclip image and bottom of page to attach.

## 2023-01-22 NOTE — Assessment & Plan Note (Signed)
No longer on statin.  No longer monitoring.

## 2023-01-22 NOTE — Assessment & Plan Note (Signed)
Unfortunately, she does have issues with symptomatic bradycardia and therefore limited pelvic and treat.  We do need to have some type of breakthrough recommendations.  She did not tolerate amiodarone in the hospital during last visit, however I think for a short therapy may be reasonable.  She is already on Eliquis which is her [place] and allows her to potentially do cardioversion but that still requires hospital.  The recommendation now is to try to figure out something we will keep her from going to the hospital.  She does not have heart failure symptoms without A-fib.  Therefore need to minimize how long she is in A-fib. This would mean we need to wait to determine when she is in or out of A-fib since her symptoms will not tell.  Plan: Continue diltiazem and Lopressor for rate control currently. Purchase Kardia-Mobile monitor -> with changes of symptoms, reassess for A-fib ->  1 first diagnosis of recurrent A-fib -> will replace metoprolol with amiodarone to try to reestablish rhythm control. If remains in A-fib after 24 hours contact the office for additional information but the recommendation will not be to go to the ER unless she is overly symptomatic. She will also take additional doses of furosemide upon onset of A-fib. Continue Eliquis If unable to control rate or restore rhythm quickly, we could consider scheduling outpatient DCCV.   PATIENT INSTRUCTIONS    If you feel short of breath or  heart fills fluttering or irregular - check your heartbeat with the Kardiamobile  machine.   If it states you are in atrial fibrillation-- take Amiodarone 200 mg twice a day for 2 days  ( do not take your Metoprolol during this time)  Restart after Metoprolol after the 2 day use of Amiodarone.    Take an additional furosemide 40 mg  every other day  until your heart rate is out of rhythm  up to 3 days this will keep you out of heart failure.   Call the office  in  2 days if heart stays in atrial  fibrillation  ( we are trying to keep you out of the hospital)   Patient does not want to go to ER or urgent care for Afib please treat outpatient basis - per Dr Herbie Baltimore ( primary cardiology)  01/22/23

## 2023-01-22 NOTE — Assessment & Plan Note (Signed)
On Eliquis 2.5 mg twice daily with CHA2DS2-VASc score of 5.  Okay to hold for procedures or surgeries.

## 2023-01-22 NOTE — Assessment & Plan Note (Signed)
She clearly has diastolic dysfunction that is exacerbated by A-fib.  This leads to the recommendation that we would need to try to control the rhythm.  Not a good candidate for standing antiarrhythmic agent, but having something to use for an arrhythmia would be beneficial therefore recommending PRN amiodarone for short-term.  Would not be on it for more than a week.  Would also not use metoprolol plus amiodarone and diltiazem.  She remains on furosemide 60 mg daily, if she does not A-fib, she should add additional 40 mg. Continue spironolactone along with Lopressor and Cartia for rate control and BP control. I would not want her to be overly hypotensive therefore we will hold off on afterload reduction.  Otherwise, I do not want her to be there for me to encourage adequate hydration.

## 2023-01-22 NOTE — Assessment & Plan Note (Signed)
No active anginal symptoms, despite having A-fib intermittently CHF symptoms.  Now on blood pressure provide milligram twice daily, cardia and spironolactone, but not on statin.

## 2023-01-22 NOTE — Progress Notes (Signed)
Cardiology Office Note:  .   Date:  01/22/2023  ID:  [name], DOB Dec 18, 1927, MRN [id number] PCP: Thana Ates, MD  [facility] Providers Cardiologist:  Bryan Lemma, MD Cardiology APP:  Marcelino Duster, [place]     No chief complaint on file.   History of Present Illness: .     [name] is a  89 y.o. female with a PMH notable who presents here for hospital follow-up for A-fib and CHF at the request of [place], Ann Maki, MD.  Problem List Items Addressed This Visit     Cardiology Problems   S/P NSTEMI -- involving Left Circumflex Coronary Artery (OM2) In-stent Thrombosis (Chronic)   CAD S/P PCI: OM2 PTCA 7/02, OM2 Stent 9/02, ISR 11/03 - PTCA; RCA DES 1 /14/14 - Primary (Chronic)   LHC 01/01/2001 (unstable angina): Proximal OM1 90%.  PTCA with cutting balloon to OM2. LHC 03/02/2001 (NSTEMI): Ostial OM1 30%.  Proximal OM2 95%.  PCI with Cutting Balloon angioplasty and stent placement proximal OM2. LHC 05/22/2002 (unstable angina): In-stent restenosis proximal OM2.  PTCA with Cutting Balloon OM2. LHC 07/15/2012 (unstable angina): Ostial OM1 20%.  OM2 10% in-stent restenosis.  Proximal RCA 80 to 90%.  PCI with DES to proximal RCA. Nuclear Stress Test 09/28/2014: Normal study.   Chronic diastolic CHF (congestive heart failure) (HCC) Echo 07/29/2022: EF 60 to 65%. Grade II DD. Moderate MR. Moderate MAC. Aortic valve sclerosis without stenosis. Heart rhythm during study was sinus with very frequent PACs and frequent brief episodes of A. tach.    Paroxysmal A-fib (HCC) -> Hypercoagulable state due to paroxysmal atrial fibrillation [facility]) - Jan 2024   Hyperlipidemia with target LDL less than 70 (Chronic) Lipid Panel 09/26/2022: LDL 60, HDL 86, TG 140, total 170.    Essential hypertension (Chronic)   Other   Medication intolerance - Plavix, Effient, Brilinta (Chronic)    I have not seen Cherie Dark since Nov 2018.  1/26-28/24: Admitted with new onset A-fib RVR (sent to the ER  by [facility] after presenting with dyspnea -> noted to be A-fib RVR) => HR controlled on IV Diltiazem -> d/c on PO Diltiazem & Eliquis. 2/5-7/24: Readmitted for Acute on Chronic DHF, again in Afib RVR  [name] was last seen on 10/04/2022 by Carlos Levering, NP as a 35-month follow-up from seeing Joni Reining, NP.  Was doing well since leaving the hospital but was on 2 L of oxygen.  Had noticed some dyspnea because of pollen.  No PND, orthopnea or edema.  Had brisk diuresis with Lasix.  Not aware of A-fib. = EKG by PCP on 10/01/2022 for worsening dyspnea showed A-fib RVR-160 bpm.  Was in sinus rhythm rate 65 bpm with PACs and clinic visit.  Noted that she just simply has to move a little slower. Continued on Eliquis and diltiazem along with Lasix.  Referred to A-fib clinic.  Was started on amiodarone on 10/22/2022, but she had not felt well on that.    Subjective  INTERVAL HISTORY Most recent visit to A-fib clinic was 12/27/2022: In NSR.  S/p hospital admission 5/4-7 for Acute on Chronic DHF.  Amiodarone held due to bradycardia-discharged on Lopressor 25 mg twice daily, diltiazem and Eliquis.  Did not feel any better or worse since discharge.  Daughter noted that is a challenge for her to drink adequate fluids.  Overlying decision was to plan for rate control over aggressive rhythm control. => Intolerant of amiodarone, not  a candidate for flecainide due to CAD history.  Only option would be Tikosyn but concern based on borderline renal function. Admitted again 5/23-25/24 for acute on chronic DHF not noted to have significant volume overload.  Given 1 dose IV Lasix and converted back to p.o. Lasix.  Increased Lasix to 60 mg daily and added spironolactone 12.5 mg daily. => Also consulted palliative care.  Patient wishes to be DNR-wished for natural death.  [name] presents here for the first time that I have seen her for almost 6 years.  She is coming by her daughter who  does not play a major role in her care.  She is quite frustrated from the multiple different hospitalizations and is getting fed up.  They apparently have been working with palliative care in the hope and wishes for her to not go back to the hospital unless absolutely necessary.  She unfortunately is not really aware of her symptoms.  She does not know if she is in A-fib with until she gets extremely short of breath.  Unfortunately no contingency plan was upon her discharge Cornelius Moras any follow-ups to avoid these episodes. She does not know when she is in A-fib, she only has shortness of breath.  When I asked her her symptoms, she cannot tell me she looks to her daughter who then will say she does not seem like herself but she seems short of breath when I asked the patient she is not able to tell me what that means which indicates that she probably does have some memory issues which compounds the situation. It seems as though she gets a little bit volume overloaded when in A-fib and has CHF symptoms and has difficulty breathing.  She therefore goes in his diuresis rate controlled and discharged.  She oftentimes will pop out of A-fib which she currently is in sinus rhythm today.  But there is no plan.  Apparently she was started on amiodarone but did not tolerate, no plans for a as needed option was discussed.  Thankfully, she is not having chest pain or pressure with rest or exertion nothing to suggest angina.  She only has heart failure symptoms when she is in A-fib.  Otherwise no PND, orthopnea or edema.  She is relatively sedentary and probably does anything to especially since.  Patient does not want to do much.  She does have unsteady gait and therefore is relatively concerned about doing much walking.  She does not really want to go outside, and her family is reluctant for her to go outside.  Now that she is on Eliquis, she is not having bleeding issues.  She actually seems better.  Has been 2 months since  she has had an episode with the only difference being the addition of beta-blocker and spironolactone. She seems to be tolerating medications without difficulties.  Compliant today  ROS:  Cardiovascular ROS: no chest pain or dyspnea on exertion positive for - intermittent episodes of heart failure, but difficult to assess because she is very sedentary. negative for - chest pain, dyspnea on exertion, edema, irregular heartbeat, orthopnea, palpitations, paroxysmal nocturnal dyspnea, rapid heart rate, shortness of breath, or active dizziness, lightheadedness or wooziness.  Syncope/near syncope or TIA/CVA or almost fugax symptoms.  No claudication.  No melena, hematochezia hematuria epistaxis.  Review of Systems - Negative except fatigue, deconditioning, sedentary with exception of when she goes into A-fib.    Objective  Studies Reviewed: [name] Kitchen   EKG Interpretation Date/Time:  Tuesday January 22 2023 14:31:38 EDT Ventricular Rate:  73 PR Interval:  130 QRS Duration:  78 QT Interval:  386 QTC Calculation: 425 R Axis:   6  Text Interpretation: Normal sinus rhythm Normal ECG When compared with ECG of 27-Dec-2022 15:47, Sinus rhythm has replaced Atrial fibrillation Nonspecific T wave abnormality, improved in Lateral leads Confirmed by Bryan Lemma ([id number]) on 01/22/2023 2:33:27 PM   ECHO 11/23/2022: EF 55 to 60%.  No RWMA.  GR 2 DD.  IVS mildly D-shaped suggesting a degree of RV pressure and volume overload.  Mildly reduced RV function with estimated PAP of 56 millimercury.  However only mild LA and RA dilation.  RAP estimated 8 mmHg. 14-day Zio Patch Monitor June 2024: Predominantly sinus rhythm-HR range 48-108 bpm, avg 74 bpm. 2% A-fib burden HR range 59-143 bpm, Avg 84 bpm with longest episode being 37m 14s.  Frequent (7.9%) isolated PACs and occasional (2.7% and 1.2%) couplets and triplets.  Rare PACs PVCs.  767 Atrial Runs -> fastest was 5 beats with max rate 160 bpm.  Longest was 17 beats with an  average HR 101 bpm.  LABS 09/26/22  Risk Assessment/Calculations:    CHA2DS2-VASc Score = 5   This indicates a 7.2% annual risk of stroke. The patient's score is based upon: CHF History: 0 HTN History: 1 Diabetes History: 0 Stroke History: 0 Vascular Disease History: 1 Age Score: 2 Gender Score: 1             Physical Exam:   VS:  BP (!) 126/54 (BP Location: Right Arm, Patient Position: Sitting, Cuff Size: Normal)   Pulse 73   Ht 4\' 11"  (1.499 m)   Wt 140 lb (63.5 kg)   SpO2 94%   BMI 28.28 kg/m    Wt Readings from Last 3 Encounters:  01/22/23 140 lb (63.5 kg)  12/27/22 138 lb 3.2 oz (62.7 kg)  11/24/22 134 lb 4.2 oz (60.9 kg)    GEN: Well nourished, well developed in no acute distress; relatively healthy appearing.  Although she seems to be elderly and frail. NECK: No JVD; No carotid bruits CARDIAC: Normal S1, S2; RRR, no murmurs, rubs, gallops RESPIRATORY:  Clear to auscultation without rales, wheezing or rhonchi ; nonlabored, good air movement. ABDOMEN: Soft, non-tender, non-distended EXTREMITIES:  No edema; No deformity     ASSESSMENT AND PLAN: .    Problem List Items Addressed This Visit       Cardiology Problems   S/P NSTEMI -- involving left circumflex coronary artery (OM2) In-stent Thrombosis (Chronic)   Relevant Medications   amiodarone (PACERONE) 200 MG tablet   Other Relevant Orders   EKG 12-Lead (Completed)   Paroxysmal A-fib (HCC) - Primary (Chronic)    Unfortunately, she does have issues with symptomatic bradycardia and therefore limited pelvic and treat.  We do need to have some type of breakthrough recommendations.  She did not tolerate amiodarone in the hospital during last visit, however I think for a short therapy may be reasonable.  She is already on Eliquis which is her [place] and allows her to potentially do cardioversion but that still requires hospital.  The recommendation now is to try to figure out something we will keep her from going to  the hospital.  She does not have heart failure symptoms without A-fib.  Therefore need to minimize how long she is in A-fib. This would mean we need to wait to determine when she is in or out of A-fib since her symptoms will not  tell.  Plan: Continue diltiazem and Lopressor for rate control currently. Purchase Kardia-Mobile monitor -> with changes of symptoms, reassess for A-fib ->  1 first diagnosis of recurrent A-fib -> will replace metoprolol with amiodarone to try to reestablish rhythm control. If remains in A-fib after 24 hours contact the office for additional information but the recommendation will not be to go to the ER unless she is overly symptomatic. She will also take additional doses of furosemide upon onset of A-fib. Continue Eliquis If unable to control rate or restore rhythm quickly, we could consider scheduling outpatient DCCV.   PATIENT INSTRUCTIONS    If you feel short of breath or  heart fills fluttering or irregular - check your heartbeat with the Kardiamobile  machine.   If it states you are in atrial fibrillation-- take Amiodarone 200 mg twice a day for 2 days  ( do not take your Metoprolol during this time)  Restart after Metoprolol after the 2 day use of Amiodarone.    Take an additional furosemide 40 mg  every other day  until your heart rate is out of rhythm  up to 3 days this will keep you out of heart failure.   Call the office  in  2 days if heart stays in atrial fibrillation  ( we are trying to keep you out of the hospital)   Patient does not want to go to ER or urgent care for Afib please treat outpatient basis - per Dr Herbie Baltimore ( primary cardiology)  01/22/23      Relevant Medications   amiodarone (PACERONE) 200 MG tablet   Other Relevant Orders   EKG 12-Lead (Completed)   Hyperlipidemia with target LDL less than 70 (Chronic)    No longer on statin.  No longer monitoring.      Relevant Medications   amiodarone (PACERONE) 200 MG tablet   Other  Relevant Orders   EKG 12-Lead (Completed)   Hypercoagulable state due to paroxysmal atrial fibrillation (HCC) (Chronic)    On Eliquis 2.5 mg twice daily with CHA2DS2-VASc score of 5.  Okay to hold for procedures or surgeries.      Relevant Medications   amiodarone (PACERONE) 200 MG tablet   Other Relevant Orders   EKG 12-Lead (Completed)   Essential hypertension (Chronic)   Relevant Medications   amiodarone (PACERONE) 200 MG tablet   Other Relevant Orders   EKG 12-Lead (Completed)   Chronic diastolic CHF (congestive heart failure) (HCC) (Chronic)    She clearly has diastolic dysfunction that is exacerbated by A-fib.  This leads to the recommendation that we would need to try to control the rhythm.  Not a good candidate for standing antiarrhythmic agent, but having something to use for an arrhythmia would be beneficial therefore recommending PRN amiodarone for short-term.  Would not be on it for more than a week.  Would also not use metoprolol plus amiodarone and diltiazem.  She remains on furosemide 60 mg daily, if she does not A-fib, she should add additional 40 mg. Continue spironolactone along with Lopressor and Cartia for rate control and BP control. I would not want her to be overly hypotensive therefore we will hold off on afterload reduction.  Otherwise, I do not want her to be there for me to encourage adequate hydration.      Relevant Medications   amiodarone (PACERONE) 200 MG tablet   Other Relevant Orders   EKG 12-Lead (Completed)   CAD S/P PCI: OM2 PTCA 7/02, OM2 Stent 9/02,  ISR 11/03 - PTCA; RCA DES 1 /14/14 (Chronic)    No active anginal symptoms, despite having A-fib intermittently CHF symptoms.  Now on blood pressure provide milligram twice daily, cardia and spironolactone, but not on statin.      Relevant Medications   amiodarone (PACERONE) 200 MG tablet   Other Relevant Orders   EKG 12-Lead (Completed)     Other   Medication intolerance - Plavix, Effient,  Brilinta (Chronic)   Relevant Orders   EKG 12-Lead (Completed)            Dispo: Return in about 6 weeks (around 03/05/2023) for Routine Follow-up after testing ~ 1-2 months, Routine follow up with me.  Total time spent: 55 min spent with patient + 50 min spent charting = 105 min    Signed, Marykay Lex, MD, MS Bryan Lemma, M.D., M.S. Interventional Cardiologist  [facility]  Pager # [phone] Phone # [phone] [address] [place], [place] [address]

## 2023-01-29 NOTE — Telephone Encounter (Signed)
error 

## 2023-02-06 DIAGNOSIS — I4891 Unspecified atrial fibrillation: Secondary | ICD-10-CM | POA: Diagnosis not present

## 2023-02-06 DIAGNOSIS — I5033 Acute on chronic diastolic (congestive) heart failure: Secondary | ICD-10-CM | POA: Diagnosis not present

## 2023-02-06 DIAGNOSIS — J9601 Acute respiratory failure with hypoxia: Secondary | ICD-10-CM | POA: Diagnosis not present

## 2023-02-06 DIAGNOSIS — J45901 Unspecified asthma with (acute) exacerbation: Secondary | ICD-10-CM | POA: Diagnosis not present

## 2023-03-08 ENCOUNTER — Ambulatory Visit: Payer: Medicare Other | Attending: Cardiology | Admitting: Cardiology

## 2023-03-08 ENCOUNTER — Telehealth: Payer: Self-pay | Admitting: *Deleted

## 2023-03-08 DIAGNOSIS — I2510 Atherosclerotic heart disease of native coronary artery without angina pectoris: Secondary | ICD-10-CM

## 2023-03-08 DIAGNOSIS — I5032 Chronic diastolic (congestive) heart failure: Secondary | ICD-10-CM | POA: Diagnosis not present

## 2023-03-08 DIAGNOSIS — D6869 Other thrombophilia: Secondary | ICD-10-CM | POA: Diagnosis not present

## 2023-03-08 DIAGNOSIS — I480 Paroxysmal atrial fibrillation: Secondary | ICD-10-CM | POA: Diagnosis not present

## 2023-03-08 DIAGNOSIS — Z9861 Coronary angioplasty status: Secondary | ICD-10-CM

## 2023-03-08 MED ORDER — AMIODARONE HCL 200 MG PO TABS: 100.0000 mg | ORAL_TABLET | Freq: Every day | ORAL | 3 refills | Status: DC

## 2023-03-08 NOTE — Patient Instructions (Addendum)
Medication Instructions:   Stop taking Metoprolol    Start taking Amiodarone 100 mg  ( 1/2 tablet of 200 mg ) daily    If you have atrial fibrillation breakthrough - take 200 mg  twice a day  for 2 days  then return to regular dose.  While in atrial fibrillation take an extra dose of 40 mg lasix daily.    Sliding Scale - weigh daily   If weight is greater than 3 lbs from the night before take an extra 40 mg lasix dose.  *If you need a refill on your cardiac medications before your next appointment, please call your pharmacy*   Lab Work:  Not needed   Testing/Procedures: Not needed   Follow-Up: At [facility], you and your health needs are our priority.  As part of our continuing mission to provide you with exceptional heart care, we have created designated Provider Care Teams.  These Care Teams include your primary Cardiologist (physician) and Advanced Practice Providers (APPs -  Physician Assistants and Nurse Practitioners) who all work together to provide you with the care you need, when you need it.     Your next appointment:   2 month(s)  The format for your next appointment:   Virtual Visit   Provider:   Bryan Lemma, MD

## 2023-03-08 NOTE — Progress Notes (Unsigned)
  Cardiology Office Note:  .   Date:  03/08/2023  ID:  [name], DOB [birth year], MRN [id number] PCP: Mary Ates, MD  [facility] Providers Cardiologist:  Mary Lemma, MD Cardiology APP:  Mary Duster, [place] { Click to update primary MD,subspecialty MD or APP then REFRESH:1}    No chief complaint on file.   History of Present Illness: .     [name] is a *** 89 y.o. female *** with a PMH notable for *** who presents here for *** at the request of Mary Ates, MD.  [name] was last seen on ***     Subjective   INTERVAL HISTORY   ROS:  Cardiovascular ROS: {roscv:310661} Review of Systems - {ros master:310782}     Objective   Studies Reviewed: [name] Kitchen        ECHO: *** CATH: *** MONITOR: *** CT: ***  Risk Assessment/Calculations:   {Does this patient have ATRIAL FIBRILLATION?:[id number]} No BP recorded.  {Refresh Note OR Click here to enter BP  :1}***         Physical Exam:   VS:  There were no vitals taken for this visit.   Wt Readings from Last 3 Encounters:  01/22/23 140 lb (63.5 kg)  12/27/22 138 lb 3.2 oz (62.7 kg)  11/24/22 134 lb 4.2 oz (60.9 kg)    GEN: Well nourished, well developed in no acute distress; *** NECK: No JVD; No carotid bruits CARDIAC: Normal S1, S2; RRR, no murmurs, rubs, gallops RESPIRATORY:  Clear to auscultation without rales, wheezing or rhonchi ; nonlabored, good air movement. ABDOMEN: Soft, non-tender, non-distended EXTREMITIES:  No edema; No deformity      ASSESSMENT AND PLAN: .    Problem List Items Addressed This Visit   None       {Are you ordering a CV Procedure (e.g. stress test, cath, DCCV, TEE, etc)?   Press F2        :[id number]}   Dispo: No follow-ups on file.  Total time spent: *** min spent with patient + *** min spent charting = *** min  Signed, Mary Lex, MD, MS [name], M.D., M.S. Interventional Cardiologist  [facility]  Pager #  [phone] Phone # [phone] [address] [place], [place] [address]

## 2023-03-08 NOTE — Telephone Encounter (Signed)
Spoke patient 's daughter  [name], reviewed after visit summary .  Beverly verbalized understanding. Appointment schedule for May 10, 2023.

## 2023-03-09 ENCOUNTER — Encounter: Payer: Self-pay | Admitting: Cardiology

## 2023-03-09 DIAGNOSIS — J45901 Unspecified asthma with (acute) exacerbation: Secondary | ICD-10-CM | POA: Diagnosis not present

## 2023-03-09 DIAGNOSIS — I4891 Unspecified atrial fibrillation: Secondary | ICD-10-CM | POA: Diagnosis not present

## 2023-03-09 DIAGNOSIS — J9601 Acute respiratory failure with hypoxia: Secondary | ICD-10-CM | POA: Diagnosis not present

## 2023-03-09 DIAGNOSIS — I5033 Acute on chronic diastolic (congestive) heart failure: Secondary | ICD-10-CM | POA: Diagnosis not present

## 2023-03-09 NOTE — Assessment & Plan Note (Signed)
For the most part she is not having active heart failure symptoms.  It is mostly when she goes into A-fib and stays in it for a while unchecked she will start having symptoms.  Leery of using afterload reduction agents.  She is on diltiazem blood pressure pretty well-controlled.  Continue standing dose of furosemide 60 mg daily but take an additional 40 mg on days when she is in A-fib.  Also and sliding scale dosing of injection 40 mg tab where she to gain 3 pounds in 1 day.

## 2023-03-09 NOTE — Assessment & Plan Note (Signed)
Remains on Eliquis 2.5 mg twice daily with CHA2DS2-VASc score 6.   Okay to hold for procedures or surgeries for 24 to 72 hours depending on the nature of the procedure.  Standard procedures will be 48 hours.[name] Kitchen

## 2023-03-09 NOTE — Assessment & Plan Note (Signed)
No angina symptoms.  She is currently on diltiazem and metoprolol-because of some fatigue issues I wanted ahead and stop the metoprolol with plans to start standing dose of amiodarone.  Continue spironolactone.  Not on statin per patient request Not on aspirin or Plavix because of DOAC

## 2023-03-09 NOTE — Progress Notes (Deleted)
{Choose 1 Note Type (Telehealth Visit or Telephone Visit):[phone]}   Patient has given verbal permission to conduct this visit via virtual appointment and to bill insurance 03/09/2023 1:00 PM     Evaluation Performed:  Follow-up visit  Date:  03/09/2023   ID:  [name], DOB [birth year], MRN [id number]  {Patient Location:[phone]::"Home"} {Provider Location:[phone]::"Home Office"}  PCP:  Thana Ates, MD  Cardiologist:  Bryan Lemma, MD *** Electrophysiologist:  None   Chief Complaint:   No chief complaint on file.   ====================================  ASSESSMENT & PLAN:    Problem List Items Addressed This Visit   None   ====================================  History of Present Illness:    [name] is a 89 y.o. female with PMH notable for *** who presents via audio/video conferencing for a telehealth visit today as a ***.  [name] was last seen ***  Hospitalizations:  ***   Recent - Interim CV studies:   The following studies were reviewed today: ***:  Inerval History   ***  Cardiovascular ROS: {roscv:[id number]}   ROS:  Please see the history of present illness.     ROS  Past Medical History:  Diagnosis Date   Adenomatous colon polyp MAY 2013   HOSPITALIZED AT [facility] WITH HGB 7 -TRANSFUSED-MASS FOUND IN ASCENDING COLON   Aortic sclerosis    With murmur   Asthma    CAD S/P percutaneous coronary angioplasty 2002, 2003,2014   a) Unstable Angina 7/'02: 1st Cutting PTCA- OM2 --> restenosed 9/'02 NSTEMI--> 3.0 mm x 12 mm BMS-OM 2; b) 11/'03 DOE w/ + Cardiolite - Cutter PTCA for ISR -- patent in 2004 (after False + Cardiolite); c) 07/2012 - Unstable Angina -- PCI to proximal RCA with Promus Premier DES 2.75 mm x 20 mm (3.0 mm), patent OM2 stent ~10% ISR (Jasdeep Kepner)   Complication of anesthesia 2009   HIP REPLACEMENT-PT HAD HARD TIME WAKING UP--FELT LIKE SHE COULDN'T BREATHE   Coronary stent restenosis due to scar tissue 05/2002   Cutting  Balloon PTCA to OM2 BMS   Diverticulosis 10/2012   Seen on Colonoscopy   DJD (degenerative joint disease), thoracolumbar    Back, Hips & Knees   Essential hypertension    H/O Colon cancer 10/2011   GI - Drs. Leone Payor - colonoscopy 10/2012: diverticulosis, stable ileocolic anastomosis of R colon   Hyperlipidemia with target LDL less than 70    Internal hemorrhoids    Iron deficiency anemia due to chronic blood loss 10/2011   Blood Transfusion (EGD 09/2012 - unremarkable)   Medication intolerance - Plavix, Effient, Brilinta    Easy bruising, GI bleeds (led to Dx of Colon CA), Brilinta - dyspnea   Multiple lung nodules on CT 08/2012   scattered, bilateral but right>left.    Non-Q wave ST elevation myocardial infarction (STEMI) involving left circumflex coronary artery 03/2001   a) Severe thrombic ISR of prior PTCA site in OM2 --> BMS PCI; b) Echo 08/2012: Normal LV size & function.  EF 55-60%, no regional WMA, Gr 1 DD, mild MR, mild-mod TR - NO Pulmonary HTN   Osteoarthritis (arthritis due to wear and tear of joints)    Bilateral Hip Arthroplasty, L Knee TKA   Osteoporosis    Past Surgical History:  Procedure Laterality Date   APPENDECTOMY     CHOLECYSTECTOMY     COLONOSCOPY  11/16/2011   Procedure: COLONOSCOPY;  Surgeon: Iva Boop, MD;  Location[facility] ENDOSCOPY;  Service: Endoscopy;  Laterality: N/A;  COLONOSCOPY  10/2012   Gessner: diverticulosis, stable R colon ileocolic anastomosis   COLONOSCOPY W/ BIOPSIES AND POLYPECTOMY  06/15/2008   adenomatous polyps, diverticulosis, internal hemorrhoids   COLONOSCOPY WITH PROPOFOL N/A 07/18/2015   Procedure: COLONOSCOPY WITH PROPOFOL;  Surgeon: Iva Boop, MD;  Location: [facility] ENDOSCOPY;  Service: Endoscopy;  Laterality: N/A;   CORONARY ANGIOPLASTY WITH STENT PLACEMENT  2002; 2003; 07/2012   a)  7/'02: 1st Cutting PTCA- OM2 --> restenosed 9/'02 --> 3.0 mm x 12 mm BMS-OM 2; b) 11/'03 - Cutter PTCA for ISR -- patent in 2004;    DILATATION &  CURETTAGE/HYSTEROSCOPY WITH MYOSURE N/A 03/21/2022   Procedure: DILATATION & CURETTAGE/HYSTEROSCOPY WITH MYOSURE, PAP SMEAR;  Surgeon: Steva Ready, DO;  Location: [facility] OR;  Service: Gynecology;  Laterality: N/A;   ESOPHAGOGASTRODUODENOSCOPY N/A 10/23/2012   Procedure: ESOPHAGOGASTRODUODENOSCOPY (EGD);  Surgeon: Louis Meckel, MD;  Location: [facility] ENDOSCOPY;  Service: Endoscopy;  Laterality: N/A;   INCISIONAL HERNIA REPAIR N/A 01/29/2013   Procedure: LAPAROSCOPIC INCISIONAL HERNIA;  Surgeon: Shelly Rubenstein, MD;  Location: [facility] ORS;  Service: General;  Laterality: N/A;   INSERTION OF MESH N/A 01/29/2013   Procedure: INSERTION OF MESH;  Surgeon: Shelly Rubenstein, MD;  Location: [facility] ORS;  Service: General;  Laterality: N/A;   LAPAROSCOPY  03/21/2022   Procedure: DIAGNOSTIC LAPAROSCOPY;  Surgeon: Steva Ready, DO;  Location: [facility] OR;  Service: Gynecology;;   LEFT HEART CATHETERIZATION WITH CORONARY ANGIOGRAM N/A 07/15/2012   Procedure: LEFT HEART CATHETERIZATION WITH CORONARY ANGIOGRAM;  Surgeon: Marykay Lex, MD;  Location: [facility] CATH LAB: pRCA 80-90%, OM2 BMS ~10% ISR   NM MYOVIEW LTD  08/2014    Normal LV size and function. Normal wall motion. LOW RISK.   PERCUTANEOUS CORONARY STENT INTERVENTION (PCI-S)  07/15/2012   Procedure: PERCUTANEOUS CORONARY STENT INTERVENTION (PCI-S);  Surgeon: Marykay Lex, MD;  Location: [facility] CATH LAB: c) PCI to proximal RCA with Promus Premier DES 2.75 mm x 20 mm (3.0 mm)   REPLACEMENT TOTAL KNEE Left 2003   TOTAL HIP ARTHROPLASTY  09/08/2007   left, Dr. Lequita Halt   TOTAL HIP ARTHROPLASTY Right 2003   TRANSTHORACIC ECHOCARDIOGRAM  08/2012   Normal LV size & function.  EF 55-60%, no regional WMA, Gr 1 DD, mild MR, mild-mod TR; aortic sclerosis without stenosis     No outpatient medications have been marked as taking for the 03/08/23 encounter (Video Visit) with Marykay Lex, MD.     Allergies:   Brilinta [ticagrelor], Lipitor [atorvastatin], Plavix [clopidogrel],  Codeine, Mobic [meloxicam], Paxil [paroxetine], Sulfa antibiotics, and Sulfonamide derivatives   Social History   Tobacco Use   Smoking status: Former    Current packs/day: 0.00    Types: Cigarettes    Start date: 07/02/1952    Quit date: 07/02/1982    Years since quitting: 40.7   Smokeless tobacco: Never  Vaping Use   Vaping status: Never Used  Substance Use Topics   Alcohol use: Yes    Alcohol/week: 0.0 standard drinks of alcohol    Comment: occ wine   Drug use: No     Family Hx: The patient's family history includes Colon cancer in her brother and sister; Heart disease in her mother and sister; Hypertension in her mother; Melanoma in her brother; Pancreatic cancer in her brother and father; Prostate cancer in her brother and father; Skin cancer in her father.   Labs/Other Tests and Data Reviewed:    EKG:  {NGE:[id number]}  Recent Labs: 07/27/2022: TSH  1.806 11/22/2022: B Natriuretic Peptide 632.1 11/23/2022: ALT 14; Magnesium 2.1 11/24/2022: BUN 15; Creatinine, Ser 1.09; Hemoglobin 9.2; Platelets 243; Potassium 3.8; Sodium 138   Recent Lipid Panel Lab Results  Component Value Date/Time   CHOL 122 10/23/2012 06:12 AM   TRIG 116 10/23/2012 06:12 AM   HDL 67 10/23/2012 06:12 AM   CHOLHDL 1.8 10/23/2012 06:12 AM   LDLCALC 32 10/23/2012 06:12 AM    Wt Readings from Last 3 Encounters:  01/22/23 140 lb (63.5 kg)  12/27/22 138 lb 3.2 oz (62.7 kg)  11/24/22 134 lb 4.2 oz (60.9 kg)     Objective:    Vital Signs:  There were no vitals taken for this visit.  {[facility]Care Virtual Exam (Optional):[phone]::"VITAL SIGNS:  reviewed"}   ==========================================  COVID-19 Education: The signs and symptoms of COVID-19 were discussed with the patient and how to seek care for testing (follow up with PCP or arrange E-visit).   The importance of social distancing was discussed today.  Time:   Today, I have spent *** minutes with the patient with telehealth  technology discussing the above problems.   An additional ***minutes spent charting (reviewing prior notes, hospital records, studies, labs etc.) Total ***minutes   Medication Adjustments/Labs and Tests Ordered: Current medicines are reviewed at length with the patient today.  Concerns regarding medicines are outlined above.   Patient Instructions  Medication Instructions:   Stop taking Metoprolol    Start taking Amiodarone 100 mg  ( 1/2 tablet of 200 mg ) daily    If you have atrial fibrillation breakthrough - take 200 mg  twice a day  for 2 days  then return to regular dose.  While in atrial fibrillation take an extra dose of 40 mg lasix daily.    Sliding Scale - weigh daily   If weight is greater than 3 lbs from the night before take an extra 40 mg lasix dose.  *If you need a refill on your cardiac medications before your next appointment, please call your pharmacy*   Lab Work:  Not needed   Testing/Procedures: Not needed   Follow-Up: At [facility], you and your health needs are our priority.  As part of our continuing mission to provide you with exceptional heart care, we have created designated Provider Care Teams.  These Care Teams include your primary Cardiologist (physician) and Advanced Practice Providers (APPs -  Physician Assistants and Nurse Practitioners) who all work together to provide you with the care you need, when you need it.     Your next appointment:   2 month(s)  The format for your next appointment:   Virtual Visit   Provider:   Bryan Lemma, MD      Signed, Bryan Lemma, MD  03/09/2023 1:00 PM    [facility]

## 2023-03-09 NOTE — Assessment & Plan Note (Signed)
She is not symptomatic per se but if she stays in A-fib long enough, she will develop heart failure symptoms.  We therefore tried a PRN pill in the pocket method with their Kardia-Mobile being the monitor to determine if she has a breakthrough spell.  It seems like she is pretty much staying on a cycle of using amiodarone just but every week.  As such I think we can probably try to get away with a maintenance dose of amiodarone without load, and discontinue metoprolol.  Plan: Continue diltiazem but stop metoprolol Start taking amiodarone 100 mg (1/2 of 200 mg tablet) daily Continue with current plan to monitor with Kardia-Mobile on a daily basis and treat breakthrough spells similar to before with amiodarone 200 mg twice daily x 2 days in addition to taking an extra dose of Lasix for those 2 days.

## 2023-03-25 DIAGNOSIS — G8929 Other chronic pain: Secondary | ICD-10-CM | POA: Diagnosis not present

## 2023-03-25 DIAGNOSIS — I480 Paroxysmal atrial fibrillation: Secondary | ICD-10-CM | POA: Diagnosis not present

## 2023-03-25 DIAGNOSIS — M542 Cervicalgia: Secondary | ICD-10-CM | POA: Diagnosis not present

## 2023-03-25 DIAGNOSIS — Z9981 Dependence on supplemental oxygen: Secondary | ICD-10-CM | POA: Diagnosis not present

## 2023-03-25 DIAGNOSIS — I130 Hypertensive heart and chronic kidney disease with heart failure and stage 1 through stage 4 chronic kidney disease, or unspecified chronic kidney disease: Secondary | ICD-10-CM | POA: Diagnosis not present

## 2023-03-25 DIAGNOSIS — I5032 Chronic diastolic (congestive) heart failure: Secondary | ICD-10-CM | POA: Diagnosis not present

## 2023-03-25 DIAGNOSIS — I129 Hypertensive chronic kidney disease with stage 1 through stage 4 chronic kidney disease, or unspecified chronic kidney disease: Secondary | ICD-10-CM | POA: Diagnosis not present

## 2023-04-23 ENCOUNTER — Telehealth: Payer: Self-pay | Admitting: Cardiology

## 2023-04-23 NOTE — Telephone Encounter (Signed)
No appear to be nice if she had Kardia-Mobile to show if there is truly is A-fib.  Otherwise reasonable to try to break A-fib with amiodarone increase.

## 2023-04-23 NOTE — Telephone Encounter (Signed)
amiodarone (PACERONE) 200 MG tablet 100 mg, Daily         Summary: Take 0.5 tablets (100 mg total) by mouth daily. If breakthrough Afib occur take 200 mg twice a day for 2 days as needed for new onset of atrial fib. ( See instructions), Starting Fri 03/08/2023, Normal      She verbalized understanding of the instructions and she has already given extra dose at lunch time. She was just worried about her hr being 110. Explained to her that it is not uncommon for the hr to be increased when in Afib. The patient is not symptomatic at this time. Instructed her to continue to monitor hr and monitor for s/s. Given ER precautions. Informed her that I would send this information to her provider and we will get back in touch if any further recommendations. She verbalized understanding.

## 2023-04-23 NOTE — Telephone Encounter (Signed)
Spoke to daughter [name]. She stated they did purchase Kardia- Mobile  The information  earlier was after she used  the [place]- [employer] stated the monitor showed ( "possible afib")   RN informed patient of Dr Herbie Baltimore comment. Daughter voiced understanding.

## 2023-04-23 NOTE — Telephone Encounter (Signed)
STAT if HR is under 50 or over 120 (normal HR is 60-100 beats per minute)  What is your heart rate?  110  Do you have a log of your heart rate readings (document readings)?   Yes  Do you have any other symptoms?  No  Daughter [name]) stated patient's HR reading was over 100 today and patient stated she has been "feeling achy".  Daughter reports she administered additional dosage of amiodarone (PACERONE) 200 MG tablet today at lunchtime.  Daughter stated can leave voice message and she will call right back.

## 2023-05-09 NOTE — Progress Notes (Unsigned)
Cardiology Telehealth note:  [name] Kitchen    Virtual Visit via Video Note   Because of [name]'s co-morbid illnesses, she is at least at moderate risk for complications without adequate follow up.  This format is felt to be most appropriate for this patient at this time.  All issues noted in this document were discussed and addressed.  A limited physical exam was performed with this format.  Please refer to the patient's chart for her consent to telehealth for [name].  -> The last 10 minutes of the visit was spent using telephone for audio with the video only working on the computer.  However the visit remained audiovisual.    Patient has given verbal permission to conduct this visit via virtual appointment and to bill insurance 05/09/2023 5:59 PM     Evaluation Performed:  Follow-up visit  Date:  05/11/2023  ID:  [name], DOB [birth year], MRN [id number] PCP: Thana Ates, MD  [facility] Providers Cardiologist:  Bryan Lemma, MD Cardiology APP:  Marcelino Duster, [place] {  Patient Location: Home Provider Location: Office/Clinic  Chief Complaint  Patient presents with   Follow-up    Very difficult for the family to get her out of the house and to a visit, therefore we are monitoring intermittently with telehealth visits.   Atrial Fibrillation   Patient Profile: .     [name] is a 89 y.o. female with a PMH reviewed below who presents here for 60-month virtual follow-up at the request of Thana Ates, MD.    Cardiology Problems    S/P NSTEMI -- involving Left Circumflex Coronary Artery (OM2) In-stent Thrombosis (Chronic)    CAD S/P PCI: OM2 PTCA 7/02, OM2 Stent 9/02, ISR 11/03 - PTCA; RCA DES 1 /14/14 - Primary (Chronic)    LHC 01/01/2001 (unstable angina): Proximal OM1 90%.  PTCA with cutting balloon to OM2. LHC 03/02/2001 (NSTEMI): Ostial OM1 30%.  Proximal OM2 95%.  PCI with Cutting Balloon angioplasty and stent placement proximal OM2. LHC 05/22/2002  (unstable angina): In-stent restenosis proximal OM2.  PTCA with Cutting Balloon OM2. LHC 07/15/2012 (unstable angina): Ostial OM1 20%.  OM2 10% in-stent restenosis.  Proximal RCA 80 to 90%.  PCI with DES to proximal RCA. Nuclear Stress Test 09/28/2014: Normal study.    Chronic diastolic CHF (congestive heart failure) (HCC) Echo 07/29/2022: EF 60 to 65%. Grade II DD. Moderate MR. Moderate MAC. Aortic valve sclerosis without stenosis. Heart rhythm during study was sinus with very frequent PACs and frequent brief episodes of A. tach.     Paroxysmal A-fib (HCC) -> Hypercoagulable state due to paroxysmal atrial fibrillation [facility]) - Jan 2024 Maintenance amiodarone 100 mg daily Plan for PRN pill in the pocket amiodarone load plus Lasix for breakthrough spells of A-fib using Kardia-Mobile to determine if she is in A-fib.    Hyperlipidemia with target LDL less than 70 (Chronic) Lipid Panel 09/26/2022: LDL 60, HDL 86, TG 140, total 170.     Essential hypertension (Chronic)    Other    Medication intolerance - Plavix, Effient, Brilinta (Chronic)     Admitted January 2024 with new onset A-fib RVR and heart failure and then readmitted in February again in A-fib RVR Became oxygen dependent Difficulty with A-fib management and heart failure Readmitted in May and June 2024 for A-fib and heart failure. => After this, she and her family decided that they were tired of going back and forth to the ER/hospital and wanted  to try to figure out ways to manage this in the outpatient setting. ->  Unfortunately, she did not tolerate amiodarone load, so we opted for pill in the pocket amiodarone using Kardia-Mobile to determine if she is in A-fib.  Plan will be to use amiodarone plus Lasix for breakthrough spells. Pill in the pocket for breakthrough A-fib: 200 mg twice daily amiodarone plus Lasix for the 2 days. Clinic visit January 22, 2023 for hospital follow-up for A-fib and CHF.  Doing better.  Plan for outpatient virtual  follow-up.  [name] was last seen on 03/09/2023 and she noted that [facility] for episodes where they had to use the amiodarone and Lasix.  Each time she chemically converted very quickly only once that she had to take the full 2 days of meds.  Otherwise she does not notice if she is going in and out of A-fib unless the use the [place].  In doing this technique, we have avoided her developing the A-fib after being in A-fib for several days.  Otherwise no symptoms.  No bleeding issues. => Because of several breakthrough episodes we decided to put her back on a standing low-dose amiodarone and continue the pill in the pocket method.  Subjective  History provided mostly by her daughter [name]. Discussed the use of AI scribe software for clinical note transcription with the patient, who gave verbal consent to proceed.  History of Present Illness   The patient, a 89 year old with a history of atrial fibrillation (AFib), heart failure, and hypertension, has been managing her AFib episodes at home with the help of her family and a portable EKG device (KardiaMobile). Over the past few weeks, she experienced at least two episodes of AFib, which were managed with amiodarone and Lasix as per her established home protocol. The patient's family reports that these episodes were successfully resolved within a day or two of initiating the medication regimen.  The patient is also on daily Lasix (60mg ) and spironolactone for heart failure management. She has not reported any new symptoms such as chest pain or shortness of breath. However, she has been feeling more tired than usual and has been sleeping a lot during the day. Despite this, she remains active around the house, performing light chores and personal care tasks.  The patient's family has been diligent in monitoring her heart rhythm daily with the KardiaMobile device and administering the amiodarone and Lasix protocol as needed. This approach has been  successful in managing the patient's AFib episodes at home, preventing hospital admissions for the past six months. The patient's family feels comfortable with this management plan and reports no significant issues.      ROS:  Review of Systems - Negative except as noted above-remains somewhat sedentary.  Does not do much.  However no real active heart failure symptoms.  Does not feel the A-fib, only noted with Kardia-Mobile.    Objective  Current Meds  Medication Sig   acetaminophen (TYLENOL) 500 MG tablet Take 500 mg by mouth in the morning and at bedtime.   albuterol (PROVENTIL HFA;VENTOLIN HFA) 108 (90 BASE) MCG/ACT inhaler Inhale 2 puffs into the lungs every 6 (six) hours as needed for wheezing.   amiodarone (PACERONE) 200 MG tablet Take 0.5 tablets (100 mg total) by mouth daily. If breakthrough Afib occur take 200 mg twice a day for 2 days as needed for new onset of atrial fib. ( See instructions)   apixaban (ELIQUIS) 2.5 MG TABS tablet Take 1 tablet (2.5 mg  total) by mouth 2 (two) times daily.   escitalopram (LEXAPRO) 5 MG tablet Take 5 mg by mouth daily.   ferrous sulfate 325 (65 FE) MG EC tablet Take 1 tablet (325 mg total) by mouth daily with breakfast.   Fexofenadine HCl (ALLEGRA PO) Take 1 tablet by mouth every morning.   furosemide (LASIX) 40 MG tablet Take 1.5 tablets (60 mg total) by mouth daily.   Melatonin 5 MG CHEW Chew 5 mg by mouth at bedtime as needed (sleep).   spironolactone (ALDACTONE) 25 MG tablet Take 0.5 tablets (12.5 mg total) by mouth daily.   Studies Reviewed: [name] Kitchen       No new studies  Risk Assessment/Calculations:    CHA2DS2-VASc Score = 6   This indicates a 9.7% annual risk of stroke. The patient's score is based upon: CHF History: 1 HTN History: 1 Diabetes History: 0 Stroke History: 0 Vascular Disease History: 1 Age Score: 2 Gender Score: 1   On DOAC      Physical Exam:   VS:  Ht 4\' 11"  (1.499 m)   Wt 143 lb (64.9 kg)   BMI 28.88 kg/m    Wt  Readings from Last 3 Encounters:  05/10/23 143 lb (64.9 kg)  01/22/23 140 lb (63.5 kg)  12/27/22 138 lb 3.2 oz (62.7 kg)    GEN: Well nourished, well developed in no acute distress; wearing home O2 RESPIRATORY: non-labored, good air movement.    ASSESSMENT AND PLAN: .    Problem List Items Addressed This Visit       Cardiology Problems   CAD S/P PCI: OM2 PTCA 7/02, OM2 Stent 9/02, ISR 11/03 - PTCA; RCA DES 1 /14/14 - Primary (Chronic)    Doing remarkably well.  No active anginal symptoms.  In fact she is relatively sedentary and therefore we would not truly know if she is having anginal symptoms.  She is asymptomatic with A-fib with fast heart rates.  Plan: Continue to monitor.  No plans for invasive or not evasive evaluation. Not on beta-blocker because of amiodarone. Not on aspirin or Plavix because of low-dose Eliquis for A-fib.      Relevant Medications   amiodarone (PACERONE) 200 MG tablet   Chronic diastolic CHF (congestive heart failure) (HCC) (Chronic)    Mostly notes symptoms when she has prolonged/persistent paroxysms of A-fib.  We are therefore managing with PRN bolus dosing of amiodarone +3 days of additional Lasix and maintaining stable symptoms.  Avoiding ER visits. No current symptoms. Managed with Lasix 60mg  daily (with PRN dosing for edema or A-fib) and spironolactone 12.5 mg. -Continue current medication regimen. -Monitor for signs of fluid overload (ankle swelling, weight gain).      Relevant Medications   amiodarone (PACERONE) 200 MG tablet   Essential hypertension (Chronic)    BP has been stable with spironolactone 12.5 mg and furosemide as the only antiplatelet agents.      Relevant Medications   amiodarone (PACERONE) 200 MG tablet   Hypercoagulable state due to paroxysmal atrial fibrillation (HCC) (Chronic)    Remains on Eliquis 2.5 mg twice daily given CHA2DS2-VASc score of 6.-Appropriately dosed for age weight and renal function.  Not likely she  will have to have procedures or surgeries, but would be okay to hold Eliquis 2 to 3 days preop for surgeries or procedures.      Relevant Medications   amiodarone (PACERONE) 200 MG tablet   Paroxysmal A-fib (HCC) (Chronic)    Recurrent episodes managed with amiodarone and Lasix.  Episodes are being detected early with KardiaMobile and managed at home, preventing hospital admissions. No associated symptoms of chest pain or shortness of breath. -Continue current management plan with amiodarone and Lasix as directed during episodes. -Adjust amiodarone prescription to reflect current usage (one tablet daily as directed). -Continue daily EKG monitoring with KardiaMobile.      Relevant Medications   amiodarone (PACERONE) 200 MG tablet   S/P NSTEMI -- involving left circumflex coronary artery (OM2) In-stent Thrombosis (Chronic)   Relevant Medications   amiodarone (PACERONE) 200 MG tablet    General Health Maintenance Patient is 89 years old, homebound but not sedentary. No current complaints. -Continue current level of activity as tolerated.       Follow-Up: Return in about 3 months (around 08/10/2023) for 3-4 month follow-up, Followup with Telemedicine. -Check-in after the new year (February 2025) to assess status. -Use MyChart for non-urgent communication and medication queries.   Total time spent: 27 min spent with patient + 14 min spent charting = 41 min    Signed, Marykay Lex, MD, MS Bryan Lemma, M.D., M.S. Interventional Cardiologist  [facility]  Pager # [phone] Phone # [phone] [address] [place], [place] [address]

## 2023-05-10 ENCOUNTER — Telehealth: Payer: Self-pay

## 2023-05-10 ENCOUNTER — Encounter: Payer: Self-pay | Admitting: Cardiology

## 2023-05-10 ENCOUNTER — Ambulatory Visit: Payer: Medicare Other | Attending: Cardiovascular Disease | Admitting: Cardiology

## 2023-05-10 VITALS — Ht 59.0 in | Wt 143.0 lb

## 2023-05-10 DIAGNOSIS — I10 Essential (primary) hypertension: Secondary | ICD-10-CM | POA: Diagnosis not present

## 2023-05-10 DIAGNOSIS — Z9861 Coronary angioplasty status: Secondary | ICD-10-CM

## 2023-05-10 DIAGNOSIS — I214 Non-ST elevation (NSTEMI) myocardial infarction: Secondary | ICD-10-CM | POA: Diagnosis not present

## 2023-05-10 DIAGNOSIS — I2510 Atherosclerotic heart disease of native coronary artery without angina pectoris: Secondary | ICD-10-CM

## 2023-05-10 DIAGNOSIS — I480 Paroxysmal atrial fibrillation: Secondary | ICD-10-CM | POA: Diagnosis not present

## 2023-05-10 DIAGNOSIS — D6869 Other thrombophilia: Secondary | ICD-10-CM

## 2023-05-10 DIAGNOSIS — I5032 Chronic diastolic (congestive) heart failure: Secondary | ICD-10-CM | POA: Diagnosis not present

## 2023-05-10 MED ORDER — AMIODARONE HCL 200 MG PO TABS: 100.0000 mg | ORAL_TABLET | Freq: Every day | ORAL | 3 refills | Status: DC

## 2023-05-10 NOTE — Telephone Encounter (Signed)
  Patient Consent for Virtual Visit        [name] has provided verbal consent on 05/10/2023 for a virtual visit (video or telephone).   CONSENT FOR VIRTUAL VISIT FOR:  [name]  By participating in this virtual visit I agree to the following:  I hereby voluntarily request, consent and authorize [facility] and its employed or contracted physicians, physician assistants, nurse practitioners or other licensed health care professionals (the Practitioner), to provide me with telemedicine health care services (the "Services") as deemed necessary by the treating Practitioner. I acknowledge and consent to receive the Services by the Practitioner via telemedicine. I understand that the telemedicine visit will involve communicating with the Practitioner through live audiovisual communication technology and the disclosure of certain medical information by electronic transmission. I acknowledge that I have been given the opportunity to request an in-person assessment or other available alternative prior to the telemedicine visit and am voluntarily participating in the telemedicine visit.  I understand that I have the right to withhold or withdraw my consent to the use of telemedicine in the course of my care at any time, without affecting my right to future care or treatment, and that the Practitioner or I may terminate the telemedicine visit at any time. I understand that I have the right to inspect all information obtained and/or recorded in the course of the telemedicine visit and may receive copies of available information for a reasonable fee.  I understand that some of the potential risks of receiving the Services via telemedicine include:  Delay or interruption in medical evaluation due to technological equipment failure or disruption; Information transmitted may not be sufficient (e.g. poor resolution of images) to allow for appropriate medical decision making by the Practitioner;  and/or  In rare instances, security protocols could fail, causing a breach of personal health information.  Furthermore, I acknowledge that it is my responsibility to provide information about my medical history, conditions and care that is complete and accurate to the best of my ability. I acknowledge that Practitioner's advice, recommendations, and/or decision may be based on factors not within their control, such as incomplete or inaccurate data provided by me or distortions of diagnostic images or specimens that may result from electronic transmissions. I understand that the practice of medicine is not an exact science and that Practitioner makes no warranties or guarantees regarding treatment outcomes. I acknowledge that a copy of this consent can be made available to me via my patient portal [facility] MyChart), or I can request a printed copy by calling the office of [facility].    I understand that my insurance will be billed for this visit.   I have read or had this consent read to me. I understand the contents of this consent, which adequately explains the benefits and risks of the Services being provided via telemedicine.  I have been provided ample opportunity to ask questions regarding this consent and the Services and have had my questions answered to my satisfaction. I give my informed consent for the services to be provided through the use of telemedicine in my medical care

## 2023-05-10 NOTE — Patient Instructions (Signed)
Medication Instructions:   Continue Amiodarone --may take additional dose as directed for afib  *If you need a refill on your cardiac medications before your next appointment, please call your pharmacy*   Lab Work: Not needed If you have labs (blood work) drawn today and your tests are completely normal, you will receive your results only by: MyChart Message (if you have MyChart) OR A paper copy in the mail If you have any lab test that is abnormal or we need to change your treatment, we will call you to review the results.   Testing/Procedures: Not needed   Follow-Up: At [facility], you and your health needs are our priority.  As part of our continuing mission to provide you with exceptional heart care, we have created designated Provider Care Teams.  These Care Teams include your primary Cardiologist (physician) and Advanced Practice Providers (APPs -  Physician Assistants and Nurse Practitioners) who all work together to provide you with the care you need, when you need it.     Your next appointment:   3 month(s)  The format for your next appointment:   Virtual Visit   Provider:   Bryan Lemma, MD    Other Instructions

## 2023-05-11 ENCOUNTER — Encounter: Payer: Self-pay | Admitting: Cardiology

## 2023-05-11 NOTE — Assessment & Plan Note (Signed)
BP has been stable with spironolactone 12.5 mg and furosemide as the only antiplatelet agents.

## 2023-05-11 NOTE — Assessment & Plan Note (Signed)
Mostly notes symptoms when she has prolonged/persistent paroxysms of A-fib.  We are therefore managing with PRN bolus dosing of amiodarone +3 days of additional Lasix and maintaining stable symptoms.  Avoiding ER visits. No current symptoms. Managed with Lasix 60mg  daily (with PRN dosing for edema or A-fib) and spironolactone 12.5 mg. -Continue current medication regimen. -Monitor for signs of fluid overload (ankle swelling, weight gain).

## 2023-05-11 NOTE — Assessment & Plan Note (Signed)
Doing remarkably well.  No active anginal symptoms.  In fact she is relatively sedentary and therefore we would not truly know if she is having anginal symptoms.  She is asymptomatic with A-fib with fast heart rates.  Plan: Continue to monitor.  No plans for invasive or not evasive evaluation. Not on beta-blocker because of amiodarone. Not on aspirin or Plavix because of low-dose Eliquis for A-fib.

## 2023-05-11 NOTE — Assessment & Plan Note (Signed)
Remains on Eliquis 2.5 mg twice daily given CHA2DS2-VASc score of 6.-Appropriately dosed for age weight and renal function.  Not likely she will have to have procedures or surgeries, but would be okay to hold Eliquis 2 to 3 days preop for surgeries or procedures.

## 2023-05-11 NOTE — Assessment & Plan Note (Signed)
Recurrent episodes managed with amiodarone and Lasix. Episodes are being detected early with [employer] and managed at home, preventing hospital admissions. No associated symptoms of chest pain or shortness of breath. -Continue current management plan with amiodarone and Lasix as directed during episodes. -Adjust amiodarone prescription to reflect current usage (one tablet daily as directed). -Continue daily EKG monitoring with [employer].

## 2023-06-11 ENCOUNTER — Telehealth: Payer: Self-pay | Admitting: Cardiology

## 2023-06-11 NOTE — Telephone Encounter (Signed)
Yes recommend that she just do daily amiodarone and daily Lasix until she is no longer in A-fib.  If she were to remain in A-fib after 2 more days, we can potentially schedule cardioversion provide she has not missed any doses of anticoagulation.  Will probably need to have her seen by an APP at least virtually if necessary to schedule this.   Bryan Lemma, MD

## 2023-06-11 NOTE — Telephone Encounter (Signed)
Received permission to speak to pt's daughter, [name], about patient's health concern. Mrs. York stated that per cardio mobile EKG, patient has been in atrial fibrillation for 4 days. Medication regime states to give patient Amiodarone 200 mg when pt in afib and extra dose of lasix to prevent heart failure. Mrs.York stated she followed regime and patient is still in afib. Mrs. York denied patient having fluid retention symptoms, no weight gain recorded on daily weight checks. Pt a little tired but that is her norm according to daughter. I asked about HR and daughter stated it has been in the 80s while in afib.Patient daughter asked if she should administer extra amiodarone or lasix. Advised daughter that amiodarone does not act right away and since the patient's hr is stable in 80s, to monitor patient for any new symptoms of afib and to reach out to our office if anything changes. Forwarded message to Dr. Herbie Baltimore to further advise.

## 2023-06-11 NOTE — Telephone Encounter (Signed)
Pt c/o medication issue:  1. Name of Medication: amiodarone (PACERONE) 200 MG tablet   2. How are you currently taking this medication (dosage and times per day)?  Take 0.5 tablets (100 mg total) by mouth daily. If breakthrough Afib occur take 200 mg twice a day for 2 days as needed for new onset of atrial fib. ( See instructions)       3. Are you having a reaction (difficulty breathing--STAT)? No  4. What is your medication issue? Pt's sister is requesting a callback regarding this medication not working recently so she'd like to know if she should continue to take this medication or not. Please advise.

## 2023-06-12 NOTE — Telephone Encounter (Signed)
Spoke with pt daughter, [name]. Recommendations given to [facility], she does not know if mother is still in afib. Appointment scheduled for tomorrow 06/13/23 at 10:05 to evaluate pt heart rhythm. [name] is aware and verbalized understanding.

## 2023-06-13 ENCOUNTER — Encounter: Payer: Self-pay | Admitting: Nurse Practitioner

## 2023-06-13 ENCOUNTER — Ambulatory Visit: Payer: Medicare Other | Attending: Nurse Practitioner | Admitting: Nurse Practitioner

## 2023-06-13 VITALS — BP 132/64 | HR 84 | Ht 59.0 in | Wt 144.8 lb

## 2023-06-13 DIAGNOSIS — I480 Paroxysmal atrial fibrillation: Secondary | ICD-10-CM | POA: Diagnosis not present

## 2023-06-13 DIAGNOSIS — I5032 Chronic diastolic (congestive) heart failure: Secondary | ICD-10-CM

## 2023-06-13 DIAGNOSIS — I2510 Atherosclerotic heart disease of native coronary artery without angina pectoris: Secondary | ICD-10-CM | POA: Diagnosis not present

## 2023-06-13 DIAGNOSIS — E785 Hyperlipidemia, unspecified: Secondary | ICD-10-CM | POA: Diagnosis not present

## 2023-06-13 DIAGNOSIS — I10 Essential (primary) hypertension: Secondary | ICD-10-CM

## 2023-06-13 NOTE — Progress Notes (Signed)
Office Visit    Patient Name: [name] Date of Encounter: 06/13/2023  Primary Care Provider:  Thana Ates, MD Primary Cardiologist:  Bryan Lemma, MD  Chief Complaint    89 year old female with a history of CAD s/p PTCA-OM2 in 12/2000, DES-OM2 in 03/2001, PTCA-OM2 in 2003 (ISR), DES-RCA in 07/2012, paroxysmal atrial fibrillation, chronic diastolic heart failure, hypertension, and hyperlipidemia who presents for follow-up related to atrial fibrillation.  Past Medical History    Past Medical History:  Diagnosis Date   Adenomatous colon polyp MAY 2013   HOSPITALIZED AT [facility] WITH HGB 7 -TRANSFUSED-MASS FOUND IN ASCENDING COLON   Aortic sclerosis    With murmur   Asthma    CAD S/P percutaneous coronary angioplasty 2002, 2003,2014   a) Unstable Angina 7/'02: 1st Cutting PTCA- OM2 --> restenosed 9/'02 NSTEMI--> 3.0 mm x 12 mm BMS-OM 2; b) 11/'03 DOE w/ + Cardiolite - Cutter PTCA for ISR -- patent in 2004 (after False + Cardiolite); c) 07/2012 - Unstable Angina -- PCI to proximal RCA with Promus Premier DES 2.75 mm x 20 mm (3.0 mm), patent OM2 stent ~10% ISR (HARDING)   Complication of anesthesia 2009   HIP REPLACEMENT-PT HAD HARD TIME WAKING UP--FELT LIKE SHE COULDN'T BREATHE   Coronary stent restenosis due to scar tissue 05/2002   Cutting Balloon PTCA to OM2 BMS   Diverticulosis 10/2012   Seen on Colonoscopy   DJD (degenerative joint disease), thoracolumbar    Back, Hips & Knees   Essential hypertension    H/O Colon cancer 10/2011   GI - Drs. Leone Payor - colonoscopy 10/2012: diverticulosis, stable ileocolic anastomosis of R colon   Hyperlipidemia with target LDL less than 70    Internal hemorrhoids    Iron deficiency anemia due to chronic blood loss 10/2011   Blood Transfusion (EGD 09/2012 - unremarkable)   Medication intolerance - Plavix, Effient, Brilinta    Easy bruising, GI bleeds (led to Dx of Colon CA), Brilinta - dyspnea   Multiple lung nodules on CT 08/2012   scattered,  bilateral but right>left.    Non-Q wave ST elevation myocardial infarction (STEMI) involving left circumflex coronary artery 03/2001   a) Severe thrombic ISR of prior PTCA site in OM2 --> BMS PCI; b) Echo 08/2012: Normal LV size & function.  EF 55-60%, no regional WMA, Gr 1 DD, mild MR, mild-mod TR - NO Pulmonary HTN   Osteoarthritis (arthritis due to wear and tear of joints)    Bilateral Hip Arthroplasty, L Knee TKA   Osteoporosis    Past Surgical History:  Procedure Laterality Date   APPENDECTOMY     CHOLECYSTECTOMY     COLONOSCOPY  11/16/2011   Procedure: COLONOSCOPY;  Surgeon: Iva Boop, MD;  Location: [facility] ENDOSCOPY;  Service: Endoscopy;  Laterality: N/A;   COLONOSCOPY  10/2012   Leone Payor: diverticulosis, stable R colon ileocolic anastomosis   COLONOSCOPY W/ BIOPSIES AND POLYPECTOMY  06/15/2008   adenomatous polyps, diverticulosis, internal hemorrhoids   COLONOSCOPY WITH PROPOFOL N/A 07/18/2015   Procedure: COLONOSCOPY WITH PROPOFOL;  Surgeon: Iva Boop, MD;  Location: [facility] ENDOSCOPY;  Service: Endoscopy;  Laterality: N/A;   CORONARY ANGIOPLASTY WITH STENT PLACEMENT  2002; 2003; 07/2012   a)  7/'02: 1st Cutting PTCA- OM2 --> restenosed 9/'02 --> 3.0 mm x 12 mm BMS-OM 2; b) 11/'03 - Cutter PTCA for ISR -- patent in 2004;    DILATATION & CURETTAGE/HYSTEROSCOPY WITH MYOSURE N/A 03/21/2022   Procedure: DILATATION & CURETTAGE/HYSTEROSCOPY WITH MYOSURE,  PAP SMEAR;  Surgeon: Steva Ready, DO;  Location: [facility] OR;  Service: Gynecology;  Laterality: N/A;   ESOPHAGOGASTRODUODENOSCOPY N/A 10/23/2012   Procedure: ESOPHAGOGASTRODUODENOSCOPY (EGD);  Surgeon: Louis Meckel, MD;  Location: [facility] ENDOSCOPY;  Service: Endoscopy;  Laterality: N/A;   INCISIONAL HERNIA REPAIR N/A 01/29/2013   Procedure: LAPAROSCOPIC INCISIONAL HERNIA;  Surgeon: Shelly Rubenstein, MD;  Location: [facility] ORS;  Service: General;  Laterality: N/A;   INSERTION OF MESH N/A 01/29/2013   Procedure: INSERTION OF MESH;  Surgeon: Shelly Rubenstein, MD;  Location: [facility] ORS;  Service: General;  Laterality: N/A;   LAPAROSCOPY  03/21/2022   Procedure: DIAGNOSTIC LAPAROSCOPY;  Surgeon: Steva Ready, DO;  Location: [facility] OR;  Service: Gynecology;;   LEFT HEART CATHETERIZATION WITH CORONARY ANGIOGRAM N/A 07/15/2012   Procedure: LEFT HEART CATHETERIZATION WITH CORONARY ANGIOGRAM;  Surgeon: Marykay Lex, MD;  Location: [facility] CATH LAB: pRCA 80-90%, OM2 BMS ~10% ISR   NM MYOVIEW LTD  08/2014    Normal LV size and function. Normal wall motion. LOW RISK.   PERCUTANEOUS CORONARY STENT INTERVENTION (PCI-S)  07/15/2012   Procedure: PERCUTANEOUS CORONARY STENT INTERVENTION (PCI-S);  Surgeon: Marykay Lex, MD;  Location: [facility] CATH LAB: c) PCI to proximal RCA with Promus Premier DES 2.75 mm x 20 mm (3.0 mm)   REPLACEMENT TOTAL KNEE Left 2003   TOTAL HIP ARTHROPLASTY  09/08/2007   left, Dr. Lequita Halt   TOTAL HIP ARTHROPLASTY Right 2003   TRANSTHORACIC ECHOCARDIOGRAM  08/2012   Normal LV size & function.  EF 55-60%, no regional WMA, Gr 1 DD, mild MR, mild-mod TR; aortic sclerosis without stenosis    Allergies  Allergies  Allergen Reactions   Brilinta [Ticagrelor] Shortness Of Breath    Bleeding    Lipitor [Atorvastatin] Shortness Of Breath and Other (See Comments)    Myalgias    Plavix [Clopidogrel] Shortness Of Breath and Other (See Comments)    Bleeding    Codeine Nausea And Vomiting   Mobic [Meloxicam] Other (See Comments)    Stomach upset   Paxil [Paroxetine] Other (See Comments)    Stomach upset Myalgias  Chills    Sulfa Antibiotics Diarrhea and Other (See Comments)    Stomach upset   Sulfonamide Derivatives Diarrhea and Other (See Comments)    Stomach upset     Labs/Other Studies Reviewed    The following studies were reviewed today:  Cardiac Studies & Procedures   CARDIAC CATHETERIZATION  CARDIAC CATHETERIZATION 07/15/2012    ECHOCARDIOGRAM  ECHOCARDIOGRAM COMPLETE 11/23/2022  Narrative ECHOCARDIOGRAM  REPORT    Patient Name:   AVEN PURINTON Date of Exam: 11/23/2022 Medical Rec #:  [id number]      Height:       59.0 in Accession #:    [id number]     Weight:       132.1 lb Date of Birth:  10/16/1927      BSA:          1.546 m Patient Age:    89 years       BP:           141/67 mmHg Patient Gender: F              HR:           74 bpm. Exam Location:  Inpatient  Procedure: 2D Echo, Color Doppler and Cardiac Doppler  Indications:    CHF  History:        Patient has prior history of Echocardiogram  examinations, most recent 07/29/2022. CHF, CAD and Previous Myocardial Infarction, Arrythmias:Atrial Fibrillation; Risk Factors:Dyslipidemia and Hypertension.  Sonographer:    [facility] Referring Phys: [id number] PREETHA JOSEPH  IMPRESSIONS   1. Left ventricular ejection fraction, by estimation, is 55 to 60%. The left ventricle has normal function. The left ventricle has no regional wall motion abnormalities. Left ventricular diastolic parameters are consistent with Grade II diastolic dysfunction (pseudonormalization). 2. Mildly D-shaped interventricular septum suggestive of a degree of RV pressure/volume overload. Right ventricular systolic function is mildly reduced. The right ventricular size is moderately enlarged. There is moderately elevated pulmonary artery systolic pressure. The estimated right ventricular systolic pressure is 56.4 mmHg. 3. Left atrial size was mildly dilated. 4. Right atrial size was mildly dilated. 5. The mitral valve is normal in structure. Mild mitral valve regurgitation. No evidence of mitral stenosis. 6. The aortic valve is tricuspid. Aortic valve regurgitation is not visualized. No aortic stenosis is present. 7. The inferior vena cava is normal in size with <50% respiratory variability, suggesting right atrial pressure of 8 mmHg.  FINDINGS Left Ventricle: Left ventricular ejection fraction, by estimation, is 55 to 60%. The left ventricle has normal function. The left  ventricle has no regional wall motion abnormalities. The left ventricular internal cavity size was normal in size. There is no left ventricular hypertrophy. Left ventricular diastolic parameters are consistent with Grade II diastolic dysfunction (pseudonormalization).  Right Ventricle: Mildly D-shaped interventricular septum suggestive of a degree of RV pressure/volume overload. The right ventricular size is moderately enlarged. No increase in right ventricular wall thickness. Right ventricular systolic function is mildly reduced. There is moderately elevated pulmonary artery systolic pressure. The tricuspid regurgitant velocity is 3.48 m/s, and with an assumed right atrial pressure of 8 mmHg, the estimated right ventricular systolic pressure is 56.4 mmHg.  Left Atrium: Left atrial size was mildly dilated.  Right Atrium: Right atrial size was mildly dilated.  Pericardium: There is no evidence of pericardial effusion.  Mitral Valve: The mitral valve is normal in structure. There is mild calcification of the mitral valve leaflet(s). Mild mitral annular calcification. Mild mitral valve regurgitation. No evidence of mitral valve stenosis.  Tricuspid Valve: The tricuspid valve is normal in structure. Tricuspid valve regurgitation is trivial.  Aortic Valve: The aortic valve is tricuspid. Aortic valve regurgitation is not visualized. No aortic stenosis is present.  Pulmonic Valve: The pulmonic valve was normal in structure. Pulmonic valve regurgitation is mild.  Aorta: The aortic root is normal in size and structure.  Venous: The inferior vena cava is normal in size with less than 50% respiratory variability, suggesting right atrial pressure of 8 mmHg.  IAS/Shunts: No atrial level shunt detected by color flow Doppler.   LEFT VENTRICLE PLAX 2D LVIDd:         3.70 cm LVIDs:         2.70 cm LV PW:         1.00 cm LV IVS:        1.10 cm LVOT diam:     1.80 cm LVOT Area:     2.54 cm  LV  Volumes (MOD) LV vol d, MOD A2C: 43.6 ml LV vol d, MOD A4C: 45.9 ml LV vol s, MOD A2C: 21.1 ml LV vol s, MOD A4C: 20.0 ml LV SV MOD A2C:     22.5 ml LV SV MOD A4C:     45.9 ml LV SV MOD BP:      24.6 ml  RIGHT VENTRICLE RV Basal diam:  3.90 cm RV Mid diam:    3.70 cm RV S prime:     10.80 cm/s TAPSE (M-mode): 1.5 cm  LEFT ATRIUM             Index        RIGHT ATRIUM           Index LA diam:        3.50 cm 2.26 cm/m   RA Area:     17.30 cm LA Vol (A2C):   44.2 ml 28.59 ml/m  RA Volume:   47.00 ml  30.40 ml/m LA Vol (A4C):   36.4 ml 23.54 ml/m LA Biplane Vol: 39.7 ml 25.68 ml/m  AORTA Ao Root diam: 2.70 cm  TRICUSPID VALVE TR Peak grad:   48.4 mmHg TR Vmax:        348.00 cm/s  SHUNTS Systemic Diam: 1.80 cm  Dalton McleanMD Electronically signed by Wilfred Lacy Signature Date/Time: 11/23/2022/2:01:33 PM    Final   MONITORS  LONG TERM MONITOR (3-14 DAYS) 12/04/2022  Narrative Patch Wear Time:  13 days and 23 hours  Patient had a min HR of 48 bpm, max HR of 162 bpm, and avg HR of 74 bpm. Predominant rhythm was sinus rhythm 2% atrial fibrillation burden, heart rate ranging from 59-143 bpm (avg of 84 bpm), the longest lasting 6 mins 14 secs with an avg rate of 80 bpm 10.8% supraventricular ectopy Less than 1% ventricular ectopy Multiple short runs of SVT, all less than 20 beats No patient triggered episodes          Recent Labs: 07/27/2022: TSH 1.806 11/22/2022: B Natriuretic Peptide 632.1 11/23/2022: ALT 14; Magnesium 2.1 11/24/2022: BUN 15; Creatinine, Ser 1.09; Hemoglobin 9.2; Platelets 243; Potassium 3.8; Sodium 138  Recent Lipid Panel    Component Value Date/Time   CHOL 122 10/23/2012 0612   TRIG 116 10/23/2012 0612   HDL 67 10/23/2012 0612   CHOLHDL 1.8 10/23/2012 0612   VLDL 23 10/23/2012 0612   LDLCALC 32 10/23/2012 0612    History of Present Illness    89 year old female with the above past medical history including CAD s/p PTCA-OM2 in  12/2000, DES-OM2 in 03/2001, PTCA-OM2 in 2003 (ISR), DES-RCA in 07/2012, paroxysmal atrial fibrillation, chronic diastolic heart failure, hypertension, and hyperlipidemia.  She has a history of CAD multiple prior interventions as above.  Lexiscan Myoview in 2016 was negative for ischemia.  Most recent echocardiogram in 10/2022 showed EF 55 to 60%, normal LV function, no RWMA, G2 DD, mildly reduced RV systolic function, moderately enlarged RV, moderately elevated PASP, mild mitral valve regurgitation.  She has a history of paroxysmal atrial fibrillation, and has been evaluated in the A-fib clinic.  Cardiac monitor 12/2022 showed predominantly sinus rhythm, 2% A-fib burden, PACs (10.8% burden), rare PVCs, multiple short runs of SVT, all less than 20 beats, no patient triggered episodes.  She is on chronic anticoagulation with Eliquis.  She was last seen in the office on 01/22/2023 was stable from a cardiac standpoint.  She was started on low-dose amiodarone with recommendations to increase amiodarone dosing as needed for atrial fibrillation.  She was seen virtually on 05/10/2023 and was stable from a cardiac standpoint.  She contacted our office on 06/11/2023 with concern for persistent atrial fibrillation.  She was advised to increase her amiodarone to 200 mg twice daily x 2 days.  It was noted that should she have persistent atrial fibrillation despite increased amiodarone therapy, she may require DCCV.  She presents  today for follow-up accompanied by her daughter.  Since her last visit and since she contacted our office she has been stable overall from a cardiac standpoint.  She has had notification of possible atrial fibrillation on her home Kardia mobile device.  She denies any chest pain, dyspnea, palpitations, edema, PND, orthopnea, weight gain.  She took amiodarone 200 mg twice daily x 2 days.  She is now back on her amiodarone 100 mg daily.    Home Medications    Current Outpatient Medications  Medication  Sig Dispense Refill   acetaminophen (TYLENOL) 500 MG tablet Take 500 mg by mouth in the morning and at bedtime.     albuterol (PROVENTIL HFA;VENTOLIN HFA) 108 (90 BASE) MCG/ACT inhaler Inhale 2 puffs into the lungs every 6 (six) hours as needed for wheezing.     amiodarone (PACERONE) 200 MG tablet Take 0.5 tablets (100 mg total) by mouth daily. If breakthrough Afib occur take 200 mg twice a day for 2 days as needed for new onset of atrial fib. ( See instructions) 80 tablet 3   apixaban (ELIQUIS) 2.5 MG TABS tablet Take 1 tablet (2.5 mg total) by mouth 2 (two) times daily. 60 tablet 0   escitalopram (LEXAPRO) 5 MG tablet Take 5 mg by mouth daily.     ferrous sulfate 325 (65 FE) MG EC tablet Take 1 tablet (325 mg total) by mouth daily with breakfast. 60 tablet 0   Fexofenadine HCl (ALLEGRA PO) Take 1 tablet by mouth every morning.     fluticasone (FLONASE) 50 MCG/ACT nasal spray Place 1 spray into the nose daily as needed for allergies.     furosemide (LASIX) 40 MG tablet Take 1.5 tablets (60 mg total) by mouth daily. 60 tablet 1   Melatonin 5 MG CHEW Chew 5 mg by mouth at bedtime as needed (sleep).     nitroGLYCERIN (NITROSTAT) 0.4 MG SL tablet Place 1 tablet (0.4 mg total) under the tongue every 5 (five) minutes as needed for chest pain. 180 tablet 0   spironolactone (ALDACTONE) 25 MG tablet Take 0.5 tablets (12.5 mg total) by mouth daily. 30 tablet 0   diltiazem (CARDIZEM CD) 300 MG 24 hr capsule Take 1 capsule (300 mg total) by mouth daily. 30 capsule 1   metoprolol tartrate (LOPRESSOR) 25 MG tablet Take 25 mg by mouth 2 (two) times daily. (Patient not taking: Reported on 06/13/2023)     No current facility-administered medications for this visit.     Review of Systems    She denies chest pain, palpitations, dyspnea, pnd, orthopnea, n, v, dizziness, syncope, edema, weight gain, or early satiety. All other systems reviewed and are otherwise negative except as noted above.   Physical Exam     VS:  BP 132/64   Pulse 84   Ht 4\' 11"  (1.499 m)   Wt 144 lb 12.8 oz (65.7 kg)   SpO2 97%   BMI 29.25 kg/m   GEN: Well nourished, well developed, in no acute distress. HEENT: normal. Neck: Supple, no JVD, carotid bruits, or masses. Cardiac: RRR, no murmurs, rubs, or gallops. No clubbing, cyanosis, edema.  Radials/DP/PT 2+ and equal bilaterally.  Respiratory:  Respirations regular and unlabored, clear to auscultation bilaterally. GI: Soft, nontender, nondistended, BS + x 4. MS: no deformity or atrophy. Skin: warm and dry, no rash. Neuro:  Strength and sensation are intact. Psych: Normal affect.  Accessory Clinical Findings    ECG personally reviewed by me today - EKG Interpretation Date/Time:  Thursday June 13 2023 10:20:25 EST Ventricular Rate:  84 PR Interval:    QRS Duration:  98 QT Interval:  388 QTC Calculation: 458 R Axis:   -38  Text Interpretation: Sinus rhythm with frequent Premature atrial complexes Left axis deviation Moderate voltage criteria for LVH, may be normal variant ( R in aVL , Cornell product ) Confirmed by Bernadene Person ([id number]) on 06/13/2023 10:35:47 AM  - no acute changes.   Lab Results  Component Value Date   WBC 8.7 11/24/2022   HGB 9.2 (L) 11/24/2022   HCT 29.5 (L) 11/24/2022   MCV 91.9 11/24/2022   PLT 243 11/24/2022   Lab Results  Component Value Date   CREATININE 1.09 (H) 11/24/2022   BUN 15 11/24/2022   NA 138 11/24/2022   K 3.8 11/24/2022   CL 101 11/24/2022   CO2 28 11/24/2022   Lab Results  Component Value Date   ALT 14 11/23/2022   AST 18 11/23/2022   ALKPHOS 50 11/23/2022   BILITOT 1.3 (H) 11/23/2022   Lab Results  Component Value Date   CHOL 122 10/23/2012   HDL 67 10/23/2012   LDLCALC 32 10/23/2012   TRIG 116 10/23/2012   CHOLHDL 1.8 10/23/2012    Lab Results  Component Value Date   HGBA1C 6.5 (H) 07/14/2012    Assessment & Plan    1. Paroxysmal atrial fibrillation: Cardiac monitor 12/2022 showed  predominantly sinus rhythm, 2% A-fib burden, PACs (10.8% burden), rare PVCs, multiple short runs of SVT, all less than 20 beats, no patient triggered episodes. Amiodarone was recently increased to 200 mg twice daily x 2 days. EKG today reviewed with Dr. Herbie Baltimore, shows sinus rhythm with frequent PACs.  It was noted that should she have persistent atrial fibrillation despite increased amiodarone therapy, she may benefit from DCCV. Per Dr. Herbie Baltimore, will continue amiodarone 200 mg daily x1 week, followed by amiodarone 100 mg daily.  Will check CMET, CBC, TSH and magnesium today for routine monitoring.  Per Dr. Herbie Baltimore, she has been instructed to take Lasix 60 mg daily with an additional 40 mg every other day as needed for up to 3 days for breakthrough atrial fibrillation. Reviewed ED precautions. Continue diltiazem, Eliquis.   2. CAD: S/p PTCA-OM2 in 12/2000, DES-OM2 in 03/2001, PTCA-OM2 in 2003 (ISR), DES-RCA in 07/2012. Stable with no anginal symptoms. No indication for ischemic evaluation.   3. Chronic diastolic heart failure: Echocardiogram in 10/2022 showed EF 55 to 60%, normal LV function, no RWMA, G2 DD, mildly reduced RV systolic function, moderately enlarged RV, moderately elevated PASP, mild mitral valve regurgitation. Euvolemic and well compensated on exam. Continue lasix, spironolactone.   4. Hypertension: BP well controlled. Continue current antihypertensive regimen.   5. Hyperlipidemia: LDL was 60 in 08/2022. No longer on statin therapy.    6. Disposition: Follow-up in 3 to 4 months, sooner if needed.      Joylene Grapes, NP 06/13/2023, 10:36 AM

## 2023-06-13 NOTE — Patient Instructions (Addendum)
Medication Instructions:  Your physician recommends that you continue on your current medications as directed. Please refer to the Current Medication list given to you today.  *If you need a refill on your cardiac medications before your next appointment, please call your pharmacy*   Lab Work: CMET, CBC, TSH, Magnesium today  Testing/Procedures: NONE ordered at this time of appointment     Follow-Up: At [facility], you and your health needs are our priority.  As part of our continuing mission to provide you with exceptional heart care, we have created designated Provider Care Teams.  These Care Teams include your primary Cardiologist (physician) and Advanced Practice Providers (APPs -  Physician Assistants and Nurse Practitioners) who all work together to provide you with the care you need, when you need it.  We recommend signing up for the patient portal called "MyChart".  Sign up information is provided on this After Visit Summary.  MyChart is used to connect with patients for Virtual Visits (Telemedicine).  Patients are able to view lab/test results, encounter notes, upcoming appointments, etc.  Non-urgent messages can be sent to your provider as well.   To learn more about what you can do with MyChart, go to [email or web address].    Your next appointment:   3-4 month(s)  Provider:   Bryan Lemma, MD     Other Instructions For pt instructions only Amiodarone- take 200 mg daily for 1 week, then resume 100 mg daily. Lasix 40 mg -take every other day for up to 3 days.

## 2023-06-14 ENCOUNTER — Telehealth: Payer: Self-pay | Admitting: Cardiology

## 2023-06-14 LAB — CBC
Hematocrit: 36.5 % (ref 34.0–46.6)
Hemoglobin: 12 g/dL (ref 11.1–15.9)
MCH: 30.5 pg (ref 26.6–33.0)
MCHC: 32.9 g/dL (ref 31.5–35.7)
MCV: 93 fL (ref 79–97)
Platelets: 296 10*3/uL (ref 150–450)
RBC: 3.94 x10E6/uL (ref 3.77–5.28)
RDW: 15.1 % (ref 11.7–15.4)
WBC: 9.8 10*3/uL (ref 3.4–10.8)

## 2023-06-14 LAB — COMPREHENSIVE METABOLIC PANEL
ALT: 20 [IU]/L (ref 0–32)
AST: 24 [IU]/L (ref 0–40)
Albumin: 4.6 g/dL (ref 3.6–4.6)
Alkaline Phosphatase: 62 [IU]/L (ref 44–121)
BUN/Creatinine Ratio: 23 (ref 12–28)
BUN: 31 mg/dL (ref 10–36)
Bilirubin Total: 1 mg/dL (ref 0.0–1.2)
CO2: 26 mmol/L (ref 20–29)
Calcium: 10.5 mg/dL — ABNORMAL HIGH (ref 8.7–10.3)
Chloride: 95 mmol/L — ABNORMAL LOW (ref 96–106)
Creatinine, Ser: 1.32 mg/dL — ABNORMAL HIGH (ref 0.57–1.00)
Globulin, Total: 2.5 g/dL (ref 1.5–4.5)
Glucose: 157 mg/dL — ABNORMAL HIGH (ref 70–99)
Potassium: 3.7 mmol/L (ref 3.5–5.2)
Sodium: 136 mmol/L (ref 134–144)
Total Protein: 7.1 g/dL (ref 6.0–8.5)
eGFR: 38 mL/min/{1.73_m2} — ABNORMAL LOW (ref >59)

## 2023-06-14 LAB — TSH: TSH: 1.57 u[IU]/mL (ref 0.450–4.500)

## 2023-06-14 LAB — MAGNESIUM: Magnesium: 1.8 mg/dL (ref 1.6–2.3)

## 2023-06-14 NOTE — Telephone Encounter (Signed)
Patient identification verified by 2 forms. Marilynn Rail, RN    Called and spoke to patient daughter Sandi Raveling states:   -Patient had a fall this morning    -only scraped her elbow during fall   -does not have wounds anywhere else   -patient was evaluated by EMS but was told it was okay to stay home   -EMS noted her blood sugar was 191, unsure if fall related to blood sugar levels   -unsure if medication change need to be made with recent labs  [name] denies:   -head strike during fall   -AMS  Advised Beverly to follow up with PCP regarding blood sugar levels Informed [name] once results reviewed she will outreached with recommendations  Reviewed ED warning signs/precautions  [name] verbalized understanding, no questions at this time

## 2023-06-14 NOTE — Telephone Encounter (Signed)
Patient's daughter is calling because the patient fell this morning. Patient's daughter stated EMS came to access the situation and stated the patient was fine but her blood sugar levels were high (191) and would like to know based upon the lab results from yesterday, if the patient's medications should be adjusted. Please advise.

## 2023-06-14 NOTE — Telephone Encounter (Signed)
Spoke with pts daughter. She was notified of pts lab results and recommendations. Pt will continue current medication and f/u as planned.

## 2023-09-16 ENCOUNTER — Ambulatory Visit: Payer: Medicare Other | Attending: Cardiology | Admitting: Cardiology

## 2023-09-16 ENCOUNTER — Encounter: Payer: Self-pay | Admitting: Cardiology

## 2023-09-16 VITALS — BP 130/60 | HR 94 | Ht 59.0 in | Wt 144.0 lb

## 2023-09-16 DIAGNOSIS — I2510 Atherosclerotic heart disease of native coronary artery without angina pectoris: Secondary | ICD-10-CM

## 2023-09-16 DIAGNOSIS — Z9861 Coronary angioplasty status: Secondary | ICD-10-CM | POA: Diagnosis not present

## 2023-09-16 DIAGNOSIS — I10 Essential (primary) hypertension: Secondary | ICD-10-CM

## 2023-09-16 DIAGNOSIS — I5032 Chronic diastolic (congestive) heart failure: Secondary | ICD-10-CM | POA: Diagnosis not present

## 2023-09-16 DIAGNOSIS — N183 Chronic kidney disease, stage 3 unspecified: Secondary | ICD-10-CM

## 2023-09-16 DIAGNOSIS — I214 Non-ST elevation (NSTEMI) myocardial infarction: Secondary | ICD-10-CM

## 2023-09-16 DIAGNOSIS — E785 Hyperlipidemia, unspecified: Secondary | ICD-10-CM | POA: Diagnosis not present

## 2023-09-16 DIAGNOSIS — D6869 Other thrombophilia: Secondary | ICD-10-CM | POA: Diagnosis not present

## 2023-09-16 DIAGNOSIS — I480 Paroxysmal atrial fibrillation: Secondary | ICD-10-CM | POA: Diagnosis not present

## 2023-09-16 NOTE — Progress Notes (Unsigned)
 Cardiology Office Note:  .   Date:  09/20/2023  ID:  LEONORE FRANKSON, DOB June 10, 1928, MRN [id number] PCP: Thana Ates, MD   [facility] Providers Cardiologist:  Bryan Lemma, MD Cardiology APP:  Marcelino Duster, [place]     Chief Complaint  Patient presents with   Follow-up   Atrial Fibrillation    A few episodes but not really all that symptomatic.  Well managed with the amiodarone boluses and increase Lasix.   Coronary Artery Disease    No angina    Patient Profile: .     ELESA GARMAN is a  89 y.o. female  with a CVA history reviewed below who presents here for 54-month follow-up at the request of Thana Ates, MD.    Cardiology Problems    S/P NSTEMI -- involving Left Circumflex Coronary Artery (OM2) In-stent Thrombosis (Chronic)    CAD S/P PCI: OM2 PTCA 7/02, OM2 Stent 9/02, ISR 11/03 - PTCA; RCA DES 1 /14/14 - Primary (Chronic)    LHC 01/01/2001 (unstable angina): Proximal OM1 90%.  PTCA with cutting balloon to OM2. LHC 03/02/2001 (NSTEMI): Ostial OM1 30%.  Proximal OM2 95%.  PCI with Cutting Balloon angioplasty and stent placement proximal OM2. LHC 05/22/2002 (unstable angina): In-stent restenosis proximal OM2.  PTCA with Cutting Balloon OM2. LHC 07/15/2012 (unstable angina): Ostial OM1 20%.  OM2 10% in-stent restenosis.  Proximal RCA 80 to 90%.  PCI with DES to proximal RCA. Nuclear Stress Test 09/28/2014: Normal study.    Chronic diastolic CHF (congestive heart failure) (HCC) Echo 07/29/2022: EF 60 to 65%. Grade II DD. Moderate MR. Moderate MAC. Aortic valve sclerosis without stenosis. Heart rhythm during study was sinus with very frequent PACs and frequent brief episodes of A. tach.     Paroxysmal A-fib (HCC) -> Hypercoagulable state due to paroxysmal atrial fibrillation [facility]) - Jan 2024 Maintenance amiodarone 100 mg daily Plan for PRN pill in the pocket amiodarone load plus Lasix for breakthrough spells of A-fib using Kardia-Mobile to determine if she is in A-fib.     Hyperlipidemia with target LDL less than 70 (Chronic) Lipid Panel 09/26/2022: LDL 60, HDL 86, TG 140, total 170.     Essential hypertension (Chronic)    Other    Medication intolerance - Plavix, Effient, Brilinta (Chronic)      Breck Hollinger Ergle was last seen by Bernadene Person, NP on June 13, 2023 for follow-up.  She is stable overall.  There was question of A-fib on Kardia-Mobile.  She took amiodarone 200 mg twice daily for 2 days and was back taking 100 mg dosing.  (Prior to this visit she was seen in July 2024 and was started on amiodarone 100 mg daily with plans to increase amiodarone PRN breakthrough spells of A-fib.  She was doing well by virtual visit in November 2024 but contacted the office on 06/11/2023 with concern for persistent A-fib.  She doubled up her amiodarone to 200 mg twice daily and was brought back to discuss the possibility of cardioversion.  Subjective  Discussed the use of AI scribe software for clinical note transcription with the patient, who gave verbal consent to proceed.  History of Present Illness   DARRIN APODACA is a 89 year old female with atrial fibrillation, Non-STEMI/unstable angina/CAD-PCI, chronic HFpEF as well as CRF's of HTN, and HLD who presents for follow-up regarding her arrhythmia management. She is accompanied by her daughter, who is her primary caregiver.  She has a history of atrial fibrillation with ~3  breakthrough episodes since December. Her amiodarone dosage was increased at that time, and she was instructed to use furosemide as needed for dyspnea. She has experienced a few arrhythmia episodes since then, managed by taking extra doses of amiodarone, specifically two or three times since December, with the last episode requiring 200 mg daily for a week.  During arrhythmia episodes, she experiences increased fatigue and dyspnea, though she does not perceive palpitations. Her daughter uses a portable EKG device to monitor her condition and notes  increased fatigue during these episodes. She denies significant edema or orthopnea and sleeps on one pillow without nocturnal dyspnea.  She usually takes extra doses of furosemide when increasing her amiodarone dosage as recommended.    Her daughter reports easy bruising, but there have been no major bleeding issues. She denies chest pain, except for some shoulder discomfort, which she is unsure is related to her chest.  Her blood pressure remains stable, and her last lab results from December indicated good renal function and cholesterol levels. She has not been hospitalized since May of the previous year, when her medication regimen was adjusted.     Cardiovascular ROS: positive for - episodes of A-fib that are notable for more fatigue and dyspnea than baseline.  She is not very active and therefore does not necessarily notice exertional dyspnea or chest pain. negative for - chest pain, edema, irregular heartbeat, orthopnea, palpitations, paroxysmal nocturnal dyspnea, rapid heart rate, shortness of breath, or syncope or near syncope, TIA or amaurosis fugax, claudication.  Melena, hematochezia, hematuria or epistaxis.     Objective    Studies Reviewed: [name] Kitchen   EKG Interpretation Date/Time:  Monday September 16 2023 16:12:06 EDT Ventricular Rate:  94 PR Interval:    QRS Duration:  96 QT Interval:  360 QTC Calculation: 450 R Axis:   -42  Text Interpretation: Sinus rhythm with frequent Premature atrial complexes Left axis deviation Left ventricular hypertrophy ( R in aVL , Cornell product , Romhilt-Estes ) with repolarization abnormality ST elevation, consider early repolarization, pericarditis, or injury Marked ST abnormality, possible lateral subendocardial injury When compared with ECG of 13-Jun-2023 10:20, Powerline interference Confirmed by Bryan Lemma ([id number]) on 09/16/2023 4:21:01 PM   She should be due for labs from PCP soon.  Last lipids were from March 2024: TC 170, TG 140, HDL 86 and  LDL 60, A1c 5.7.  Last hemoglobin was from May 2024 was 9.2, and creatinine was 1.32 in December 2024.[name] Kitchen  No recent studies  Risk Assessment/Calculations:    CHA2DS2-VASc Score = 6   This indicates a 9.7% annual risk of stroke. The patient's score is based upon: CHF History: 1 HTN History: 1 Diabetes History: 0 Stroke History: 0 Vascular Disease History: 1 Age Score: 2 Gender Score: 1   She is on DOAC      Physical Exam:   VS:  BP 130/60   Pulse 94   Ht 4\' 11"  (1.499 m)   Wt 144 lb (65.3 kg)   SpO2 95%   BMI 29.08 kg/m    Wt Readings from Last 3 Encounters:  09/16/23 144 lb (65.3 kg)  06/13/23 144 lb 12.8 oz (65.7 kg)  05/10/23 143 lb (64.9 kg)    GEN: Healthy.  Well-nourished and well-groomed.  A&Ox3.  NAD NECK: No JVD; No carotid bruits CARDIAC: Normal S1, S2; RRR, no murmurs, rubs, gallops RESPIRATORY:  Clear to auscultation without rales, wheezing or rhonchi ; nonlabored, good air movement. ABDOMEN: Soft, non-tender, non-distended EXTREMITIES:  No edema;  No deformity      ASSESSMENT AND PLAN: .    Problem List Items Addressed This Visit       Cardiology Problems   CAD S/P PCI: OM2 PTCA 7/02, OM2 Stent 9/02, ISR 11/03 - PTCA; RCA DES 1 /14/14 (Chronic)   Last intervention was in January 2014 with DES PCI to proximal RCA.  Stent in the OM 2 was patent. Myoview in March 2016 was nonischemic. No further anginal symptoms even when in A-fib. Has been relatively stable since then with the most recent Myoview being negative in 2016. No longer having any chest pain. -Not on aspirin because of Eliquis. -Blood pressure stable on combination of Lopressor 25 mg twice daily and cardia XT 300 mg daily for combination of heart rate control and antianginal benefit => continue current meds -No longer on statin but labs are pretty well-controlled as of last year.  Due for check soon.      Chronic diastolic CHF (congestive heart failure) (HCC) (Chronic)   Really only noted  with prolonged episodes of PAF.  She is taking 60 mg of Lasix daily but increases dosage when she is in A-fib. -Continue current standing dose of Lasix 60 mg daily but increase to 80 mg for episodes of A-fib. -Remains on 12.5 mg spironolactone along with Lopressor 25 mg twice daily and Cartia XT 300 mg daily.      Essential hypertension (Chronic)   Stable BP on current dose of cardia XT 300 mg as well as Lopressor 25 mg twice daily      Relevant Orders   EKG 12-Lead (Completed)   Hypercoagulable state due to paroxysmal atrial fibrillation (HCC) (Chronic)   CHA2DS2-VASc score is at least 6.  She remains on 2.5 mg twice daily Eliquis which is appropriately dosed for age, weight and renal function.  -Okay to hold Eliquis 2 to 3 days preop for surgeries or procedures, also for significant bleeding or bruising.      Hyperlipidemia with target LDL less than 70 (Chronic)   No longer on statin.  Last set of labs were a year ago, and well-controlled.. Due for recheck. - Follow up with primary care physician for cholesterol level recheck.      Paroxysmal A-fib (HCC) - Primary (Chronic)   Breakthrough episodes managed with amiodarone. EKG shows premature atrial beats. Fatigue and dyspnea during episodes. - Continue amiodarone regimen with increased doses during episodes She is taking 100 mg daily amiodarone but for breakthrough episodes we will go to 200 mg twice daily for 2 days until out of A-fib She also takes additional 20 mg Lasix while in A-fib. - Monitor symptoms and use portable EKG for rhythm assessment.  (Using [place]) - Avoid prolonged atrial fibrillation.      Relevant Orders   EKG 12-Lead (Completed)   S/P NSTEMI -- involving left circumflex coronary artery (OM2) In-stent Thrombosis (Chronic)   Last non-STEMI was in 2002 when she had angioplasty and stent placement of the OM 2/ISR treatment.  She has had unstable angina with most recent episode being in July 21, 2012  where she had DES to the proximal RCA, prior to that she had PTCA with Cutting Balloon to the OM 2 for ISR in 2003. EF stent stable with no wall motion abnormalities on echocardiogram.  Mild diastolic heart failure but probably more related to symptomatic A-fib.        Other   Chronic kidney disease, stage III (moderate) (HCC) (Chronic)   Creatinine level consistent  with age-related changes. Avoiding prolonged atrial fibrillation is crucial. - Monitor kidney function regularly. - Avoid prolonged atrial fibrillation. -Eliquis dosing appropriate      Other Visit Diagnoses       Coronary artery disease involving native coronary artery of native heart without angina pectoris       Relevant Orders   EKG 12-Lead (Completed)        Follow-up Management plan for atrial fibrillation and heart failure is stable. - Schedule follow-up with Irving Burton in [place]:  Return in about 6 months (around 03/18/2024) for Alternate 6 month follow-up with APP & MD. - Schedule follow-up with the Dr. Herbie Baltimore in March next year (either 6 months or 1 year out). - Consider alternating yearly follow-ups between [facility] and Dr. Herbie Baltimore if stable.    Signed, Marykay Lex, MD, MS Bryan Lemma, M.D., M.S. Interventional Cardiologist  [facility]  Pager # [phone] Phone # [phone] [address] [place], [place] [address]

## 2023-09-16 NOTE — Patient Instructions (Addendum)
 Medication Instructions:   No chnages *If you need a refill on your cardiac medications before your next appointment, please call your pharmacy*   Lab Work: No changes    Testing/Procedures:  Not needed  Follow-Up: At [facility], you and your health needs are our priority.  As part of our continuing mission to provide you with exceptional heart care, we have created designated Provider Care Teams.  These Care Teams include your primary Cardiologist (physician) and Advanced Practice Providers (APPs -  Physician Assistants and Nurse Practitioners) who all work together to provide you with the care you need, when you need it.     Your next appointment:   6 month(s)  The format for your next appointment:   In Person  Provider:   Bernadene Person, NP    Then, Bryan Lemma, MD will plan to see you again in 12 month(s).

## 2023-09-20 ENCOUNTER — Encounter: Payer: Self-pay | Admitting: Cardiology

## 2023-09-20 NOTE — Assessment & Plan Note (Signed)
 No longer on statin.  Last set of labs were a year ago, and well-controlled.. Due for recheck. - Follow up with primary care physician for cholesterol level recheck.

## 2023-09-20 NOTE — Assessment & Plan Note (Signed)
 CHA2DS2-VASc score is at least 6.  She remains on 2.5 mg twice daily Eliquis which is appropriately dosed for age, weight and renal function.  -Okay to hold Eliquis 2 to 3 days preop for surgeries or procedures, also for significant bleeding or bruising.

## 2023-09-20 NOTE — Assessment & Plan Note (Signed)
 Creatinine level consistent with age-related changes. Avoiding prolonged atrial fibrillation is crucial. - Monitor kidney function regularly. - Avoid prolonged atrial fibrillation. -Eliquis dosing appropriate

## 2023-09-20 NOTE — Assessment & Plan Note (Signed)
 Really only noted with prolonged episodes of PAF.  She is taking 60 mg of Lasix daily but increases dosage when she is in A-fib. -Continue current standing dose of Lasix 60 mg daily but increase to 80 mg for episodes of A-fib. -Remains on 12.5 mg spironolactone along with Lopressor 25 mg twice daily and Cartia XT 300 mg daily.

## 2023-09-20 NOTE — Assessment & Plan Note (Signed)
 Breakthrough episodes managed with amiodarone. EKG shows premature atrial beats. Fatigue and dyspnea during episodes. - Continue amiodarone regimen with increased doses during episodes She is taking 100 mg daily amiodarone but for breakthrough episodes we will go to 200 mg twice daily for 2 days until out of A-fib She also takes additional 20 mg Lasix while in A-fib. - Monitor symptoms and use portable EKG for rhythm assessment.  (Using [place]) - Avoid prolonged atrial fibrillation.

## 2023-09-20 NOTE — Assessment & Plan Note (Signed)
 Last intervention was in January 2014 with DES PCI to proximal RCA.  Stent in the OM 2 was patent. Myoview in March 2016 was nonischemic. No further anginal symptoms even when in A-fib. Has been relatively stable since then with the most recent Myoview being negative in 2016. No longer having any chest pain. -Not on aspirin because of Eliquis. -Blood pressure stable on combination of Lopressor 25 mg twice daily and cardia XT 300 mg daily for combination of heart rate control and antianginal benefit => continue current meds -No longer on statin but labs are pretty well-controlled as of last year.  Due for check soon.

## 2023-09-20 NOTE — Assessment & Plan Note (Signed)
 Stable BP on current dose of cardia XT 300 mg as well as Lopressor 25 mg twice daily

## 2023-09-20 NOTE — Assessment & Plan Note (Addendum)
 Last non-STEMI was in 2002 when she had angioplasty and stent placement of the OM 2/ISR treatment.  She has had unstable angina with most recent episode being in July 21, 2012 where she had DES to the proximal RCA, prior to that she had PTCA with Cutting Balloon to the OM 2 for ISR in 2003. EF stent stable with no wall motion abnormalities on echocardiogram.  Mild diastolic heart failure but probably more related to symptomatic A-fib.

## 2023-09-30 DIAGNOSIS — J4540 Moderate persistent asthma, uncomplicated: Secondary | ICD-10-CM | POA: Diagnosis not present

## 2023-09-30 DIAGNOSIS — Z0000 Encounter for general adult medical examination without abnormal findings: Secondary | ICD-10-CM | POA: Diagnosis not present

## 2023-09-30 DIAGNOSIS — I480 Paroxysmal atrial fibrillation: Secondary | ICD-10-CM | POA: Diagnosis not present

## 2023-09-30 DIAGNOSIS — I2510 Atherosclerotic heart disease of native coronary artery without angina pectoris: Secondary | ICD-10-CM | POA: Diagnosis not present

## 2023-09-30 DIAGNOSIS — R739 Hyperglycemia, unspecified: Secondary | ICD-10-CM | POA: Diagnosis not present

## 2023-09-30 DIAGNOSIS — N1831 Chronic kidney disease, stage 3a: Secondary | ICD-10-CM | POA: Diagnosis not present

## 2023-09-30 DIAGNOSIS — Z79899 Other long term (current) drug therapy: Secondary | ICD-10-CM | POA: Diagnosis not present

## 2023-09-30 DIAGNOSIS — R350 Frequency of micturition: Secondary | ICD-10-CM | POA: Diagnosis not present

## 2023-09-30 DIAGNOSIS — I129 Hypertensive chronic kidney disease with stage 1 through stage 4 chronic kidney disease, or unspecified chronic kidney disease: Secondary | ICD-10-CM | POA: Diagnosis not present

## 2023-09-30 DIAGNOSIS — H6123 Impacted cerumen, bilateral: Secondary | ICD-10-CM | POA: Diagnosis not present

## 2023-09-30 DIAGNOSIS — I5032 Chronic diastolic (congestive) heart failure: Secondary | ICD-10-CM | POA: Diagnosis not present

## 2023-10-11 DIAGNOSIS — R300 Dysuria: Secondary | ICD-10-CM | POA: Diagnosis not present

## 2024-02-19 DIAGNOSIS — I4891 Unspecified atrial fibrillation: Secondary | ICD-10-CM | POA: Diagnosis not present

## 2024-02-19 DIAGNOSIS — Z85038 Personal history of other malignant neoplasm of large intestine: Secondary | ICD-10-CM | POA: Diagnosis not present

## 2024-02-19 DIAGNOSIS — I2510 Atherosclerotic heart disease of native coronary artery without angina pectoris: Secondary | ICD-10-CM | POA: Diagnosis not present

## 2024-02-19 DIAGNOSIS — Z7901 Long term (current) use of anticoagulants: Secondary | ICD-10-CM | POA: Diagnosis not present

## 2024-02-19 DIAGNOSIS — E785 Hyperlipidemia, unspecified: Secondary | ICD-10-CM | POA: Diagnosis not present

## 2024-02-19 DIAGNOSIS — J9611 Chronic respiratory failure with hypoxia: Secondary | ICD-10-CM | POA: Diagnosis not present

## 2024-02-19 DIAGNOSIS — I110 Hypertensive heart disease with heart failure: Secondary | ICD-10-CM | POA: Diagnosis not present

## 2024-02-19 DIAGNOSIS — I5032 Chronic diastolic (congestive) heart failure: Secondary | ICD-10-CM | POA: Diagnosis not present

## 2024-03-05 ENCOUNTER — Encounter: Payer: Self-pay | Admitting: Nurse Practitioner

## 2024-03-11 DIAGNOSIS — G479 Sleep disorder, unspecified: Secondary | ICD-10-CM | POA: Diagnosis not present

## 2024-03-11 DIAGNOSIS — R350 Frequency of micturition: Secondary | ICD-10-CM | POA: Diagnosis not present

## 2024-03-11 DIAGNOSIS — E441 Mild protein-calorie malnutrition: Secondary | ICD-10-CM | POA: Diagnosis not present

## 2024-06-21 ENCOUNTER — Other Ambulatory Visit: Payer: Self-pay | Admitting: Cardiology

## 2024-06-22 MED ORDER — AMIODARONE HCL 200 MG PO TABS: 100.0000 mg | ORAL_TABLET | Freq: Every day | ORAL | 0 refills | Status: AC

## 2024-08-02 DEATH — deceased
